# Patient Record
Sex: Female | Born: 1950 | Race: White | Hispanic: Yes | Smoking: Former smoker
Health system: Southern US, Community
[De-identification: ages and names within clinical notes are randomized; demographics above are authoritative.]

## PROBLEM LIST (undated history)

## (undated) DIAGNOSIS — M1990 Unspecified osteoarthritis, unspecified site: Secondary | ICD-10-CM

## (undated) HISTORY — PX: BREAST SURGERY: SHX581

## (undated) HISTORY — PX: TUBAL LIGATION: SHX77

## (undated) MED FILL — Fam-Trastuzumab Deruxtecan-nxki For IV Soln 100 MG: INTRAVENOUS | Qty: 12 | Status: AC

## (undated) MED FILL — Dexamethasone Sodium Phosphate Inj 100 MG/10ML: INTRAMUSCULAR | Qty: 1 | Status: AC

## (undated) MED FILL — Fam-Trastuzumab Deruxtecan-nxki For IV Soln 100 MG: INTRAVENOUS | Qty: 15 | Status: AC

## (undated) MED FILL — Ferric Carboxymaltose IV Soln 750 MG/15ML (Fe Equivalent): INTRAVENOUS | Qty: 15 | Status: AC

---

## 2013-10-29 DIAGNOSIS — C3490 Malignant neoplasm of unspecified part of unspecified bronchus or lung: Secondary | ICD-10-CM

## 2013-10-29 HISTORY — PX: BREAST LUMPECTOMY: SHX2

## 2013-10-29 HISTORY — DX: Malignant neoplasm of unspecified part of unspecified bronchus or lung: C34.90

## 2015-08-04 DIAGNOSIS — C7802 Secondary malignant neoplasm of left lung: Secondary | ICD-10-CM

## 2015-08-04 DIAGNOSIS — C7801 Secondary malignant neoplasm of right lung: Secondary | ICD-10-CM

## 2015-08-04 DIAGNOSIS — C50919 Malignant neoplasm of unspecified site of unspecified female breast: Secondary | ICD-10-CM

## 2015-08-25 DIAGNOSIS — C7800 Secondary malignant neoplasm of unspecified lung: Secondary | ICD-10-CM

## 2015-08-25 DIAGNOSIS — C50919 Malignant neoplasm of unspecified site of unspecified female breast: Secondary | ICD-10-CM

## 2015-09-15 DIAGNOSIS — C7800 Secondary malignant neoplasm of unspecified lung: Secondary | ICD-10-CM

## 2015-09-15 DIAGNOSIS — C50919 Malignant neoplasm of unspecified site of unspecified female breast: Secondary | ICD-10-CM

## 2015-09-15 DIAGNOSIS — Z170 Estrogen receptor positive status [ER+]: Secondary | ICD-10-CM

## 2015-10-13 DIAGNOSIS — C7800 Secondary malignant neoplasm of unspecified lung: Secondary | ICD-10-CM

## 2015-10-13 DIAGNOSIS — C50919 Malignant neoplasm of unspecified site of unspecified female breast: Secondary | ICD-10-CM

## 2015-11-24 DIAGNOSIS — C7800 Secondary malignant neoplasm of unspecified lung: Secondary | ICD-10-CM

## 2015-11-24 DIAGNOSIS — R05 Cough: Secondary | ICD-10-CM

## 2015-11-24 DIAGNOSIS — C50412 Malignant neoplasm of upper-outer quadrant of left female breast: Secondary | ICD-10-CM

## 2015-12-15 DIAGNOSIS — C50919 Malignant neoplasm of unspecified site of unspecified female breast: Secondary | ICD-10-CM

## 2015-12-15 DIAGNOSIS — Z170 Estrogen receptor positive status [ER+]: Secondary | ICD-10-CM

## 2015-12-15 DIAGNOSIS — C7800 Secondary malignant neoplasm of unspecified lung: Secondary | ICD-10-CM

## 2016-01-05 DIAGNOSIS — C50919 Malignant neoplasm of unspecified site of unspecified female breast: Secondary | ICD-10-CM

## 2016-01-05 DIAGNOSIS — C7800 Secondary malignant neoplasm of unspecified lung: Secondary | ICD-10-CM

## 2016-01-26 DIAGNOSIS — C7801 Secondary malignant neoplasm of right lung: Secondary | ICD-10-CM

## 2016-01-26 DIAGNOSIS — C7802 Secondary malignant neoplasm of left lung: Secondary | ICD-10-CM

## 2016-01-26 DIAGNOSIS — F329 Major depressive disorder, single episode, unspecified: Secondary | ICD-10-CM

## 2016-01-26 DIAGNOSIS — M545 Low back pain: Secondary | ICD-10-CM

## 2016-01-26 DIAGNOSIS — C50411 Malignant neoplasm of upper-outer quadrant of right female breast: Secondary | ICD-10-CM

## 2016-03-15 DIAGNOSIS — C50919 Malignant neoplasm of unspecified site of unspecified female breast: Secondary | ICD-10-CM

## 2016-03-15 DIAGNOSIS — C7800 Secondary malignant neoplasm of unspecified lung: Secondary | ICD-10-CM

## 2016-04-05 DIAGNOSIS — C50919 Malignant neoplasm of unspecified site of unspecified female breast: Secondary | ICD-10-CM

## 2016-04-05 DIAGNOSIS — R51 Headache: Secondary | ICD-10-CM

## 2016-05-17 DIAGNOSIS — C50412 Malignant neoplasm of upper-outer quadrant of left female breast: Secondary | ICD-10-CM

## 2016-05-17 DIAGNOSIS — C7800 Secondary malignant neoplasm of unspecified lung: Secondary | ICD-10-CM

## 2016-06-07 DIAGNOSIS — C7801 Secondary malignant neoplasm of right lung: Secondary | ICD-10-CM

## 2016-06-07 DIAGNOSIS — C50919 Malignant neoplasm of unspecified site of unspecified female breast: Secondary | ICD-10-CM

## 2016-06-07 DIAGNOSIS — C7802 Secondary malignant neoplasm of left lung: Secondary | ICD-10-CM

## 2016-07-05 DIAGNOSIS — C7801 Secondary malignant neoplasm of right lung: Secondary | ICD-10-CM

## 2016-07-05 DIAGNOSIS — C7802 Secondary malignant neoplasm of left lung: Secondary | ICD-10-CM

## 2016-07-05 DIAGNOSIS — C50412 Malignant neoplasm of upper-outer quadrant of left female breast: Secondary | ICD-10-CM

## 2016-07-05 DIAGNOSIS — D509 Iron deficiency anemia, unspecified: Secondary | ICD-10-CM

## 2016-08-16 DIAGNOSIS — C7801 Secondary malignant neoplasm of right lung: Secondary | ICD-10-CM

## 2016-08-16 DIAGNOSIS — C50412 Malignant neoplasm of upper-outer quadrant of left female breast: Secondary | ICD-10-CM

## 2016-08-16 DIAGNOSIS — C7802 Secondary malignant neoplasm of left lung: Secondary | ICD-10-CM

## 2016-09-06 DIAGNOSIS — C7802 Secondary malignant neoplasm of left lung: Secondary | ICD-10-CM

## 2016-09-06 DIAGNOSIS — C7801 Secondary malignant neoplasm of right lung: Secondary | ICD-10-CM

## 2016-09-06 DIAGNOSIS — C50412 Malignant neoplasm of upper-outer quadrant of left female breast: Secondary | ICD-10-CM

## 2016-10-04 DIAGNOSIS — C50412 Malignant neoplasm of upper-outer quadrant of left female breast: Secondary | ICD-10-CM

## 2016-10-04 DIAGNOSIS — C7802 Secondary malignant neoplasm of left lung: Secondary | ICD-10-CM

## 2016-10-04 DIAGNOSIS — C7801 Secondary malignant neoplasm of right lung: Secondary | ICD-10-CM

## 2016-10-25 DIAGNOSIS — C50412 Malignant neoplasm of upper-outer quadrant of left female breast: Secondary | ICD-10-CM

## 2016-10-25 DIAGNOSIS — C7801 Secondary malignant neoplasm of right lung: Secondary | ICD-10-CM

## 2016-10-25 DIAGNOSIS — C7802 Secondary malignant neoplasm of left lung: Secondary | ICD-10-CM

## 2016-11-23 DIAGNOSIS — C7801 Secondary malignant neoplasm of right lung: Secondary | ICD-10-CM

## 2016-11-23 DIAGNOSIS — C7802 Secondary malignant neoplasm of left lung: Secondary | ICD-10-CM

## 2016-11-23 DIAGNOSIS — C50919 Malignant neoplasm of unspecified site of unspecified female breast: Secondary | ICD-10-CM

## 2016-12-14 DIAGNOSIS — C7800 Secondary malignant neoplasm of unspecified lung: Secondary | ICD-10-CM

## 2016-12-14 DIAGNOSIS — G4700 Insomnia, unspecified: Secondary | ICD-10-CM

## 2016-12-14 DIAGNOSIS — C50919 Malignant neoplasm of unspecified site of unspecified female breast: Secondary | ICD-10-CM

## 2017-01-04 DIAGNOSIS — C50919 Malignant neoplasm of unspecified site of unspecified female breast: Secondary | ICD-10-CM

## 2017-01-04 DIAGNOSIS — Z170 Estrogen receptor positive status [ER+]: Secondary | ICD-10-CM

## 2017-01-04 DIAGNOSIS — C7800 Secondary malignant neoplasm of unspecified lung: Secondary | ICD-10-CM

## 2017-01-24 DIAGNOSIS — C7800 Secondary malignant neoplasm of unspecified lung: Secondary | ICD-10-CM

## 2017-01-24 DIAGNOSIS — C50919 Malignant neoplasm of unspecified site of unspecified female breast: Secondary | ICD-10-CM

## 2017-02-15 DIAGNOSIS — C50919 Malignant neoplasm of unspecified site of unspecified female breast: Secondary | ICD-10-CM

## 2017-02-15 DIAGNOSIS — C7800 Secondary malignant neoplasm of unspecified lung: Secondary | ICD-10-CM

## 2017-03-08 DIAGNOSIS — C7800 Secondary malignant neoplasm of unspecified lung: Secondary | ICD-10-CM

## 2017-03-08 DIAGNOSIS — Z170 Estrogen receptor positive status [ER+]: Secondary | ICD-10-CM

## 2017-03-08 DIAGNOSIS — C50412 Malignant neoplasm of upper-outer quadrant of left female breast: Secondary | ICD-10-CM

## 2017-04-26 DIAGNOSIS — Z170 Estrogen receptor positive status [ER+]: Secondary | ICD-10-CM

## 2017-04-26 DIAGNOSIS — C50412 Malignant neoplasm of upper-outer quadrant of left female breast: Secondary | ICD-10-CM

## 2017-04-26 DIAGNOSIS — C7800 Secondary malignant neoplasm of unspecified lung: Secondary | ICD-10-CM

## 2017-05-17 DIAGNOSIS — C50412 Malignant neoplasm of upper-outer quadrant of left female breast: Secondary | ICD-10-CM

## 2017-05-17 DIAGNOSIS — Z170 Estrogen receptor positive status [ER+]: Secondary | ICD-10-CM

## 2017-05-17 DIAGNOSIS — C7800 Secondary malignant neoplasm of unspecified lung: Secondary | ICD-10-CM

## 2017-07-19 DIAGNOSIS — Z170 Estrogen receptor positive status [ER+]: Secondary | ICD-10-CM

## 2017-07-19 DIAGNOSIS — C50412 Malignant neoplasm of upper-outer quadrant of left female breast: Secondary | ICD-10-CM

## 2017-07-19 DIAGNOSIS — C7800 Secondary malignant neoplasm of unspecified lung: Secondary | ICD-10-CM

## 2017-07-30 DIAGNOSIS — C50412 Malignant neoplasm of upper-outer quadrant of left female breast: Secondary | ICD-10-CM

## 2017-08-09 DIAGNOSIS — C7800 Secondary malignant neoplasm of unspecified lung: Secondary | ICD-10-CM

## 2017-08-09 DIAGNOSIS — C50412 Malignant neoplasm of upper-outer quadrant of left female breast: Secondary | ICD-10-CM

## 2017-09-06 DIAGNOSIS — C50919 Malignant neoplasm of unspecified site of unspecified female breast: Secondary | ICD-10-CM

## 2017-09-06 DIAGNOSIS — C7800 Secondary malignant neoplasm of unspecified lung: Secondary | ICD-10-CM

## 2017-10-04 DIAGNOSIS — C50919 Malignant neoplasm of unspecified site of unspecified female breast: Secondary | ICD-10-CM

## 2017-10-04 DIAGNOSIS — C7800 Secondary malignant neoplasm of unspecified lung: Secondary | ICD-10-CM

## 2017-10-25 DIAGNOSIS — C7800 Secondary malignant neoplasm of unspecified lung: Secondary | ICD-10-CM

## 2017-10-25 DIAGNOSIS — C50919 Malignant neoplasm of unspecified site of unspecified female breast: Secondary | ICD-10-CM

## 2017-11-06 DIAGNOSIS — I517 Cardiomegaly: Secondary | ICD-10-CM

## 2017-11-06 DIAGNOSIS — C50412 Malignant neoplasm of upper-outer quadrant of left female breast: Secondary | ICD-10-CM

## 2017-11-22 DIAGNOSIS — C50919 Malignant neoplasm of unspecified site of unspecified female breast: Secondary | ICD-10-CM

## 2017-12-13 DIAGNOSIS — C50919 Malignant neoplasm of unspecified site of unspecified female breast: Secondary | ICD-10-CM

## 2017-12-13 DIAGNOSIS — C7800 Secondary malignant neoplasm of unspecified lung: Secondary | ICD-10-CM

## 2018-01-03 DIAGNOSIS — C50919 Malignant neoplasm of unspecified site of unspecified female breast: Secondary | ICD-10-CM

## 2018-01-03 DIAGNOSIS — C7800 Secondary malignant neoplasm of unspecified lung: Secondary | ICD-10-CM

## 2018-01-31 DIAGNOSIS — C7800 Secondary malignant neoplasm of unspecified lung: Secondary | ICD-10-CM

## 2018-01-31 DIAGNOSIS — C50919 Malignant neoplasm of unspecified site of unspecified female breast: Secondary | ICD-10-CM

## 2018-02-06 DIAGNOSIS — I517 Cardiomegaly: Secondary | ICD-10-CM

## 2018-02-06 DIAGNOSIS — C50412 Malignant neoplasm of upper-outer quadrant of left female breast: Secondary | ICD-10-CM

## 2018-02-21 DIAGNOSIS — C7800 Secondary malignant neoplasm of unspecified lung: Secondary | ICD-10-CM

## 2018-02-21 DIAGNOSIS — C50919 Malignant neoplasm of unspecified site of unspecified female breast: Secondary | ICD-10-CM

## 2018-02-21 DIAGNOSIS — R590 Localized enlarged lymph nodes: Secondary | ICD-10-CM

## 2018-04-04 DIAGNOSIS — C7800 Secondary malignant neoplasm of unspecified lung: Secondary | ICD-10-CM

## 2018-04-04 DIAGNOSIS — C50919 Malignant neoplasm of unspecified site of unspecified female breast: Secondary | ICD-10-CM

## 2018-04-25 DIAGNOSIS — C50919 Malignant neoplasm of unspecified site of unspecified female breast: Secondary | ICD-10-CM

## 2018-04-25 DIAGNOSIS — C7800 Secondary malignant neoplasm of unspecified lung: Secondary | ICD-10-CM

## 2018-05-08 DIAGNOSIS — C50412 Malignant neoplasm of upper-outer quadrant of left female breast: Secondary | ICD-10-CM

## 2018-05-08 DIAGNOSIS — I517 Cardiomegaly: Secondary | ICD-10-CM

## 2018-05-16 DIAGNOSIS — C50919 Malignant neoplasm of unspecified site of unspecified female breast: Secondary | ICD-10-CM

## 2018-05-16 DIAGNOSIS — C7800 Secondary malignant neoplasm of unspecified lung: Secondary | ICD-10-CM

## 2018-06-06 DIAGNOSIS — C7800 Secondary malignant neoplasm of unspecified lung: Secondary | ICD-10-CM

## 2018-06-06 DIAGNOSIS — C50919 Malignant neoplasm of unspecified site of unspecified female breast: Secondary | ICD-10-CM

## 2018-06-27 DIAGNOSIS — C7800 Secondary malignant neoplasm of unspecified lung: Secondary | ICD-10-CM

## 2018-06-27 DIAGNOSIS — C50919 Malignant neoplasm of unspecified site of unspecified female breast: Secondary | ICD-10-CM

## 2018-08-04 DIAGNOSIS — C50412 Malignant neoplasm of upper-outer quadrant of left female breast: Secondary | ICD-10-CM

## 2018-08-29 DIAGNOSIS — C7800 Secondary malignant neoplasm of unspecified lung: Secondary | ICD-10-CM

## 2018-08-29 DIAGNOSIS — C50919 Malignant neoplasm of unspecified site of unspecified female breast: Secondary | ICD-10-CM

## 2018-09-19 DIAGNOSIS — C7800 Secondary malignant neoplasm of unspecified lung: Secondary | ICD-10-CM

## 2018-09-19 DIAGNOSIS — R590 Localized enlarged lymph nodes: Secondary | ICD-10-CM

## 2018-09-19 DIAGNOSIS — C50919 Malignant neoplasm of unspecified site of unspecified female breast: Secondary | ICD-10-CM

## 2018-10-20 DIAGNOSIS — C771 Secondary and unspecified malignant neoplasm of intrathoracic lymph nodes: Secondary | ICD-10-CM

## 2018-10-20 DIAGNOSIS — C50919 Malignant neoplasm of unspecified site of unspecified female breast: Secondary | ICD-10-CM

## 2018-11-14 DIAGNOSIS — C50919 Malignant neoplasm of unspecified site of unspecified female breast: Secondary | ICD-10-CM

## 2018-11-14 DIAGNOSIS — C771 Secondary and unspecified malignant neoplasm of intrathoracic lymph nodes: Secondary | ICD-10-CM

## 2018-11-14 DIAGNOSIS — L299 Pruritus, unspecified: Secondary | ICD-10-CM

## 2018-11-28 DIAGNOSIS — I361 Nonrheumatic tricuspid (valve) insufficiency: Secondary | ICD-10-CM

## 2018-11-28 DIAGNOSIS — I340 Nonrheumatic mitral (valve) insufficiency: Secondary | ICD-10-CM

## 2018-12-05 DIAGNOSIS — C50919 Malignant neoplasm of unspecified site of unspecified female breast: Secondary | ICD-10-CM

## 2018-12-05 DIAGNOSIS — N390 Urinary tract infection, site not specified: Secondary | ICD-10-CM

## 2018-12-05 DIAGNOSIS — K117 Disturbances of salivary secretion: Secondary | ICD-10-CM

## 2018-12-05 DIAGNOSIS — L299 Pruritus, unspecified: Secondary | ICD-10-CM

## 2018-12-05 DIAGNOSIS — C779 Secondary and unspecified malignant neoplasm of lymph node, unspecified: Secondary | ICD-10-CM

## 2018-12-26 DIAGNOSIS — C50919 Malignant neoplasm of unspecified site of unspecified female breast: Secondary | ICD-10-CM

## 2018-12-26 DIAGNOSIS — C779 Secondary and unspecified malignant neoplasm of lymph node, unspecified: Secondary | ICD-10-CM

## 2019-01-02 DIAGNOSIS — C778 Secondary and unspecified malignant neoplasm of lymph nodes of multiple regions: Secondary | ICD-10-CM

## 2019-01-02 DIAGNOSIS — C50919 Malignant neoplasm of unspecified site of unspecified female breast: Secondary | ICD-10-CM

## 2019-01-30 DIAGNOSIS — C50919 Malignant neoplasm of unspecified site of unspecified female breast: Secondary | ICD-10-CM

## 2019-01-30 DIAGNOSIS — C778 Secondary and unspecified malignant neoplasm of lymph nodes of multiple regions: Secondary | ICD-10-CM

## 2019-02-20 DIAGNOSIS — C778 Secondary and unspecified malignant neoplasm of lymph nodes of multiple regions: Secondary | ICD-10-CM

## 2019-02-20 DIAGNOSIS — C50919 Malignant neoplasm of unspecified site of unspecified female breast: Secondary | ICD-10-CM

## 2019-03-13 DIAGNOSIS — C50919 Malignant neoplasm of unspecified site of unspecified female breast: Secondary | ICD-10-CM

## 2019-03-13 DIAGNOSIS — C778 Secondary and unspecified malignant neoplasm of lymph nodes of multiple regions: Secondary | ICD-10-CM

## 2019-03-26 DIAGNOSIS — C50412 Malignant neoplasm of upper-outer quadrant of left female breast: Secondary | ICD-10-CM

## 2019-04-03 DIAGNOSIS — C50919 Malignant neoplasm of unspecified site of unspecified female breast: Secondary | ICD-10-CM

## 2019-04-03 DIAGNOSIS — C778 Secondary and unspecified malignant neoplasm of lymph nodes of multiple regions: Secondary | ICD-10-CM

## 2019-04-24 DIAGNOSIS — C778 Secondary and unspecified malignant neoplasm of lymph nodes of multiple regions: Secondary | ICD-10-CM

## 2019-04-24 DIAGNOSIS — C50919 Malignant neoplasm of unspecified site of unspecified female breast: Secondary | ICD-10-CM

## 2019-05-15 DIAGNOSIS — C50412 Malignant neoplasm of upper-outer quadrant of left female breast: Secondary | ICD-10-CM

## 2019-06-05 DIAGNOSIS — C7801 Secondary malignant neoplasm of right lung: Secondary | ICD-10-CM

## 2019-06-05 DIAGNOSIS — C7802 Secondary malignant neoplasm of left lung: Secondary | ICD-10-CM

## 2019-06-05 DIAGNOSIS — C50412 Malignant neoplasm of upper-outer quadrant of left female breast: Secondary | ICD-10-CM

## 2019-06-19 DIAGNOSIS — I361 Nonrheumatic tricuspid (valve) insufficiency: Secondary | ICD-10-CM

## 2019-06-26 DIAGNOSIS — C7802 Secondary malignant neoplasm of left lung: Secondary | ICD-10-CM

## 2019-06-26 DIAGNOSIS — C7801 Secondary malignant neoplasm of right lung: Secondary | ICD-10-CM

## 2019-06-26 DIAGNOSIS — C50412 Malignant neoplasm of upper-outer quadrant of left female breast: Secondary | ICD-10-CM

## 2019-07-17 DIAGNOSIS — C7801 Secondary malignant neoplasm of right lung: Secondary | ICD-10-CM

## 2019-07-17 DIAGNOSIS — C50412 Malignant neoplasm of upper-outer quadrant of left female breast: Secondary | ICD-10-CM

## 2019-07-17 DIAGNOSIS — C7802 Secondary malignant neoplasm of left lung: Secondary | ICD-10-CM

## 2019-08-07 DIAGNOSIS — C50412 Malignant neoplasm of upper-outer quadrant of left female breast: Secondary | ICD-10-CM

## 2019-08-28 DIAGNOSIS — C50412 Malignant neoplasm of upper-outer quadrant of left female breast: Secondary | ICD-10-CM

## 2019-08-28 DIAGNOSIS — C7800 Secondary malignant neoplasm of unspecified lung: Secondary | ICD-10-CM

## 2019-10-06 DIAGNOSIS — C50412 Malignant neoplasm of upper-outer quadrant of left female breast: Secondary | ICD-10-CM

## 2019-10-09 DIAGNOSIS — C50412 Malignant neoplasm of upper-outer quadrant of left female breast: Secondary | ICD-10-CM

## 2019-10-09 DIAGNOSIS — C7800 Secondary malignant neoplasm of unspecified lung: Secondary | ICD-10-CM

## 2019-11-20 DIAGNOSIS — C50412 Malignant neoplasm of upper-outer quadrant of left female breast: Secondary | ICD-10-CM

## 2019-12-11 DIAGNOSIS — C50412 Malignant neoplasm of upper-outer quadrant of left female breast: Secondary | ICD-10-CM

## 2019-12-11 DIAGNOSIS — C7801 Secondary malignant neoplasm of right lung: Secondary | ICD-10-CM

## 2019-12-11 DIAGNOSIS — C7802 Secondary malignant neoplasm of left lung: Secondary | ICD-10-CM

## 2020-01-01 DIAGNOSIS — C7802 Secondary malignant neoplasm of left lung: Secondary | ICD-10-CM

## 2020-01-01 DIAGNOSIS — C50412 Malignant neoplasm of upper-outer quadrant of left female breast: Secondary | ICD-10-CM

## 2020-01-01 DIAGNOSIS — C7801 Secondary malignant neoplasm of right lung: Secondary | ICD-10-CM

## 2020-02-09 DIAGNOSIS — C50412 Malignant neoplasm of upper-outer quadrant of left female breast: Secondary | ICD-10-CM

## 2020-02-09 DIAGNOSIS — T451X1D Poisoning by antineoplastic and immunosuppressive drugs, accidental (unintentional), subsequent encounter: Secondary | ICD-10-CM

## 2020-02-12 DIAGNOSIS — C7801 Secondary malignant neoplasm of right lung: Secondary | ICD-10-CM

## 2020-02-12 DIAGNOSIS — C50412 Malignant neoplasm of upper-outer quadrant of left female breast: Secondary | ICD-10-CM

## 2020-02-12 DIAGNOSIS — C7802 Secondary malignant neoplasm of left lung: Secondary | ICD-10-CM

## 2020-03-10 DIAGNOSIS — C50412 Malignant neoplasm of upper-outer quadrant of left female breast: Secondary | ICD-10-CM

## 2020-06-03 DIAGNOSIS — T451X1D Poisoning by antineoplastic and immunosuppressive drugs, accidental (unintentional), subsequent encounter: Secondary | ICD-10-CM

## 2020-06-03 DIAGNOSIS — C50412 Malignant neoplasm of upper-outer quadrant of left female breast: Secondary | ICD-10-CM

## 2020-06-10 DIAGNOSIS — C50412 Malignant neoplasm of upper-outer quadrant of left female breast: Secondary | ICD-10-CM

## 2020-07-15 ENCOUNTER — Encounter: Payer: Self-pay | Admitting: Oncology

## 2020-07-15 DIAGNOSIS — C50412 Malignant neoplasm of upper-outer quadrant of left female breast: Secondary | ICD-10-CM | POA: Insufficient documentation

## 2020-08-02 NOTE — Progress Notes (Signed)
Patient has been approved for drug replacement program by [place] for [facility]. The enrollment period is from 01/09/20 to indefinitely. First DOS covered is: TBA.

## 2020-09-06 ENCOUNTER — Other Ambulatory Visit: Payer: Self-pay | Admitting: Hematology and Oncology

## 2020-09-06 DIAGNOSIS — Z170 Estrogen receptor positive status [ER+]: Secondary | ICD-10-CM

## 2020-09-06 DIAGNOSIS — C50412 Malignant neoplasm of upper-outer quadrant of left female breast: Secondary | ICD-10-CM

## 2020-09-13 ENCOUNTER — Telehealth: Payer: Self-pay | Admitting: Oncology

## 2020-09-13 NOTE — Telephone Encounter (Signed)
Patient's daughter called to CX 11/19 Labs, Follow up due to patient being out-of-town until 12/1

## 2020-09-16 ENCOUNTER — Ambulatory Visit: Payer: Self-pay | Admitting: Oncology

## 2020-09-16 ENCOUNTER — Other Ambulatory Visit: Payer: Self-pay

## 2020-10-07 ENCOUNTER — Telehealth: Payer: Self-pay | Admitting: Oncology

## 2020-10-07 NOTE — Telephone Encounter (Signed)
Patient's daughter called to Reschedule 11/19 to Nov 14, 2020 ([place] was [place]of[place])

## 2020-11-03 ENCOUNTER — Other Ambulatory Visit: Payer: Self-pay | Admitting: Oncology

## 2020-11-03 DIAGNOSIS — Z170 Estrogen receptor positive status [ER+]: Secondary | ICD-10-CM

## 2020-11-03 DIAGNOSIS — C50412 Malignant neoplasm of upper-outer quadrant of left female breast: Secondary | ICD-10-CM

## 2020-11-03 NOTE — Progress Notes (Unsigned)
[facility]  [address] [place],  [place]  [address] [phone]  Clinic Day:  11/04/2020  Referring physician: Marguerita Merles, MD   HISTORY OF PRESENT ILLNESS:  The patient is a 70 y.o. 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  She last received her 24th cycle of TDM-1 in late August 2021.  Since that time, the patient has spent the last few months out [place] with her family.  She comes back in today to re-establish her follow-up and disease surveillance.  Since being off of her TDM-1 therapy for these past few months, the patient has felt okay.  She still complains of intermittent headaches and intermittent back discomfort.  Both issues are chronic in nature, with previous scans showing no evidence of disease recurrence.  With respect to her metastatic breast cancer, she denies having any new symptoms or findings which concern her for overt signs of disease progression.   PHYSICAL EXAM:  Blood pressure 133/85, pulse 79, temperature 97.8 F (36.6 C), resp. rate 16, height 5' 6.5" (1.689 m), weight 151 lb 11.2 oz (68.8 kg), SpO2 98 %. Wt Readings from Last 3 Encounters:  11/04/20 151 lb 11.2 oz (68.8 kg)   Body mass index is 24.12 kg/m. Performance status (ECOG): 1 - Symptomatic but completely ambulatory Physical Exam Constitutional:      Appearance: Normal appearance.  HENT:     Mouth/Throat:     Pharynx: Oropharynx is clear. No oropharyngeal exudate.  Cardiovascular:     Rate and Rhythm: Normal rate and regular rhythm.     Heart sounds: No murmur heard. No friction rub. No gallop.   Pulmonary:     Breath sounds: Normal breath sounds.  Chest:  Breasts:     Right: No swelling, bleeding, inverted nipple, mass, nipple discharge, skin change, axillary adenopathy or supraclavicular adenopathy.     Left: No swelling, bleeding, inverted nipple, mass, nipple discharge, skin  change, axillary adenopathy or supraclavicular adenopathy.    Abdominal:     General: Bowel sounds are normal. There is no distension.     Palpations: Abdomen is soft. There is no mass.     Tenderness: There is no abdominal tenderness.  Musculoskeletal:        General: No tenderness.     Cervical back: Normal range of motion and neck supple.     Right lower leg: No edema.     Left lower leg: No edema.  Lymphadenopathy:     Cervical: No cervical adenopathy.     Right cervical: No superficial, deep or posterior cervical adenopathy.    Left cervical: No superficial, deep or posterior cervical adenopathy.     Upper Body:     Right upper body: No supraclavicular or axillary adenopathy.     Left upper body: No supraclavicular or axillary adenopathy.     Lower Body: No right inguinal adenopathy. No left inguinal adenopathy.  Skin:    Coloration: Skin is not jaundiced.     Findings: No lesion or rash.  Neurological:     General: No focal deficit present.     Mental Status: She is alert and oriented to person, place, and time. Mental status is at baseline.  Psychiatric:        Mood and Affect: Mood normal.        Behavior: Behavior normal.        Thought Content: Thought content normal.  Judgment: Judgment normal.     LABS:    ASSESSMENT & PLAN:  Assessment/Plan:  A 70 y.o. 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  Despite being off any form of treatment for the past 4+ months, she appears to be doing well.  I will have her undergo a chest and head CT in the forthcoming week to ascertain her new disease baseline.  I will see her back the following day to go over these images and their implications, as well as to determine if resuming therapy is necessary.  The patient understands all the plans discussed today and is in agreement with them.     Vaida Kerchner Macarthur Critchley, MD

## 2020-11-04 ENCOUNTER — Other Ambulatory Visit: Payer: Self-pay

## 2020-11-04 ENCOUNTER — Inpatient Hospital Stay: Payer: Self-pay

## 2020-11-04 ENCOUNTER — Telehealth: Payer: Self-pay | Admitting: Oncology

## 2020-11-04 ENCOUNTER — Other Ambulatory Visit: Payer: Self-pay | Admitting: Hematology and Oncology

## 2020-11-04 ENCOUNTER — Other Ambulatory Visit: Payer: Self-pay | Admitting: Oncology

## 2020-11-04 ENCOUNTER — Inpatient Hospital Stay: Payer: Self-pay | Attending: Oncology | Admitting: Oncology

## 2020-11-04 VITALS — BP 133/85 | HR 79 | Temp 97.8°F | Resp 16 | Ht 66.5 in | Wt 151.7 lb

## 2020-11-04 DIAGNOSIS — C50412 Malignant neoplasm of upper-outer quadrant of left female breast: Secondary | ICD-10-CM

## 2020-11-04 DIAGNOSIS — Z171 Estrogen receptor negative status [ER-]: Secondary | ICD-10-CM

## 2020-11-04 LAB — BASIC METABOLIC PANEL
BUN: 15 (ref 4–21)
CO2: 25 — AB (ref 13–22)
Chloride: 105 (ref 99–108)
Creatinine: 0.6 (ref 0.5–1.1)
Glucose: 99
Potassium: 4 (ref 3.4–5.3)
Sodium: 138 (ref 137–147)

## 2020-11-04 LAB — CBC AND DIFFERENTIAL
HCT: 41 (ref 36–46)
Hemoglobin: 13.7 (ref 12.0–16.0)
Neutrophils Absolute: 2.3
Platelets: 188 (ref 150–399)
WBC: 4.1

## 2020-11-04 LAB — COMPREHENSIVE METABOLIC PANEL
Albumin: 4.4 (ref 3.5–5.0)
Calcium: 10.1 (ref 8.7–10.7)

## 2020-11-04 LAB — HEPATIC FUNCTION PANEL
ALT: 43 — AB (ref 7–35)
AST: 49 — AB (ref 13–35)
Alkaline Phosphatase: 117 (ref 25–125)
Bilirubin, Total: 0.6

## 2020-11-04 LAB — CBC: RBC: 4.49 (ref 3.87–5.11)

## 2020-11-04 NOTE — Telephone Encounter (Signed)
Per 1/7 LOS, patient scheduled for 11/09/20 CT Chest/Brain - 1/14 Follow Up at 11:45 am

## 2020-11-08 ENCOUNTER — Encounter: Payer: Self-pay | Admitting: Oncology

## 2020-11-10 NOTE — Progress Notes (Signed)
[name]  [address] [place],  [place]  [address] [phone]  Clinic Day:  11/04/2020  Referring physician: Marguerita Merles, MD   HISTORY OF PRESENT ILLNESS:  The patient is a 70 y.o. 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  She last received her 24th cycle of TDM-1 in late August 2021.  Since that time, the patient has spent the last few months out [place] with her family.  She comes back in today to re-establish her follow-up and disease surveillance.  Since being off of her TDM-1 therapy for these past few months, the patient has felt okay.  She still complains of intermittent headaches and intermittent back discomfort.  Both issues are chronic in nature.  With respect to her metastatic breast cancer, she denies having any new symptoms or findings which concern her for overt signs of disease progression.  She presents to clinic today for review of recent CT imaging of the chest and head. CT chest reveals small unchanged pulmonary nodules, likely benign and borderline enlarged subcarinal and right hilar lymph nodes are slightly increased in size in the interval may reflect reactive change. These do not meet CT criteria for adenopathy. Attention on follow up imaging is advised. CT head reveals new probable cystic/ solid lesion of the left cerebellum with surrounding edema but no significant mass effect.   She denies fever, chills, nausea or vomiting. She denies cough, chest pain or shortness of breath. She denies issue with bowel or bladder.   PHYSICAL EXAM:  There were no vitals taken for this visit. Wt Readings from Last 3 Encounters:  11/04/20 151 lb 11.2 oz (68.8 kg)   There is no height or weight on file to calculate BMI. Performance status (ECOG): 1 - Symptomatic but completely ambulatory Physical Exam Constitutional:      General: She is not in acute distress.     Appearance: Normal appearance. She is normal weight. She is not ill-appearing, toxic-appearing or diaphoretic.  HENT:     Head: Normocephalic and atraumatic.     Right Ear: Tympanic membrane normal.     Left Ear: Tympanic membrane normal.     Nose: Nose normal. No congestion or rhinorrhea.     Mouth/Throat:     Mouth: Mucous membranes are moist.     Pharynx: Oropharynx is clear. No oropharyngeal exudate or posterior oropharyngeal erythema.  Eyes:     General: No scleral icterus.       Right eye: No discharge.        Left eye: No discharge.     Extraocular Movements: Extraocular movements intact.     Conjunctiva/sclera: Conjunctivae normal.     Pupils: Pupils are equal, round, and reactive to light.  Neck:     Vascular: No carotid bruit.  Cardiovascular:     Rate and Rhythm: Normal rate and regular rhythm.     Heart sounds: No murmur heard. No friction rub. No gallop.   Pulmonary:     Effort: Pulmonary effort is normal. No respiratory distress.     Breath sounds: Normal breath sounds. No stridor. No wheezing, rhonchi or rales.  Chest:     Chest wall: No tenderness.  Abdominal:     General: Abdomen is flat. Bowel sounds are normal. There is no distension.     Palpations: There is no mass.     Tenderness: There is no abdominal tenderness. There is no  right CVA tenderness, left CVA tenderness, guarding or rebound.     Hernia: No hernia is present.  Musculoskeletal:        General: No swelling, tenderness, deformity or signs of injury. Normal range of motion.     Cervical back: Normal range of motion and neck supple. No rigidity or tenderness.     Right lower leg: No edema.     Left lower leg: No edema.  Lymphadenopathy:     Cervical: No cervical adenopathy.  Skin:    General: Skin is warm and dry.     Capillary Refill: Capillary refill takes less than 2 seconds.     Coloration: Skin is not jaundiced or pale.     Findings: No bruising, erythema, lesion or rash.  Neurological:      General: No focal deficit present.     Mental Status: She is alert and oriented to person, place, and time. Mental status is at baseline.     Cranial Nerves: No cranial nerve deficit.     Sensory: No sensory deficit.     Motor: No weakness.     Coordination: Coordination normal.     Gait: Gait normal.     Deep Tendon Reflexes: Reflexes normal.  Psychiatric:        Mood and Affect: Mood normal.        Behavior: Behavior normal.        Thought Content: Thought content normal.        Judgment: Judgment normal.     LABS:    ASSESSMENT & PLAN:  Assessment/Plan:  A 70 y.o. 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 Despite being off any form of treatment for the past 4+ months, she appears to be doing well. Review of her CT imaging reveals stable disease in the chest and a probable cystic/ solid lesion of the left cerebellum. We will obtain MRI of the brain with and without contrast for further evaluation. She will return to clinic once imaging has been obtained for discussion with Dr. Bobby Rumpf regarding future planning.   The patient verbalizes understanding of and agreement to the plans discussed today. She knows to call the office with any new questions or concerns.  Melodye Ped, NP

## 2020-11-11 ENCOUNTER — Ambulatory Visit: Payer: Self-pay | Admitting: Oncology

## 2020-11-11 ENCOUNTER — Inpatient Hospital Stay (HOSPITAL_BASED_OUTPATIENT_CLINIC_OR_DEPARTMENT_OTHER): Payer: Self-pay | Admitting: Hematology and Oncology

## 2020-11-11 ENCOUNTER — Encounter: Payer: Self-pay | Admitting: Hematology and Oncology

## 2020-11-11 ENCOUNTER — Telehealth: Payer: Self-pay | Admitting: Oncology

## 2020-11-11 VITALS — BP 142/76 | HR 77 | Temp 98.4°F | Resp 16 | Ht 66.5 in | Wt 150.7 lb

## 2020-11-11 DIAGNOSIS — Z171 Estrogen receptor negative status [ER-]: Secondary | ICD-10-CM

## 2020-11-11 DIAGNOSIS — C50412 Malignant neoplasm of upper-outer quadrant of left female breast: Secondary | ICD-10-CM

## 2020-11-11 NOTE — Progress Notes (Signed)
Patient brought in her renewal paperwork for financial assistance through [facility], she also brought in an application for [facility]. I will submit both for her.

## 2020-11-28 ENCOUNTER — Ambulatory Visit: Payer: Self-pay | Admitting: Oncology

## 2020-11-28 ENCOUNTER — Other Ambulatory Visit: Payer: Self-pay

## 2021-01-12 ENCOUNTER — Encounter: Payer: Self-pay | Admitting: Hematology and Oncology

## 2021-01-15 NOTE — Progress Notes (Signed)
[name]  [address] [place],  [place]  [address] [phone]  Clinic Day:  01/16/2021  Referring physician: Marguerita Merles, MD   HISTORY OF PRESENT ILLNESS:  The patient is a 70 y.o. 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  She last received her 24th cycle of TDM-1 in late August 2021.  For multiple months, the patient spent time out [place] with her family.  When she returned earlier this year, a head CT showed a new probable cystic/ solid lesion of the left cerebellum with surrounding edema but no significant mass effect.   The plan was for her to receive a brain MRI as quickly as possible, but it did not get done until recently.  She comes in today to go over her brain MRI images and their implications.  Since her last visit, she has been doing okay.  She still has occasional headaches, but does not claim they are particularly any different or worse than previously.    PHYSICAL EXAM:  Blood pressure 132/79, pulse 85, temperature 98.4 F (36.9 C), resp. rate 14, height 5' 6.5" (1.689 m), weight 152 lb 4.8 oz (69.1 kg), SpO2 96 %. Wt Readings from Last 3 Encounters:  01/16/21 152 lb 4.8 oz (69.1 kg)  11/11/20 150 lb 11.2 oz (68.4 kg)  11/04/20 151 lb 11.2 oz (68.8 kg)   Body mass index is 24.21 kg/m. Performance status (ECOG): 1 - Symptomatic but completely ambulatory Physical Exam Constitutional:      General: She is not in acute distress.    Appearance: Normal appearance. She is normal weight. She is not ill-appearing, toxic-appearing or diaphoretic.  HENT:     Head: Normocephalic and atraumatic.     Right Ear: Tympanic membrane normal.     Left Ear: Tympanic membrane normal.     Nose: Nose normal. No congestion or rhinorrhea.     Mouth/Throat:     Mouth: Mucous membranes are moist.     Pharynx: Oropharynx is clear. No oropharyngeal exudate or posterior  oropharyngeal erythema.  Eyes:     General: No scleral icterus.       Right eye: No discharge.        Left eye: No discharge.     Extraocular Movements: Extraocular movements intact.     Conjunctiva/sclera: Conjunctivae normal.     Pupils: Pupils are equal, round, and reactive to light.  Neck:     Vascular: No carotid bruit.  Cardiovascular:     Rate and Rhythm: Normal rate and regular rhythm.     Heart sounds: No murmur heard. No friction rub. No gallop.   Pulmonary:     Effort: Pulmonary effort is normal. No respiratory distress.     Breath sounds: Normal breath sounds. No stridor. No wheezing, rhonchi or rales.  Chest:     Chest wall: No tenderness.  Abdominal:     General: Abdomen is flat. Bowel sounds are normal. There is no distension.     Palpations: There is no mass.     Tenderness: There is no abdominal tenderness. There is no right CVA tenderness, left CVA tenderness, guarding or rebound.     Hernia: No hernia is present.  Musculoskeletal:        General: No swelling, tenderness, deformity or signs of injury. Normal range of motion.     Cervical back: Normal range of motion and neck supple. No rigidity or  tenderness.     Right lower leg: No edema.     Left lower leg: No edema.  Lymphadenopathy:     Cervical: No cervical adenopathy.  Skin:    General: Skin is warm and dry.     Capillary Refill: Capillary refill takes less than 2 seconds.     Coloration: Skin is not jaundiced or pale.     Findings: No bruising, erythema, lesion or rash.  Neurological:     General: No focal deficit present.     Mental Status: She is alert and oriented to person, place, and time. Mental status is at baseline.     Cranial Nerves: No cranial nerve deficit.     Sensory: No sensory deficit.     Motor: No weakness.     Coordination: Coordination normal.     Gait: Gait normal.     Deep Tendon Reflexes: Reflexes normal.  Psychiatric:        Mood and Affect: Mood normal.        Behavior:  Behavior normal.        Thought Content: Thought content normal.        Judgment: Judgment normal.    SCANS:  Her brain MRI done last week revealed the following: FINDINGS: Brain: Left upper cerebellar mass with multiple rim enhancing cystic components and a irregular dominant nodular component superiorly. The total mass measures up to 4.5 x 3.8 x 2.7 cm. Moderate adjacent edematous appearance with narrowing of the upper fourth ventricle but no hydrocephalus at this time. No second lesion is seen.  No infarct, collection, brain atrophy.  Vascular: Normal flow voids and vascular enhancements.  Skull and upper cervical spine: No noted bony metastasis.  Sinuses/Orbits: Negative  IMPRESSION: 1. 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 2. Upper fourth ventricular narrowing without hydrocephalus at this time.  ASSESSMENT & PLAN:  Assessment/Plan:  A 70 y.o. 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  In clinic today, I went over her brain MRI images with her and her family, for which they could see the large cerebellar metastasis. I discussed her case with neurosurgery this afternoon, with the hopes they can see her and surgically resect this likely metastatic lesion.  AS there appears to be no significant edema and she has no prominent CNS symptoms, I will not place her on steroids.  Neurosurgery will see her in the forthcoming days to formulate her next course of action.  I will tentatively see her back in 6 weeks for repeat clinical assessment.  Although somewhat despondent, the patient understands all the plans discussed today and is in agreement with them.    Marquelle Musgrave Macarthur Critchley, MD

## 2021-01-16 ENCOUNTER — Other Ambulatory Visit: Payer: Self-pay

## 2021-01-16 ENCOUNTER — Inpatient Hospital Stay: Payer: Self-pay

## 2021-01-16 ENCOUNTER — Telehealth: Payer: Self-pay | Admitting: Oncology

## 2021-01-16 ENCOUNTER — Other Ambulatory Visit: Payer: Self-pay | Admitting: Oncology

## 2021-01-16 ENCOUNTER — Inpatient Hospital Stay: Payer: Self-pay | Attending: Oncology | Admitting: Oncology

## 2021-01-16 VITALS — BP 132/79 | HR 85 | Temp 98.4°F | Resp 14 | Ht 66.5 in | Wt 152.3 lb

## 2021-01-16 DIAGNOSIS — C50412 Malignant neoplasm of upper-outer quadrant of left female breast: Secondary | ICD-10-CM

## 2021-01-16 DIAGNOSIS — C7931 Secondary malignant neoplasm of brain: Secondary | ICD-10-CM

## 2021-01-16 DIAGNOSIS — Z171 Estrogen receptor negative status [ER-]: Secondary | ICD-10-CM

## 2021-01-16 DIAGNOSIS — C50911 Malignant neoplasm of unspecified site of right female breast: Secondary | ICD-10-CM

## 2021-01-16 NOTE — Telephone Encounter (Signed)
Per 3/21 LOS, patient scheduled for May Appt's.  Gave patient Appt Summary

## 2021-01-24 DIAGNOSIS — D496 Neoplasm of unspecified behavior of brain: Secondary | ICD-10-CM | POA: Diagnosis present

## 2021-01-25 ENCOUNTER — Other Ambulatory Visit: Payer: Self-pay | Admitting: Neurosurgery

## 2021-01-25 DIAGNOSIS — C50919 Malignant neoplasm of unspecified site of unspecified female breast: Secondary | ICD-10-CM | POA: Insufficient documentation

## 2021-02-01 NOTE — Pre-Procedure Instructions (Signed)
Surgical Instructions:    Your procedure is scheduled on Monday 02/06/21 (07:30 AM- 11:00 AM).  Report to [facility] Main Entrance "A" at 05:30 A.M., then check in with the Admitting office.  Call this number if you have any questions prior to, or have any problems the morning of surgery:  [phone]    Remember:  Do not eat or drink after midnight the night before your surgery.    Take these medicines the morning of surgery with A SIP OF WATER: NONE    As of today, STOP taking any Aspirin (unless otherwise instructed by your surgeon), naproxen sodium (ALEVE), Naproxen, Ibuprofen, Motrin, Advil, Goody's, BC's, all herbal medications, fish oil, and all vitamins.              Special instructions:   [facility]- Preparing For Surgery  Before surgery, you can play an important role. Because skin is not sterile, your skin needs to be as free of germs as possible. You can reduce the number of germs on your skin by washing with CHG (chlorahexidine gluconate) Soap before surgery.  CHG is an antiseptic cleaner which kills germs and bonds with the skin to continue killing germs even after washing.    Oral Hygiene is also important to reduce your risk of infection.  Remember - BRUSH YOUR TEETH THE MORNING OF SURGERY WITH YOUR REGULAR TOOTHPASTE  Please do not use if you have an allergy to CHG or antibacterial soaps. If your skin becomes reddened/irritated stop using the CHG.  Do not shave (including legs and underarms) for at least 48 hours prior to first CHG shower. It is OK to shave your face.  Please follow these instructions carefully.   1. Shower the NIGHT BEFORE SURGERY and the MORNING OF SURGERY  2. If you chose to wash your hair, wash your hair first as usual with your normal shampoo.  3. After you shampoo, rinse your hair and body thoroughly to remove the shampoo.  4. Wash Face and genitals (private parts) with your normal soap.   5. Use CHG Soap as you would any other  liquid soap. You can apply CHG directly to the skin and wash gently with a scrungie or a clean washcloth.   6. Apply the CHG Soap to your body ONLY FROM THE NECK DOWN.  Do not use on open wounds or open sores. Avoid contact with your eyes, ears, mouth and genitals (private parts). Wash Face and genitals (private parts)  with your normal soap.   7. Wash thoroughly, paying special attention to the area where your surgery will be performed.  8. Thoroughly rinse your body with warm water from the neck down.  9. DO NOT shower/wash with your normal soap after using and rinsing off the CHG Soap.  10. Pat yourself dry with a CLEAN TOWEL.  11. Wear CLEAN PAJAMAS to bed the night before surgery.  12. Place CLEAN SHEETS on your bed the night before your surgery.  13. DO NOT SLEEP WITH PETS.   Day of Surgery: SHOWER with CHG soap. Brush your teeth WITH YOUR REGULAR TOOTHPASTE. Wear Clean/Comfortable clothing the morning of surgery. Do not apply any deodorants/lotions.   Do not wear jewelry, make up, or nail polish. Do not shave 48 hours prior to surgery.    Do NOT Smoke (Tobacco/Vaping) or drink Alcohol 24 hours prior to your procedure. Do not bring valuables to the hospital. [facility] is not responsible for any belongings or valuables.  If you use  a CPAP at night, you may bring all equipment for your overnight stay.   Contacts, glasses, or dentures may not be worn into surgery, please bring cases for these belongings.   For patients admitted to the hospital, discharge time will be determined by your treatment team.   Patients discharged the day of surgery will not be allowed to drive home, and someone needs to stay with them for 24 hours.    Please read over the following fact sheets that you were given.

## 2021-02-02 ENCOUNTER — Encounter (HOSPITAL_COMMUNITY)
Admission: RE | Admit: 2021-02-02 | Discharge: 2021-02-02 | Disposition: A | Discharge status: Home / Self Care | Payer: Self-pay | Source: Ambulatory Visit | Attending: Neurosurgery | Admitting: Neurosurgery

## 2021-02-02 ENCOUNTER — Other Ambulatory Visit: Payer: Self-pay

## 2021-02-02 ENCOUNTER — Encounter (HOSPITAL_COMMUNITY): Payer: Self-pay

## 2021-02-02 DIAGNOSIS — Z01812 Encounter for preprocedural laboratory examination: Secondary | ICD-10-CM | POA: Insufficient documentation

## 2021-02-02 DIAGNOSIS — Z20822 Contact with and (suspected) exposure to covid-19: Secondary | ICD-10-CM | POA: Insufficient documentation

## 2021-02-02 HISTORY — DX: Unspecified osteoarthritis, unspecified site: M19.90

## 2021-02-02 LAB — CBC
HCT: 45 % (ref 36.0–46.0)
Hemoglobin: 14.7 g/dL (ref 12.0–15.0)
MCH: 30.6 pg (ref 26.0–34.0)
MCHC: 32.7 g/dL (ref 30.0–36.0)
MCV: 93.6 fL (ref 80.0–100.0)
Platelets: 209 10*3/uL (ref 150–400)
RBC: 4.81 MIL/uL (ref 3.87–5.11)
RDW: 13.3 % (ref 11.5–15.5)
WBC: 4.6 10*3/uL (ref 4.0–10.5)
nRBC: 0 % (ref 0.0–0.2)

## 2021-02-02 LAB — TYPE AND SCREEN
ABO/RH(D): O POS
Antibody Screen: NEGATIVE

## 2021-02-02 LAB — SARS CORONAVIRUS 2 (TAT 6-24 HRS): SARS Coronavirus 2: NEGATIVE

## 2021-02-02 NOTE — Progress Notes (Addendum)
PCP - Delight Stare, MD Cardiologist - Denies Oncologist- Lavera Guise, MD  PPM/ICD - Denies  Chest x-ray - N/A EKG - N/A Stress Test - Denies ECHO - 06/03/2020 at [facility]; record provided by daughter, [facility] Cardiac Cath - Denies  Sleep Study - Denies   Patient denies being diabetic.  Blood Thinner Instructions: N/A Aspirin Instructions: N/A  ERAS Protcol - N/A PRE-SURGERY Ensure or G2- N/A  COVID TEST- 02/02/21; results pending   Anesthesia review: Yes, review ECHO  Patient denies shortness of breath, fever, cough and chest pain at PAT appointment   All instructions explained to the patient, with a verbal understanding of the material. Patient agrees to go over the instructions while at home for a better understanding. Patient also instructed to self quarantine after being tested for COVID-19. The opportunity to ask questions was provided.

## 2021-02-03 NOTE — Anesthesia Preprocedure Evaluation (Addendum)
Anesthesia Evaluation  Patient identified by MRN, date of birth, ID band Patient awake    Reviewed: Allergy & Precautions, H&P , NPO status , Patient's Chart, lab work & pertinent test results  Airway Mallampati: II  TM Distance: >3 FB Neck ROM: Full    Dental no notable dental hx. (+) Teeth Intact, Dental Advisory Given   Pulmonary neg pulmonary ROS, former smoker,    Pulmonary exam normal breath sounds clear to auscultation       Cardiovascular negative cardio ROS   Rhythm:Regular Rate:Normal     Neuro/Psych negative neurological ROS  negative psych ROS   GI/Hepatic negative GI ROS, Neg liver ROS,   Endo/Other  negative endocrine ROS  Renal/GU negative Renal ROS  negative genitourinary   Musculoskeletal  (+) Arthritis , Osteoarthritis,    Abdominal   Peds  Hematology negative hematology ROS (+)   Anesthesia Other Findings   Reproductive/Obstetrics negative OB ROS                           Anesthesia Physical Anesthesia Plan  ASA: III  Anesthesia Plan: General   Post-op Pain Management:    Induction: Intravenous  PONV Risk Score and Plan: 4 or greater and Ondansetron, Aprepitant and Dexamethasone  Airway Management Planned: Oral ETT  Additional Equipment: Arterial line  Intra-op Plan:   Post-operative Plan: Extubation in OR  Informed Consent: I have reviewed the patients History and Physical, chart, labs and discussed the procedure including the risks, benefits and alternatives for the proposed anesthesia with the patient or authorized representative who has indicated his/her understanding and acceptance.     Interpreter used for [employer] Discussed with: CRNA and Surgeon  Anesthesia Plan Comments: (TTE 11/04/2019 (copy on chart): Conclusions: 1.  Left ventricular wall thickness is normal. 2.  Overall left ventricular systolic function is normal with an EF  between 60-65%.  Normal global left ventricular longitudinal strain (-20.7%). 3.  The diastolic filling pattern is normal for the age of the patient. 4.  The right ventricle is normal in size and function. 5.  The left atrium is normal in size. 6.  The right atrium is normal in size and function. 7.  The aortic valve is trileaflet and appears structurally normal. 8.  There is mild aortic valve sclerosis. 9.  Normal-appearing mitral valve. 10.  There is trace mitral regurgitation. 11.  Trace tricuspid regurgitation is present. 12.  Trace/mild (physiologic) pulmonic regurgitation. 13.  The aortic root, ascending aorta and aortic arch appear normal. 14.  There is no pericardial effusion.)      Anesthesia Quick Evaluation

## 2021-02-06 ENCOUNTER — Encounter (HOSPITAL_COMMUNITY)
Admission: RE | Disposition: A | Discharge status: Rehabilitation Facility | Payer: Self-pay | Source: Home / Self Care | Attending: Neurosurgery

## 2021-02-06 ENCOUNTER — Inpatient Hospital Stay (HOSPITAL_COMMUNITY): Payer: Self-pay

## 2021-02-06 ENCOUNTER — Inpatient Hospital Stay (HOSPITAL_COMMUNITY)
Admission: RE | Admit: 2021-02-06 | Discharge: 2021-02-10 | DRG: 026 | Disposition: A | Discharge status: Rehabilitation Facility | Payer: Self-pay | Attending: Neurosurgery | Admitting: Neurosurgery

## 2021-02-06 ENCOUNTER — Inpatient Hospital Stay (HOSPITAL_COMMUNITY): Payer: Self-pay | Admitting: Physician Assistant

## 2021-02-06 ENCOUNTER — Encounter (HOSPITAL_COMMUNITY): Payer: Self-pay | Admitting: Neurosurgery

## 2021-02-06 ENCOUNTER — Other Ambulatory Visit: Payer: Self-pay

## 2021-02-06 DIAGNOSIS — Z6828 Body mass index (BMI) 28.0-28.9, adult: Secondary | ICD-10-CM

## 2021-02-06 DIAGNOSIS — Z9221 Personal history of antineoplastic chemotherapy: Secondary | ICD-10-CM

## 2021-02-06 DIAGNOSIS — C7802 Secondary malignant neoplasm of left lung: Secondary | ICD-10-CM | POA: Diagnosis present

## 2021-02-06 DIAGNOSIS — R260 Ataxic gait: Secondary | ICD-10-CM | POA: Diagnosis present

## 2021-02-06 DIAGNOSIS — C7931 Secondary malignant neoplasm of brain: Principal | ICD-10-CM | POA: Diagnosis present

## 2021-02-06 DIAGNOSIS — C7801 Secondary malignant neoplasm of right lung: Secondary | ICD-10-CM | POA: Diagnosis present

## 2021-02-06 DIAGNOSIS — D496 Neoplasm of unspecified behavior of brain: Secondary | ICD-10-CM | POA: Diagnosis present

## 2021-02-06 DIAGNOSIS — E663 Overweight: Secondary | ICD-10-CM | POA: Diagnosis present

## 2021-02-06 DIAGNOSIS — K5900 Constipation, unspecified: Secondary | ICD-10-CM | POA: Diagnosis present

## 2021-02-06 DIAGNOSIS — Z87891 Personal history of nicotine dependence: Secondary | ICD-10-CM

## 2021-02-06 DIAGNOSIS — C50912 Malignant neoplasm of unspecified site of left female breast: Secondary | ICD-10-CM | POA: Diagnosis present

## 2021-02-06 HISTORY — PX: APPLICATION OF CRANIAL NAVIGATION: SHX6578

## 2021-02-06 HISTORY — PX: CRANIOTOMY: SHX93

## 2021-02-06 LAB — POCT I-STAT 7, (LYTES, BLD GAS, ICA,H+H)
Acid-Base Excess: 2 mmol/L (ref 0.0–2.0)
Bicarbonate: 27.2 mmol/L (ref 20.0–28.0)
Calcium, Ion: 1.24 mmol/L (ref 1.15–1.40)
HCT: 37 % (ref 36.0–46.0)
Hemoglobin: 12.6 g/dL (ref 12.0–15.0)
O2 Saturation: 100 %
Potassium: 3.6 mmol/L (ref 3.5–5.1)
Sodium: 144 mmol/L (ref 135–145)
TCO2: 29 mmol/L (ref 22–32)
pCO2 arterial: 43.5 mmHg (ref 32.0–48.0)
pH, Arterial: 7.405 (ref 7.350–7.450)
pO2, Arterial: 257 mmHg — ABNORMAL HIGH (ref 83.0–108.0)

## 2021-02-06 LAB — BASIC METABOLIC PANEL
Anion gap: 5 (ref 5–15)
BUN: 14 mg/dL (ref 8–23)
CO2: 25 mmol/L (ref 22–32)
Calcium: 9.8 mg/dL (ref 8.9–10.3)
Chloride: 107 mmol/L (ref 98–111)
Creatinine, Ser: 0.81 mg/dL (ref 0.44–1.00)
GFR, Estimated: >60 mL/min (ref >60)
Glucose, Bld: 104 mg/dL — ABNORMAL HIGH (ref 70–99)
Potassium: 3.6 mmol/L (ref 3.5–5.1)
Sodium: 137 mmol/L (ref 135–145)

## 2021-02-06 LAB — ABO/RH: ABO/RH(D): O POS

## 2021-02-06 SURGERY — CRANIOTOMY TUMOR EXCISION
Anesthesia: General | Site: Head

## 2021-02-06 MED ORDER — ORAL CARE MOUTH RINSE: 15.0000 mL | Freq: Once | OROMUCOSAL | Status: AC

## 2021-02-06 MED ORDER — DEXAMETHASONE SODIUM PHOSPHATE 10 MG/ML IJ SOLN
INTRAMUSCULAR | Status: AC
  Filled 2021-02-06: qty 1

## 2021-02-06 MED ORDER — ACETAMINOPHEN 500 MG PO TABS
1000.0000 mg | ORAL_TABLET | Freq: Once | ORAL | Status: AC
  Administered 2021-02-06: 1000 mg via ORAL
  Filled 2021-02-06: qty 2

## 2021-02-06 MED ORDER — GLYCOPYRROLATE PF 0.2 MG/ML IJ SOSY
PREFILLED_SYRINGE | INTRAMUSCULAR | Status: AC
  Filled 2021-02-06: qty 1

## 2021-02-06 MED ORDER — ROCURONIUM BROMIDE 10 MG/ML (PF) SYRINGE
PREFILLED_SYRINGE | INTRAVENOUS | Status: DC | PRN
  Administered 2021-02-06: 60 mg via INTRAVENOUS
  Administered 2021-02-06: 20 mg via INTRAVENOUS
  Administered 2021-02-06: 30 mg via INTRAVENOUS
  Administered 2021-02-06: 20 mg via INTRAVENOUS
  Administered 2021-02-06: 30 mg via INTRAVENOUS

## 2021-02-06 MED ORDER — POTASSIUM CHLORIDE IN NACL 20-0.9 MEQ/L-% IV SOLN
INTRAVENOUS | Status: DC
  Filled 2021-02-06: qty 1000

## 2021-02-06 MED ORDER — THROMBIN 20000 UNITS EX SOLR: CUTANEOUS | Status: DC | PRN

## 2021-02-06 MED ORDER — SODIUM CHLORIDE 0.9 % IV SOLN: INTRAVENOUS | Status: DC | PRN

## 2021-02-06 MED ORDER — THROMBIN 5000 UNITS EX SOLR: OROMUCOSAL | Status: DC | PRN

## 2021-02-06 MED ORDER — PANTOPRAZOLE SODIUM 40 MG IV SOLR
40.0000 mg | Freq: Every day | INTRAVENOUS | Status: DC
  Administered 2021-02-06: 40 mg via INTRAVENOUS
  Filled 2021-02-06: qty 40

## 2021-02-06 MED ORDER — ONDANSETRON HCL 4 MG/2ML IJ SOLN
INTRAMUSCULAR | Status: DC | PRN
  Administered 2021-02-06: 4 mg via INTRAVENOUS

## 2021-02-06 MED ORDER — ONDANSETRON HCL 4 MG/2ML IJ SOLN
INTRAMUSCULAR | Status: AC
  Filled 2021-02-06: qty 2

## 2021-02-06 MED ORDER — PROMETHAZINE HCL 25 MG PO TABS
12.5000 mg | ORAL_TABLET | ORAL | Status: DC | PRN
  Filled 2021-02-06: qty 1

## 2021-02-06 MED ORDER — PROPOFOL 10 MG/ML IV BOLUS
INTRAVENOUS | Status: DC | PRN
  Administered 2021-02-06 (×2): 50 mg via INTRAVENOUS
  Administered 2021-02-06: 15 mg via INTRAVENOUS
  Administered 2021-02-06: 110 mg via INTRAVENOUS

## 2021-02-06 MED ORDER — CEFAZOLIN SODIUM-DEXTROSE 2-4 GM/100ML-% IV SOLN
2.0000 g | INTRAVENOUS | Status: AC
  Administered 2021-02-06: 2 g via INTRAVENOUS
  Filled 2021-02-06: qty 100

## 2021-02-06 MED ORDER — BACITRACIN ZINC 500 UNIT/GM EX OINT
TOPICAL_OINTMENT | CUTANEOUS | Status: DC | PRN
  Administered 2021-02-06: 1 via TOPICAL

## 2021-02-06 MED ORDER — PHENYLEPHRINE 40 MCG/ML (10ML) SYRINGE FOR IV PUSH (FOR BLOOD PRESSURE SUPPORT)
PREFILLED_SYRINGE | INTRAVENOUS | Status: DC | PRN
  Administered 2021-02-06: 100 ug via INTRAVENOUS
  Administered 2021-02-06 (×2): 120 ug via INTRAVENOUS
  Administered 2021-02-06: 100 ug via INTRAVENOUS
  Administered 2021-02-06 (×2): 80 ug via INTRAVENOUS

## 2021-02-06 MED ORDER — DEXAMETHASONE SODIUM PHOSPHATE 10 MG/ML IJ SOLN
6.0000 mg | Freq: Four times a day (QID) | INTRAMUSCULAR | Status: AC
  Administered 2021-02-06 – 2021-02-07 (×4): 6 mg via INTRAVENOUS
  Filled 2021-02-06 (×3): qty 1

## 2021-02-06 MED ORDER — ACETAMINOPHEN 650 MG RE SUPP: 650.0000 mg | RECTAL | Status: DC | PRN

## 2021-02-06 MED ORDER — FENTANYL CITRATE (PF) 100 MCG/2ML IJ SOLN
INTRAMUSCULAR | Status: DC | PRN
  Administered 2021-02-06 (×2): 50 ug via INTRAVENOUS

## 2021-02-06 MED ORDER — PROPOFOL 10 MG/ML IV BOLUS
INTRAVENOUS | Status: AC
  Filled 2021-02-06: qty 20

## 2021-02-06 MED ORDER — BUPIVACAINE-EPINEPHRINE (PF) 0.25% -1:200000 IJ SOLN
INTRAMUSCULAR | Status: AC
  Filled 2021-02-06: qty 30

## 2021-02-06 MED ORDER — ALBUMIN HUMAN 5 % IV SOLN: INTRAVENOUS | Status: DC | PRN

## 2021-02-06 MED ORDER — DEXAMETHASONE SODIUM PHOSPHATE 4 MG/ML IJ SOLN
4.0000 mg | Freq: Four times a day (QID) | INTRAMUSCULAR | Status: AC
  Administered 2021-02-07 – 2021-02-08 (×4): 4 mg via INTRAVENOUS
  Filled 2021-02-06 (×3): qty 1

## 2021-02-06 MED ORDER — ROCURONIUM BROMIDE 10 MG/ML (PF) SYRINGE
PREFILLED_SYRINGE | INTRAVENOUS | Status: AC
  Filled 2021-02-06: qty 10

## 2021-02-06 MED ORDER — CHLORHEXIDINE GLUCONATE 4 % EX LIQD: 60.0000 mL | Freq: Once | CUTANEOUS | Status: DC

## 2021-02-06 MED ORDER — ONDANSETRON HCL 4 MG/2ML IJ SOLN: 4.0000 mg | INTRAMUSCULAR | Status: DC | PRN

## 2021-02-06 MED ORDER — HYDROMORPHONE HCL 1 MG/ML IJ SOLN
0.2500 mg | INTRAMUSCULAR | Status: DC | PRN
Start: 2021-02-06 — End: 2021-02-06
  Administered 2021-02-06: 0.5 mg via INTRAVENOUS

## 2021-02-06 MED ORDER — GADOBUTROL 1 MMOL/ML IV SOLN
7.0000 mL | Freq: Once | INTRAVENOUS | Status: AC | PRN
  Administered 2021-02-06: 7 mL via INTRAVENOUS

## 2021-02-06 MED ORDER — THROMBIN 20000 UNITS EX SOLR
CUTANEOUS | Status: AC
  Filled 2021-02-06: qty 20000

## 2021-02-06 MED ORDER — SUGAMMADEX SODIUM 200 MG/2ML IV SOLN
INTRAVENOUS | Status: DC | PRN
  Administered 2021-02-06: 200 mg via INTRAVENOUS

## 2021-02-06 MED ORDER — MORPHINE SULFATE (PF) 2 MG/ML IV SOLN
1.0000 mg | INTRAVENOUS | Status: DC | PRN
  Administered 2021-02-06 – 2021-02-10 (×4): 2 mg via INTRAVENOUS
  Filled 2021-02-06 (×4): qty 1

## 2021-02-06 MED ORDER — LIDOCAINE HCL (CARDIAC) PF 100 MG/5ML IV SOSY
PREFILLED_SYRINGE | INTRAVENOUS | Status: DC | PRN
  Administered 2021-02-06: 60 mg via INTRAVENOUS

## 2021-02-06 MED ORDER — ACETAMINOPHEN 325 MG PO TABS
650.0000 mg | ORAL_TABLET | ORAL | Status: DC | PRN
  Administered 2021-02-07 (×2): 650 mg via ORAL
  Filled 2021-02-06 (×2): qty 2

## 2021-02-06 MED ORDER — ONDANSETRON HCL 4 MG PO TABS: 4.0000 mg | ORAL_TABLET | ORAL | Status: DC | PRN

## 2021-02-06 MED ORDER — FENTANYL CITRATE (PF) 250 MCG/5ML IJ SOLN
INTRAMUSCULAR | Status: AC
  Filled 2021-02-06: qty 5

## 2021-02-06 MED ORDER — HEMOSTATIC AGENTS (NO CHARGE) OPTIME
TOPICAL | Status: DC | PRN
  Administered 2021-02-06: 1 via TOPICAL

## 2021-02-06 MED ORDER — PHENYLEPHRINE HCL-NACL 10-0.9 MG/250ML-% IV SOLN
INTRAVENOUS | Status: DC | PRN
  Administered 2021-02-06: 20 ug/min via INTRAVENOUS

## 2021-02-06 MED ORDER — SODIUM CHLORIDE 0.9 % IV SOLN
0.0125 ug/kg/min | INTRAVENOUS | Status: DC
  Filled 2021-02-06: qty 2000

## 2021-02-06 MED ORDER — GLYCOPYRROLATE 0.2 MG/ML IJ SOLN
INTRAMUSCULAR | Status: DC | PRN
  Administered 2021-02-06: .2 mg via INTRAVENOUS

## 2021-02-06 MED ORDER — DEXAMETHASONE SODIUM PHOSPHATE 4 MG/ML IJ SOLN
4.0000 mg | Freq: Three times a day (TID) | INTRAMUSCULAR | Status: DC
  Administered 2021-02-08 – 2021-02-10 (×6): 4 mg via INTRAVENOUS
  Filled 2021-02-06 (×7): qty 1

## 2021-02-06 MED ORDER — DEXAMETHASONE SODIUM PHOSPHATE 10 MG/ML IJ SOLN
INTRAMUSCULAR | Status: DC | PRN
  Administered 2021-02-06: 10 mg via INTRAVENOUS

## 2021-02-06 MED ORDER — HYDROMORPHONE HCL 1 MG/ML IJ SOLN
INTRAMUSCULAR | Status: AC
  Filled 2021-02-06: qty 1

## 2021-02-06 MED ORDER — APREPITANT 40 MG PO CAPS
40.0000 mg | ORAL_CAPSULE | Freq: Once | ORAL | Status: AC
  Administered 2021-02-06: 40 mg via ORAL
  Filled 2021-02-06: qty 1

## 2021-02-06 MED ORDER — DEXAMETHASONE SODIUM PHOSPHATE 4 MG/ML IJ SOLN
INTRAMUSCULAR | Status: AC
  Filled 2021-02-06: qty 2

## 2021-02-06 MED ORDER — BUPIVACAINE-EPINEPHRINE (PF) 0.25% -1:200000 IJ SOLN
INTRAMUSCULAR | Status: DC | PRN
  Administered 2021-02-06: 10 mL

## 2021-02-06 MED ORDER — DEXMEDETOMIDINE (PRECEDEX) IN NS 20 MCG/5ML (4 MCG/ML) IV SYRINGE
PREFILLED_SYRINGE | INTRAVENOUS | Status: AC
  Filled 2021-02-06: qty 5

## 2021-02-06 MED ORDER — 0.9 % SODIUM CHLORIDE (POUR BTL) OPTIME
TOPICAL | Status: DC | PRN
  Administered 2021-02-06: 3000 mL

## 2021-02-06 MED ORDER — LACTATED RINGERS IV SOLN: INTRAVENOUS | Status: DC

## 2021-02-06 MED ORDER — DOCUSATE SODIUM 100 MG PO CAPS
100.0000 mg | ORAL_CAPSULE | Freq: Two times a day (BID) | ORAL | Status: DC
  Administered 2021-02-06 – 2021-02-10 (×8): 100 mg via ORAL
  Filled 2021-02-06 (×8): qty 1

## 2021-02-06 MED ORDER — CEFAZOLIN SODIUM-DEXTROSE 2-4 GM/100ML-% IV SOLN
2.0000 g | Freq: Three times a day (TID) | INTRAVENOUS | Status: AC
  Administered 2021-02-06 – 2021-02-07 (×2): 2 g via INTRAVENOUS
  Filled 2021-02-06 (×2): qty 100

## 2021-02-06 MED ORDER — HYDROCODONE-ACETAMINOPHEN 5-325 MG PO TABS
1.0000 | ORAL_TABLET | ORAL | Status: DC | PRN
  Administered 2021-02-07 – 2021-02-09 (×6): 2 via ORAL
  Filled 2021-02-06 (×6): qty 2

## 2021-02-06 MED ORDER — PROPOFOL 10 MG/ML IV BOLUS
INTRAVENOUS | Status: AC
  Filled 2021-02-06: qty 40

## 2021-02-06 MED ORDER — THROMBIN 5000 UNITS EX SOLR
CUTANEOUS | Status: AC
  Filled 2021-02-06: qty 5000

## 2021-02-06 MED ORDER — CHLORHEXIDINE GLUCONATE 0.12 % MT SOLN
15.0000 mL | Freq: Once | OROMUCOSAL | Status: AC
  Administered 2021-02-06: 15 mL via OROMUCOSAL
  Filled 2021-02-06: qty 15

## 2021-02-06 MED ORDER — SODIUM CHLORIDE 0.9 % IV SOLN
0.0125 ug/kg/min | INTRAVENOUS | Status: AC
  Administered 2021-02-06: .1 ug/kg/min via INTRAVENOUS
  Administered 2021-02-06: .2 ug/kg/min via INTRAVENOUS
  Filled 2021-02-06: qty 2000

## 2021-02-06 MED ORDER — CHLORHEXIDINE GLUCONATE CLOTH 2 % EX PADS
6.0000 | MEDICATED_PAD | Freq: Every day | CUTANEOUS | Status: DC
  Administered 2021-02-06: 6 via TOPICAL

## 2021-02-06 MED ORDER — EPHEDRINE SULFATE-NACL 50-0.9 MG/10ML-% IV SOSY
PREFILLED_SYRINGE | INTRAVENOUS | Status: DC | PRN
  Administered 2021-02-06 (×2): 10 mg via INTRAVENOUS

## 2021-02-06 MED ORDER — BACITRACIN ZINC 500 UNIT/GM EX OINT
TOPICAL_OINTMENT | CUTANEOUS | Status: AC
  Filled 2021-02-06: qty 28.35

## 2021-02-06 MED ORDER — LABETALOL HCL 5 MG/ML IV SOLN: 10.0000 mg | INTRAVENOUS | Status: DC | PRN

## 2021-02-06 SURGICAL SUPPLY — 67 items
BAND INSRT 18 STRL LF DISP RB (MISCELLANEOUS) ×4
BAND RUBBER #18 3X1/16 STRL (MISCELLANEOUS) ×6 IMPLANT
BIT DRILL WIRE PASS 1.3MM (BIT) ×2 IMPLANT
BLADE CLIPPER SPEC (BLADE) ×3 IMPLANT
BLADE ULTRA TIP 2M (BLADE) IMPLANT
BUR PRECISION FLUTE 6.0 (BURR) ×3 IMPLANT
BUR SPIRAL ROUTER 2.3 (BUR) ×3 IMPLANT
CANISTER SUCT 3000ML PPV (MISCELLANEOUS) ×6 IMPLANT
CARTRIDGE OIL MAESTRO DRILL (MISCELLANEOUS) ×2 IMPLANT
CNTNR URN SCR LID CUP LEK RST (MISCELLANEOUS) ×2 IMPLANT
CONT SPEC 4OZ STRL OR WHT (MISCELLANEOUS) ×3
DIFFUSER DRILL AIR PNEUMATIC (MISCELLANEOUS) ×3 IMPLANT
DRAPE MICROSCOPE LEICA (MISCELLANEOUS) ×3 IMPLANT
DRAPE NEUROLOGICAL W/INCISE (DRAPES) ×3 IMPLANT
DRAPE SURG 17X23 STRL (DRAPES) ×6 IMPLANT
DRAPE WARM FLUID 44X44 (DRAPES) ×3 IMPLANT
DRILL WIRE PASS 1.3MM (BIT) ×3
DRSG OPSITE POSTOP 4X6 (GAUZE/BANDAGES/DRESSINGS) ×3 IMPLANT
ELECT REM PT RETURN 9FT ADLT (ELECTROSURGICAL) ×3
ELECTRODE REM PT RTRN 9FT ADLT (ELECTROSURGICAL) ×2 IMPLANT
EVACUATOR 1/8 PVC DRAIN (DRAIN) IMPLANT
EVACUATOR SILICONE 100CC (DRAIN) IMPLANT
GAUZE 4X4 16PLY RFD (DISPOSABLE) IMPLANT
GLOVE BIO SURGEON STRL SZ8 (GLOVE) ×9 IMPLANT
GLOVE BIO SURGEON STRL SZ8.5 (GLOVE) ×6 IMPLANT
GLOVE INDICATOR 8.5 STRL (GLOVE) ×9 IMPLANT
GLOVE SURG PR MICRO ENCORE 7 (GLOVE) ×9 IMPLANT
GLOVE SURG PR MICRO ENCORE 7.5 (GLOVE) ×9 IMPLANT
GOWN STRL REUS W/ TWL LRG LVL3 (GOWN DISPOSABLE) ×4 IMPLANT
GOWN STRL REUS W/ TWL XL LVL3 (GOWN DISPOSABLE) ×4 IMPLANT
GOWN STRL REUS W/TWL 2XL LVL3 (GOWN DISPOSABLE) ×9 IMPLANT
GOWN STRL REUS W/TWL LRG LVL3 (GOWN DISPOSABLE) ×6
GOWN STRL REUS W/TWL XL LVL3 (GOWN DISPOSABLE) ×6
HEMOSTAT POWDER KIT SURGIFOAM (HEMOSTASIS) ×3 IMPLANT
HEMOSTAT SURGICEL 2X14 (HEMOSTASIS) ×3 IMPLANT
KIT BASIN OR (CUSTOM PROCEDURE TRAY) ×3 IMPLANT
KIT TURNOVER KIT B (KITS) ×3 IMPLANT
MARKER SKIN DUAL TIP RULER LAB (MISCELLANEOUS) ×3 IMPLANT
MARKER SPHERE PSV REFLC 13MM (MARKER) ×6 IMPLANT
NEEDLE HYPO 22GX1.5 SAFETY (NEEDLE) ×3 IMPLANT
NS IRRIG 1000ML POUR BTL (IV SOLUTION) ×9 IMPLANT
OIL CARTRIDGE MAESTRO DRILL (MISCELLANEOUS) ×3
PACK CRANIOTOMY CUSTOM (CUSTOM PROCEDURE TRAY) ×3 IMPLANT
PATTIES SURGICAL .25X.25 (GAUZE/BANDAGES/DRESSINGS) IMPLANT
PATTIES SURGICAL .5 X.5 (GAUZE/BANDAGES/DRESSINGS) ×3 IMPLANT
PATTIES SURGICAL .5 X3 (DISPOSABLE) ×6 IMPLANT
PATTIES SURGICAL 1X1 (DISPOSABLE) IMPLANT
PIN MAYFIELD SKULL DISP (PIN) ×3 IMPLANT
SPONGE NEURO XRAY DETECT 1X3 (DISPOSABLE) IMPLANT
SPONGE SURGIFOAM ABS GEL 100 (HEMOSTASIS) ×3 IMPLANT
STAPLER SKIN PROX WIDE 3.9 (STAPLE) ×3 IMPLANT
STOCKINETTE 6  STRL (DRAPES)
STOCKINETTE 6 STRL (DRAPES) IMPLANT
SUT ETHILON 3 0 FSL (SUTURE) IMPLANT
SUT ETHILON 3 0 PS 1 (SUTURE) IMPLANT
SUT NURALON 4 0 TR CR/8 (SUTURE) ×3 IMPLANT
SUT PROLENE 6 0 BV (SUTURE) IMPLANT
SUT SILK 0 TIES 10X30 (SUTURE) IMPLANT
SUT VIC AB 2-0 CP2 18 (SUTURE) ×3 IMPLANT
SUT VIC AB 3-0 FS2 27 (SUTURE) IMPLANT
SUT VICRYL 4-0 PS2 18IN ABS (SUTURE) IMPLANT
TOWEL GREEN STERILE (TOWEL DISPOSABLE) ×3 IMPLANT
TOWEL GREEN STERILE FF (TOWEL DISPOSABLE) ×3 IMPLANT
TRAY FOLEY MTR SLVR 16FR STAT (SET/KITS/TRAYS/PACK) ×3 IMPLANT
TUBE CONNECTING 12X1/4 (SUCTIONS) IMPLANT
UNDERPAD 30X36 HEAVY ABSORB (UNDERPADS AND DIAPERS) IMPLANT
WATER STERILE IRR 1000ML POUR (IV SOLUTION) ×3 IMPLANT

## 2021-02-06 NOTE — H&P (Signed)
Subjective: The patient is a 70 year old Hispanic female who has complained of headaches, nausea and unsteady gait.  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  I discussed the various treatment options with the patient and her daughter.  She has decided proceed with surgery after weighing the risk, benefits and alternatives.  Past Medical History:  Diagnosis Date  . Arthritis   . Lung cancer ([facility]) 2015    Past Surgical History:  Procedure Laterality Date  . BREAST LUMPECTOMY Left 2015  . BREAST SURGERY Left    tumor removal  . TUBAL LIGATION      No Known Allergies  Social History   Tobacco Use  . Smoking status: Former Smoker    Types: Cigarettes  . Smokeless tobacco: Never Used  . Tobacco comment: quit befjore 2015  Substance Use Topics  . Alcohol use: Not Currently    History reviewed. No pertinent family history. Prior to Admission medications   Medication Sig Start Date End Date Taking? Authorizing Provider  B Complex-C (B-COMPLEX WITH VITAMIN C) tablet Take 1 tablet by mouth daily after lunch.   Yes [provider]  Cholecalciferol (VITAMIN D3 PO) Take 1 tablet by mouth daily after lunch.   Yes [provider]  magnesium oxide (MAG-OX) 400 MG tablet Take 400 mg by mouth daily after lunch.   Yes [provider]  Misc Natural Products (TURMERIC CURCUMIN) CAPS Take 2 capsules by mouth daily after lunch.   Yes [provider]  naproxen sodium (ALEVE) 220 MG tablet Take 440 mg by mouth 2 (two) times daily as needed (pain).   Yes [provider]  Omega-3 Fatty Acids (OMEGA 3 PO) Take 2 capsules by mouth daily after lunch.   Yes [provider]  Potassium 99 MG TABS Take 99 mg by mouth daily after lunch.   Yes [provider]     Review of Systems  Positive ROS: As above  All other systems have been reviewed and were otherwise negative with the  exception of those mentioned in the HPI and as above.  Objective: Vital signs in last 24 hours: Temp:  [97.6 F (36.4 C)] 97.6 F (36.4 C) (04/11 0600) Pulse Rate:  [67] 67 (04/11 0600) Resp:  [18] 18 (04/11 0600) BP: (128)/(86) 128/86 (04/11 0600) SpO2:  [98 %] 98 % (04/11 0600) Weight:  [68.9 kg] 68.9 kg (04/11 0600) Estimated body mass index is 28.72 kg/m as calculated from the following:   Height as of this encounter: 5\' 1"  (1.549 m).   Weight as of this encounter: 68.9 kg.   General Appearance: Alert Head: Normocephalic, without obvious abnormality, atraumatic Eyes: PERRL, conjunctiva/corneas clear, EOM's intact,    Ears: Normal  Throat: Normal  Neck: Supple, Back: unremarkable Lungs: Clear to auscultation bilaterally, respirations unlabored Heart: Regular rate and rhythm, no murmur, rub or gallop Abdomen: Soft, non-tender Extremities: Extremities normal, atraumatic, no cyanosis or edema Skin: unremarkable  NEUROLOGIC:   Mental status: alert and oriented,Motor Exam - grossly normal Sensory Exam - grossly normal Reflexes:  Coordination - grossly normal Gait -mildly unsteady Balance - grossly normal Cranial Nerves: I: smell Not tested  II: visual acuity  OS: Normal  OD: Normal   II: visual fields Full to confrontation  II: pupils Equal, round, reactive to light  III,VII: ptosis None  III,IV,VI: extraocular muscles  Full ROM  V: mastication Normal  V: facial light touch sensation  Normal  V,VII: corneal reflex  Present  VII: facial muscle function - upper  Normal  VII: facial muscle function - lower Normal  VIII: hearing Not tested  IX: soft palate elevation  Normal  IX,X: gag reflex Present  XI: trapezius strength  5/5  XI: sternocleidomastoid strength 5/5  XI: neck flexion strength  5/5  XII: tongue strength  Normal    Data Review Lab Results  Component Value Date   WBC 4.6 02/02/2021   HGB 14.7 02/02/2021   HCT 45.0 02/02/2021   MCV 93.6  02/02/2021   PLT 209 02/02/2021   Lab Results  Component Value Date   NA 138 11/04/2020   K 4.0 11/04/2020   CL 105 11/04/2020   CO2 25 (A) 11/04/2020   BUN 15 11/04/2020   CREATININE 0.6 11/04/2020   No results found for: INR, PROTIME  Assessment/Plan: Left cerebellar tumor: I have discussed the situation with the patient and her daughter.  I have reviewed her imaging studies with him and pointed out the abnormalities.  We have discussed the various treatment options including surgery.  I have described the surgical treatment option of a suboccipital craniectomy with tumor resection.  We have discussed the risk, benefits, alternatives, expected postop course, and likelihood of achieving our goals with surgery.  I have answered all the questions.  She has decided proceed with surgery.   Ophelia Charter 02/06/2021 7:23 AM

## 2021-02-06 NOTE — Anesthesia Procedure Notes (Signed)
Procedure Name: Intubation Performed by: Griffin Dakin, CRNA Pre-anesthesia Checklist: Patient identified, Emergency Drugs available, Suction available and Patient being monitored Patient Re-evaluated:Patient Re-evaluated prior to induction Oxygen Delivery Method: Circle system utilized Preoxygenation: Pre-oxygenation with 100% oxygen Induction Type: IV induction Ventilation: Mask ventilation without difficulty Grade View: Grade I Tube type: Oral Tube size: 6.5 mm Number of attempts: 1 Airway Equipment and Method: Stylet Placement Confirmation: ETT inserted through vocal cords under direct vision,  positive ETCO2 and breath sounds checked- equal and bilateral Secured at: 20 cm Tube secured with: Tape Dental Injury: Teeth and Oropharynx as per pre-operative assessment

## 2021-02-06 NOTE — Transfer of Care (Signed)
Immediate Anesthesia Transfer of Care Note  Patient: Teresa [name]  Procedure(s) Performed: Suboccipital Craniectomy for Tumor Resection with Brainlab Navigation (N/A Head) APPLICATION OF CRANIAL NAVIGATION (N/A )  Patient Location: PACU  Anesthesia Type:General  Level of Consciousness: awake, alert  and patient cooperative  Airway & Oxygen Therapy: Patient Spontanous Breathing and Patient connected to face mask oxygen  Post-op Assessment: Report given to RN and Post -op Vital signs reviewed and stable  Post vital signs: Reviewed and stable  Last Vitals:  Vitals Value Taken Time  BP 116/72 02/06/21 1211  Temp    Pulse 84 02/06/21 1213  Resp 16 02/06/21 1213  SpO2 99 % 02/06/21 1213  Vitals shown include unvalidated device data.  Last Pain:  Vitals:   02/06/21 0600  TempSrc: Oral  PainSc: 1       Patients Stated Pain Goal: 4 (70/05/25 9102)  Complications: No complications documented.

## 2021-02-06 NOTE — Anesthesia Postprocedure Evaluation (Signed)
Anesthesia Post Note  Patient: Teresa [name]  Procedure(s) Performed: Suboccipital Craniectomy for Tumor Resection with Brainlab Navigation (N/A Head) APPLICATION OF CRANIAL NAVIGATION (N/A )     Patient location during evaluation: PACU Anesthesia Type: General Level of consciousness: awake and alert Pain management: pain level controlled Vital Signs Assessment: post-procedure vital signs reviewed and stable Respiratory status: spontaneous breathing, nonlabored ventilation, respiratory function stable and patient connected to nasal cannula oxygen Cardiovascular status: blood pressure returned to baseline and stable Postop Assessment: no apparent nausea or vomiting Anesthetic complications: no   No complications documented.  Last Vitals:  Vitals:   02/06/21 1311 02/06/21 1340  BP: 117/74 99/66  Pulse: 83 75  Resp: 15 13  Temp:  36.6 C  SpO2: 98% 96%    Last Pain:  Vitals:   02/06/21 1330  TempSrc:   PainSc: 6                  Ardys Hataway,W. EDMOND

## 2021-02-06 NOTE — Plan of Care (Signed)

## 2021-02-06 NOTE — Progress Notes (Signed)
Subjective: The patient is alert and attentive.  There is a language barrier.  She is in no apparent distress.  Objective: Vital signs in last 24 hours: Temp:  [97.6 F (36.4 C)-98.4 F (36.9 C)] 98.4 F (36.9 C) (04/11 1211) Pulse Rate:  [67-86] 79 (04/11 1226) Resp:  [17-21] 17 (04/11 1226) BP: (111-128)/(72-86) 111/72 (04/11 1226) SpO2:  [98 %-99 %] 99 % (04/11 1226) Arterial Line BP: (150)/(64) 150/64 (04/11 1226) Weight:  [68.9 kg] 68.9 kg (04/11 0600) Estimated body mass index is 28.72 kg/m as calculated from the following:   Height as of this encounter: 5\' 1"  (1.549 m).   Weight as of this encounter: 68.9 kg.   Intake/Output from previous day: No intake/output data recorded. Intake/Output this shift: Total I/O In: 1850 [I.V.:1600; IV Piggyback:250] Out: 1500 [Urine:1200; Blood:300]  Physical exam the patient is alert and attentive.  She follows commands.  She is moving all 4 extremities well.  Lab Results: No results for input(s): WBC, HGB, HCT, PLT in the last 72 hours. BMET Recent Labs    02/06/21 0541  NA 137  K 3.6  CL 107  CO2 25  GLUCOSE 104*  BUN 14  CREATININE 0.81  CALCIUM 9.8    Studies/Results: No results found.  Assessment/Plan: The patient is doing well.  I spoke with her daughter.  LOS: 0 days     Ophelia Charter 02/06/2021, 1:02 PM

## 2021-02-06 NOTE — Progress Notes (Signed)
Brought pt to MRI brain as previously ordered to be done tomorrow in AM , MRI staff aware and said not sure if they can do it tomorrow as ordered due to full schedule from out patient, explanation was made to pr daughter- agreed

## 2021-02-06 NOTE — Op Note (Signed)
Brief history: The patient is a 70 year old Hispanic female with a history of breast cancer.  She is developed headaches unsteady gait, etc.  She was worked up with a head CT and a brain MRI which demonstrated a left cerebellar lesion consistent with metastasis.  I discussed the various treatment options with her.  She has decided proceed with surgery after weighing the risk, benefits and alternatives.  Pre-op diagnosis: Left cerebellar tumor  Postop diagnosis: The same  Procedure: Left suboccipital craniectomy for gross total resection of left cerebellar tumor using micro dissection and BrainLab neuro navigation  Surgeon: Dr. Earle Gell  Assistant: Arnetha Massy, NP  Anesthesia: General tracheal  Estimated blood loss: 250 cc  Specimens: Tumor  Drains: None  Complications: None  Description of procedure: The patient was brought to the operating room by the anesthesia team.  General endotracheal anesthesia was induced.  I applied the [place] three-point headrest to her calvarium.  She was then turned to the prone position on the chest rolls.  We then entered the patient's coordinates into the [facility] computer.  The patient's suboccipital region was then shaved with clippers and prepared with Betadine scrub and Betadine solution.  Sterile drapes were applied.  I then injected the area to be incised with Marcaine with epinephrine solution.  I scalpel to make a left paramedian incision over the patient's tumor.  We used the cerebellar retractors for exposure.  We use electrocautery to expose the underlying calvarium.  I then used a high-speed drill to drill away the patient's left suboccipital bone, exposing the dura.  We exposed the caudal aspect of the left transverse sinus.  We then used the 15 blade scalpel and the [facility] elevator to create a cruciate durotomy.  We tacked up the dural edges.  We confirmed the tumor's location with the BrainLab neuro navigation.  I then used suction  and bipolar cautery to create a small corticotomy in the patient's left cerebellum over the tumor.  We dissected deeper and encountered tumor as expected at about a centimeter.  The tumor was not terribly distinct to the brain.  We brought the operative microscope into the field and under its medication and illumination completed the micro dissection.  I used the buddy halo for retractors.  I dissected circumferentially around the tumor.  We encountered a small bleeding vessels which we regulated with bipolar electrocautery and ligated with the microscissors.  We did encounter some cyst fluid which drained easily and provided more room to work with.  We entered into the cyst cavity and drained the cyst with suction.  We then continue to work circumferentially around the tumor using micro dissection, suction and bipolar electrocautery.  We obtained several specimens of the cyst wall for the pathologist.  We removed all the tumor we could see.  We confirmed the resection with the BrainLab neuro navigation.  We then obtained hemostasis using bipolar cautery.  We irrigated out the resection cavity until came back crystal-clear.  We then lined the tumor resection cavity with Surgicel.  We then remove the retractor.  We reapproximated the patient's dura with a 4-0 Nurolon suture.  We laid Gelfoam over the exposed dura.  We then remove the cerebellar retractor.  We reapproximated patient's cervical fascia with interrupted 0 Vicryl suture.  We reapproximated the subcutaneous tissue with interrupted 2-0 Vicryl suture.  We reapproximate the skin with stainless steel staples.  The wound was then coated with bacitracin ointment.  A sterile dressing was applied.  The drapes  were removed.  The patient was then returned to the supine position.  I then remove the [place] three-point headrest from her calvarium.  By report all sponge, instrument, and needle counts were correct at the end this case.

## 2021-02-07 ENCOUNTER — Encounter (HOSPITAL_COMMUNITY): Payer: Self-pay | Admitting: Neurosurgery

## 2021-02-07 LAB — CBC
HCT: 32.8 % — ABNORMAL LOW (ref 36.0–46.0)
Hemoglobin: 10.9 g/dL — ABNORMAL LOW (ref 12.0–15.0)
MCH: 30.9 pg (ref 26.0–34.0)
MCHC: 33.2 g/dL (ref 30.0–36.0)
MCV: 92.9 fL (ref 80.0–100.0)
Platelets: 157 10*3/uL (ref 150–400)
RBC: 3.53 MIL/uL — ABNORMAL LOW (ref 3.87–5.11)
RDW: 13.6 % (ref 11.5–15.5)
WBC: 10.3 10*3/uL (ref 4.0–10.5)
nRBC: 0 % (ref 0.0–0.2)

## 2021-02-07 LAB — BASIC METABOLIC PANEL
Anion gap: 8 (ref 5–15)
BUN: 12 mg/dL (ref 8–23)
CO2: 23 mmol/L (ref 22–32)
Calcium: 9.5 mg/dL (ref 8.9–10.3)
Chloride: 108 mmol/L (ref 98–111)
Creatinine, Ser: 0.58 mg/dL (ref 0.44–1.00)
GFR, Estimated: >60 mL/min (ref >60)
Glucose, Bld: 135 mg/dL — ABNORMAL HIGH (ref 70–99)
Potassium: 3.9 mmol/L (ref 3.5–5.1)
Sodium: 139 mmol/L (ref 135–145)

## 2021-02-07 MED ORDER — CHLORHEXIDINE GLUCONATE CLOTH 2 % EX PADS
6.0000 | MEDICATED_PAD | Freq: Every day | CUTANEOUS | Status: DC
  Administered 2021-02-07 – 2021-02-10 (×4): 6 via TOPICAL

## 2021-02-07 MED ORDER — PANTOPRAZOLE SODIUM 40 MG PO TBEC
40.0000 mg | DELAYED_RELEASE_TABLET | Freq: Every day | ORAL | Status: DC
  Administered 2021-02-07 – 2021-02-09 (×3): 40 mg via ORAL
  Filled 2021-02-07 (×3): qty 1

## 2021-02-07 NOTE — Progress Notes (Signed)
Received call from [facility] (Neurosurgery) informed him of pt's daughter report of pt's head being bump on CT machine while pt was still in ICU yesterday. No further questions. Will continue to monitor.

## 2021-02-07 NOTE — Plan of Care (Signed)

## 2021-02-07 NOTE — Progress Notes (Addendum)
Patient's daughter is at bedside and informed me that yesterday while patient was still in ICU pt's head was bumped on CT machine. Patient's daughter states that it got bumped while patient was being transferred over to [place] and ICU nurse saw this. Sent page to provider as an [place].

## 2021-02-07 NOTE — Progress Notes (Signed)
Subjective: The patient is alert and pleasant.  She tells me she is doing "very well".  Her daughter is at the bedside.  Objective: Vital signs in last 24 hours: Temp:  [97.6 F (36.4 C)-98.4 F (36.9 C)] 97.6 F (36.4 C) (04/12 0400) Pulse Rate:  [58-86] 66 (04/12 0600) Resp:  [10-27] 16 (04/12 0600) BP: (93-117)/(56-74) 109/66 (04/11 1900) SpO2:  [92 %-99 %] 93 % (04/12 0600) Arterial Line BP: (102-151)/(44-67) 131/62 (04/12 0600) Estimated body mass index is 28.72 kg/m as calculated from the following:   Height as of this encounter: 5\' 1"  (1.549 m).   Weight as of this encounter: 68.9 kg.   Intake/Output from previous day: 04/11 0701 - 04/12 0700 In: 3202.8 [P.O.:220; I.V.:2532.7; IV Piggyback:450.1] Out: 2580 [Urine:2280; Blood:300] Intake/Output this shift: No intake/output data recorded.  Physical exam the patient is alert and oriented.  She is moving all 4 extremities well.  Lab Results: Recent Labs    02/06/21 1012  HGB 12.6  HCT 37.0   BMET Recent Labs    02/06/21 0541 02/06/21 1012  NA 137 144  K 3.6 3.6  CL 107  --   CO2 25  --   GLUCOSE 104*  --   BUN 14  --   CREATININE 0.81  --   CALCIUM 9.8  --     Studies/Results: MR BRAIN W WO CONTRAST  Result Date: 02/07/2021 CLINICAL DATA:  Follow-up examination status post tumor resection. EXAM: MRI HEAD WITHOUT AND WITH CONTRAST TECHNIQUE: Multiplanar, multiecho pulse sequences of the brain and surrounding structures were obtained without and with intravenous contrast. CONTRAST:  8mL GADAVIST GADOBUTROL 1 MMOL/ML IV SOLN COMPARISON:  Prior MRI from 01/13/2021. FINDINGS: Brain: Postoperative changes from interval left suboccipital craniotomy for tumor resection are seen. Postoperative blood products seen throughout the resection cavity at the left cerebellum, with superimposed air-fluid level. The previously seen cystic tumor has largely been resected. Irregular serpiginous and patchy enhancement about the  resection cavity favored to in large part be postoperative in nature. No definite residual tumor on this immediate postcontrast examination. Residual vasogenic edema and mass effect within the left cerebellar hemisphere is relatively similar to preoperative exam. No infarct or other complication seen about the resection cavity. Postoperative pneumocephalus noted overlying the anterior frontal convexities as well. Remainder of the brain is otherwise stable. Mild cerebral white matter changes involving the supratentorial cerebral white matter noted, nonspecific, but most like related chronic microvascular ischemic disease. No evidence for acute infarct. No other acute intracranial hemorrhage. No other mass lesion or abnormal enhancement. No hydrocephalus or extra-axial collection. Vascular: Major intracranial vascular flow voids remain patent and well preserved. Normal flow voids seen within the major dural sinuses. Skull and upper cervical spine: Craniocervical junction within normal limits. Bone marrow signal intensity normal. Sequelae of interval left suboccipital craniotomy. Calvarium and scalp soft tissues otherwise unremarkable. Sinuses/Orbits: Globes and orbital soft tissues within normal limits. Paranasal sinuses remain largely clear. No significant mastoid effusion. Other: None. IMPRESSION: 1. Postoperative changes from interval left suboccipital craniotomy for tumor resection without complication or adverse features. Gross total resection of the left cerebellar mass, without definite residual tumor on this immediate postoperative examination. Residual vasogenic edema and mass effect on the adjacent fourth ventricle without hydrocephalus, similar to previous. 2. No other new acute intracranial abnormality. Electronically Signed   By: [name] M.D.   On: 02/07/2021 04:29    Assessment/Plan: Postop day #1: The patient's postop scan looks good.  She is  doing well.  I transferred her to  progressive.  She may go home tomorrow.  I have answered all their questions.  LOS: 1 day     Ophelia Charter 02/07/2021, 7:32 AM

## 2021-02-08 MED ORDER — POLYETHYLENE GLYCOL 3350 17 G PO PACK
17.0000 g | PACK | Freq: Two times a day (BID) | ORAL | Status: DC | PRN
  Administered 2021-02-08 – 2021-02-09 (×2): 17 g via ORAL
  Filled 2021-02-08 (×2): qty 1

## 2021-02-08 NOTE — Progress Notes (Signed)
Subjective: The patient is alert and pleasant.  Her daughter is at the bedside.  She complains of a mild headache.  She is unsteady walking.  Objective: Vital signs in last 24 hours: Temp:  [97.8 F (36.6 C)-98.2 F (36.8 C)] 97.8 F (36.6 C) (04/13 0837) Pulse Rate:  [65-81] 81 (04/13 0837) Resp:  [16-20] 20 (04/13 0837) BP: (101-110)/(60-68) 108/65 (04/13 0837) SpO2:  [91 %-94 %] 92 % (04/13 0837) Estimated body mass index is 28.72 kg/m as calculated from the following:   Height as of this encounter: 5\' 1"  (1.549 m).   Weight as of this encounter: 68.9 kg.   Intake/Output from previous day: 04/12 0701 - 04/13 0700 In: 148.5 [I.V.:148.5] Out: 225 [Urine:225] Intake/Output this shift: No intake/output data recorded.  Physical exam patient is alert and pleasant.  Her dressing has a small bloodstain.  She is moving all 4 extremities well.  Lab Results: Recent Labs    02/06/21 1012 02/07/21 0730  WBC  --  10.3  HGB 12.6 10.9*  HCT 37.0 32.8*  PLT  --  157   BMET Recent Labs    02/06/21 0541 02/06/21 1012 02/07/21 0730  NA 137 144 139  K 3.6 3.6 3.9  CL 107  --  108  CO2 25  --  23  GLUCOSE 104*  --  135*  BUN 14  --  12  CREATININE 0.81  --  0.58  CALCIUM 9.8  --  9.5    Studies/Results: MR BRAIN W WO CONTRAST  Result Date: 02/07/2021 CLINICAL DATA:  Follow-up examination status post tumor resection. EXAM: MRI HEAD WITHOUT AND WITH CONTRAST TECHNIQUE: Multiplanar, multiecho pulse sequences of the brain and surrounding structures were obtained without and with intravenous contrast. CONTRAST:  36mL GADAVIST GADOBUTROL 1 MMOL/ML IV SOLN COMPARISON:  Prior MRI from 01/13/2021. FINDINGS: Brain: Postoperative changes from interval left suboccipital craniotomy for tumor resection are seen. Postoperative blood products seen throughout the resection cavity at the left cerebellum, with superimposed air-fluid level. The previously seen cystic tumor has largely been resected.  Irregular serpiginous and patchy enhancement about the resection cavity favored to in large part be postoperative in nature. No definite residual tumor on this immediate postcontrast examination. Residual vasogenic edema and mass effect within the left cerebellar hemisphere is relatively similar to preoperative exam. No infarct or other complication seen about the resection cavity. Postoperative pneumocephalus noted overlying the anterior frontal convexities as well. Remainder of the brain is otherwise stable. Mild cerebral white matter changes involving the supratentorial cerebral white matter noted, nonspecific, but most like related chronic microvascular ischemic disease. No evidence for acute infarct. No other acute intracranial hemorrhage. No other mass lesion or abnormal enhancement. No hydrocephalus or extra-axial collection. Vascular: Major intracranial vascular flow voids remain patent and well preserved. Normal flow voids seen within the major dural sinuses. Skull and upper cervical spine: Craniocervical junction within normal limits. Bone marrow signal intensity normal. Sequelae of interval left suboccipital craniotomy. Calvarium and scalp soft tissues otherwise unremarkable. Sinuses/Orbits: Globes and orbital soft tissues within normal limits. Paranasal sinuses remain largely clear. No significant mastoid effusion. Other: None. IMPRESSION: 1. Postoperative changes from interval left suboccipital craniotomy for tumor resection without complication or adverse features. Gross total resection of the left cerebellar mass, without definite residual tumor on this immediate postoperative examination. Residual vasogenic edema and mass effect on the adjacent fourth ventricle without hydrocephalus, similar to previous. 2. No other new acute intracranial abnormality. Electronically Signed   By: [name]  [name] M.D.   On: 02/07/2021 04:29    Assessment/Plan: Postop day #2: The patient is progressing well.   She is a bit unsteady so she may benefit from PT, OT and rehab.  I will ask them to see the patient.  I have answered all the patient's, and her daughters, questions.  LOS: 2 days     Teresa [name] 02/08/2021, 10:51 AM

## 2021-02-08 NOTE — Progress Notes (Signed)
Physical medicine rehab consult requested chart reviewed.  Presently awaiting therapy evaluation to be completed after left suboccipital craniectomy.  Will complete formal consult pending evaluations.

## 2021-02-09 LAB — SURGICAL PATHOLOGY

## 2021-02-09 NOTE — Progress Notes (Signed)
Subjective: The patient is alert and pleasant.  Her daughter is at the bedside.  The patient looks well.  Objective: Vital signs in last 24 hours: Temp:  [97.6 F (36.4 C)-98.6 F (37 C)] 97.7 F (36.5 C) (04/14 0700) Pulse Rate:  [49-81] 50 (04/14 0700) Resp:  [18-20] 20 (04/14 0700) BP: (92-114)/(53-72) 109/64 (04/14 0700) SpO2:  [92 %-95 %] 93 % (04/14 0700) Estimated body mass index is 28.72 kg/m as calculated from the following:   Height as of this encounter: 5\' 1"  (1.549 m).   Weight as of this encounter: 68.9 kg.   Intake/Output from previous day: 04/13 0701 - 04/14 0700 In: 480 [P.O.:480] Out: -  Intake/Output this shift: No intake/output data recorded.  Physical exam the patient is alert and oriented.  She is moving all 4 extremities well.  Lab Results: Recent Labs    02/06/21 1012 02/07/21 0730  WBC  --  10.3  HGB 12.6 10.9*  HCT 37.0 32.8*  PLT  --  157   BMET Recent Labs    02/06/21 1012 02/07/21 0730  NA 144 139  K 3.6 3.9  CL  --  108  CO2  --  23  GLUCOSE  --  135*  BUN  --  12  CREATININE  --  0.58  CALCIUM  --  9.5    Studies/Results: No results found.  Assessment/Plan: Postop day #3: We are awaiting PT, OT and rehab.  I have answered all her questions.  LOS: 3 days     Teresa [name] 02/09/2021, 7:51 AM

## 2021-02-09 NOTE — Progress Notes (Signed)
Inpatient Rehab Admissions Coordinator:   Awaiting therapy evaluations for recommendations.   Shann Medal, PT, DPT Admissions Coordinator [phone] 02/09/21  12:08 PM

## 2021-02-09 NOTE — Evaluation (Signed)
Physical Therapy Evaluation Patient Details Name: Teresa [name] MRN: [id number] DOB: [birth year] Today's Date: 02/09/2021   History of Present Illness  The patient is a 70 year old Hispanic female with a history of breast cancer, arthritis and tobacco abuse.  She is developed headaches unsteady gait, etc.  She was worked up with a head CT and a brain MRI which demonstrated a left cerebellar lesion consistent with metastasis.  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  Clinical Impression  Patient presents with decreased mobility due to decreased balance, dizziness, decreased activity tolerance and generalized weakness.  She will benefit from skilled PT in the acute setting to allow return home with family support following CIR level rehab stay.  Daughter's home is two level and she plans to take pt to her house initially at d/c.      Follow Up Recommendations CIR;Supervision/Assistance - 24 hour    Equipment Recommendations  Rolling walker with 5" wheels    Recommendations for Other Services       Precautions / Restrictions Precautions Precautions: Fall      Mobility  Bed Mobility Overal bed mobility: Needs Assistance Bed Mobility: Supine to Sit     Supine to sit: Min assist;HOB elevated     General bed mobility comments: assist for lifting trunk    Transfers Overall transfer level: Needs assistance Equipment used: None Transfers: Sit to/from Stand Sit to Stand: Min assist         General transfer comment: assist for balance,  Ambulation/Gait Ambulation/Gait assistance: Min assist Gait Distance (Feet): 45 Feet Assistive device: Rolling walker (2 wheeled);1 person hand held assist Gait Pattern/deviations: Step-through pattern;Decreased stride length;Shuffle;Wide base of support     General Gait Details: initially with HHA, then pt very slow and reaching for another hand so stopped and got  walker, then with cues for proximity to walker and need for slow turns and visual target due to c/o dizziness  Stairs            Wheelchair Mobility    Modified Rankin (Stroke Patients Only)       Balance Overall balance assessment: Needs assistance   Sitting balance-Leahy Scale: Good     Standing balance support: No upper extremity supported Standing balance-Leahy Scale: Fair Standing balance comment: can stand without UE support, but for any movement, reaching for UE support                             Pertinent Vitals/Pain Pain Assessment: No/denies pain    Home Living Family/patient expects to be discharged to:: Private residence Living Arrangements: Children Available Help at Discharge: Family Type of Home: House Home Access: Stairs to enter Entrance Stairs-Rails: Right Entrance Stairs-Number of Steps: 15 Home Layout: Two level;Bed/bath upstairs Home Equipment: None      Prior Function Level of Independence: Independent               Hand Dominance        Extremity/Trunk Assessment   Upper Extremity Assessment Upper Extremity Assessment: Overall WFL for tasks assessed    Lower Extremity Assessment Lower Extremity Assessment: Generalized weakness       Communication   Communication: Interpreter utilized;Prefers language other than English (Spanish speaking, daughter interpreted)  Cognition Arousal/Alertness: Awake/alert Behavior During Therapy: WFL for tasks assessed/performed Overall Cognitive Status: Within Functional Limits for tasks assessed  General Comments General comments (skin integrity, edema, etc.): Daughter in the room and translated for session.  Reports she was doing a lot for her mom, then realised she needed to be doing things so switched to see if she can start doing things for herself.    Exercises     Assessment/Plan    PT Assessment Patient needs  continued PT services  PT Problem List Decreased strength;Decreased mobility;Decreased coordination;Decreased safety awareness;Decreased activity tolerance;Decreased balance;Decreased knowledge of use of DME;Impaired sensation       PT Treatment Interventions DME instruction;Therapeutic activities;Gait training;Therapeutic exercise;Patient/family education;Balance training;Functional mobility training    PT Goals (Current goals can be found in the Care Plan section)  Acute Rehab PT Goals Patient Stated Goal: to go to daughter's house PT Goal Formulation: With patient/family Time For Goal Achievement: 02/23/21 Potential to Achieve Goals: Good    Frequency Min 3X/week   Barriers to discharge        Co-evaluation               AM-PAC PT "6 Clicks" Mobility  Outcome Measure Help needed turning from your back to your side while in a flat bed without using bedrails?: A Little Help needed moving from lying on your back to sitting on the side of a flat bed without using bedrails?: A Little Help needed moving to and from a bed to a chair (including a wheelchair)?: A Little Help needed standing up from a chair using your arms (e.g., wheelchair or bedside chair)?: A Little Help needed to walk in hospital room?: A Little Help needed climbing 3-5 steps with a railing? : A Little 6 Click Score: 18    End of Session   Activity Tolerance: Patient limited by fatigue Patient left: in chair;with call bell/phone within reach;with family/visitor present   PT Visit Diagnosis: Other abnormalities of gait and mobility (R26.89);Other symptoms and signs involving the nervous system (R29.898);Dizziness and giddiness (R42)    Time: 3419-3790 PT Time Calculation (min) (ACUTE ONLY): 33 min   Charges:   PT Evaluation $PT Eval Moderate Complexity: 1 Mod PT Treatments $Gait Training: 8-22 mins        Magda Kiel, PT Acute Rehabilitation  Services [facility]:[phone] [phone] 02/09/2021   Teresa [name] 02/09/2021, 3:48 PM

## 2021-02-10 ENCOUNTER — Inpatient Hospital Stay (HOSPITAL_COMMUNITY)
Admission: RE | Admit: 2021-02-10 | Discharge: 2021-02-17 | DRG: 949 | Disposition: A | Discharge status: Home / Self Care | Payer: Self-pay | Source: Intra-hospital | Attending: Physical Medicine & Rehabilitation | Admitting: Physical Medicine & Rehabilitation

## 2021-02-10 ENCOUNTER — Other Ambulatory Visit: Payer: Self-pay

## 2021-02-10 ENCOUNTER — Encounter (HOSPITAL_COMMUNITY): Payer: Self-pay | Admitting: Physical Medicine & Rehabilitation

## 2021-02-10 DIAGNOSIS — C7931 Secondary malignant neoplasm of brain: Secondary | ICD-10-CM | POA: Diagnosis present

## 2021-02-10 DIAGNOSIS — D496 Neoplasm of unspecified behavior of brain: Secondary | ICD-10-CM

## 2021-02-10 DIAGNOSIS — R260 Ataxic gait: Secondary | ICD-10-CM | POA: Diagnosis present

## 2021-02-10 DIAGNOSIS — Z79899 Other long term (current) drug therapy: Secondary | ICD-10-CM

## 2021-02-10 DIAGNOSIS — T380X5A Adverse effect of glucocorticoids and synthetic analogues, initial encounter: Secondary | ICD-10-CM

## 2021-02-10 DIAGNOSIS — R739 Hyperglycemia, unspecified: Secondary | ICD-10-CM | POA: Diagnosis not present

## 2021-02-10 DIAGNOSIS — E663 Overweight: Secondary | ICD-10-CM | POA: Diagnosis present

## 2021-02-10 DIAGNOSIS — R001 Bradycardia, unspecified: Secondary | ICD-10-CM | POA: Diagnosis present

## 2021-02-10 DIAGNOSIS — C50911 Malignant neoplasm of unspecified site of right female breast: Secondary | ICD-10-CM

## 2021-02-10 DIAGNOSIS — Z853 Personal history of malignant neoplasm of breast: Secondary | ICD-10-CM

## 2021-02-10 DIAGNOSIS — Z87891 Personal history of nicotine dependence: Secondary | ICD-10-CM

## 2021-02-10 DIAGNOSIS — R7989 Other specified abnormal findings of blood chemistry: Secondary | ICD-10-CM | POA: Diagnosis present

## 2021-02-10 DIAGNOSIS — R42 Dizziness and giddiness: Secondary | ICD-10-CM

## 2021-02-10 DIAGNOSIS — Z85118 Personal history of other malignant neoplasm of bronchus and lung: Secondary | ICD-10-CM

## 2021-02-10 DIAGNOSIS — Y92239 Unspecified place in hospital as the place of occurrence of the external cause: Secondary | ICD-10-CM | POA: Diagnosis not present

## 2021-02-10 DIAGNOSIS — R748 Abnormal levels of other serum enzymes: Secondary | ICD-10-CM | POA: Diagnosis not present

## 2021-02-10 DIAGNOSIS — G8918 Other acute postprocedural pain: Secondary | ICD-10-CM

## 2021-02-10 DIAGNOSIS — K5901 Slow transit constipation: Secondary | ICD-10-CM

## 2021-02-10 DIAGNOSIS — Z716 Tobacco abuse counseling: Secondary | ICD-10-CM

## 2021-02-10 DIAGNOSIS — R413 Other amnesia: Secondary | ICD-10-CM | POA: Diagnosis present

## 2021-02-10 DIAGNOSIS — R7401 Elevation of levels of liver transaminase levels: Secondary | ICD-10-CM

## 2021-02-10 DIAGNOSIS — Z6828 Body mass index (BMI) 28.0-28.9, adult: Secondary | ICD-10-CM

## 2021-02-10 DIAGNOSIS — Z483 Aftercare following surgery for neoplasm: Principal | ICD-10-CM

## 2021-02-10 DIAGNOSIS — R2681 Unsteadiness on feet: Secondary | ICD-10-CM | POA: Diagnosis present

## 2021-02-10 MED ORDER — ONDANSETRON HCL 4 MG/2ML IJ SOLN: 4.0000 mg | INTRAMUSCULAR | Status: DC | PRN

## 2021-02-10 MED ORDER — DEXAMETHASONE 4 MG PO TABS
4.0000 mg | ORAL_TABLET | Freq: Three times a day (TID) | ORAL | Status: DC
  Administered 2021-02-10 – 2021-02-15 (×14): 4 mg via ORAL
  Filled 2021-02-10 (×14): qty 1

## 2021-02-10 MED ORDER — SODIUM CHLORIDE 0.9% FLUSH
10.0000 mL | INTRAVENOUS | Status: DC | PRN
  Administered 2021-02-11 – 2021-02-13 (×3): 10 mL

## 2021-02-10 MED ORDER — ONDANSETRON HCL 4 MG PO TABS
4.0000 mg | ORAL_TABLET | ORAL | Status: DC | PRN
  Filled 2021-02-10: qty 1

## 2021-02-10 MED ORDER — ACETAMINOPHEN 650 MG RE SUPP: 650.0000 mg | RECTAL | Status: DC | PRN

## 2021-02-10 MED ORDER — HYDROCODONE-ACETAMINOPHEN 5-325 MG PO TABS: 1.0000 | ORAL_TABLET | ORAL | Status: DC | PRN

## 2021-02-10 MED ORDER — PANTOPRAZOLE SODIUM 40 MG PO TBEC
40.0000 mg | DELAYED_RELEASE_TABLET | Freq: Every day | ORAL | Status: DC
  Administered 2021-02-10 – 2021-02-16 (×7): 40 mg via ORAL
  Filled 2021-02-10 (×7): qty 1

## 2021-02-10 MED ORDER — DOCUSATE SODIUM 100 MG PO CAPS
100.0000 mg | ORAL_CAPSULE | Freq: Two times a day (BID) | ORAL | Status: DC
  Administered 2021-02-10 – 2021-02-17 (×13): 100 mg via ORAL
  Filled 2021-02-10 (×14): qty 1

## 2021-02-10 MED ORDER — ACETAMINOPHEN 325 MG PO TABS
650.0000 mg | ORAL_TABLET | ORAL | Status: DC | PRN
  Administered 2021-02-11 – 2021-02-16 (×5): 650 mg via ORAL
  Filled 2021-02-10 (×5): qty 2

## 2021-02-10 MED ORDER — POLYETHYLENE GLYCOL 3350 17 G PO PACK
17.0000 g | PACK | Freq: Two times a day (BID) | ORAL | Status: DC | PRN
  Administered 2021-02-10: 17 g via ORAL
  Filled 2021-02-10: qty 1

## 2021-02-10 MED ORDER — SODIUM CHLORIDE 0.9% FLUSH
10.0000 mL | Freq: Two times a day (BID) | INTRAVENOUS | Status: DC
  Administered 2021-02-10 – 2021-02-14 (×5): 10 mL

## 2021-02-10 MED ORDER — CHLORHEXIDINE GLUCONATE CLOTH 2 % EX PADS
6.0000 | MEDICATED_PAD | Freq: Two times a day (BID) | CUTANEOUS | Status: DC
  Administered 2021-02-11 – 2021-02-17 (×13): 6 via TOPICAL

## 2021-02-10 NOTE — Progress Notes (Signed)
INPATIENT REHABILITATION ADMISSION NOTE   Arrival Method: via bed      Mental Orientation: AXO 2-3   Assessment: done    Skin: ecchymosis and  Varicose veins    IV'S: implanted port in right chest    Pain: Headache 4/10 with morphine  Given prior to arrival    Tubes and Drains: no tubes or drains    Safety Measures: family allowed to stay at bedside  For translation and reducing nursing burden    Vital Signs: Done and stable    Height and Weight: 5 foot 1    Rehab Orientation: oriented to valuables policy and rehab plan of care    Family: daughter Verdis Frederickson at bedside is emergency contact     Notes: Patient arrived on unit at 23 was checked in per policy and advised via daughter translation of policies and procedures of unit and schedule of the next couple days  Patient oriented to call bell and need to call for assistance.

## 2021-02-10 NOTE — Evaluation (Signed)
Occupational Therapy Evaluation Patient Details Name: Teresa [name] MRN: [id number] DOB: 1951-02-26 Today's Date: 02/10/2021    History of Present Illness The patient is a 70 year old Hispanic female with a history of breast cancer, arthritis and tobacco abuse.  She is developed headaches unsteady gait, etc.  She was worked up with a head CT and a brain MRI which demonstrated a left cerebellar lesion consistent with metastasis.  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   Clinical Impression   Pt admitted with above. She demonstrates the below listed deficits and will benefit from continued OT to maximize safety and independence with BADLs.  Pt presents to OT with generalized weakness, decreased activity tolerance, impaired balance, mild nystagmus.  She currently requires set up assist - mod A for ADLs and functional mobility.  She lives with family and was fully independent PTA.  Recommend CIR.   Will follow.       Follow Up Recommendations  CIR;Supervision/Assistance - 24 hour    Equipment Recommendations  None recommended by OT    Recommendations for Other Services Rehab consult     Precautions / Restrictions Precautions Precautions: Fall      Mobility Bed Mobility Overal bed mobility: Needs Assistance Bed Mobility: Supine to Sit;Sit to Supine     Supine to sit: Supervision;HOB elevated Sit to supine: Supervision;HOB elevated   General bed mobility comments: reliance on bedrails    Transfers Overall transfer level: Needs assistance Equipment used: None Transfers: Sit to/from [facility] Sit to Stand: Mod assist Stand pivot transfers: Min assist       General transfer comment: mod A to power up from lower surface such as toilet and assist for balance    Balance Overall balance assessment: Needs assistance   Sitting balance-Leahy Scale: Good     Standing balance  support: No upper extremity supported Standing balance-Leahy Scale: Fair Standing balance comment: able to maintain static standing with min guard assist                           ADL either performed or assessed with clinical judgement   ADL Overall ADL's : Needs assistance/impaired Eating/Feeding: Independent;Bed level;Sitting   Grooming: Wash/dry hands;Wash/dry face;Oral care;Brushing hair;Minimal assistance;Standing Grooming Details (indicate cue type and reason): assist to steady Upper Body Bathing: Set up;Supervision/ safety;Sitting   Lower Body Bathing: Moderate assistance;Sit to/from stand Lower Body Bathing Details (indicate cue type and reason): mod A for sit to stand.  Difficulty accessing Rt foot Upper Body Dressing : Supervision/safety;Set up;Sitting   Lower Body Dressing: Moderate assistance;Sit to/from stand Lower Body Dressing Details (indicate cue type and reason): difficulty accessing Rt foot and mod A for balance Toilet Transfer: Moderate assistance;Ambulation;Comfort height toilet;Grab bars Toilet Transfer Details (indicate cue type and reason): assist for sit to stand Toileting- Clothing Manipulation and Hygiene: Minimal assistance;Sit to/from stand       Functional mobility during ADLs: Moderate assistance General ADL Comments: Pt prefers hand held assist instead of RW     Vision Baseline Vision/History: Wears glasses Wears Glasses: Reading only Patient Visual Report: No change from baseline Vision Assessment?: Yes Eye Alignment: Within Functional Limits Additional Comments: nystgmus noted with Lt gaze     Perception Perception Perception Tested?: Yes   Praxis Praxis Praxis tested?: Within functional limits    Pertinent Vitals/Pain Pain Assessment: 0-10 Pain Score: 3  Pain Location: head Pain Descriptors /  Indicators: Aching;Discomfort;Pressure Pain Intervention(s): Monitored during session;Repositioned     Hand Dominance Right    Extremity/Trunk Assessment Upper Extremity Assessment Upper Extremity Assessment: Overall WFL for tasks assessed   Lower Extremity Assessment Lower Extremity Assessment: Defer to PT evaluation   Cervical / Trunk Assessment Cervical / Trunk Assessment: Normal   Communication Communication Communication: Interpreter utilized;Prefers language other than English;Other (comment) (refused interpreter instead requesting that daughter interpret)   Cognition Arousal/Alertness: Awake/alert Behavior During Therapy: WFL for tasks assessed/performed Overall Cognitive Status: Within Functional Limits for tasks assessed                                     General Comments  Pt endorses sitting (heavy headedness with movement).  Pt moves very cautiously and is guarded.    Exercises     Shoulder Instructions      Home Living Family/patient expects to be discharged to:: Private residence Living Arrangements: Children Available Help at Discharge: Family Type of Home: House Home Access: Stairs to enter Technical brewer of Steps: 15 Entrance Stairs-Rails: Right Home Layout: Two level;Bed/bath upstairs     Bathroom Shower/Tub: Teacher, early years/pre: Standard     Home Equipment: None          Prior Functioning/Environment Level of Independence: Independent                 OT Problem List: Decreased activity tolerance;Impaired balance (sitting and/or standing);Impaired vision/perception;Decreased knowledge of use of DME or AE;Pain      OT Treatment/Interventions: Self-care/ADL training;DME and/or AE instruction;Therapeutic activities;Visual/perceptual remediation/compensation;Patient/family education;Balance training    OT Goals(Current goals can be found in the care plan section) Acute Rehab OT Goals Patient Stated Goal: To feel better OT Goal Formulation: With patient/family Time For Goal Achievement: 02/24/21 Potential to Achieve Goals:  Good ADL Goals Pt Will Perform Grooming: with supervision;standing Pt Will Perform Lower Body Bathing: with supervision;sit to/from stand Pt Will Perform Lower Body Dressing: with supervision;sit to/from stand Pt Will Transfer to Toilet: with supervision;ambulating;regular height toilet;grab bars Pt Will Perform Toileting - Clothing Manipulation and hygiene: with supervision;sit to/from stand Pt Will Perform Tub/Shower Transfer: Tub transfer;with min guard assist;ambulating;shower seat  OT Frequency: Min 2X/week   Barriers to D/C:            Co-evaluation              AM-PAC OT "6 Clicks" Daily Activity     Outcome Measure Help from another person eating meals?: None Help from another person taking care of personal grooming?: A Little Help from another person toileting, which includes using toliet, bedpan, or urinal?: A Lot Help from another person bathing (including washing, rinsing, drying)?: A Lot Help from another person to put on and taking off regular upper body clothing?: A Little Help from another person to put on and taking off regular lower body clothing?: A Lot 6 Click Score: 16   End of Session Nurse Communication: Mobility status  Activity Tolerance: Patient tolerated treatment well Patient left: in bed;with call bell/phone within reach;with bed alarm set  OT Visit Diagnosis: Unsteadiness on feet (R26.81);Dizziness and giddiness (R42)                Time: 1191-4782 OT Time Calculation (min): 40 min Charges:  OT General Charges $OT Visit: 1 Visit OT Evaluation $OT Eval Moderate Complexity: 1 Mod OT Treatments $Self Care/Home Management : 23-37  mins  Nilsa Nutting., OTR/L Acute Rehabilitation Services Pager [phone] Office [place], [place] 02/10/2021, 10:28 AM

## 2021-02-10 NOTE — Progress Notes (Signed)
PMR Admission Coordinator Pre-Admission Assessment  Patient: Teresa [name] is an 69 y.o., female MRN: [id number] DOB: 06/26/1951 Height: 5' 1" (154.9 cm) Weight: 68.9 kg  Insurance Information HMO:     PPO:      PCP:      IPA:      80/20:      OTHER:  PRIMARY: uninsured      Policy#:       Subscriber:  CM Name:       Phone#:      Fax#:  Pre-Cert#:       Employer:  Benefits:  Phone #:      Name:  Eff. Date:      Deduct:       Out of Pocket Max:       Life Max:  CIR:       SNF:  Outpatient:      Co-Pay:  Home Health:       Co-Pay:  DME:      Co-Pay:  Providers:  SECONDARY:       Policy#:      Phone#:   Financial Counselor:       Phone#:   The "Data Collection Information Summary" for patients in Inpatient Rehabilitation Facilities with attached "Privacy Act Statement-Health Care Records" was provided and verbally reviewed with: N/A  Emergency Contact Information Contact Information    Name Relation Home Work Mobile   Duarte,Maria Daughter [phone]        Current Medical History  Patient Admitting Diagnosis: metastatic brain cancer  History of Present Illness: Teresa [name] is a 69-year-old right-handed Hispanic female with history of breast cancer, status post breast lumpectomy 2015, including previous spread of disease into her lungs, as well as history of tobacco use.  She last received her 24 cycle of TDM-1 late August 2021 followed by Dr. Dequincy Lewis.  Presented 02/06/2021 with persistent headache nausea and unsteady gait.  CT of the brain and MRI imaging demonstrated a left cerebellar tumor consistent with metastasis.  Patient underwent left suboccipital craniectomy for gross total resection of left cerebellar tumor using microdissection 02/06/2021 per Dr. Jeffrey Jenkins.  Maintained on Decadron protocol.  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  Awaiting followed by oncology services.  Tolerating a regular  diet.  Therapy evaluations completed due to patient's decreased functional mobility was recommended for a comprehensive rehab program    Patient's medical record from [facility] has been reviewed by the rehabilitation admission coordinator and physician.  Past Medical History  Past Medical History:  Diagnosis Date  . Arthritis   . Lung cancer (HCC) 2015    Family History   family history is not on file.  Prior Rehab/Hospitalizations Has the patient had prior rehab or hospitalizations prior to admission? No  Has the patient had major surgery during 100 days prior to admission? Yes   Current Medications  Current Facility-Administered Medications:  .  acetaminophen (TYLENOL) tablet 650 mg, 650 mg, Oral, Q4H PRN, 650 mg at 02/07/21 1144 **OR** acetaminophen (TYLENOL) suppository 650 mg, 650 mg, Rectal, Q4H PRN, Jenkins, Jeffrey, MD .  Chlorhexidine Gluconate Cloth 2 % PADS 6 each, 6 each, Topical, Daily, Jenkins, Jeffrey, MD, 6 each at 02/10/21 1026 .  [COMPLETED] dexamethasone (DECADRON) injection 6 mg, 6 mg, Intravenous, Q6H, 6 mg at 02/07/21 1144 **FOLLOWED BY** [COMPLETED] dexamethasone (DECADRON) injection 4 mg, 4 mg, Intravenous, Q6H, 4 mg at 02/08/21 1215 **FOLLOWED BY** dexamethasone (DECADRON) injection 4 mg,   4 mg, Intravenous, Q8H, Jenkins, Jeffrey, MD, 4 mg at 02/10/21 0650 .  docusate sodium (COLACE) capsule 100 mg, 100 mg, Oral, BID, Jenkins, Jeffrey, MD, 100 mg at 02/10/21 1026 .  HYDROcodone-acetaminophen (NORCO/VICODIN) 5-325 MG per tablet 1-2 tablet, 1-2 tablet, Oral, Q4H PRN, Jenkins, Jeffrey, MD, 2 tablet at 02/09/21 2222 .  labetalol (NORMODYNE) injection 10-40 mg, 10-40 mg, Intravenous, Q10 min PRN, Jenkins, Jeffrey, MD .  morphine 2 MG/ML injection 1-2 mg, 1-2 mg, Intravenous, Q2H PRN, Jenkins, Jeffrey, MD, 2 mg at 02/08/21 1745 .  ondansetron (ZOFRAN) tablet 4 mg, 4 mg, Oral, Q4H PRN **OR** ondansetron (ZOFRAN) injection 4 mg, 4 mg, Intravenous, Q4H PRN,  Jenkins, Jeffrey, MD .  pantoprazole (PROTONIX) EC tablet 40 mg, 40 mg, Oral, QHS, Jenkins, Jeffrey, MD, 40 mg at 02/09/21 2222 .  polyethylene glycol (MIRALAX / GLYCOLAX) packet 17 g, 17 g, Oral, BID PRN, Jenkins, Jeffrey, MD, 17 g at 02/09/21 0857 .  promethazine (PHENERGAN) tablet 12.5-25 mg, 12.5-25 mg, Oral, Q4H PRN, Jenkins, Jeffrey, MD  Patients Current Diet:  Diet Order            Diet regular Room service appropriate? Yes; Fluid consistency: Thin  Diet effective now                 Precautions / Restrictions Precautions Precautions: Fall Restrictions Weight Bearing Restrictions: No   Has the patient had 2 or more falls or a fall with injury in the past year? No  Prior Activity Level Community (5-7x/wk): was taking family members to the cancer center prior to decline, no DME used  Prior Functional Level Self Care: Did the patient need help bathing, dressing, using the toilet or eating? Independent  Indoor Mobility: Did the patient need assistance with walking from room to room (with or without device)? Independent  Stairs: Did the patient need assistance with internal or external stairs (with or without device)? Independent  Functional Cognition: Did the patient need help planning regular tasks such as shopping or remembering to take medications? Independent  Home Assistive Devices / Equipment Home Assistive Devices/Equipment: Eyeglasses Home Equipment: None  Prior Device Use: Indicate devices/aids used by the patient prior to current illness, exacerbation or injury? None of the above  Current Functional Level Cognition  Overall Cognitive Status: Within Functional Limits for tasks assessed Orientation Level: Oriented X4    Extremity Assessment (includes Sensation/Coordination)  Upper Extremity Assessment: Overall WFL for tasks assessed  Lower Extremity Assessment: Defer to PT evaluation    ADLs  Overall ADL's : Needs assistance/impaired Eating/Feeding:  Independent,Bed level,Sitting Grooming: Wash/dry hands,Wash/dry face,Oral care,Brushing hair,Minimal assistance,Standing Grooming Details (indicate cue type and reason): assist to steady Upper Body Bathing: Set up,Supervision/ safety,Sitting Lower Body Bathing: Moderate assistance,Sit to/from stand Lower Body Bathing Details (indicate cue type and reason): mod A for sit to stand.  Difficulty accessing Rt foot Upper Body Dressing : Supervision/safety,Set up,Sitting Lower Body Dressing: Moderate assistance,Sit to/from stand Lower Body Dressing Details (indicate cue type and reason): difficulty accessing Rt foot and mod A for balance Toilet Transfer: Moderate assistance,Ambulation,Comfort height toilet,Grab bars Toilet Transfer Details (indicate cue type and reason): assist for sit to stand Toileting- Clothing Manipulation and Hygiene: Minimal assistance,Sit to/from stand Functional mobility during ADLs: Moderate assistance General ADL Comments: Pt prefers hand held assist instead of RW    Mobility  Overal bed mobility: Needs Assistance Bed Mobility: Supine to Sit,Sit to Supine Supine to sit: Supervision,HOB elevated Sit to supine: Supervision,HOB elevated General bed mobility comments: reliance   on bedrails    Transfers  Overall transfer level: Needs assistance Equipment used: None Transfers: Sit to/from Stand,Stand Pivot Transfers Sit to Stand: Mod assist Stand pivot transfers: Min assist General transfer comment: mod A to power up from lower surface such as toilet and assist for balance    Ambulation / Gait / Stairs / Wheelchair Mobility  Ambulation/Gait Ambulation/Gait assistance: Min assist Gait Distance (Feet): 45 Feet Assistive device: Rolling walker (2 wheeled),1 person hand held assist Gait Pattern/deviations: Step-through pattern,Decreased stride length,Shuffle,Wide base of support General Gait Details: initially with HHA, then pt very slow and reaching for another hand so  stopped and got walker, then with cues for proximity to walker and need for slow turns and visual target due to c/o dizziness    Posture / Balance Balance Overall balance assessment: Needs assistance Sitting balance-Leahy Scale: Good Standing balance support: No upper extremity supported Standing balance-Leahy Scale: Fair Standing balance comment: able to maintain static standing with min guard assist    Special needs/care consideration Special service needs spanish interpreter   Previous Home Environment (from acute therapy documentation) Living Arrangements: Children Available Help at Discharge: Family Type of Home: House Home Layout: Two level,Bed/bath upstairs Home Access: Stairs to enter Entrance Stairs-Rails: Right Entrance Stairs-Number of Steps: 15 Bathroom Shower/Tub: Tub/shower unit Bathroom Toilet: Standard Home Care Services: No  Discharge Living Setting Plans for Discharge Living Setting: Lives with (comment) (daughter) Type of Home at Discharge: House Discharge Home Layout: Two level,Bed/bath upstairs Alternate Level Stairs-Rails: Right Alternate Level Stairs-Number of Steps: full flight Discharge Home Access: Level entry Discharge Bathroom Shower/Tub: Tub/shower unit Discharge Bathroom Toilet: Standard Discharge Bathroom Accessibility: Yes How Accessible: Accessible via walker Does the patient have any problems obtaining your medications?: Yes (Describe) (uninsured)  Social/Family/Support Systems Anticipated Caregiver: daughter, Maria Duarte Anticipated Caregiver's Contact Information: [phone] Ability/Limitations of Caregiver: n/a Caregiver Availability: 24/7 Discharge Plan Discussed with Primary Caregiver: Yes Is Caregiver In Agreement with Plan?: Yes Does Caregiver/Family have Issues with Lodging/Transportation while Pt is in Rehab?: No  Goals Patient/Family Goal for Rehab: PT/OT/SLP supervision Expected length of stay: 14-16 days Cultural  Considerations: needs spanish interpreter Pt/Family Agrees to Admission and willing to participate: Yes Program Orientation Provided & Reviewed with Pt/Caregiver Including Roles  & Responsibilities: Yes  Decrease burden of Care through IP rehab admission: n/a Possible need for SNF placement upon discharge: Not anticipated.   Patient Condition: I have reviewed medical records from [facility], spoken with CM, and patient and daughter. I met with patient at the bedside for inpatient rehabilitation assessment.  Patient will benefit from ongoing PT, OT and SLP, can actively participate in 3 hours of therapy a day 5 days of the week, and can make measurable gains during the admission.  Patient will also benefit from the coordinated team approach during an Inpatient Acute Rehabilitation admission.  The patient will receive intensive therapy as well as Rehabilitation physician, nursing, social worker, and care management interventions.  Due to safety, disease management, medication administration, pain management and patient education the patient requires 24 hour a day rehabilitation nursing.  The patient is currently min to mod assist with mobility and basic ADLs.  Discharge setting and therapy post discharge at home is anticipated.  Patient has agreed to participate in the Acute Inpatient Rehabilitation Program and will admit today.  Preadmission Screen Completed By:  Dariah Mcsorley E Lalana Wachter, PT, DPT 02/10/2021 11:42 AM ______________________________________________________________________   Discussed status with Dr. Raulkar  on 02/10/21  at 11:45 AM    and received approval for admission today.  Admission Coordinator:  Marshayla Mitschke E Arvie Villarruel, PT, DPT, time 11:45 AM /Date 02/10/21    Assessment/Plan: Diagnosis: Brain metastasis from primary breast cancer 1. Does the need for close, 24 hr/day Medical supervision in concert with the patient's rehab needs make it unreasonable for this patient to be served in a less  intensive setting? Yes 2. Co-Morbidities requiring supervision/potential complications:  1. Overweight BMI 28.72 2. Hyperglycemia 3. Anemia 4. Bradycardia 5. S/p left suboccipital craniectomy 3. Due to bladder management, bowel management, safety, skin/wound care, disease management, medication administration, pain management and patient education, does the patient require 24 hr/day rehab nursing? Yes 4. Does the patient require coordinated care of a physician, rehab nurse, PT, OT to address physical and functional deficits in the context of the above medical diagnosis(es)? Yes Addressing deficits in the following areas: balance, endurance, locomotion, strength, transferring, bowel/bladder control, bathing, dressing, feeding, grooming, toileting and psychosocial support 5. Can the patient actively participate in an intensive therapy program of at least 3 hrs of therapy 5 days a week? Yes 6. The potential for patient to make measurable gains while on inpatient rehab is excellent 7. Anticipated functional outcomes upon discharge from inpatient rehab: modified independent PT, modified independent OT, independent SLP 8. Estimated rehab length of stay to reach the above functional goals is: 7-10 days 9. Anticipated discharge destination: Home 10. Overall Rehab/Functional Prognosis: excellent   MD Signature: Krutika Raulkar, MD 

## 2021-02-10 NOTE — Plan of Care (Signed)

## 2021-02-10 NOTE — Consult Note (Incomplete)
Physical Medicine and Rehabilitation Consult Reason for Consult: Headache with gait abnormality Referring Physician: Dr. Arnoldo Morale   HPI: Teresa [name] is a 70 y.o. right-handed Hispanic female with history of breast cancer status post breast lumpectomy 2015 including previous spread of disease to her lungs, she last received her 24 cycle of TDM-1 late August 2021 followed by Dr. Lavera Guise.  Tobacco abuse.  Per chart review patient lives with her daughter.  Reportedly independent prior to admission.  Two-level home bed and bath upstairs.  Presented 02/06/2021 with headache nausea and unsteady gait.  CT and brain MRI imaging demonstrated a left cerebellar tumor consistent with metastasis.  Patient underwent left suboccipital craniectomy for gross total resection of left cerebellar tumor using microdissection 02/06/2021 per Dr. Newman Pies.  Maintained on Decadron protocol.  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  Awaiting plan follow-up oncology services.  Tolerating a regular diet.  Therapy evaluations completed due to decreased functional mobility recommendations of physical medicine rehab consult.   Review of Systems  Constitutional: Negative for chills and fever.  HENT: Negative for hearing loss.   Eyes: Negative for blurred vision and double vision.  Respiratory: Negative for cough and shortness of breath.   Cardiovascular: Negative for chest pain and palpitations.  Gastrointestinal: Positive for constipation. Negative for heartburn, nausea and vomiting.  Genitourinary: Negative for dysuria, flank pain and hematuria.  Musculoskeletal: Positive for myalgias.  Skin: Negative for rash.  Neurological: Positive for dizziness, weakness and headaches.  All other systems reviewed and are negative.  Past Medical History:  Diagnosis Date  . Arthritis   . Lung cancer ([place]) 2015   Past Surgical History:  Procedure Laterality  Date  . APPLICATION OF CRANIAL NAVIGATION N/A 02/06/2021   Procedure: APPLICATION OF CRANIAL NAVIGATION;  Surgeon: Newman Pies, MD;  Location: [place];  Service: Neurosurgery;  Laterality: N/A;  . BREAST LUMPECTOMY Left 2015  . BREAST SURGERY Left    tumor removal  . CRANIOTOMY N/A 02/06/2021   Procedure: Suboccipital Craniectomy for Tumor Resection with Brainlab Navigation;  Surgeon: Newman Pies, MD;  Location: [place];  Service: Neurosurgery;  Laterality: N/A;  . TUBAL LIGATION     History reviewed. No pertinent family history. Social History:  reports that she has quit smoking. Her smoking use included cigarettes. She has never used smokeless tobacco. She reports previous alcohol use. She reports that she does not use drugs. Allergies: No Known Allergies Medications Prior to Admission  Medication Sig Dispense Refill  . B Complex-C (B-COMPLEX WITH VITAMIN C) tablet Take 1 tablet by mouth daily after lunch.    . Cholecalciferol (VITAMIN D3 PO) Take 1 tablet by mouth daily after lunch.    . magnesium oxide (MAG-OX) 400 MG tablet Take 400 mg by mouth daily after lunch.    . Misc Natural Products (TURMERIC CURCUMIN) CAPS Take 2 capsules by mouth daily after lunch.    . naproxen sodium (ALEVE) 220 MG tablet Take 440 mg by mouth 2 (two) times daily as needed (pain).    . Omega-3 Fatty Acids (OMEGA 3 PO) Take 2 capsules by mouth daily after lunch.    . Potassium 99 MG TABS Take 99 mg by mouth daily after lunch.      Home: Home Living Family/patient expects to be discharged to:: Private residence Living Arrangements: Children Available Help at Discharge: Family Type of Home: House Home Access: Stairs to enter [employer] of Steps: 15 Entrance Stairs-Rails:  Right Home Layout: Two level,Bed/bath upstairs Bathroom Shower/Tub: Chiropodist: Standard Home Equipment: None  Functional History: Prior Function Level of Independence: Independent Functional  Status:  Mobility: Bed Mobility Overal bed mobility: Needs Assistance Bed Mobility: Supine to Sit Supine to sit: Min assist,HOB elevated General bed mobility comments: assist for lifting trunk Transfers Overall transfer level: Needs assistance Equipment used: None Transfers: Sit to/from Stand Sit to Stand: Min assist General transfer comment: assist for balance, Ambulation/Gait Ambulation/Gait assistance: Min assist Gait Distance (Feet): 45 Feet Assistive device: Rolling walker (2 wheeled),1 person hand held assist Gait Pattern/deviations: Step-through pattern,Decreased stride length,Shuffle,Wide base of support General Gait Details: initially with HHA, then pt very slow and reaching for another hand so stopped and got walker, then with cues for proximity to walker and need for slow turns and visual target due to c/o dizziness    ADL:    Cognition: Cognition Overall Cognitive Status: Within Functional Limits for tasks assessed Orientation Level: Oriented X4 Cognition Arousal/Alertness: Awake/alert Behavior During Therapy: WFL for tasks assessed/performed Overall Cognitive Status: Within Functional Limits for tasks assessed  Blood pressure 109/69, pulse (!) 53, temperature 97.9 F (36.6 C), temperature source Oral, resp. rate 18, height 5\' 1"  (1.549 m), weight 68.9 kg, SpO2 95 %. Physical Exam HENT:     Head:     Comments: Craniectomy site clean and dry. Neurological:     Comments: Patient is alert in no acute distress.  Makes eye contact with examiner and follows commands.  Non-English-speaking.  Her daughter was at bedside.     No results found for this or any previous visit (from the past 24 hour(s)). No results found.  ***  Lavon Paganini Angiulli, PA-C 02/10/2021

## 2021-02-10 NOTE — H&P (Signed)
Physical Medicine and Rehabilitation Admission H&P     HPI: Teresa [name] is a 70 year old right-handed Hispanic female with history of breast cancer status post breast lumpectomy 2015 including previous spread of disease into her lungs as well as history of tobacco use.  She last received her 24 cycle of TDM-1 late August 2021 followed by Dr. Lavera Guise.  Per chart review she lives with her daughter.  Reportedly independent prior to admission.  Two-level home bed and bath upstairs.  Presented 02/06/2021 with persistent headache nausea and unsteady gait.  CT of the brain and MRI imaging demonstrated a left cerebellar tumor consistent with metastasis.  Patient underwent left suboccipital craniectomy for gross total resection of left cerebellar tumor using microdissection 02/06/2021 per Dr. Newman Pies.  Maintained on Decadron protocol.  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  Awaiting followed by oncology services.  Tolerating a regular diet.  Therapy evaluations completed due to patient's decreased functional mobility was admitted for a comprehensive rehab program. She is currently alert and oriented x3.   Review of Systems  Constitutional: Negative for chills and fever.  HENT: Negative for hearing loss.   Eyes: Negative for blurred vision and double vision.  Respiratory: Negative for cough and shortness of breath.   Cardiovascular: Negative for chest pain, palpitations and leg swelling.  Gastrointestinal: Positive for constipation. Negative for heartburn, nausea and vomiting.  Genitourinary: Negative for dysuria, flank pain and hematuria.  Musculoskeletal: Positive for myalgias.  Skin: Negative for rash.  Neurological: Positive for dizziness, weakness and headaches.  All other systems reviewed and are negative.  Past Medical History:  Diagnosis Date  . Arthritis   . Lung cancer ([place]) 2015   Past Surgical History:   Procedure Laterality Date  . APPLICATION OF CRANIAL NAVIGATION N/A 02/06/2021   Procedure: APPLICATION OF CRANIAL NAVIGATION;  Surgeon: Newman Pies, MD;  Location: [place];  Service: Neurosurgery;  Laterality: N/A;  . BREAST LUMPECTOMY Left 2015  . BREAST SURGERY Left    tumor removal  . CRANIOTOMY N/A 02/06/2021   Procedure: Suboccipital Craniectomy for Tumor Resection with Brainlab Navigation;  Surgeon: Newman Pies, MD;  Location: [place];  Service: Neurosurgery;  Laterality: N/A;  . TUBAL LIGATION     History reviewed. No pertinent family history. Social History:  reports that she has quit smoking. Her smoking use included cigarettes. She has never used smokeless tobacco. She reports previous alcohol use. She reports that she does not use drugs. Allergies: No Known Allergies Medications Prior to Admission  Medication Sig Dispense Refill  . B Complex-C (B-COMPLEX WITH VITAMIN C) tablet Take 1 tablet by mouth daily after lunch.    . Cholecalciferol (VITAMIN D3 PO) Take 1 tablet by mouth daily after lunch.    . magnesium oxide (MAG-OX) 400 MG tablet Take 400 mg by mouth daily after lunch.    . Misc Natural Products (TURMERIC CURCUMIN) CAPS Take 2 capsules by mouth daily after lunch.    . naproxen sodium (ALEVE) 220 MG tablet Take 440 mg by mouth 2 (two) times daily as needed (pain).    . Omega-3 Fatty Acids (OMEGA 3 PO) Take 2 capsules by mouth daily after lunch.    . Potassium 99 MG TABS Take 99 mg by mouth daily after lunch.      Drug Regimen Review Drug regimen was reviewed and remains appropriate with no significant issues identified  Home: Home Living Family/patient expects to be discharged to:: Private residence  Living Arrangements: Children Available Help at Discharge: Family Type of Home: House Home Access: Stairs to enter Technical brewer of Steps: 15 Entrance Stairs-Rails: Right Home Layout: Two level,Bed/bath upstairs Bathroom Shower/Tub: Scientist, forensic: Standard Home Equipment: None   Functional History: Prior Function Level of Independence: Independent  Functional Status:  Mobility: Bed Mobility Overal bed mobility: Needs Assistance Bed Mobility: Supine to Sit Supine to sit: Min assist,HOB elevated General bed mobility comments: assist for lifting trunk Transfers Overall transfer level: Needs assistance Equipment used: None Transfers: Sit to/from Stand Sit to Stand: Min assist General transfer comment: assist for balance, Ambulation/Gait Ambulation/Gait assistance: Min Web designer (Feet): 45 Feet Assistive device: Rolling walker (2 wheeled),1 person hand held assist Gait Pattern/deviations: Step-through pattern,Decreased stride length,Shuffle,Wide base of support General Gait Details: initially with HHA, then pt very slow and reaching for another hand so stopped and got walker, then with cues for proximity to walker and need for slow turns and visual target due to c/o dizziness    ADL:    Cognition: Cognition Overall Cognitive Status: Within Functional Limits for tasks assessed Orientation Level: Oriented X4 Cognition Arousal/Alertness: Awake/alert Behavior During Therapy: WFL for tasks assessed/performed Overall Cognitive Status: Within Functional Limits for tasks assessed  Physical Exam: Blood pressure 104/69, pulse (!) 52, temperature (!) 97.4 F (36.3 C), temperature source Oral, resp. rate 18, height 5\' 1"  (1.549 m), weight 68.9 kg, SpO2 95 %. Physical Exam Gen: no distress, normal appearing HEENT: oral mucosa pink and moist, NCAT Cardio: Bradycardic Chest: normal effort, normal rate of breathing Abd: soft, non-distended Ext: no edema Psych: pleasant, normal affect Skin: intact Neurological:     Comments: Patient is alert and oriented x3.  Non-English-speaking female.  No acute distress.  Follows simple commands.  Daughter was at bedside.  Patient answers questions  appropriately through her daughter translating. 5/5 strength throughout    No results found for this or any previous visit (from the past 48 hour(s)). No results found.     Medical Problem List and Plan: 1.  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  Status post left suboccipital craniectomy gross total resection on 02/06/2021.  Decadron protocol taper  -patient may shower but incision must be covered  -ELOS/Goals: modI 7-10 days  -Admit to CIR 2.  Impaired mobility -DVT/anticoagulation: Continue SCDs  -antiplatelet therapy: N/A 3. Postoperative pain: Hydrocodone as needed. D/c IV morphine.  4. Mood: Provide emotional support  -antipsychotic agents: N/A 5. Neuropsych: This patient is capable of making decisions on her own behalf. 6. Skin/Wound Care: Routine skin checks 7. Fluids/Electrolytes/Nutrition: Routine in and outs with follow-up chemistries 8.  Constipation.  Colace 100 mg twice daily, MiraLAX as needed. 9.  Tobacco use.  Counseling 10. Overweight BMI 28.72: provide counseling  I have personally performed a face to face diagnostic evaluation, including, but not limited to relevant history and physical exam findings, of this patient and developed relevant assessment and plan.  Additionally, I have reviewed and concur with the physician assistant's documentation above.  Leeroy Cha, MD  Lavon Paganini [place], PA-C 02/10/2021

## 2021-02-10 NOTE — Progress Notes (Signed)
Inpatient Rehab Admissions:  Inpatient Rehab Consult received.  I met with patient and her daughter at the bedside for rehabilitation assessment and to discuss goals and expectations of an inpatient rehab admission.  Pt is somewhat sleepy, but per daughter had just gotten up and eaten.  I explained goals of supervision level with estimated length of stay about 2 weeks.  Plan for pt to discharge to her daughter's home after rehab and her daughter will be taking a leave from work to provide care.  Pt also has other family in the area that can assist. Dr Ellene Route in agreement with admit to CIR today. Will let pt/family and CM know.   Signed: Shann Medal, PT, DPT Admissions Coordinator [phone] 02/10/21  11:37 AM

## 2021-02-10 NOTE — Progress Notes (Signed)
Physical Therapy Treatment Patient Details Name: Teresa [name] MRN: [id number] DOB: 05/31/1951 Today's Date: 02/10/2021    History of Present Illness The patient is a 70 year old Hispanic female who was admitted to hospital on 02/06/21 with headaches unsteady gait, etc.  She was worked up with a head CT and a brain MRI which demonstrated a left cerebellar lesion consistent with metastasis.  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 Pt with a history of breast cancer, arthritis and tobacco abuse.    PT Comments    Pt making gradual progress.  Continues to move at very slow/cautious speeds and fatigues easily.  Gait speed of .11 ft/sec indicating fall risk.  Slow speed seems to be related to dizziness, fatigue, and decreased coordination.  Pt demonstrating slow speed with toe tapping.  Additionally, focused on habituation exercises and encouraged pt to perform on her own in supine. Continue to progress as able.     Follow Up Recommendations  CIR;Supervision/Assistance - 24 hour     Equipment Recommendations  Rolling walker with 5" wheels    Recommendations for Other Services       Precautions / Restrictions Precautions Precautions: Fall    Mobility  Bed Mobility Overal bed mobility: Needs Assistance Bed Mobility: Supine to Sit;Sit to Supine     Supine to sit: Min assist Sit to supine: Supervision   General bed mobility comments: Min A to lift trunk; increased time for all; cues for focus point to limit dizziness    Transfers Overall transfer level: Needs assistance Equipment used: Rolling walker (2 wheeled) Transfers: Sit to/from Stand Sit to Stand: Min assist Stand pivot transfers: Min assist       General transfer comment: Performed x 3 (bed x 2, toielt x 1) with cues for hand placement and min A to rise  Ambulation/Gait Ambulation/Gait assistance: Min assist Gait Distance (Feet): 45 Feet  (12' then 45') Assistive device: Rolling walker (2 wheeled) Gait Pattern/deviations: Step-to pattern;Decreased stride length;Shuffle Gait velocity: .11 ft/sec Gait velocity interpretation: <1.8 ft/sec, indicate of risk for recurrent falls General Gait Details: Ambulated to bathroom then out in hall.  Pt with very slow gait pattern and unable to increase speed (initially reports due to dizziness but then demonstrating slow coordination later during session).  Pt cued for focus point and demonstrating segmental turns to limit dizziness   Stairs             Wheelchair Mobility    Modified Rankin (Stroke Patients Only)       Balance Overall balance assessment: Needs assistance   Sitting balance-Leahy Scale: Good     Standing balance support: No upper extremity supported Standing balance-Leahy Scale: Fair Standing balance comment: RW for ambulation but able to wash hands at sink without support                            Cognition Arousal/Alertness: Awake/alert Behavior During Therapy: WFL for tasks assessed/performed Overall Cognitive Status: Within Functional Limits for tasks assessed                                        Exercises Other Exercises Other Exercises: Toe tapping floor together and alternating - 10 x each, encouraged to focus on increasing speed.  Also, encouraged to do this in supine.  General Comments General comments (skin integrity, edema, etc.): Pt reporting dizziness with movement and demonstrates slow cautious movement.  Tested EOEM and noted nystagmus and some breaks in smooth pursuit, pt reports dizziness when moving eyes downward/particularly diagonal down.  Performed x 5 all directions.  Also, performed gaze stabilization with small head turns 5 x R/L and Up/Down (within no pain limit from surgical site) pt performed slowly.  Performed 5 x head turns and nods (again within no pain at surical site limit) without gaze  stabilization.  Pt with very slow movements and reporting mild dizziness with all.  Educated on habituation exercises and encouraged to perform 3 x day in supine - stressed only within no pain range at surgical site.      Pertinent Vitals/Pain Pain Assessment: 0-10 Pain Score: 3  Pain Location: head Pain Descriptors / Indicators: Pressure Pain Intervention(s): Repositioned;Monitored during session    Home Living Family/patient expects to be discharged to:: Private residence Living Arrangements: Children Available Help at Discharge: Family Type of Home: House Home Access: Stairs to enter Entrance Stairs-Rails: Right Home Layout: Two level;Bed/bath upstairs Home Equipment: None      Prior Function Level of Independence: Independent          PT Goals (current goals can now be found in the care plan section) Acute Rehab PT Goals Patient Stated Goal: To feel better PT Goal Formulation: With patient/family Time For Goal Achievement: 02/23/21 Potential to Achieve Goals: Good Progress towards PT goals: Progressing toward goals    Frequency    Min 3X/week      PT Plan Current plan remains appropriate    Co-evaluation              AM-PAC PT "6 Clicks" Mobility   Outcome Measure  Help needed turning from your back to your side while in a flat bed without using bedrails?: A Little Help needed moving from lying on your back to sitting on the side of a flat bed without using bedrails?: A Little Help needed moving to and from a bed to a chair (including a wheelchair)?: A Little Help needed standing up from a chair using your arms (e.g., wheelchair or bedside chair)?: A Little Help needed to walk in hospital room?: A Little Help needed climbing 3-5 steps with a railing? : A Little 6 Click Score: 18    End of Session Equipment Utilized During Treatment: Gait belt Activity Tolerance: Patient limited by fatigue Patient left: in bed;with call bell/phone within reach;with  family/visitor present (daughter has been helping pt up in room at times - left alarm off, daughter aware and will notify staff if she leaves; additionally pt following commands and not attempting to get up on her own) Nurse Communication: Mobility status PT Visit Diagnosis: Other abnormalities of gait and mobility (R26.89);Other symptoms and signs involving the nervous system (R29.898);Dizziness and giddiness (R42)     Time: 1240-1315 PT Time Calculation (min) (ACUTE ONLY): 35 min  Charges:  $Gait Training: 8-22 mins $Neuromuscular Re-education: 8-22 mins                     Abran Richard, PT Acute Rehab Services Pager [phone] [facility] 02/10/2021, 1:57 PM

## 2021-02-10 NOTE — Discharge Summary (Signed)
Physician Discharge Summary  Patient ID: Teresa [name] MRN: [id number] DOB/AGE: 1951/06/01 70 y.o.  Admit date: 02/06/2021 Discharge date: 02/10/2021  Admission Diagnoses: Left cerebellar metastatic cancer from the breast  Discharge Diagnoses: Left cerebellar metastatic cancer from the breast Active Problems:   Brain tumor [facility])   Discharged Condition: fair  Hospital Course: Patient was admitted to undergo surgical resection of the left cerebellar mass.  She tolerated surgery well.  She has some functional deficits and is being referred for rehab.  Consults: None and rehabilitation medicine  Significant Diagnostic Studies: None  Treatments: surgery: See op note  Discharge Exam: Blood pressure 115/67, pulse (!) 53, temperature 97.8 F (36.6 C), temperature source Oral, resp. rate 16, height 5\' 1"  (1.549 m), weight 68.9 kg, SpO2 94 %. Incision is clean and dry Station and gait are moderately impaired  Disposition: Discharge disposition: [place] Not Defined       Discharge Instructions    Diet - low sodium heart healthy   Complete by: As directed    Increase activity slowly   Complete by: As directed    No wound care   Complete by: As directed      Allergies as of 02/10/2021   No Known Allergies     Medication List    TAKE these medications   B-complex with vitamin C tablet Take 1 tablet by mouth daily after lunch.   magnesium oxide 400 MG tablet Commonly known as: MAG-OX Take 400 mg by mouth daily after lunch.   naproxen sodium 220 MG tablet Commonly known as: ALEVE Take 440 mg by mouth 2 (two) times daily as needed (pain).   OMEGA 3 PO Take 2 capsules by mouth daily after lunch.   Potassium 99 MG Tabs Take 99 mg by mouth daily after lunch.   Turmeric Curcumin Caps Take 2 capsules by mouth daily after lunch.   VITAMIN D3 PO Take 1 tablet by mouth daily after lunch.        Signed: Blanchie Dessert Advik [place] 02/10/2021,  11:46 AM

## 2021-02-10 NOTE — PMR Pre-admission (Signed)
PMR Admission Coordinator Pre-Admission Assessment  Patient: Teresa [name] is an 70 y.o., female MRN: [id number] DOB: 05-Dec-1950 Height: _0  (154.9 cm) Weight: 68.9 kg  Insurance Information HMO:     PPO:      PCP:      IPA:      80/20:      OTHER:  PRIMARY: uninsured      Policy#:       Subscriber:  CM Name:       Phone#:      Fax#:  Pre-Cert#:       Employer:  Benefits:  Phone #:      Name:  Eff. Date:      Deduct:       Out of Pocket Max:       Life Max:  CIR:       SNF:  Outpatient:      Co-Pay:  Home Health:       Co-Pay:  DME:      Co-Pay:  Providers:  SECONDARY:       Policy#:      Phone#:   Development worker, community:       Phone#:   The Engineer, petroleum" for patients in Inpatient Rehabilitation Facilities with attached "Privacy Act [place] Records" was provided and verbally reviewed with: N/A  Emergency Contact Information Contact Information    Name Relation Home Work Mobile   [place] Daughter [phone]        Current Medical History  Patient Admitting Diagnosis: metastatic brain cancer  History of Present Illness: Teresa [name] is a 70 year old right-handed Hispanic female with history of breast cancer, status post breast lumpectomy 2015, including previous spread of disease into her lungs, as well as history of tobacco use.  She last received her 24 cycle of TDM-1 late August 2021 followed by Dr. Lavera Guise.  Presented 02/06/2021 with persistent headache nausea and unsteady gait.  CT of the brain and MRI imaging demonstrated a left cerebellar tumor consistent with metastasis.  Patient underwent left suboccipital craniectomy for gross total resection of left cerebellar tumor using microdissection 02/06/2021 per Dr. Newman Pies.  Maintained on Decadron protocol.  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  Awaiting followed by oncology services.  Tolerating a regular  diet.  Therapy evaluations completed due to patient's decreased functional mobility was recommended for a comprehensive rehab program    Patient's medical record from [facility] has been reviewed by the rehabilitation admission coordinator and physician.  Past Medical History  Past Medical History:  Diagnosis Date  . Arthritis   . Lung cancer ([place]) 2015    Family History   family history is not on file.  Prior Rehab/Hospitalizations Has the patient had prior rehab or hospitalizations prior to admission? No  Has the patient had major surgery during 100 days prior to admission? Yes   Current Medications  Current Facility-Administered Medications:  .  acetaminophen (TYLENOL) tablet 650 mg, 650 mg, Oral, Q4H PRN, 650 mg at 02/07/21 1144 **OR** acetaminophen (TYLENOL) suppository 650 mg, 650 mg, Rectal, Q4H PRN, Newman Pies, MD .  Chlorhexidine Gluconate Cloth 2 % PADS 6 each, 6 each, Topical, Daily, Newman Pies, MD, 6 each at 02/10/21 1026 .  [COMPLETED] dexamethasone (DECADRON) injection 6 mg, 6 mg, Intravenous, Q6H, 6 mg at 02/07/21 1144 **FOLLOWED BY** [COMPLETED] dexamethasone (DECADRON) injection 4 mg, 4 mg, Intravenous, Q6H, 4 mg at 02/08/21 1215 **FOLLOWED BY** dexamethasone (DECADRON) injection 4 mg,  4 mg, Intravenous, Q8H, Newman Pies, MD, 4 mg at 02/10/21 0650 .  docusate sodium (COLACE) capsule 100 mg, 100 mg, Oral, BID, Newman Pies, MD, 100 mg at 02/10/21 1026 .  HYDROcodone-acetaminophen (NORCO/VICODIN) 5-325 MG per tablet 1-2 tablet, 1-2 tablet, Oral, Q4H PRN, Newman Pies, MD, 2 tablet at 02/09/21 2222 .  labetalol (NORMODYNE) injection 10-40 mg, 10-40 mg, Intravenous, Q10 min PRN, Newman Pies, MD .  morphine 2 MG/ML injection 1-2 mg, 1-2 mg, Intravenous, Q2H PRN, Newman Pies, MD, 2 mg at 02/08/21 1745 .  ondansetron (ZOFRAN) tablet 4 mg, 4 mg, Oral, Q4H PRN **OR** ondansetron (ZOFRAN) injection 4 mg, 4 mg, Intravenous, Q4H PRN,  Newman Pies, MD .  pantoprazole (PROTONIX) EC tablet 40 mg, 40 mg, Oral, QHS, Newman Pies, MD, 40 mg at 02/09/21 2222 .  polyethylene glycol (MIRALAX / GLYCOLAX) packet 17 g, 17 g, Oral, BID PRN, Newman Pies, MD, 17 g at 02/09/21 0857 .  promethazine (PHENERGAN) tablet 12.5-25 mg, 12.5-25 mg, Oral, Q4H PRN, Newman Pies, MD  Patients Current Diet:  Diet Order            Diet regular Room service appropriate? Yes; Fluid consistency: Thin  Diet effective now                 Precautions / Restrictions Precautions Precautions: Fall Restrictions Weight Bearing Restrictions: No   Has the patient had 2 or more falls or a fall with injury in the past year? No  Prior Activity Level Community (5-7x/wk): was taking family members to the cancer center prior to decline, no DME used  Prior Functional Level Self Care: Did the patient need help bathing, dressing, using the toilet or eating? Independent  Indoor Mobility: Did the patient need assistance with walking from room to room (with or without device)? Independent  Stairs: Did the patient need assistance with internal or external stairs (with or without device)? Independent  Functional Cognition: Did the patient need help planning regular tasks such as shopping or remembering to take medications? Independent  Home Assistive Devices / Equipment Home Assistive Devices/Equipment: Eyeglasses Home Equipment: None  Prior Device Use: Indicate devices/aids used by the patient prior to current illness, exacerbation or injury? None of the above  Current Functional Level Cognition  Overall Cognitive Status: Within Functional Limits for tasks assessed Orientation Level: Oriented X4    Extremity Assessment (includes Sensation/Coordination)  Upper Extremity Assessment: Overall WFL for tasks assessed  Lower Extremity Assessment: Defer to PT evaluation    ADLs  Overall ADL's : Needs assistance/impaired Eating/Feeding:  Independent,Bed level,Sitting Grooming: Wash/dry hands,Wash/dry face,Oral care,Brushing hair,Minimal assistance,Standing Grooming Details (indicate cue type and reason): assist to steady Upper Body Bathing: Set up,Supervision/ safety,Sitting Lower Body Bathing: Moderate assistance,Sit to/from stand Lower Body Bathing Details (indicate cue type and reason): mod A for sit to stand.  Difficulty accessing Rt foot Upper Body Dressing : Supervision/safety,Set up,Sitting Lower Body Dressing: Moderate assistance,Sit to/from stand Lower Body Dressing Details (indicate cue type and reason): difficulty accessing Rt foot and mod A for balance Toilet Transfer: Moderate assistance,Ambulation,Comfort height toilet,Grab bars Toilet Transfer Details (indicate cue type and reason): assist for sit to stand Toileting- Clothing Manipulation and Hygiene: Minimal assistance,Sit to/from stand Functional mobility during ADLs: Moderate assistance General ADL Comments: Pt prefers hand held assist instead of RW    Mobility  Overal bed mobility: Needs Assistance Bed Mobility: Supine to Sit,Sit to Supine Supine to sit: Supervision,HOB elevated Sit to supine: Supervision,HOB elevated General bed mobility comments: reliance  on bedrails    Transfers  Overall transfer level: Needs assistance Equipment used: None Transfers: Sit to/from [employer] Sit to Stand: Mod assist Stand pivot transfers: Min assist General transfer comment: mod A to power up from lower surface such as toilet and assist for balance    Ambulation / Gait / Stairs / Wheelchair Mobility  Ambulation/Gait Ambulation/Gait assistance: Herbalist (Feet): 45 Feet Assistive device: Rolling walker (2 wheeled),1 person hand held assist Gait Pattern/deviations: Step-through pattern,Decreased stride length,Shuffle,Wide base of support General Gait Details: initially with HHA, then pt very slow and reaching for another hand so  stopped and got walker, then with cues for proximity to walker and need for slow turns and visual target due to c/o dizziness    Posture / Balance Balance Overall balance assessment: Needs assistance Sitting balance-Leahy Scale: Good Standing balance support: No upper extremity supported Standing balance-Leahy Scale: Fair Standing balance comment: able to maintain static standing with min guard assist    Special needs/care consideration Special service needs spanish interpreter   Previous Home Environment (from acute therapy documentation) Living Arrangements: Children Available Help at Discharge: Family Type of Home: House Home Layout: Two level,Bed/bath upstairs Home Access: Stairs to enter Entrance Stairs-Rails: Right Entrance Stairs-Number of Steps: 15 Bathroom Shower/Tub: Chiropodist: [place]: No  Discharge Living Setting Plans for Discharge Living Setting: Lives with (comment) (daughter) Type of Home at Discharge: House Discharge Home Layout: Two level,Bed/bath upstairs Alternate Level Stairs-Rails: Right Alternate Level Stairs-Number of Steps: full flight Discharge Home Access: Level entry Discharge Bathroom Shower/Tub: Tub/shower unit Discharge Bathroom Toilet: Standard Discharge Bathroom Accessibility: Yes How Accessible: Accessible via walker Does the patient have any problems obtaining your medications?: Yes (Describe) (uninsured)  Social/Family/Support Systems Anticipated Caregiver: daughter, Loree Fee Anticipated Caregiver's Contact Information: [phone] Ability/Limitations of Caregiver: n/a Caregiver Availability: 24/7 Discharge Plan Discussed with Primary Caregiver: Yes Is Caregiver In Agreement with Plan?: Yes Does Caregiver/Family have Issues with Lodging/Transportation while Pt is in Rehab?: No  Goals Patient/Family Goal for Rehab: PT/OT/SLP supervision Expected length of stay: 14-16 days Cultural  Considerations: needs spanish interpreter Pt/Family Agrees to Admission and willing to participate: Yes Program Orientation Provided & Reviewed with Pt/Caregiver Including Roles  & Responsibilities: Yes  Decrease burden of Care through IP rehab admission: n/a Possible need for SNF placement upon discharge: Not anticipated.   Patient Condition: I have reviewed medical records from [facility], spoken with CM, and patient and daughter. I met with patient at the bedside for inpatient rehabilitation assessment.  Patient will benefit from ongoing PT, OT and SLP, can actively participate in 3 hours of therapy a day 5 days of the week, and can make measurable gains during the admission.  Patient will also benefit from the coordinated team approach during an Inpatient Acute Rehabilitation admission.  The patient will receive intensive therapy as well as Rehabilitation physician, nursing, social worker, and care management interventions.  Due to safety, disease management, medication administration, pain management and patient education the patient requires 24 hour a day rehabilitation nursing.  The patient is currently min to mod assist with mobility and basic ADLs.  Discharge setting and therapy post discharge at home is anticipated.  Patient has agreed to participate in the Acute Inpatient Rehabilitation Program and will admit today.  Preadmission Screen Completed By:  Michel Santee, PT, DPT 02/10/2021 11:42 AM ______________________________________________________________________   Discussed status with Dr. Ranell Patrick  on 02/10/21  at 11:45 AM  and received approval for admission today.  Admission Coordinator:  Michel Santee, PT, DPT, time 11:45 AM Sudie Grumbling 02/10/21    Assessment/Plan: Diagnosis: Brain metastasis from primary breast cancer 1. Does the need for close, 24 hr/day Medical supervision in concert with the patient's rehab needs make it unreasonable for this patient to be served in a less  intensive setting? Yes 2. Co-Morbidities requiring supervision/potential complications:  1. Overweight BMI 28.72 2. Hyperglycemia 3. Anemia 4. Bradycardia 5. S/p left suboccipital craniectomy 3. Due to bladder management, bowel management, safety, skin/wound care, disease management, medication administration, pain management and patient education, does the patient require 24 hr/day rehab nursing? Yes 4. Does the patient require coordinated care of a physician, rehab nurse, PT, OT to address physical and functional deficits in the context of the above medical diagnosis(es)? Yes Addressing deficits in the following areas: balance, endurance, locomotion, strength, transferring, bowel/bladder control, bathing, dressing, feeding, grooming, toileting and psychosocial support 5. Can the patient actively participate in an intensive therapy program of at least 3 hrs of therapy 5 days a week? Yes 6. The potential for patient to make measurable gains while on inpatient rehab is excellent 7. Anticipated functional outcomes upon discharge from inpatient rehab: modified independent PT, modified independent OT, independent SLP 8. Estimated rehab length of stay to reach the above functional goals is: 7-10 days 9. Anticipated discharge destination: Home 10. Overall Rehab/Functional Prognosis: excellent   MD Signature: Leeroy Cha, MD

## 2021-02-10 NOTE — Progress Notes (Signed)
Inpatient Rehabilitation Medication Review by a Pharmacist  A complete drug regimen review was completed for this patient to identify any potential clinically significant medication issues.  Clinically significant medication issues were identified:  no  Check AMION for pharmacist assigned to patient if future medication questions/issues arise during this admission.  Pharmacist comments:   Time spent performing this drug regimen review (minutes):  5   Akilah Cureton A. Levada Dy, PharmD, BCPS, FNKF Clinical Pharmacist [place] Please utilize Amion for appropriate phone number to reach the unit pharmacist ([place])  02/10/2021 6:59 PM

## 2021-02-10 NOTE — Progress Notes (Signed)
Rechecked BP and called Josh, RN about her soft BP. See flowsheet. Pt belongings gathered by daughter.

## 2021-02-10 NOTE — TOC Transition Note (Signed)
Transition of Care [facility]) - CM/SW Discharge Note   Patient Details  Name: Teresa [name] MRN: [id number] Date of Birth: 06/20/1951  [facility] CM/SW Contact:  Pollie Friar, RN Phone Number: 02/10/2021, 12:09 PM   Clinical Narrative:    Patient is discharging to CIR today. CM signing off.   Final next level of care: IP Rehab Facility Barriers to Discharge: No Barriers Identified   Patient Goals and CMS Choice        Discharge Placement                       Discharge Plan and Services                                     Social Determinants of Health (SDOH) Interventions     Readmission Risk Interventions No flowsheet data found.

## 2021-02-10 NOTE — Plan of Care (Signed)

## 2021-02-10 NOTE — H&P (Signed)
Physical Medicine and Rehabilitation Admission H&P   CC: brain metastasis  HPI: Teresa [name] is a 70 year old right-handed Hispanic female with history of breast cancer status post breast lumpectomy 2015 including previous spread of disease into her lungs as well as history of tobacco use.  She last received her 24 cycle of TDM-1 late August 2021 followed by Dr. Lavera Guise.  Per chart review she lives with her daughter.  Reportedly independent prior to admission.  Two-level home bed and bath upstairs.  Presented 02/06/2021 with persistent headache nausea and unsteady gait.  CT of the brain and MRI imaging demonstrated a left cerebellar tumor consistent with metastasis.  Patient underwent left suboccipital craniectomy for gross total resection of left cerebellar tumor using microdissection 02/06/2021 per Dr. Newman Pies.  Maintained on Decadron protocol.  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  Awaiting followed by oncology services.  Tolerating a regular diet.  Therapy evaluations completed due to patient's decreased functional mobility was admitted for a comprehensive rehab program. She is currently alert and oriented x3.   Review of Systems  Constitutional: Negative for chills and fever.  HENT: Negative for hearing loss.   Eyes: Negative for blurred vision and double vision.  Respiratory: Negative for cough and shortness of breath.   Cardiovascular: Negative for chest pain, palpitations and leg swelling.  Gastrointestinal: Positive for constipation. Negative for heartburn, nausea and vomiting.  Genitourinary: Negative for dysuria, flank pain and hematuria.  Musculoskeletal: Positive for myalgias.  Skin: Negative for rash.  Neurological: Positive for dizziness, weakness and headaches.  All other systems reviewed and are negative.  Past Medical History:  Diagnosis Date  . Arthritis   . Lung cancer ([place]) 2015   Past  Surgical History:  Procedure Laterality Date  . APPLICATION OF CRANIAL NAVIGATION N/A 02/06/2021   Procedure: APPLICATION OF CRANIAL NAVIGATION;  Surgeon: Newman Pies, MD;  Location: [place];  Service: Neurosurgery;  Laterality: N/A;  . BREAST LUMPECTOMY Left 2015  . BREAST SURGERY Left    tumor removal  . CRANIOTOMY N/A 02/06/2021   Procedure: Suboccipital Craniectomy for Tumor Resection with Brainlab Navigation;  Surgeon: Newman Pies, MD;  Location: [place];  Service: Neurosurgery;  Laterality: N/A;  . TUBAL LIGATION     History reviewed. No pertinent family history. Social History:  reports that she has quit smoking. Her smoking use included cigarettes. She has never used smokeless tobacco. She reports previous alcohol use. She reports that she does not use drugs. Allergies: No Known Allergies Medications Prior to Admission  Medication Sig Dispense Refill  . B Complex-C (B-COMPLEX WITH VITAMIN C) tablet Take 1 tablet by mouth daily after lunch.    . Cholecalciferol (VITAMIN D3 PO) Take 1 tablet by mouth daily after lunch.    . magnesium oxide (MAG-OX) 400 MG tablet Take 400 mg by mouth daily after lunch.    . Misc Natural Products (TURMERIC CURCUMIN) CAPS Take 2 capsules by mouth daily after lunch.    . naproxen sodium (ALEVE) 220 MG tablet Take 440 mg by mouth 2 (two) times daily as needed (pain).    . Omega-3 Fatty Acids (OMEGA 3 PO) Take 2 capsules by mouth daily after lunch.    . Potassium 99 MG TABS Take 99 mg by mouth daily after lunch.      Drug Regimen Review Drug regimen was reviewed and remains appropriate with no significant issues identified  Home: Home Living Family/patient expects to be discharged to::  Private residence Living Arrangements: Children Available Help at Discharge: Family Type of Home: House Home Access: Stairs to enter Technical brewer of Steps: 15 Entrance Stairs-Rails: Right Home Layout: Two level,Bed/bath upstairs Bathroom Shower/Tub:  Chiropodist: Standard Home Equipment: None   Functional History: Prior Function Level of Independence: Independent  Functional Status:  Mobility: Bed Mobility Overal bed mobility: Needs Assistance Bed Mobility: Supine to Sit Supine to sit: Min assist,HOB elevated General bed mobility comments: assist for lifting trunk Transfers Overall transfer level: Needs assistance Equipment used: None Transfers: Sit to/from Stand Sit to Stand: Min assist General transfer comment: assist for balance, Ambulation/Gait Ambulation/Gait assistance: Min Web designer (Feet): 45 Feet Assistive device: Rolling walker (2 wheeled),1 person hand held assist Gait Pattern/deviations: Step-through pattern,Decreased stride length,Shuffle,Wide base of support General Gait Details: initially with HHA, then pt very slow and reaching for another hand so stopped and got walker, then with cues for proximity to walker and need for slow turns and visual target due to c/o dizziness  ADL:  Cognition: Cognition Overall Cognitive Status: Within Functional Limits for tasks assessed Orientation Level: Oriented X4 Cognition Arousal/Alertness: Awake/alert Behavior During Therapy: WFL for tasks assessed/performed Overall Cognitive Status: Within Functional Limits for tasks assessed   Physical Exam: Blood pressure 102/62, pulse (!) 52, temperature 97.8 F (36.6 C), temperature source Oral, resp. rate 19, height 5\' 1"  (1.549 m), weight 68 kg, SpO2 95 %. Physical Exam Gen: no distress, normal appearing HEENT: oral mucosa pink and moist, NCAT Cardio: Bradycardic Chest: normal effort, normal rate of breathing Abd: soft, non-distended Ext: no edema Psych: pleasant, normal affect Skin: intact Neurological:     Comments: Patient is alert and oriented x3.  Non-English-speaking female.  No acute distress.  Follows simple commands.  Daughter was at bedside.  Patient answers questions  appropriately through her daughter translating. 5/5 strength throughout    No results found for this or any previous visit (from the past 48 hour(s)). No results found.     Medical Problem List and Plan: 1.  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  Status post left suboccipital craniectomy gross total resection on 02/06/2021.  Decadron protocol taper  -patient may shower but incision must be covered  -ELOS/Goals: modI 7-10 days  -Admit to CIR 2.  Impaired mobility -DVT/anticoagulation: Continue SCDs  -antiplatelet therapy: N/A 3. Postoperative pain: Hydrocodone as needed. D/c IV morphine.  4. Mood: Provide emotional support  -antipsychotic agents: N/A 5. Neuropsych: This patient is capable of making decisions on her own behalf. 6. Skin/Wound Care: Routine skin checks 7. Fluids/Electrolytes/Nutrition: Routine in and outs with follow-up chemistries 8.  Constipation.  Colace 100 mg twice daily, MiraLAX as needed. 9.  Tobacco use.  Counseling 10. Overweight BMI 28.72: provide counseling  I have personally performed a face to face diagnostic evaluation, including, but not limited to relevant history and physical exam findings, of this patient and developed relevant assessment and plan.  Additionally, I have reviewed and concur with the physician assistant's documentation above.  Leeroy Cha, MD  Lavon Paganini [place], PA-C 02/10/2021

## 2021-02-11 DIAGNOSIS — D496 Neoplasm of unspecified behavior of brain: Secondary | ICD-10-CM

## 2021-02-11 MED ORDER — HYDROCODONE-ACETAMINOPHEN 5-325 MG PO TABS: 1.0000 | ORAL_TABLET | ORAL | Status: DC | PRN

## 2021-02-11 NOTE — Progress Notes (Signed)
PROGRESS NOTE   Subjective/Complaints: No complaints this morning. Bradycardic to 51 Participated well with SLP today  ROS: denies pain  Objective:   No results found. No results for input(s): WBC, HGB, HCT, PLT in the last 72 hours. No results for input(s): NA, K, CL, CO2, GLUCOSE, BUN, CREATININE, CALCIUM in the last 72 hours.  Intake/Output Summary (Last 24 hours) at 02/11/2021 1204 Last data filed at 02/11/2021 0816 Gross per 24 hour  Intake 370 ml  Output --  Net 370 ml        Physical Exam: Vital Signs Blood pressure 108/70, pulse (!) 51, temperature 98.3 F (36.8 C), temperature source Oral, resp. rate 18, height 5\' 1"  (1.549 m), weight 68 kg, SpO2 95 %. Gen: no distress, normal appearing HEENT: oral mucosa pink and moist, NCAT Cardio: Bradycardic Chest: normal effort, normal rate of breathing Abd: soft, non-distended Ext: no edema Psych: pleasant, normal affect Skin: intact Neurological:     Comments: Patient is alert and oriented x3.  Non-English-speaking female.  No acute distress.  Follows simple commands.  Daughter was at bedside.  Patient answers questions appropriately through her daughter translating. 5/5 strength throughout. Impaired memory.   Assessment/Plan: 1. Functional deficits which require 3+ hours per day of interdisciplinary therapy in a comprehensive inpatient rehab setting.  Physiatrist is providing close team supervision and 24 hour management of active medical problems listed below.  Physiatrist and rehab team continue to assess barriers to discharge/monitor patient progress toward functional and medical goals  Care Tool:  Bathing              Bathing assist       Upper Body Dressing/Undressing Upper body dressing        Upper body assist      Lower Body Dressing/Undressing Lower body dressing            Lower body assist       Toileting Toileting     Toileting assist       Transfers Chair/bed transfer  Transfers assist           Locomotion Ambulation   Ambulation assist              Walk 10 feet activity   Assist           Walk 50 feet activity   Assist           Walk 150 feet activity   Assist           Walk 10 feet on uneven surface  activity   Assist           Wheelchair     Assist               Wheelchair 50 feet with 2 turns activity    Assist            Wheelchair 150 feet activity     Assist          Blood pressure 108/70, pulse (!) 51, temperature 98.3 F (36.8 C), temperature source Oral, resp. rate 18, height 5\' 1"  (1.549 m), weight 68 kg, SpO2 95 %.  Medical Problem List and  Plan: 1.  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  Status post left suboccipital craniectomy gross total resection on 02/06/2021.  Decadron protocol taper             -patient may shower but incision must be covered             -ELOS/Goals: modI 7-10 days             Initial CIR evals today 2.  Impaired mobility -DVT/anticoagulation: Continue SCDs             -antiplatelet therapy: N/A 3. Postoperative pain: Decrease Norco to 1 tab q4H prn. D/c IV morphine.  4. Mood: Provide emotional support             -antipsychotic agents: N/A 5. Neuropsych: This patient is capable of making decisions on her own behalf. 6. Skin/Wound Care: Routine skin checks 7. Fluids/Electrolytes/Nutrition: Routine in and outs with follow-up chemistries 8.  Constipation.  Colace 100 mg twice daily, MiraLAX as needed. 9.  Tobacco use.  Counseling 10. Overweight BMI 28.72-->28.34: provide counseling 11. Impaired memory: continue SLP.  12. S/p left subocciptal craniectomy: on decadron 4mg  q8H. Please f/u with NSGY on Monday regarding taper     LOS: 1 days A FACE TO FACE EVALUATION WAS PERFORMED  Martha Clan P  Fendi Meinhardt 02/11/2021, 12:04 PM

## 2021-02-11 NOTE — Evaluation (Signed)
Occupational Therapy Assessment and Plan  Patient Details  Name: Teresa [name] MRN: [id number] Date of Birth: Aug 30, 1951  OT Diagnosis: abnormal posture, altered mental status and muscle weakness (generalized) Rehab Potential:   ELOS: 7-10 days   Today's Date: 02/11/2021 OT Individual Time: 1300-1355 OT Individual Time Calculation (min): 55 min     Hospital Problem: Principal Problem:   Brain tumor [facility])   Past Medical History:  Past Medical History:  Diagnosis Date  . Arthritis   . Lung cancer ([place]) 2015   Past Surgical History:  Past Surgical History:  Procedure Laterality Date  . APPLICATION OF CRANIAL NAVIGATION N/A 02/06/2021   Procedure: APPLICATION OF CRANIAL NAVIGATION;  Surgeon: Newman Pies, MD;  Location: [facility];  Service: Neurosurgery;  Laterality: N/A;  . BREAST LUMPECTOMY Left 2015  . BREAST SURGERY Left    tumor removal  . CRANIOTOMY N/A 02/06/2021   Procedure: Suboccipital Craniectomy for Tumor Resection with Brainlab Navigation;  Surgeon: Newman Pies, MD;  Location: [place];  Service: Neurosurgery;  Laterality: N/A;  . TUBAL LIGATION      Assessment & Plan Clinical Impression: Teresa [name] is a 70 year old right-handed Hispanic female with history of breast cancer status post breast lumpectomy 2015 including previous spread of disease into her lungs as well as history of tobacco use.  She last received her 24 cycle of TDM-1 late August 2021 followed by Dr. Lavera Guise.  Per chart review she lives with her daughter.  Reportedly independent prior to admission.  Two-level home bed and bath upstairs.  Presented 02/06/2021 with persistent headache nausea and unsteady gait.  CT of the brain and MRI imaging demonstrated a left cerebellar tumor consistent with metastasis.  Patient underwent left suboccipital craniectomy for gross total resection of left cerebellar tumor using microdissection 02/06/2021 per Dr. Newman Pies.  Maintained on  Decadron protocol.  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  Awaiting followed by oncology services.  Tolerating a regular diet.  Therapy evaluations completed due to patient's decreased functional mobility was admitted for a comprehensive rehab program. She is currently alert and oriented x3.   Patient transferred to CIR on 02/10/2021 .    Patient currently requires min / CGA with basic self-care skills secondary to muscle weakness, decreased cardiorespiratoy endurance, impaired timing and sequencing, unbalanced muscle activation, decreased coordination and decreased motor planning and decreased standing balance and decreased balance strategies.  Prior to hospitalization, patient could complete BADL and IADL with independent .  Patient will benefit from skilled intervention to decrease level of assist with basic self-care skills and increase independence with basic self-care skills prior to discharge home with care partner.  Anticipate patient will require 24 hour supervision and follow up home health.  OT - End of Session Activity Tolerance: Tolerates 30+ min activity with multiple rests Endurance Deficit: Yes Endurance Deficit Description: 7-10 days OT Assessment OT Patient demonstrates impairments in the following area(s): Balance;Cognition;Endurance;Motor;Perception;Safety;Vision OT Basic ADL's Functional Problem(s): Grooming;Bathing;Dressing;Toileting OT Transfers Functional Problem(s): Toilet;Tub/Shower OT Additional Impairment(s): None OT Plan OT Intensity: Minimum of 1-2 x/day, 45 to 90 minutes OT Frequency: 5 out of 7 days OT Duration/Estimated Length of Stay: 7-10 days OT Treatment/Interventions: Balance/vestibular training;Discharge planning;Functional electrical stimulation;Pain management;Self Care/advanced ADL retraining;Therapeutic Activities;UE/LE Coordination activities;Visual/perceptual remediation/compensation;Therapeutic  Exercise;Skin care/wound managment;Patient/family education;Functional mobility training;Disease mangement/prevention;Cognitive remediation/compensation;Community reintegration;DME/adaptive equipment instruction;Psychosocial support;Neuromuscular re-education;UE/LE Strength taining/ROM OT Self Feeding Anticipated Outcome(s): no goal OT Basic Self-Care Anticipated Outcome(s): Supervision OT Toileting Anticipated Outcome(s): Supervision OT Bathroom Transfers Anticipated  Outcome(s): Supervision OT Recommendation Patient destination: Home Follow Up Recommendations: Home health OT Equipment Recommended: To be determined   OT Evaluation Precautions/Restrictions  Precautions Precautions: Fall Restrictions Weight Bearing Restrictions: No Other Position/Activity Restrictions: ROM to c/spine as tolerated Vital Signs Therapy Vitals Temp: 98 F (36.7 C) Temp Source: Oral Pulse Rate: 64 Resp: 18 BP: 96/69 Patient Position (if appropriate): Sitting Oxygen Therapy SpO2: 96 % O2 Device: Room Air Pain Pain Assessment Pain Scale: 0-10 Pain Score: 0-No pain Home Living/Prior Functioning Home Living Living Arrangements: Children Available Help at Discharge: Family Type of Home: House Home Access: Stairs to enter Technical brewer of Steps: 15 Entrance Stairs-Rails: Right Home Layout: Two level,Bed/bath upstairs Bathroom Shower/Tub: Chiropodist: Standard  Lives With: Daughter Prior Function Level of Independence: Independent with basic ADLs,Independent with homemaking with ambulation,Independent with gait,Independent with transfers  Able to Take Stairs?: Yes Comments: per daughter likes to cook and do home making tasks Vision Baseline Vision/History: Wears glasses Wears Glasses: Reading only Patient Visual Report: No change from baseline Vision Assessment?:  (mild difficulty with peripheral vision) Eye Alignment: Within Functional Limits Perception   Perception: Within Functional Limits Praxis Praxis: Intact Cognition Overall Cognitive Status: Impaired/Different from baseline Arousal/Alertness: Awake/alert Orientation Level: Person;Situation Person: Oriented Situation: Oriented Year: 2022 Month: April Day of Week: Correct Memory: Impaired Memory Impairment: Storage deficit Immediate Memory Recall: Sock;Blue;Bed Memory Recall Sock: Without Cue Memory Recall Blue: With Cue Memory Recall Bed: With Cue Attention: Sustained Sustained Attention: Impaired Awareness: Impaired Problem Solving: Impaired Problem Solving Impairment: Verbal complex;Functional complex Executive Function: Organizing Reasoning: Impaired Reasoning Impairment: Verbal complex;Functional complex Safety/Judgment: Appears intact Sensation Sensation Light Touch: Appears Intact Proprioception: Appears Intact Stereognosis: Appears Intact Coordination Gross Motor Movements are Fluid and Coordinated: Yes Fine Motor Movements are Fluid and Coordinated: Yes Heel Shin Test: Able to perform slowly but limited more so by stiffness than coordination Motor  Motor Motor: Within Functional Limits Motor - Skilled Clinical Observations: grossly intact but moves very slowly d/t stiffness  Trunk/Postural Assessment  Cervical Assessment Cervical Assessment: Within Functional Limits Thoracic Assessment Thoracic Assessment: Exceptions to [facility] Lumbar Assessment Lumbar Assessment: Within Functional Limits Postural Control Postural Control: Within Functional Limits  Balance Balance Balance Assessed: Yes Standardized Balance Assessment Standardized Balance Assessment: Berg Balance Test Berg Balance Test Sit to Stand: Able to stand using hands after several tries Standing Unsupported: Able to stand safely 2 minutes Sitting with Back Unsupported but Feet Supported on Floor or Stool: Able to sit safely and securely 2 minutes Stand to Sit: Controls descent by using  hands Transfers: Able to transfer with verbal cueing and /or supervision Standing Unsupported with Eyes Closed: Unable to keep eyes closed 3 seconds but stays steady Standing Ubsupported with Feet Together: Able to place feet together independently but unable to hold for 30 seconds From Standing, Reach Forward with Outstretched Arm: Can reach forward >5 cm safely (2") From Standing Position, Pick up Object from Floor: Unable to pick up shoe, but reaches 2-5 cm (1-2") from shoe and balances independently From Standing Position, Turn to Look Behind Over each Shoulder: Turn sideways only but maintains balance Turn 360 Degrees: Needs close supervision or verbal cueing Standing Unsupported, Alternately Place Feet on Step/Stool: Needs assistance to keep from falling or unable to try Standing Unsupported, One Foot in Front: Needs help to step but can hold 15 seconds Standing on One Leg: Unable to try or needs assist to prevent fall Total Score: 26 Static Sitting Balance Static Sitting -  Balance Support: No upper extremity supported;Feet supported Static Sitting - Level of Assistance: 6: Modified independent (Device/Increase time) Dynamic Sitting Balance Dynamic Sitting - Balance Support: No upper extremity supported;Feet supported;During functional activity Dynamic Sitting - Level of Assistance: 5: Stand by assistance Dynamic Sitting - Balance Activities: Reaching across midline;Reaching for objects;Forward lean/weight shifting;Lateral lean/weight shifting Static Standing Balance Static Standing - Balance Support: No upper extremity supported;During functional activity Static Standing - Level of Assistance: 5: Stand by assistance Dynamic Standing Balance Dynamic Standing - Balance Support: Bilateral upper extremity supported;During functional activity Dynamic Standing - Level of Assistance: 5: Stand by assistance Dynamic Standing - Balance Activities: Reaching for objects;Reaching across  midline Extremity/Trunk Assessment RUE Assessment RUE Assessment: Within Functional Limits General Strength Comments: 4/5 grossly, slow movements LUE Assessment LUE Assessment: Within Functional Limits General Strength Comments: 4/5 grossly, slow movements  Care Tool Care Tool Self Care Eating     Indep    Oral Care    Oral Care Assist Level: Set up assist    Bathing   Body parts bathed by patient: Right arm;Left arm;Chest;Abdomen;Front perineal area;Buttocks;Right upper leg;Left upper leg;Right lower leg;Left lower leg;Face     Assist Level: Contact Guard/Touching assist    Upper Body Dressing(including orthotics)   What is the patient wearing?: Hospital gown only   Assist Level: Minimal Assistance - Patient > 75%    Lower Body Dressing (excluding footwear)   What is the patient wearing?: Hospital gown only Assist for lower body dressing: Minimal Assistance - Patient > 75%    Putting on/Taking off footwear   What is the patient wearing?: Non-skid slipper socks Assist for footwear: Set up assist       Care Tool Toileting Toileting activity   Assist for toileting: Contact Guard/Touching assist     Care Tool Bed Mobility Roll left and right activity   Roll left and right assist level: Supervision/Verbal cueing    Sit to lying activity   Sit to lying assist level: Supervision/Verbal cueing    Lying to sitting edge of bed activity   Lying to sitting edge of bed assist level: Supervision/Verbal cueing     Care Tool Transfers Sit to stand transfer   Sit to stand assist level: Contact Guard/Touching assist    Chair/bed transfer   Chair/bed transfer assist level: Contact Guard/Touching assist     Toilet transfer   Assist Level: Contact Guard/Touching assist     Care Tool Cognition Expression of Ideas and Wants Expression of Ideas and Wants: Without difficulty (complex and basic) - expresses complex messages without difficulty and with speech that is clear and  easy to understand   Understanding Verbal and Non-Verbal Content Understanding Verbal and Non-Verbal Content: Understands (complex and basic) - clear comprehension without cues or repetitions   Memory/Recall Ability *first 3 days only Memory/Recall Ability *first 3 days only: Current season;That he or she is in a hospital/hospital unit;Staff names and faces    Refer to Care Plan for [place] OT Short Term Goal 1 (Week 1): STGs = LTGs d/t ELOS at Supervision level  Recommendations for other services: None    Skilled Therapeutic Intervention ADL ADL Eating: Not assessed Grooming: Supervision/safety Upper Body Bathing: Supervision/safety Where Assessed-Upper Body Bathing: Sitting at sink Lower Body Bathing: Contact guard Upper Body Dressing: Minimal assistance Lower Body Dressing: Not assessed (declined pants - gown only) Toileting: Contact guard Where Assessed-Toileting: Toilet;Bedside Commode Toilet Transfer: Contact guard Toilet Transfer Method: Ambulating  Science writer: Radiographer, therapeutic: Not assessed Mobility  Bed Mobility Bed Mobility: Rolling Right;Supine to Sit;Sit to Supine Rolling Right: Supervision/verbal cueing Supine to Sit: Contact Guard/Touching assist Sit to Supine: Supervision/Verbal cueing Transfers Sit to Stand: Contact Guard/Touching assist Stand to Sit: Contact Guard/Touching assist    Skilled Interventions: Pt greeted at time of session semireclined in bed agreeable to OT session, no pain but fatigued throughout session. Interpreter present throughout and daughter Verdis Frederickson joining session half way through and remaining for questions and DC planning. See above and below for details for eval.   Supine > sit Supervision and walked CGA with RW with extended time to bathroom, transferred to toilet same manner. CGA hygiene as well and clothing management. Discussed taking shower, declined today and  aware that incision has to be covered for future shower. Declined bathing/dressing LB but simulated and pt CGA with good flexibility able to reach figure four. UB dress Min for gown to tie in the back, CGA for standing balance throughout session and during transfers. Bathing performed at sink level per pt preference and supervision for UB, simulated LB with CGA. Daughter had several questions regarding prognosis, CLOF, expected length of stay, etc. Pt semireclined in bed resting with alarm on call bell in reach.    Discharge Criteria: Patient will be discharged from OT if patient refuses treatment 3 consecutive times without medical reason, if treatment goals not met, if there is a change in medical status, if patient makes no progress towards goals or if patient is discharged from hospital.  The above assessment, treatment plan, treatment alternatives and goals were discussed and mutually agreed upon: by patient  Viona Gilmore 02/11/2021, 4:48 PM

## 2021-02-11 NOTE — Plan of Care (Signed)
  Problem: RH Balance Goal: LTG Patient will maintain dynamic standing with ADLs (OT) Description: LTG:  Patient will maintain dynamic standing balance with assist during activities of daily living (OT)  Flowsheets (Taken 02/11/2021 1640) LTG: Pt will maintain dynamic standing balance during ADLs with: Supervision/Verbal cueing   Problem: Sit to Stand Goal: LTG:  Patient will perform sit to stand in prep for activites of daily living with assistance level (OT) Description: LTG:  Patient will perform sit to stand in prep for activites of daily living with assistance level (OT) Flowsheets (Taken 02/11/2021 1640) LTG: PT will perform sit to stand in prep for activites of daily living with assistance level: Supervision/Verbal cueing   Problem: RH Grooming Goal: LTG Patient will perform grooming w/assist,cues/equip (OT) Description: LTG: Patient will perform grooming with assist, with/without cues using equipment (OT) Flowsheets (Taken 02/11/2021 1640) LTG: Pt will perform grooming with assistance level of: Set up assist    Problem: RH Bathing Goal: LTG Patient will bathe all body parts with assist levels (OT) Description: LTG: Patient will bathe all body parts with assist levels (OT) Flowsheets (Taken 02/11/2021 1640) LTG: Pt will perform bathing with assistance level/cueing: Supervision/Verbal cueing   Problem: RH Dressing Goal: LTG Patient will perform upper body dressing (OT) Description: LTG Patient will perform upper body dressing with assist, with/without cues (OT). Flowsheets (Taken 02/11/2021 1640) LTG: Pt will perform upper body dressing with assistance level of: Set up assist Goal: LTG Patient will perform lower body dressing w/assist (OT) Description: LTG: Patient will perform lower body dressing with assist, with/without cues in positioning using equipment (OT) Flowsheets (Taken 02/11/2021 1640) LTG: Pt will perform lower body dressing with assistance level of: Supervision/Verbal  cueing   Problem: RH Toileting Goal: LTG Patient will perform toileting task (3/3 steps) with assistance level (OT) Description: LTG: Patient will perform toileting task (3/3 steps) with assistance level (OT)  Flowsheets (Taken 02/11/2021 1640) LTG: Pt will perform toileting task (3/3 steps) with assistance level: Supervision/Verbal cueing   Problem: RH Toilet Transfers Goal: LTG Patient will perform toilet transfers w/assist (OT) Description: LTG: Patient will perform toilet transfers with assist, with/without cues using equipment (OT) Flowsheets (Taken 02/11/2021 1640) LTG: Pt will perform toilet transfers with assistance level of: Supervision/Verbal cueing   Problem: RH Tub/Shower Transfers Goal: LTG Patient will perform tub/shower transfers w/assist (OT) Description: LTG: Patient will perform tub/shower transfers with assist, with/without cues using equipment (OT) Flowsheets (Taken 02/11/2021 1640) LTG: Pt will perform tub/shower stall transfers with assistance level of: Supervision/Verbal cueing

## 2021-02-11 NOTE — Progress Notes (Signed)
Inpatient Rehabilitation  Patient information reviewed and entered into eRehab system by Reighlyn Elmes M. Armel Rabbani, M.A., CCC/SLP, PPS Coordinator.  Information including medical coding, functional ability and quality indicators will be reviewed and updated through discharge.    

## 2021-02-11 NOTE — Evaluation (Signed)
Speech Language Pathology Assessment and Plan  Patient Details  Name: Teresa [name] MRN: [id number] Date of Birth: 1950-12-10  SLP Diagnosis: Cognitive Impairments  Rehab Potential: Good ELOS: 12-14 days    Today's Date: 02/11/2021 SLP Individual Time: 0258-5277 SLP Individual Time Calculation (min): 42 min   Hospital Problem: Principal Problem:   Brain tumor [facility])  Past Medical History:  Past Medical History:  Diagnosis Date  . Arthritis   . Lung cancer ([place]) 2015   Past Surgical History:  Past Surgical History:  Procedure Laterality Date  . APPLICATION OF CRANIAL NAVIGATION N/A 02/06/2021   Procedure: APPLICATION OF CRANIAL NAVIGATION;  Surgeon: Newman Pies, MD;  Location: [place];  Service: Neurosurgery;  Laterality: N/A;  . BREAST LUMPECTOMY Left 2015  . BREAST SURGERY Left    tumor removal  . CRANIOTOMY N/A 02/06/2021   Procedure: Suboccipital Craniectomy for Tumor Resection with Brainlab Navigation;  Surgeon: Newman Pies, MD;  Location: [place];  Service: Neurosurgery;  Laterality: N/A;  . TUBAL LIGATION      Assessment / Plan / Recommendation   Clinical Impression   Teresa [name] is a 70 year old right-handed Hispanic female with history of breast cancer status post breast lumpectomy 2015 including previous spread of disease into her lungs as well as history of tobacco use.  She last received her 24 cycle of TDM-1 late August 2021 followed by Dr. Lavera Guise.  Per chart review she lives with her daughter.  Reportedly independent prior to admission.  Two-level home bed and bath upstairs.  Presented 02/06/2021 with persistent headache nausea and unsteady gait.  CT of the brain and MRI imaging demonstrated a left cerebellar tumor consistent with metastasis.  Patient underwent left suboccipital craniectomy for gross total resection of left cerebellar tumor using microdissection 02/06/2021 per Dr. Newman Pies.  Maintained on Decadron protocol.   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  Awaiting followed by oncology services.  Tolerating a regular diet.  Therapy evaluations completed due to patient's decreased functional mobility was admitted for a comprehensive rehab program. She is currently alert and oriented x3.  Pt presents with mild cognitive linguistic impairment characterized by decreased memory, decreased problem solving, and decreased attention. She was able to sustain attention for 60 min session however demonstrated difficulty with digit repetition subtest (4/8) and when dividing attention between SLP's verbal instructions and visual task. Pt self reports difficulty with focus and concentration. She completed basic problem solving task independently but needed additional cues with unfamiliar semi complex problem solving tasks. Pt demonstrated good recall of medical events and awareness of symptoms she noticed prior to hospitalization. She required mod A for recall of new information after a delay. She is on a regular diet and thin liquids and tolerating well with no overt s/sx of aspiration or penetration. Pt will benefit from skilled SLP intervention for cognition.    Skilled Therapeutic Interventions           Cognistat administered to assess cognition. Plan  Of care reviewed with patient and patient is in agreement.  SLP Assessment  Patient will need skilled Speech Lanaguage Pathology Services during CIR admission    Recommendations  Follow up Recommendations: Home Health SLP;Outpatient SLP Equipment Recommended: None recommended by SLP    SLP Frequency 3 to 5 out of 7 days   SLP Duration  SLP Intensity  SLP Treatment/Interventions 12-14 days  Minumum of 1-2 x/day, 30 to 90 minutes  Cognitive remediation/compensation    Pain  Pain Assessment Pain Scale: Faces Faces Pain Scale: No hurt  Prior Functioning Cognitive/Linguistic Baseline: Within functional limits Type  of Home: House  Lives With: Daughter Available Help at Discharge: Family  SLP Evaluation Cognition Overall Cognitive Status: Impaired/Different from baseline Arousal/Alertness: Awake/alert Orientation Level: Oriented X4 Memory: Impaired Memory Impairment: Storage deficit Awareness: Appears intact Problem Solving: Impaired Problem Solving Impairment: Verbal complex;Functional complex Executive Function: Organizing Reasoning: Impaired Reasoning Impairment: Verbal complex;Functional complex Safety/Judgment: Appears intact  Comprehension Auditory Comprehension Overall Auditory Comprehension: Appears within functional limits for tasks assessed Expression Expression Primary Mode of Expression: Verbal Verbal Expression Overall Verbal Expression: Appears within functional limits for tasks assessed Oral Motor Oral Motor/Sensory Function Overall Oral Motor/Sensory Function: Within functional limits  Care Tool Care Tool Cognition Expression of Ideas and Wants     Understanding Verbal and Non-Verbal Content     Memory/Recall Ability *first 3 days only           Short Term Goals: Week 1: SLP Short Term Goal 1 (Week 1): Pt will demonstrate alternating attention between functional tasks for 3-5 minutes with mod A verbal cues. SLP Short Term Goal 2 (Week 1): Pt will complete mildly complex problem solving tasks with mod A verbal cues SLP Short Term Goal 3 (Week 1): Pt will recall 2 events from previous therapy sessions with Mod A verbal cues. SLP Short Term Goal 4 (Week 1): Pt will demonstrate ability to manage medications with Mod A verbal cues.  Refer to Care Plan for Long Term Goals  Recommendations for other services: None   Discharge Criteria: Patient will be discharged from SLP if patient refuses treatment 3 consecutive times without medical reason, if treatment goals not met, if there is a change in medical status, if patient makes no progress towards goals or if patient  is discharged from hospital.  The above assessment, treatment plan, treatment alternatives and goals were discussed and mutually agreed upon: by patient  [place] 02/11/2021, 11:11 AM

## 2021-02-11 NOTE — Progress Notes (Signed)
Pt alert and aware in bed pt's daughter at bedside. The pt was spanish speaking but the daughter interpreted the conversation. The chaplain offered caring and supportive presence, prayers and blessings.

## 2021-02-11 NOTE — Evaluation (Signed)
Physical Therapy Assessment and Plan  Patient Details  Name: Teresa [name] MRN: [id number] Date of Birth: 1951/04/13  PT Diagnosis: Difficulty walking, Dizziness and giddiness and Hemiparesis non-dominant Rehab Potential: Good ELOS: 7-10 days   Today's Date: 02/11/2021 PT Individual Time:  9528-4132  PT Individual Time Calculation (min): 71 min    Hospital Problem: Principal Problem:   Brain tumor [facility])   Past Medical History:  Past Medical History:  Diagnosis Date  . Arthritis   . Lung cancer ([place]) 2015   Past Surgical History:  Past Surgical History:  Procedure Laterality Date  . APPLICATION OF CRANIAL NAVIGATION N/A 02/06/2021   Procedure: APPLICATION OF CRANIAL NAVIGATION;  Surgeon: Newman Pies, MD;  Location: [place];  Service: Neurosurgery;  Laterality: N/A;  . BREAST LUMPECTOMY Left 2015  . BREAST SURGERY Left    tumor removal  . CRANIOTOMY N/A 02/06/2021   Procedure: Suboccipital Craniectomy for Tumor Resection with Brainlab Navigation;  Surgeon: Newman Pies, MD;  Location: [place];  Service: Neurosurgery;  Laterality: N/A;  . TUBAL LIGATION      Assessment & Plan Clinical Impression: Patient is a 70 y.o. right-handed Hispanic female with history of breast cancer status post breast lumpectomy 2015 including previous spread of disease into her lungs as well as history of tobacco use.  She last received her 24 cycle of TDM-1 late August 2021 followed by Dr. Lavera Guise.  Per chart review she lives with her daughter.  Reportedly independent prior to admission.  Two-level home bed and bath upstairs.  Presented 02/06/2021 with persistent headache nausea and unsteady gait.  CT of the brain and MRI imaging demonstrated a left cerebellar tumor consistent with metastasis.  Patient underwent left suboccipital craniectomy for gross total resection of left cerebellar tumor using microdissection 02/06/2021 per Dr. Newman Pies.  Maintained on Decadron protocol.   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  Awaiting followed by oncology services.  Tolerating a regular diet.  Therapy evaluations completed due to patient's decreased functional mobility was admitted for a comprehensive rehab program. She is currently alert and oriented x3. Patient transferred to CIR on 02/10/2021 .   Patient currently requires min with mobility secondary to muscle weakness, decreased cardiorespiratoy endurance, impaired timing and sequencing, unbalanced muscle activation and decreased coordination, ,, decreased memory and delayed processing and decreased standing balance.  Prior to hospitalization, patient was modified independent  with mobility and lived with Daughter in a House home.  Home access is 15Stairs to enter.  Patient will benefit from skilled PT intervention to maximize safe functional mobility, minimize fall risk and decrease caregiver burden for planned discharge home with 24 hour assist.  Anticipate patient will benefit from follow up [facility] at discharge.  PT - End of Session Activity Tolerance: Tolerates 30+ min activity with multiple rests Endurance Deficit: Yes Endurance Deficit Description: 7-10 days PT Assessment Rehab Potential (ACUTE/IP ONLY): Good PT Barriers to Discharge: Decreased caregiver support;Home environment access/layout;Wound Care;Incontinence;Lack of/limited family support;Insurance for SNF coverage;Pending chemo/radiation;Nutrition means PT Barriers to Discharge Comments: standing balance/ coordination and overall activity tolerance PT Patient demonstrates impairments in the following area(s): Balance;Endurance;Motor;Pain;Safety;Sensory PT Transfers Functional Problem(s): Bed Mobility;Bed to Chair;Car;Furniture PT Locomotion Functional Problem(s): Ambulation;Stairs;Wheelchair Mobility PT Plan PT Intensity: Minimum of 1-2 x/day ,45 to 90 minutes PT Frequency: 5 out of 7 days PT Duration Estimated  Length of Stay: 7-10 days PT Treatment/Interventions: Ambulation/gait training;Balance/vestibular training;Cognitive remediation/compensation;Community reintegration;Discharge planning;Disease management/prevention;DME/adaptive equipment instruction;Functional mobility training;Neuromuscular re-education;Pain management;Patient/family education;Psychosocial support;Skin care/wound  management;Stair training;Therapeutic Activities;Therapeutic Exercise;UE/LE Strength taining/ROM;UE/LE Coordination activities;Visual/perceptual remediation/compensation PT Transfers Anticipated Outcome(s): supervision with LRAD PT Locomotion Anticipated Outcome(s): supervision with LRAD PT Recommendation Follow Up Recommendations: Home health PT Patient destination: Home Equipment Recommended: To be determined Equipment Details: TBD closer to d/c date   PT Evaluation Precautions/Restrictions Precautions Precautions: Fall Restrictions Weight Bearing Restrictions: No Other Position/Activity Restrictions: ROM to c/spine as tolerated General   Vital Signs Pain Pain Assessment Pain Scale: 0-10 Pain Score: 0-No pain Faces Pain Scale: No hurt Home Living/Prior Functioning Home Living Available Help at Discharge: Family Type of Home: House Home Access: Stairs to enter Technical brewer of Steps: 15 Entrance Stairs-Rails: Right Home Layout: Two level;Bed/bath upstairs Bathroom Shower/Tub: Chiropodist: Standard  Lives With: Daughter Prior Function Level of Independence: Independent with basic ADLs;Independent with homemaking with ambulation;Independent with gait;Independent with transfers  Able to Take Stairs?: Yes Vision/Perception  Perception Perception: Within Functional Limits Praxis Praxis: Intact  Cognition Overall Cognitive Status: Impaired/Different from baseline Arousal/Alertness: Awake/alert Orientation Level: Oriented X4 Attention: Sustained Sustained Attention:  Impaired Memory: Impaired Memory Impairment: Storage deficit Awareness: Impaired Problem Solving: Impaired Problem Solving Impairment: Verbal complex;Functional complex Executive Function: Organizing Reasoning: Impaired Reasoning Impairment: Verbal complex;Functional complex Safety/Judgment: Appears intact Sensation Sensation Light Touch: Appears Intact Proprioception: Appears Intact Stereognosis: Appears Intact Coordination Gross Motor Movements are Fluid and Coordinated: Yes Fine Motor Movements are Fluid and Coordinated: Yes Heel Shin Test: Able to perform slowly but correctly once she understood desired movement from interpreter. Motor  Motor Motor - Skilled Clinical Observations: LLE minimally weaker than RLE; unclear if premorbid   Trunk/Postural Assessment  Cervical Assessment Cervical Assessment: Within Functional Limits Thoracic Assessment Thoracic Assessment: Exceptions to [facility] (rounded shoulders) Lumbar Assessment Lumbar Assessment: Within Functional Limits Postural Control Postural Control: Within Functional Limits  Balance Balance Balance Assessed: Yes Standardized Balance Assessment Standardized Balance Assessment: Berg Balance Test Berg Balance Test Sit to Stand: Able to stand using hands after several tries Standing Unsupported: Able to stand safely 2 minutes Sitting with Back Unsupported but Feet Supported on Floor or Stool: Able to sit safely and securely 2 minutes Stand to Sit: Controls descent by using hands Transfers: Able to transfer with verbal cueing and /or supervision Standing Unsupported with Eyes Closed: Unable to keep eyes closed 3 seconds but stays steady Standing Ubsupported with Feet Together: Able to place feet together independently but unable to hold for 30 seconds From Standing, Reach Forward with Outstretched Arm: Can reach forward >5 cm safely (2") From Standing Position, Pick up Object from Floor: Unable to pick up shoe, but reaches  2-5 cm (1-2") from shoe and balances independently From Standing Position, Turn to Look Behind Over each Shoulder: Turn sideways only but maintains balance Turn 360 Degrees: Needs close supervision or verbal cueing Standing Unsupported, Alternately Place Feet on Step/Stool: Needs assistance to keep from falling or unable to try Standing Unsupported, One Foot in Front: Needs help to step but can hold 15 seconds Standing on One Leg: Unable to try or needs assist to prevent fall Total Score: 26 Static Sitting Balance Static Sitting - Balance Support: No upper extremity supported;Feet supported Static Sitting - Level of Assistance: 6: Modified independent (Device/Increase time) Dynamic Sitting Balance Dynamic Sitting - Balance Support: No upper extremity supported;Feet supported;During functional activity Dynamic Sitting - Level of Assistance: 5: Stand by assistance Dynamic Sitting - Balance Activities: Reaching for weighted objects;Reaching across midline;Reaching for objects;Forward lean/weight shifting;Lateral lean/weight shifting Static Standing Balance Static Standing - Balance  Support: No upper extremity supported;During functional activity Static Standing - Level of Assistance: 4: Min assist;5: Stand by assistance Dynamic Standing Balance Dynamic Standing - Balance Support: Bilateral upper extremity supported;During functional activity Dynamic Standing - Level of Assistance: 5: Stand by assistance Dynamic Standing - Balance Activities: Reaching for objects;Reaching for weighted objects;Reaching across midline Extremity Assessment  RLE Assessment General Strength Comments: Grossly 4-/ 5 LLE Assessment General Strength Comments: Grossly 4/ 5  Care Tool Care Tool Bed Mobility Roll left and right activity   Roll left and right assist level: Supervision/Verbal cueing    Sit to lying activity   Sit to lying assist level: Supervision/Verbal cueing    Lying to sitting edge of bed  activity   Lying to sitting edge of bed assist level: Supervision/Verbal cueing     Care Tool Transfers Sit to stand transfer   Sit to stand assist level: Contact Guard/Touching assist    Chair/bed transfer   Chair/bed transfer assist level: Contact Guard/Touching assist     Toilet transfer   Assist Level: Contact Guard/Touching assist    Car transfer   Car transfer assist level: Contact Guard/Touching assist      Care Tool Locomotion Ambulation   Assist level: Contact Guard/Touching assist Assistive device: Walker-rolling    Walk 10 feet activity   Assist level: Contact Guard/Touching assist Assistive device: Walker-rolling   Walk 50 feet with 2 turns activity   Assist level: Contact Guard/Touching assist Assistive device: Walker-rolling  Walk 150 feet activity Walk 150 feet activity did not occur: Safety/medical concerns      Walk 10 feet on uneven surfaces activity Walk 10 feet on uneven surfaces activity did not occur: Safety/medical concerns      Stairs   Assist level: Contact Guard/Touching assist Stairs assistive device: 1 hand rail Max number of stairs: 4  Walk up/down 1 step activity   Walk up/down 1 step (curb) assist level: Contact Guard/Touching assist Walk up/down 1 step or curb assistive device: 1 hand rail    Walk up/down 4 steps activity Walk up/down 4 steps assist level: Contact Guard/Touching assist Walk up/down 4 steps assistive device: 1 hand rail  Walk up/down 12 steps activity Walk up/down 12 steps activity did not occur: Safety/medical concerns   Walk up/down 12 steps assistive device: 1 hand rail  Pick up small objects from floor Pick up small object from the floor (from standing position) activity did not occur: Safety/medical concerns      Wheelchair Will patient use wheelchair at discharge?: No          Wheel 50 feet with 2 turns activity      Wheel 150 feet activity        Refer to Care Plan for Long Term Goals  SHORT TERM  GOAL WEEK 1 PT Short Term Goal 1 (Week 1): STG = LTG d/t short ELOS  Recommendations for other services: Therapeutic Recreation  Stress management and Outing/community reintegration  Skilled Therapeutic Intervention Mobility Bed Mobility Bed Mobility: Rolling Right;Supine to Sit;Sit to Supine Rolling Right: Supervision/verbal cueing Supine to Sit: Contact Guard/Touching assist Sit to Supine: Supervision/Verbal cueing Transfers Transfers: Sit to Stand;Stand to Sit;Stand Pivot Transfers Sit to Stand: Contact Guard/Touching assist Stand to Sit: Contact Guard/Touching assist Stand Pivot Transfers: Contact Guard/Touching assist Transfer (Assistive device): Rolling walker Locomotion  Gait Ambulation: Yes Gait Distance (Feet): 45 Feet Assistive device: Rolling walker Gait Gait: Yes Gait Pattern: Impaired Gait Pattern: Step-through pattern;Decreased hip/knee flexion - left Stairs / Additional Locomotion  Stairs: Yes Stairs Assistance: Minimal Assistance - Patient > 75% Stair Management Technique: One rail Right Number of Stairs: 4 Height of Stairs: 6 Wheelchair Mobility Wheelchair Mobility: No  PT Evaluation completed; see above for results. PT educated patient in roles of PT vs OT, PT POC, rehab potential, rehab goals, and discharge recommendations along with recommendation for follow-up rehabilitation services. Individual treatment initiated:  Patient supine in bed upon PT arrival. Patient alert and agreeable to PT session. No pain complaint during session. Minimal dizziness related upon initial standing which resolves per pt report while standing. Pt also relates that her eyes do not hurt and vision is fine but feels better when she can close her eyes while talking, may be more motor related than vision/ perception.   When asked, pt is able to relate that she does not have cervical ROM restrictions, but is to range as tolerated without increasing pain.   Therapeutic Activity: Bed  Mobility: Patient performed supine --> sit with CGA. Provided verbal cues for coordinated breathing and technique to reduce intracranial pressure. . Transfers: Patient performed STS and SPVT transfers throughout session very slowly with CGA/ min A using RW. Provided verbal cues for technique/ safety.  Gait Training:  Patient ambulated very slowly with upright posture using RW with CGA. She is able to cover 20' x1/ 26' x1 within room reaching destinations with ease. Reaches 62' using RW prior to requesting to sit d/t fatigue. Provided verbal cues for safety awareness.  Neuromuscular Re-ed: NMR facilitated during session with focus on standing balance. Pt guided in static and dynamic aspects of Berg balance and completes with score of 26/ 56. NMR performed for improvements in motor control and coordination, balance, sequencing, judgement, and self confidence/ efficacy in performing all aspects of mobility at highest level of independence.   Patient seated upright in w/c at end of session with ST in room to start evaluation session with interpreter and all needs within reach.  Pt provided with proper fit w/c and leg rests for improved transportation to/ from therapy bouts until activity tolerance improves.    Discharge Criteria: Patient will be discharged from PT if patient refuses treatment 3 consecutive times without medical reason, if treatment goals not met, if there is a change in medical status, if patient makes no progress towards goals or if patient is discharged from hospital.  The above assessment, treatment plan, treatment alternatives and goals were discussed and mutually agreed upon: by patient  Teresa [name] 02/11/2021, 2:05 PM

## 2021-02-12 MED ORDER — SENNA 8.6 MG PO TABS
2.0000 | ORAL_TABLET | Freq: Every day | ORAL | Status: DC
  Administered 2021-02-12 – 2021-02-16 (×5): 17.2 mg via ORAL
  Filled 2021-02-12 (×5): qty 2

## 2021-02-12 MED ORDER — HYDROCODONE-ACETAMINOPHEN 5-325 MG PO TABS
1.0000 | ORAL_TABLET | Freq: Four times a day (QID) | ORAL | Status: DC | PRN
Start: 2021-02-12 — End: 2021-02-13

## 2021-02-12 NOTE — Progress Notes (Signed)
PROGRESS NOTE   Subjective/Complaints: No complaints this morning Discussed benefits of minimizing added sugar in diet and answered all patients questions bradycardic  ROS: denies pain  Objective:   No results found. No results for input(s): WBC, HGB, HCT, PLT in the last 72 hours. No results for input(s): NA, K, CL, CO2, GLUCOSE, BUN, CREATININE, CALCIUM in the last 72 hours.  Intake/Output Summary (Last 24 hours) at 02/12/2021 1304 Last data filed at 02/12/2021 0800 Gross per 24 hour  Intake 120 ml  Output --  Net 120 ml        Physical Exam: Vital Signs Blood pressure 109/67, pulse (!) 55, temperature 98.2 F (36.8 C), temperature source Oral, resp. rate 18, height 5\' 1"  (1.549 m), weight 68 kg, SpO2 96 %. Gen: no distress, normal appearing HEENT: oral mucosa pink and moist, NCAT Cardio: Bradycardic Chest: normal effort, normal rate of breathing Abd: soft, non-distended Ext: no edema Psych: pleasant, normal affect Skin: intact Neurological:     Comments: Patient is alert and oriented x3.  Non-English-speaking female.  No acute distress.  Follows simple commands.  Daughter was at bedside.  Patient answers questions appropriately through her daughter translating. 5/5 strength throughout. Impaired memory.   Assessment/Plan: 1. Functional deficits which require 3+ hours per day of interdisciplinary therapy in a comprehensive inpatient rehab setting.  Physiatrist is providing close team supervision and 24 hour management of active medical problems listed below.  Physiatrist and rehab team continue to assess barriers to discharge/monitor patient progress toward functional and medical goals  Care Tool:  Bathing    Body parts bathed by patient: Right arm,Left arm,Chest,Abdomen,Front perineal area,Buttocks,Right upper leg,Left upper leg,Right lower leg,Left lower leg,Face         Bathing assist Assist Level:  Contact Guard/Touching assist     Upper Body Dressing/Undressing Upper body dressing   What is the patient wearing?: Hospital gown only    Upper body assist Assist Level: Minimal Assistance - Patient > 75%    Lower Body Dressing/Undressing Lower body dressing      What is the patient wearing?: Hospital gown only     Lower body assist Assist for lower body dressing: Minimal Assistance - Patient > 75%     Toileting Toileting    Toileting assist Assist for toileting: Contact Guard/Touching assist     Transfers Chair/bed transfer  Transfers assist     Chair/bed transfer assist level: Contact Guard/Touching assist     Locomotion Ambulation   Ambulation assist      Assist level: Contact Guard/Touching assist Assistive device: Walker-rolling     Walk 10 feet activity   Assist     Assist level: Contact Guard/Touching assist Assistive device: Walker-rolling   Walk 50 feet activity   Assist    Assist level: Contact Guard/Touching assist Assistive device: Walker-rolling    Walk 150 feet activity   Assist Walk 150 feet activity did not occur: Safety/medical concerns         Walk 10 feet on uneven surface  activity   Assist Walk 10 feet on uneven surfaces activity did not occur: Safety/medical concerns         Wheelchair  Assist Will patient use wheelchair at discharge?: No             Wheelchair 50 feet with 2 turns activity    Assist            Wheelchair 150 feet activity     Assist          Blood pressure 109/67, pulse (!) 55, temperature 98.2 F (36.8 C), temperature source Oral, resp. rate 18, height 5\' 1"  (1.549 m), weight 68 kg, SpO2 96 %.  Medical Problem List and Plan: 1.  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  Status post left suboccipital craniectomy gross total resection on 02/06/2021.  Decadron protocol taper              -patient may shower but incision must be covered             -ELOS/Goals: modI 7-10 days            continue CIR 2.  Impaired mobility -DVT/anticoagulation: Continue SCDs             -antiplatelet therapy: N/A 3. Postoperative pain: Decrease Norco to 1 tab q6H prn. D/c IV morphine.  4. Mood: Provide emotional support             -antipsychotic agents: N/A 5. Neuropsych: This patient is capable of making decisions on her own behalf. 6. Skin/Wound Care: Routine skin checks 7. Fluids/Electrolytes/Nutrition: Routine in and outs with follow-up chemistries.  8.  Constipation.  Colace 100 mg twice daily, MiraLAX as needed. Add senna 1 tabs HS. Discussed benefits of high fiber foods.  9.  Tobacco use.  Counseling 10. Overweight BMI 28.72-->28.34: provided significant education regarding healthy diet.  11. Impaired memory: continue SLP.  12. S/p left subocciptal craniectomy: on decadron 4mg  q8H. Please f/u with NSGY on Monday regarding taper     LOS: 2 days A FACE TO FACE EVALUATION WAS PERFORMED  [place] Gipe P Alasia Enge 02/12/2021, 1:04 PM

## 2021-02-12 NOTE — Plan of Care (Signed)
  Problem: RH Balance Goal: LTG Patient will maintain dynamic sitting balance (PT) Description: LTG:  Patient will maintain dynamic sitting balance with assistance during mobility activities (PT) Flowsheets (Taken 02/11/2021 1911) LTG: Pt will maintain dynamic sitting balance during mobility activities with:: Independent Goal: LTG Patient will maintain dynamic standing balance (PT) Description: LTG:  Patient will maintain dynamic standing balance with assistance during mobility activities (PT) Flowsheets (Taken 02/11/2021 1911) LTG: Pt will maintain dynamic standing balance during mobility activities with:: Supervision/Verbal cueing   Problem: Sit to Stand Goal: LTG:  Patient will perform sit to stand with assistance level (PT) Description: LTG:  Patient will perform sit to stand with assistance level (PT) Flowsheets (Taken 02/11/2021 1911) LTG: PT will perform sit to stand in preparation for functional mobility with assistance level: Independent with assistive device   Problem: RH Bed Mobility Goal: LTG Patient will perform bed mobility with assist (PT) Description: LTG: Patient will perform bed mobility with assistance, with/without cues (PT). Flowsheets (Taken 02/11/2021 1911) LTG: Pt will perform bed mobility with assistance level of: Independent with assistive device    Problem: RH Bed to Chair Transfers Goal: LTG Patient will perform bed/chair transfers w/assist (PT) Description: LTG: Patient will perform bed to chair transfers with assistance (PT). Flowsheets (Taken 02/11/2021 1911) LTG: Pt will perform Bed to Chair Transfers with assistance level: Supervision/Verbal cueing   Problem: RH Car Transfers Goal: LTG Patient will perform car transfers with assist (PT) Description: LTG: Patient will perform car transfers with assistance (PT). Flowsheets (Taken 02/11/2021 1911) LTG: Pt will perform car transfers with assist:: Supervision/Verbal cueing   Problem: RH Furniture  Transfers Goal: LTG Patient will perform furniture transfers w/assist (OT/PT) Description: LTG: Patient will perform furniture transfers  with assistance (OT/PT). Flowsheets (Taken 02/11/2021 1911) LTG: Pt will perform furniture transfers with assist:: Supervision/Verbal cueing   Problem: RH Ambulation Goal: LTG Patient will ambulate in controlled environment (PT) Description: LTG: Patient will ambulate in a controlled environment, # of feet with assistance (PT). Flowsheets (Taken 02/11/2021 1911) LTG: Pt will ambulate in controlled environ  assist needed:: Supervision/Verbal cueing LTG: Ambulation distance in controlled environment: 100 feet with LRAD Goal: LTG Patient will ambulate in home environment (PT) Description: LTG: Patient will ambulate in home environment, # of feet with assistance (PT). Flowsheets (Taken 02/11/2021 1911) LTG: Pt will ambulate in home environ  assist needed:: Supervision/Verbal cueing LTG: Ambulation distance in home environment: at least 50 feet with LRAD   Problem: RH Stairs Goal: LTG Patient will ambulate up and down stairs w/assist (PT) Description: LTG: Patient will ambulate up and down # of stairs with assistance (PT) Flowsheets (Taken 02/11/2021 1911) LTG: Pt will ambulate up/down stairs assist needed:: Supervision/Verbal cueing LTG: Pt will  ambulate up and down number of stairs: at least 2 to enter/ exit home and full flight to reach 2nd floor of home with HR setup as per home environment

## 2021-02-13 LAB — COMPREHENSIVE METABOLIC PANEL
ALT: 65 U/L — ABNORMAL HIGH (ref 0–44)
AST: 32 U/L (ref 15–41)
Albumin: 3.2 g/dL — ABNORMAL LOW (ref 3.5–5.0)
Alkaline Phosphatase: 83 U/L (ref 38–126)
Anion gap: 6 (ref 5–15)
BUN: 18 mg/dL (ref 8–23)
CO2: 27 mmol/L (ref 22–32)
Calcium: 9.2 mg/dL (ref 8.9–10.3)
Chloride: 102 mmol/L (ref 98–111)
Creatinine, Ser: 0.76 mg/dL (ref 0.44–1.00)
GFR, Estimated: >60 mL/min (ref >60)
Glucose, Bld: 128 mg/dL — ABNORMAL HIGH (ref 70–99)
Potassium: 3.8 mmol/L (ref 3.5–5.1)
Sodium: 135 mmol/L (ref 135–145)
Total Bilirubin: 0.3 mg/dL (ref 0.3–1.2)
Total Protein: 7.2 g/dL (ref 6.5–8.1)

## 2021-02-13 LAB — CBC WITH DIFFERENTIAL/PLATELET
Abs Immature Granulocytes: 0.25 10*3/uL — ABNORMAL HIGH (ref 0.00–0.07)
Basophils Absolute: 0 10*3/uL (ref 0.0–0.1)
Basophils Relative: 0 %
Eosinophils Absolute: 0 10*3/uL (ref 0.0–0.5)
Eosinophils Relative: 0 %
HCT: 38.9 % (ref 36.0–46.0)
Hemoglobin: 12.9 g/dL (ref 12.0–15.0)
Immature Granulocytes: 3 %
Lymphocytes Relative: 12 %
Lymphs Abs: 1 10*3/uL (ref 0.7–4.0)
MCH: 30.8 pg (ref 26.0–34.0)
MCHC: 33.2 g/dL (ref 30.0–36.0)
MCV: 92.8 fL (ref 80.0–100.0)
Monocytes Absolute: 0.4 10*3/uL (ref 0.1–1.0)
Monocytes Relative: 5 %
Neutro Abs: 7.1 10*3/uL (ref 1.7–7.7)
Neutrophils Relative %: 80 %
Platelets: 206 10*3/uL (ref 150–400)
RBC: 4.19 MIL/uL (ref 3.87–5.11)
RDW: 13.6 % (ref 11.5–15.5)
WBC: 8.7 10*3/uL (ref 4.0–10.5)
nRBC: 0 % (ref 0.0–0.2)

## 2021-02-13 MED ORDER — HYDROCODONE-ACETAMINOPHEN 5-325 MG PO TABS
1.0000 | ORAL_TABLET | Freq: Three times a day (TID) | ORAL | Status: DC | PRN
  Administered 2021-02-15: 1 via ORAL
  Filled 2021-02-13: qty 1

## 2021-02-13 MED ORDER — MECLIZINE HCL 25 MG PO TABS: 12.5000 mg | ORAL_TABLET | Freq: Two times a day (BID) | ORAL | Status: DC | PRN

## 2021-02-13 NOTE — Progress Notes (Signed)
Physical Therapy Session Note  Patient Details  Name: Teresa [name] MRN: [id number] Date of Birth: 03-05-1951  Today's Date: 02/13/2021 PT Individual Time: 1002-1102 PT Individual Time Calculation (min): 60 min   Short Term Goals: Week 1:  PT Short Term Goal 1 (Week 1): STG = LTG d/t short ELOS  Skilled Therapeutic Interventions/Progress Updates:    Patient in w/c just s/p OT and seen throughout session with in person Spanish interpreter, Graciella.  Patient sit to stand with CGA and ambulated with RW very slowly with cues for using visual target to help with dizziness symptoms x about 17' with w/c follow.  Patient pushed in w/c to ADL apartment.  Performed short distance ambulation over carpet and tile surfaces with RW and CGA, furniture transfer on low couch with cues and CGA, then sit<>supine to regular bed with S.  Reaching to retrieve bowl, spoon and cereal box from cabinet, drawer and pantry in kitchen using RW and CGA to min A for reaching.  Patient fatigued so returned to w/c for seated rest.  While resting education on need for rest breaks throughout the day and planning of activities for energy conservation due to limited activity tolerance.  Patient verbalized about the issue with her chemotherapy which she has been on voer 6-7 years, but adjusting dosage and frequency of infusions based on her symptoms.  Also educated that depending on what her chemo regimen is going forward it may also likely affect her energy.  Patient assisted in w/c to therapy gym.  Negotiated 4 steps x 2 with R rail, sideways technique with step to sequence after demonstration.  Patient assisted to ortho gym for standing balance activity at BITS tapping targets over screen till making her too dizzy, maybe close to 2 minutes then needing seated rest.  Patient ambulated x 50' with RW and CGA to min A with w/c following toward room.  Patient assisted to room in w/c and then to bathroom transferring and managing  clothing with CGA.  Sit to supine on bed with S after pt reports HOB to be elevated per her MD.  Left with needs in reach and bed alarm active.   Therapy Documentation Precautions:  Precautions Precautions: Fall Restrictions Weight Bearing Restrictions: No Other Position/Activity Restrictions: ROM to c/spine as tolerated Pain: Pain Assessment Pain Score: 0-No pain     Therapy/Group: Individual Therapy  Reginia Naas  Magda Kiel, PT 02/13/2021, 12:37 PM

## 2021-02-13 NOTE — Progress Notes (Signed)
[facility] Individual Statement of Services  Patient Name:  Teresa [name]  Date:  02/13/2021  Welcome to the [facility].  Our goal is to provide you with an individualized program based on your diagnosis and situation, designed to meet your specific needs.  With this comprehensive rehabilitation program, you will be expected to participate in at least 3 hours of rehabilitation therapies Monday-Friday, with modified therapy programming on the weekends.  Your rehabilitation program will include the following services:  Physical Therapy (PT), Occupational Therapy (OT), Speech Therapy (ST), 24 hour per day rehabilitation nursing, Neuropsychology, Care Coordinator, Rehabilitation Medicine, Nutrition Services and Pharmacy Services  Weekly team conferences will be held on Wednesday to discuss your progress.  Your Inpatient Rehabilitation Care Coordinator will talk with you frequently to get your input and to update you on team discussions.  Team conferences with you and your family in attendance may also be held.  Expected length of stay: 7-10 days  Overall anticipated outcome: Supervision with cues  Depending on your progress and recovery, your program may change. Your Inpatient Rehabilitation Care Coordinator will coordinate services and will keep you informed of any changes. Your Inpatient Rehabilitation Care Coordinator's name and contact numbers are listed  below.  The following services may also be recommended but are not provided by the [facility] will be made to provide these services after discharge if needed.  Arrangements include referral to agencies that provide these services.  Your insurance has been verified to be:  None Your primary doctor is:  Delight Stare  Pertinent information will be shared with  your doctor and your insurance company.  Inpatient Rehabilitation Care Coordinator:  Ovidio Kin, [place] or Emilia Beck  Information discussed with and copy given to patient by: Elease Hashimoto, 02/13/2021, 9:20 AM

## 2021-02-13 NOTE — IPOC Note (Signed)
Overall Plan of Care [facility]) Patient Details Name: Teresa [name] MRN: [id number] DOB: 1951-08-02  Admitting Diagnosis: Brain tumor [facility])  Hospital Problems: Principal Problem:   Brain tumor [facility])     Functional Problem List: Nursing Bladder,Pain,Bowel,Perception,Safety,Endurance,Skin Integrity  PT Balance,Endurance,Motor,Pain,Safety,Sensory  OT Balance,Cognition,Endurance,Motor,Perception,Safety,Vision  SLP Cognition,Linguistic  TR         Basic ADL's: OT Grooming,Bathing,Dressing,Toileting     Advanced  ADL's: OT       Transfers: PT Bed Mobility,Bed to Chair,Car,Furniture  OT Toilet,Tub/Shower     Locomotion: PT Ambulation,Stairs,Wheelchair Mobility     Additional Impairments: OT None  SLP Social Cognition   Problem Solving,Memory,Attention  TR      Anticipated Outcomes Item Anticipated Outcome  Self Feeding no goal  Swallowing      Basic self-care  Supervision  Toileting  Supervision   Bathroom Transfers Supervision  Bowel/Bladder  to be continent x 2  Transfers  supervision with LRAD  Locomotion  supervision with LRAD  Communication     Cognition  Supervision A  Pain  less than 2  Safety/Judgment  to remain fall free while in rehab   Therapy Plan: PT Intensity: Minimum of 1-2 x/day ,45 to 90 minutes PT Frequency: 5 out of 7 days PT Duration Estimated Length of Stay: 7-10 days OT Intensity: Minimum of 1-2 x/day, 45 to 90 minutes OT Frequency: 5 out of 7 days OT Duration/Estimated Length of Stay: 7-10 days SLP Intensity: Minumum of 1-2 x/day, 30 to 90 minutes SLP Frequency: 3 to 5 out of 7 days SLP Duration/Estimated Length of Stay: 12-14 days   Due to the current state of emergency, patients may not be receiving their 3-hours of Medicare-mandated therapy.   Team Interventions: Nursing Interventions Patient/Family Education,Disease Management/Prevention,Skin Care/Wound Management,Discharge Planning,Bladder Management,Pain  Management,Cognitive Remediation/Compensation,Psychosocial Support,Bowel Management,Medication Management  PT interventions Ambulation/gait training,Balance/vestibular training,Cognitive remediation/compensation,Community reintegration,Discharge planning,Disease management/prevention,DME/adaptive equipment instruction,Functional mobility training,Neuromuscular re-education,Pain management,Patient/family education,Psychosocial support,Skin care/wound management,Stair training,Therapeutic Activities,Therapeutic Exercise,UE/LE Strength taining/ROM,UE/LE Coordination activities,Visual/perceptual remediation/compensation  OT Interventions Balance/vestibular training,Discharge planning,Functional electrical stimulation,Pain management,Self Care/advanced ADL retraining,Therapeutic Activities,UE/LE Coordination activities,Visual/perceptual remediation/compensation,Therapeutic Exercise,Skin care/wound managment,Patient/family education,Functional mobility training,Disease mangement/prevention,Cognitive remediation/compensation,Community reintegration,DME/adaptive equipment instruction,Psychosocial support,Neuromuscular re-education,UE/LE Strength taining/ROM  SLP Interventions Cognitive remediation/compensation  TR Interventions    SW/CM Interventions Discharge Planning,Psychosocial Support,Patient/Family Education   Barriers to Discharge MD  Medical stability  Nursing      PT Decreased caregiver support,Home environment access/layout,Wound Care,Incontinence,Lack of/limited family support,Insurance for SNF coverage,Pending chemo/radiation,Nutrition means standing balance/ coordination and overall activity tolerance  OT      SLP      SW       Team Discharge Planning: Destination: PT-Home ,OT- Home , SLP-  Projected Follow-up: PT-Home health PT, OT-  Home health OT, SLP-Home Health SLP,Outpatient SLP Projected Equipment Needs: PT-To be determined, OT- To be determined, SLP-None recommended by  SLP Equipment Details: PT-TBD closer to d/c date, OT-  Patient/family involved in discharge planning: PT- Patient,  OT-Patient,Family member/caregiver, SLP-Patient  MD ELOS: 7-10 days Medical Rehab Prognosis:  Excellent Assessment: Mrs. 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 Status post left suboccipital craniectomy gross total resection on 02/06/2021. She is currently on decadron protocol taper- vitals and labs are being monitored regularly.    See Team Conference Notes for weekly updates to the plan of care

## 2021-02-13 NOTE — Progress Notes (Signed)
PROGRESS NOTE   Subjective/Complaints: Did great with therapy today Limited by dizziness- will add prn meclizine for her Her friend is visiting and they sell health products together   ROS: denies pain, +dizziness  Objective:   No results found. Recent Labs    02/13/21 0457  WBC 8.7  HGB 12.9  HCT 38.9  PLT 206   Recent Labs    02/13/21 0457  NA 135  K 3.8  CL 102  CO2 27  GLUCOSE 128*  BUN 18  CREATININE 0.76  CALCIUM 9.2    Intake/Output Summary (Last 24 hours) at 02/13/2021 1720 Last data filed at 02/13/2021 1342 Gross per 24 hour  Intake 720 ml  Output --  Net 720 ml        Physical Exam: Vital Signs Blood pressure 115/73, pulse 61, temperature (!) 97.5 F (36.4 C), resp. rate 17, height 5\' 1"  (1.549 m), weight 68 kg, SpO2 96 %. Gen: no distress, normal appearing HEENT: oral mucosa pink and moist, NCAT Cardio: Reg rate Chest: normal effort, normal rate of breathing Abd: soft, non-distended Ext: no edema Psych: pleasant, normal affect Skin: intact Neurological:     Comments: Patient is alert and oriented x3.  Non-English-speaking female.  No acute distress.  Follows simple commands.  Daughter was at bedside.  Patient answers questions appropriately through her daughter translating. 5/5 strength throughout. Impaired memory.   Assessment/Plan: 1. Functional deficits which require 3+ hours per day of interdisciplinary therapy in a comprehensive inpatient rehab setting.  Physiatrist is providing close team supervision and 24 hour management of active medical problems listed below.  Physiatrist and rehab team continue to assess barriers to discharge/monitor patient progress toward functional and medical goals  Care Tool:  Bathing    Body parts bathed by patient: Right arm,Left arm,Chest,Abdomen,Front perineal area,Buttocks,Right upper leg,Left upper leg,Right lower leg,Left lower leg,Face          Bathing assist Assist Level: Supervision/Verbal cueing     Upper Body Dressing/Undressing Upper body dressing   What is the patient wearing?: Pull over shirt    Upper body assist Assist Level: Minimal Assistance - Patient > 75%    Lower Body Dressing/Undressing Lower body dressing      What is the patient wearing?: Underwear/pull up,Pants     Lower body assist Assist for lower body dressing: Minimal Assistance - Patient > 75%     Toileting Toileting    Toileting assist Assist for toileting: Contact Guard/Touching assist     Transfers Chair/bed transfer  Transfers assist     Chair/bed transfer assist level: Contact Guard/Touching assist     Locomotion Ambulation   Ambulation assist      Assist level: Contact Guard/Touching assist Assistive device: Walker-rolling Max distance: 50'   Walk 10 feet activity   Assist     Assist level: Contact Guard/Touching assist Assistive device: Walker-rolling   Walk 50 feet activity   Assist    Assist level: Contact Guard/Touching assist Assistive device: Walker-rolling    Walk 150 feet activity   Assist Walk 150 feet activity did not occur: Safety/medical concerns         Walk 10 feet on uneven  surface  activity   Assist Walk 10 feet on uneven surfaces activity did not occur: Safety/medical concerns         Wheelchair     Assist Will patient use wheelchair at discharge?: No             Wheelchair 50 feet with 2 turns activity    Assist            Wheelchair 150 feet activity     Assist          Blood pressure 115/73, pulse 61, temperature (!) 97.5 F (36.4 C), resp. rate 17, height 5\' 1"  (1.549 m), weight 68 kg, SpO2 96 %.  Medical Problem List and Plan: 1.  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  Status post left suboccipital craniectomy gross total resection on 02/06/2021.   Decadron protocol taper             -patient may shower but incision must be covered             -ELOS/Goals: modI 7-10 days            -Continue CIR 2.  Impaired mobility -DVT/anticoagulation: Continue SCDs             -antiplatelet therapy: N/A 3. Postoperative pain: Decrease Norco to 1 tab q8H prn. D/c IV morphine.  4. Mood: Provide emotional support             -antipsychotic agents: N/A 5. Neuropsych: This patient is capable of making decisions on her own behalf. 6. Skin/Wound Care: Routine skin checks 7. Fluids/Electrolytes/Nutrition: Routine in and outs with follow-up chemistries.  8.  Constipation.  Colace 100 mg twice daily, MiraLAX as needed. Add senna 1 tabs HS. Discussed benefits of high fiber foods.  9.  Tobacco use.  Counseling 10. Overweight BMI 28.72-->28.34: provided significant education regarding healthy diet.  11. Impaired memory: continue SLP.  12. S/p left subocciptal craniectomy: on decadron 4mg  q8H. F/u with NSGY regarding taper 13. Dizziness: meclizine 12.5mg  BID PRN added     LOS: 3 days A FACE TO FACE EVALUATION WAS PERFORMED  Martha Clan P Rickesha Veracruz 02/13/2021, 5:20 PM

## 2021-02-13 NOTE — Progress Notes (Signed)
Occupational Therapy Session Note  Patient Details  Name: Teresa [name] MRN: [id number] Date of Birth: 1951/07/20  Today's Date: 02/13/2021 OT Individual Time: 0857-1000   &   1300-1400 OT Individual Time Calculation (min): 63 min   &  60 min   Short Term Goals: Week 1:  OT Short Term Goal 1 (Week 1): STGs = LTGs d/t ELOS at Supervision level  Skilled Therapeutic Interventions/Progress Updates:    AM session:   Patient in bed, alert and ready for therapy session.  She denies pain.  Interpreter present for majority of session.  She is able to move from supine to sitting edge of bed with CS.  Sit to stand and ambulation with RW to/from bed, toilet, shower bench, w/c with CG/CS.  She competes toileting (x2) with CS.  Shower completed seated on shower bench with CS (iv site and incision covered).  Dressing completed seated on w/c with min A for LB dressing, min A OH shirt, set up for socks.  Oral care CS in stance.  Hair care with set up seated.  She remained seated in w/c at close of session awaiting PT, interpreter stayed for next session.     PM session:   Patient in bed, finishing lunch, interpreter present for session.  She denies pain and is ready for therapy session.  Supine to sitting edge of bed with CS.  Sit to stand and short distance ambulation with RW to/from bed and w/c CS.   Reviewed and practiced tub bench and shower chair in tub/shower - she prefers the shower chair (square - rubbermaid).  Reviewed fit of DME - plans to have daughter measure shower/clearance and toilet height.  Reviewed energy conservation.  Reviewed and practiced basic reach and transport of items in kitchen environment - she was able to reach into high/low cabinets and refrigerator with CS - good carryover of hand placement and strategies.  Completed light UB AROM with good tolerance.  Ambulation with RW to/from toilet and bed with CS.  toileting CS.  She returned to bed at close of session, sit to supine CS.   Bed alarm set and callbell/tray table in reach.  She notes fatigue at close of session.      Therapy Documentation Precautions:  Precautions Precautions: Fall Restrictions Weight Bearing Restrictions: No Other Position/Activity Restrictions: ROM to c/spine as tolerated   Therapy/Group: Individual Therapy  Carlos Levering 02/13/2021, 7:41 AM

## 2021-02-13 NOTE — Progress Notes (Signed)
Inpatient Rehabilitation Care Coordinator Assessment and Plan Patient Details  Name: Teresa [name] MRN: [id number] Date of Birth: 06/12/1951  Today's Date: 02/13/2021  Hospital Problems: Principal Problem:   Brain tumor [facility])  Past Medical History:  Past Medical History:  Diagnosis Date  . Arthritis   . Lung cancer ([place]) 2015   Past Surgical History:  Past Surgical History:  Procedure Laterality Date  . APPLICATION OF CRANIAL NAVIGATION N/A 02/06/2021   Procedure: APPLICATION OF CRANIAL NAVIGATION;  Surgeon: Newman Pies, MD;  Location: [place];  Service: Neurosurgery;  Laterality: N/A;  . BREAST LUMPECTOMY Left 2015  . BREAST SURGERY Left    tumor removal  . CRANIOTOMY N/A 02/06/2021   Procedure: Suboccipital Craniectomy for Tumor Resection with Brainlab Navigation;  Surgeon: Newman Pies, MD;  Location: [place];  Service: Neurosurgery;  Laterality: N/A;  . TUBAL LIGATION     Social History:  reports that she has quit smoking. Her smoking use included cigarettes. She has never used smokeless tobacco. She reports previous alcohol use. She reports that she does not use drugs.  Family / Support Systems Marital Status: Divorced Patient Roles: Parent Children: Maria-daughter [phone] Other Supports: Extended family Anticipated Caregiver: Verdis Frederickson Ability/Limitations of Caregiver: Taking leave from work to provide care Caregiver Availability: 24/7 Family Dynamics: Close knit with family who will make sure she has what she needs. Pt is doing very well and making good progress here on rehab  Social History Preferred language: Spanish Religion:  Cultural Background: Spanish speaking needs interpreter while here Education: Some schooling Read: Yes (spanish) Write: Yes (spanish) Employment Status: Retired Public relations account executive Issues: No issues Guardian/Conservator: None-according to MD pt is capable of making her own decisions while here. Will include  daughter due to language barrier if any decisions need to be made while here   Abuse/Neglect Abuse/Neglect Assessment Can Be Completed: Yes Physical Abuse: Denies Verbal Abuse: Denies Sexual Abuse: Denies Exploitation of patient/patient's resources: Denies Self-Neglect: Denies  Emotional Status Pt's affect, behavior and adjustment status: Pt has always been independent even with her health issues. She prides herself on helping others and taking care of herself. Her daughter is a strong support of pt Recent Psychosocial Issues: other health issues-breast cancer and chemo Psychiatric History: No history pt seems to be coping appropriately and focusing on her recovery and getting stronger in case more treatment is needed. Substance Abuse History: Tobacco aware of the risks and aware of resources available to quit.  Patient / Family Perceptions, Expectations & Goals Pt/Family understanding of illness & functional limitations: Pt and daughter can explain her tumor and the surgery needed to get it out. Both hopes the MD got most of it, but aware it is cancer and she may need more treatment once healed from her surgery. Premorbid pt/family roles/activities: Mom, grandmother, retiree, friend Anticipated changes in roles/activities/participation: resume Pt/family expectations/goals: Pt states: " I want to get stronger and better."  Daughter states: " I hope she can get well so if more treatment is needed will be able to handle it."  [employer]: Other (Comment) [facility]) Premorbid Home Care/DME Agencies: None Transportation available at discharge: Daughter Resource referrals recommended: Neuropsychology  Discharge Planning Living Arrangements: Children Support Systems: Children,Other relatives,Friends/neighbors Type of Residence: Private residence Insurance Resources: Teacher, adult education Resources: Family Support Financial Screen Referred: Previously  completed Living Expenses: Lives with family Money Management: Family Does the patient have any problems obtaining your medications?: Yes (Describe) (uninsured) Home Management: Pt and daughter  Patient/Family Preliminary Plans: Return home with daughter who is taking a leave from work to provide care to [facility]. Pt is doing quite well and ambulating with a rolling walker-CGA. Made daughter aware of ELOS 7-10 days and team conference on Wed to actually give her a target discharge date. Daughter is staying home today and will be here tomorrow. Care Coordinator Barriers to Discharge: Insurance for SNF coverage Care Coordinator Anticipated Follow Up Needs: HH/OP  Clinical Impression Pleasant female who is working hard to regain her independence and to get stronger for possibly more treatment for her cancer. Her daughter is very involved and will assist her at discharge. Will update her once team conference Wed.  Elease Hashimoto 02/13/2021, 11:18 AM

## 2021-02-14 ENCOUNTER — Other Ambulatory Visit: Payer: Self-pay

## 2021-02-14 DIAGNOSIS — G8918 Other acute postprocedural pain: Secondary | ICD-10-CM

## 2021-02-14 DIAGNOSIS — K5901 Slow transit constipation: Secondary | ICD-10-CM

## 2021-02-14 DIAGNOSIS — R7401 Elevation of levels of liver transaminase levels: Secondary | ICD-10-CM

## 2021-02-14 DIAGNOSIS — R42 Dizziness and giddiness: Secondary | ICD-10-CM

## 2021-02-14 NOTE — Progress Notes (Signed)
PROGRESS NOTE   Subjective/Complaints: Patient seen sitting up in bed this morning.  She states she slept well overnight.  She asks me to help feed her flowers.  ROS: Denies CP, SOB, N/V/D  Objective:   No results found. Recent Labs    02/13/21 0457  WBC 8.7  HGB 12.9  HCT 38.9  PLT 206   Recent Labs    02/13/21 0457  NA 135  K 3.8  CL 102  CO2 27  GLUCOSE 128*  BUN 18  CREATININE 0.76  CALCIUM 9.2    Intake/Output Summary (Last 24 hours) at 02/14/2021 0824 Last data filed at 02/14/2021 0806 Gross per 24 hour  Intake 1200 ml  Output --  Net 1200 ml        Physical Exam: Vital Signs Blood pressure 119/86, pulse (!) 55, temperature 97.7 F (36.5 C), temperature source Oral, resp. rate 20, height 5\' 1"  (1.549 m), weight 68 kg, SpO2 96 %. Constitutional: No distress . Vital signs reviewed. HENT: Normocephalic.  Atraumatic. Eyes: EOMI. No discharge. Cardiovascular: No JVD.  RRR. Respiratory: Normal effort.  No stridor.  Bilateral clear to auscultation. GI: Non-distended.  BS +. Skin: Warm and dry.  Intact. Psych: Normal mood.  Normal behavior. Musc: No edema in extremities.  No tenderness in extremities. Neuro: Alert Follows simple commands.   Motor: 5/5 throughout  Assessment/Plan: 1. Functional deficits which require 3+ hours per day of interdisciplinary therapy in a comprehensive inpatient rehab setting.  Physiatrist is providing close team supervision and 24 hour management of active medical problems listed below.  Physiatrist and rehab team continue to assess barriers to discharge/monitor patient progress toward functional and medical goals  Care Tool:  Bathing    Body parts bathed by patient: Right arm,Left arm,Chest,Abdomen,Front perineal area,Buttocks,Right upper leg,Left upper leg,Right lower leg,Left lower leg,Face         Bathing assist Assist Level: Supervision/Verbal cueing      Upper Body Dressing/Undressing Upper body dressing   What is the patient wearing?: Pull over shirt    Upper body assist Assist Level: Minimal Assistance - Patient > 75%    Lower Body Dressing/Undressing Lower body dressing      What is the patient wearing?: Hospital gown only     Lower body assist Assist for lower body dressing: Independent     Toileting Toileting    Toileting assist Assist for toileting: Contact Guard/Touching assist     Transfers Chair/bed transfer  Transfers assist     Chair/bed transfer assist level: Contact Guard/Touching assist     Locomotion Ambulation   Ambulation assist      Assist level: Contact Guard/Touching assist Assistive device: Walker-rolling Max distance: 50'   Walk 10 feet activity   Assist     Assist level: Contact Guard/Touching assist Assistive device: Walker-rolling   Walk 50 feet activity   Assist    Assist level: Contact Guard/Touching assist Assistive device: Walker-rolling    Walk 150 feet activity   Assist Walk 150 feet activity did not occur: Safety/medical concerns         Walk 10 feet on uneven surface  activity   Assist Walk 10 feet on  uneven surfaces activity did not occur: Safety/medical concerns         Wheelchair     Assist Will patient use wheelchair at discharge?: No             Wheelchair 50 feet with 2 turns activity    Assist            Wheelchair 150 feet activity     Assist          Medical Problem List and Plan: 1.  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  Status post left suboccipital craniectomy gross total resection on 02/06/2021.    Continue CIR  Plan to wean Decadron 2.  Impaired mobility -DVT/anticoagulation: Continue SCDs             -antiplatelet therapy: N/A 3. Postoperative pain: Decrease Norco to 1 tab q8H prn.  Controlled on 4/19 4. Mood:  Provide emotional support             -antipsychotic agents: N/A 5. Neuropsych: This patient is?  Fully capable of making decisions on her own behalf. 6. Skin/Wound Care: Routine skin checks 7. Fluids/Electrolytes/Nutrition: Routine in and outs. 8.  Constipation.  Colace 100 mg twice daily, MiraLAX as needed. Added senna 1 tabs HS.    Controlled on 4/19 9.  Tobacco use.  Counseling 10. Overweight BMI 28.72-->28.34: provided significant education regarding healthy diet.  11. Impaired memory: continue SLP.  12. S/p left subocciptal craniectomy: on decadron 4mg  q8H.  13. Dizziness: meclizine 12.5mg  BID PRN added  ?  Improving on 4/19  14.  Transaminitis  ALT elevated on 4/18, continue to monitor  LOS: 4 days A FACE TO FACE EVALUATION WAS PERFORMED  Ranita Stjulien Lorie Phenix 02/14/2021, 8:24 AM

## 2021-02-14 NOTE — Progress Notes (Signed)
Physical Therapy Session Note  Patient Details  Name: Teresa [name] MRN: [id number] Date of Birth: 09-30-1951  Today's Date: 02/14/2021 PT Individual Time:Session1: 1000-1100; Session2: 1300-1400 PT Individual Time Calculation (min): 60 min & 60 min  Short Term Goals: Week 1:  PT Short Term Goal 1 (Week 1): STG = LTG d/t short ELOS  Skilled Therapeutic Interventions/Progress Updates:    Session1:  Patient handoff from OT and in bathroom. Squat up from toilet to perform toilet hygiene with S.  Patient sit to stand to RW with S and ambulated to sink with CGA.  Standing for handwashing with S.  Returned to sit EOB to rest momentarily.  In person Spanish interpreter arrived and remained throughout rest of the session.  Patient ambulated with RW and CGA to close S x 44' with w/c follow.  Patient assisted in w/c to dayroom.  Stand pivot to mat with CGA.  Sit to supine with S and placed wedge under pillows as pt reports MD says needs to have head elevated.  Performed supine therex as noted below with rest breaks in between.  Performed supine to sit with S from R sidelying with cues.  Patient sit<>stand x 5 reps for balance and LE strength.  Patient ambulated 38' to/from stairwell with RW and CGA increased time.  Negotiated 10 steps in stairwell with R railing sideways technique with min A.  Patient resting in w/c and then ambulated another 70 to bathroom in her room.  Completed toileting task with S.  Ambulated to EOB with RW 10' and sit to supine with S.  Left with call bell in reach and bed alarm set.   Session2:Patient in supine and requesting to toilet.  Patient supine to sit with elevated HOB and S.  Sit to stand S to RW and ambulated to bathroom x 12' with S increased time.  Patient toileted with close S including hygiene and clothing management.  Standing to wash hands at sink with S.  Patient ambulated x 22' with CGA to S with increased time, much encouragement to continue despite fatigue and  heavy headedness.  Patient assisted in w/c to therapy gym in parallel bars to perform standing balance activities including side stepping, gait without UE support, stepping forward over cones with cues for step over step technique with UE support, stepping sideways over cones.  Then standing to step tap to cones with one foot at a time with CGA for balance.  Patient ambulated x 65' to bathroom and toileted with S.  Standing at sink to brush teeth and wash hands with S and ambulated to bed with RW sit to supine with S, cues for positioning.  Left with needs in reach and bed alarm set.   Therapy Documentation Precautions:  Precautions Precautions: Fall Restrictions Weight Bearing Restrictions: No Other Position/Activity Restrictions: ROM to c/spine as tolerated Pain: Pain Assessment Pain Score: 0-No pain   Exercises: General Exercises - Lower Extremity Hip ABduction/ADduction: Strengthening;Both;10 reps;Sidelying (clamshell) Hip Flexion/Marching: Strengthening;Both;10 reps;Supine (hooklying) Total Joint Exercises Bridges: Strengthening;Both;10 reps;Supine (3 sec hold)  Therapy/Group: Individual Therapy  Reginia Naas  [place], PT 02/14/2021, 12:02 PM

## 2021-02-14 NOTE — Progress Notes (Signed)
Occupational Therapy Session Note  Patient Details  Name: Teresa [name] MRN: [id number] Date of Birth: 1951-01-03  Today's Date: 02/14/2021 OT Individual Time: 0856-1000 OT Individual Time Calculation (min): 64 min    Short Term Goals: Week 1:  OT Short Term Goal 1 (Week 1): STGs = LTGs d/t ELOS at Supervision level  Skilled Therapeutic Interventions/Progress Updates:    Patient in bed, alert and aware of needs.  She denies pain.  Interpreter present for session.  Reports improving fatigue and occ dizziness with increased tempo of movement.  Supine to sitting edge of bed with CS.  Able to donn c-style shirt with CS/set up.  Sit to stand and ambulation with RW to/from bed, toilet with CS.  Completed toileting with CS, hand hygiene/oral care with CS in stance at sink.  She is able to gather items in room with CS and min cues for hand placement/walker placement.  Ambulation on unit approx 100 feet.  Completed upper body ergometer 2 x 3 minutes at slow rate.  Dowel exercises at slow rate - utilized ball to increase speed of movement with fair tolerance.  Completed visual motor activities - VOR, pursuits and accomodation - with mild strain at slowed rate.  Box and blocks:  R = 59, L = 48.   Returned to room, completed toileting for 2nd time with CS.  Hand off to PT at this time.    Therapy Documentation Precautions:  Precautions Precautions: Fall Restrictions Weight Bearing Restrictions: No Other Position/Activity Restrictions: ROM to c/spine as tolerated   Therapy/Group: Individual Therapy  Carlos Levering 02/14/2021, 7:36 AM

## 2021-02-14 NOTE — Progress Notes (Signed)
Patient ID: Teresa [name], female   DOB: 1951-07-22, 70 y.o.   MRN: [id number] Met with the patient to introduce self and role of the nurse CM. Reviewed general healthy dietary modifications and protein rich food options. Reviewed treatment for constipation and medication/decadron. Patient given information on items reviewed in spanish and asked if we would follow up with her daughter on the information as well. Continue to follow along to discharge to address educational needs and collaborate with the SW to facilitate preparation for discharge. Margarito Liner

## 2021-02-15 ENCOUNTER — Other Ambulatory Visit: Payer: Self-pay

## 2021-02-15 MED ORDER — HEPARIN SOD (PORK) LOCK FLUSH 100 UNIT/ML IV SOLN
500.0000 [IU] | INTRAVENOUS | Status: AC | PRN
  Administered 2021-02-15: 500 [IU]
  Filled 2021-02-15: qty 5

## 2021-02-15 MED ORDER — DEXAMETHASONE 4 MG PO TABS
4.0000 mg | ORAL_TABLET | Freq: Two times a day (BID) | ORAL | Status: DC
  Administered 2021-02-15 – 2021-02-17 (×4): 4 mg via ORAL
  Filled 2021-02-15 (×4): qty 1

## 2021-02-15 NOTE — Progress Notes (Signed)
Physical Therapy Session Note  Patient Details  Name: Teresa [name] MRN: [id number] Date of Birth: 11-Nov-1950  Today's Date: 02/15/2021 PT Individual Time:Session1: 0902-1000; [id number] PT Individual Time Calculation (min): 58 min & 48 min  Short Term Goals: Week 1:  PT Short Term Goal 1 (Week 1): STG = LTG d/t short ELOS  Skilled Therapeutic Interventions/Progress Updates:    Session1: Patient in supine and resting reports no issues.  Seen throughout session with in person Spanish interpreter, Alleen Borne.  Patient educated on team conference today and plans for possible d/c end of week pending team consensus.  PAtient supine to sit with S and ambulated to bathroom with RW with S.  Toileted with distant S.  Patient ambulated x 110' with RW and S, cues for pushing through symptoms. Patient seated for postural strengthening rows w/ orange t-band, then diagonal shoulder elevation with opposite end anchored at hip all x 10 with mod cues throughout.  Sit to supine with S with wedge under her head.  Patient performed head press x 10 w/ 5 sec hold with cues throughout.  Patient supine to sit with S.  Ambulated to stair well with S with RW, then negotiated flight with R rail and CGA side step technique with both hands to rail.  Patient ambulated another 65' with RW and S, then requesting seat.  Assisted to room in w/c and requesting to lie down.  Stand pivot to bed with S.  Left seated EOB with interpreter in the room.  Session2:Patient in supine finishing lunch as reports was asleep then had to use the bathroom.  Missed 12 minutes of skilled PT due to eating lunch.  Patient seen with in person interpreter, Rosemarie Ax, for session.  Performed supine to sit and sit to stand with S.  Patient donned jacket in standing with S for balance no UE support.  Patient ambulated 140' with RW and S w/c close if needed and cues for fluency of gait (less pauses) and for step length.  Patient performed standing  balance no UE support to fold towels with feet apart, then with R foot on step.  Patient standing with 1 foot on step to place ball on mat to R then pick up and place on mat to L, kept hand on ball as pt reported for balance when not retrieving or placing ball.  Patient performed same with feet together.  Reported some mild dizziness, but mainly feeling off balance.  Patient ambulated without device x 50' with 1 turn with CGA and cues for balance.  Ambulated through floor ladder with hand held assist for stride length and fluency of gait.  Patient ambulated back toward room with RW and S with improved gait speed, fluency and stride length without needing cues.  Needed to sit prior to getting to her room about 100' total.  Patient assisted to bathroom and to brush teeth at sink with S.  Requesting back to bed as she states usually sits up in recliner later in the evening.  Left with all needs in reach and bed alarm active.   Therapy Documentation Precautions:  Precautions Precautions: Fall Restrictions Weight Bearing Restrictions: No Other Position/Activity Restrictions: ROM to c/spine as tolerated General: PT Amount of Missed Time (min): 12 Minutes PT Missed Treatment Reason: Other (Comment) (finishing lunch) Pain: Pain Assessment Pain Scale: 0-10 Pain Score: 0-No pain    Therapy/Group: Individual Therapy  Reginia Naas  Magda Kiel, PT 02/15/2021, 9:44 AM

## 2021-02-15 NOTE — Progress Notes (Signed)
Spoke with Benjie Karvonen RN re PAC reaccess due today.  No IV meds ordered, potential for d/c.  Benjie Karvonen RN to clarify during rounds this am if PAC can be deaccessed due to no current need.

## 2021-02-15 NOTE — Progress Notes (Signed)
Occupational Therapy Session Note  Patient Details  Name: Teresa [name] MRN: [id number] Date of Birth: [birth year]  Today's Date: 02/15/2021 OT Individual Time: 1000-1055 OT Individual Time Calculation (min): 55 min    Short Term Goals: Week 1:  OT Short Term Goal 1 (Week 1): STGs = LTGs d/t ELOS at Supervision level  Skilled Therapeutic Interventions/Progress Updates:    Pt sitting EOB upon arrival with interpreter present. Pt requested to use bathroom. Amb with RW and toileting with supervision. Pt amb with RW in hallway (13') with supervision before requesting to rest. Transitioned to gym and engaged in [place] activities for 2 mins and 1 min-avg reaction time of 1.83 secs with rest break. Pt completed BUE threx on [employer] 4 mins and 2 mins level 4 with avg rpm of 10. Pt commented that if she goes "fast" she gets tired quickly. Pt returned to room and amb with RW to bed. Sit>supine with supervision. Pt remained in bed with all needs within reach.  Bed alarm activated.  Therapy Documentation Precautions:  Precautions Precautions: Fall Restrictions Weight Bearing Restrictions: No Other Position/Activity Restrictions: ROM to c/spine as tolerated  Pain: Pt c/o Bil scapulae pain with standing; repositioned and soft tissue mobiliztions     Therapy/Group: Individual Therapy  Leroy Libman 02/15/2021, 11:00 AM

## 2021-02-15 NOTE — Progress Notes (Signed)
Patient ID: Teresa [name], female   DOB: [birth year], 70 y.o.   MRN: [id number]  Spoke with daughter via telephone to discuss team conference goals supervision and target discharge date 4/22. She will be there with here at home and plans to stay the night tonight so can go through therapies with pt tomorrow. Will inform therapy team regarding this.

## 2021-02-15 NOTE — Progress Notes (Signed)
PROGRESS NOTE   Subjective/Complaints: Patient seen sitting up in bed this morning.  She states she slept well overnight.  No reported issues overnight.  ROS: Denies CP, SOB, N/V/D  Objective:   No results found. Recent Labs    02/13/21 0457  WBC 8.7  HGB 12.9  HCT 38.9  PLT 206   Recent Labs    02/13/21 0457  NA 135  K 3.8  CL 102  CO2 27  GLUCOSE 128*  BUN 18  CREATININE 0.76  CALCIUM 9.2    Intake/Output Summary (Last 24 hours) at 02/15/2021 0859 Last data filed at 02/15/2021 0700 Gross per 24 hour  Intake 788 ml  Output --  Net 788 ml        Physical Exam: Vital Signs Blood pressure 106/74, pulse (!) 56, temperature (!) 97.5 F (36.4 C), resp. rate 20, height 5\' 1"  (1.549 m), weight 68 kg, SpO2 96 %. Constitutional: No distress . Vital signs reviewed. HENT: Normocephalic.  Atraumatic. Eyes: EOMI. No discharge. Cardiovascular: No JVD.  RRR. Respiratory: Normal effort.  No stridor.  Bilateral clear to auscultation. GI: Non-distended.  BS +. Skin: Warm and dry.  Intact. Psych: Normal mood.  Normal behavior. Musc: No edema in extremities.  No tenderness in extremities. Neuro: Alert Follows simple commands.   Motor: 5/5 throughout  Assessment/Plan: 1. Functional deficits which require 3+ hours per day of interdisciplinary therapy in a comprehensive inpatient rehab setting.  Physiatrist is providing close team supervision and 24 hour management of active medical problems listed below.  Physiatrist and rehab team continue to assess barriers to discharge/monitor patient progress toward functional and medical goals  Care Tool:  Bathing    Body parts bathed by patient: Right arm,Left arm,Chest,Abdomen,Front perineal area,Buttocks,Right upper leg,Left upper leg,Right lower leg,Left lower leg,Face         Bathing assist Assist Level: Supervision/Verbal cueing     Upper Body  Dressing/Undressing Upper body dressing   What is the patient wearing?: Button up shirt    Upper body assist Assist Level: Supervision/Verbal cueing    Lower Body Dressing/Undressing Lower body dressing      What is the patient wearing?: Hospital gown only     Lower body assist Assist for lower body dressing: Independent     Toileting Toileting    Toileting assist Assist for toileting: Supervision/Verbal cueing     Transfers Chair/bed transfer  Transfers assist     Chair/bed transfer assist level: Contact Guard/Touching assist     Locomotion Ambulation   Ambulation assist      Assist level: Contact Guard/Touching assist Assistive device: Walker-rolling Max distance: 50'   Walk 10 feet activity   Assist     Assist level: Contact Guard/Touching assist Assistive device: Walker-rolling   Walk 50 feet activity   Assist    Assist level: Contact Guard/Touching assist Assistive device: Walker-rolling    Walk 150 feet activity   Assist Walk 150 feet activity did not occur: Safety/medical concerns         Walk 10 feet on uneven surface  activity   Assist Walk 10 feet on uneven surfaces activity did not occur: Safety/medical concerns  Wheelchair     Assist Will patient use wheelchair at discharge?: No             Wheelchair 50 feet with 2 turns activity    Assist            Wheelchair 150 feet activity     Assist          Medical Problem List and Plan: 1.  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  Status post left suboccipital craniectomy gross total resection on 02/06/2021.    Continue CIR  Will begin weaning Decadron  Team conference today to discuss current and goals and coordination of care, home and environmental barriers, and discharge planning with nursing, case manager, and therapies. Please see conference note from  today as well.  2.  Impaired mobility -DVT/anticoagulation: Continue SCDs             -antiplatelet therapy: N/A 3. Postoperative pain: Decrease Norco to 1 tab q8H prn.  Controlled on 4/20 4. Mood: Provide emotional support             -antipsychotic agents: N/A 5. Neuropsych: This patient is?  Fully capable of making decisions on her own behalf. 6. Skin/Wound Care: Routine skin checks 7. Fluids/Electrolytes/Nutrition: Routine in and outs. 8.  Constipation.  Colace 100 mg twice daily, MiraLAX as needed. Added senna 1 tabs HS.    Improving 9.  Tobacco use.  Counseling 10. Overweight BMI 28.72-->28.34: provided significant education regarding healthy diet.  11. Impaired memory: continue SLP.  12. S/p left subocciptal craniectomy: on decadron 4mg  q8H.  13. Dizziness: meclizine 12.5mg  BID PRN added  Improving on 4/20 14.  Transaminitis  ALT elevated on 4/18, plan to repeat labs end of this week 15.  Steroid-induced hyperglycemia  Elevated on 4/18  Monitor with steroid wean  LOS: 5 days A FACE TO FACE EVALUATION WAS PERFORMED  Brendalyn Vallely Lorie Phenix 02/15/2021, 8:59 AM

## 2021-02-15 NOTE — Patient Care Conference (Signed)
Inpatient RehabilitationTeam Conference and Plan of Care Update Date: 02/15/2021   Time: 11:38 AM    Patient Name: Teresa [name]      Medical Record Number: [id number]  Date of Birth: 09-23-1951 Sex: Female         Room/Bed: 4M06C/4M06C-01 Payor Info: Payor: /    Admit Date/Time:  02/10/2021  4:18 PM  Primary Diagnosis:  Brain tumor [facility])  Hospital Problems: Principal Problem:   Brain tumor [facility]) Active Problems:   Transaminitis   Dizziness and giddiness   Slow transit constipation   Postoperative pain    Expected Discharge Date: Expected Discharge Date: 02/17/21  Team Members Present: Physician leading conference: Dr. Delice Lesch Care Coodinator Present: Ovidio Kin, LCSW;Squire Withey Hervey Ard, RN, BSN, [place] Nurse Present: Dorien Chihuahua, RN PT Present: Magda Kiel, PT OT Present: Lillia Corporal, OT PPS Coordinator present : Gunnar Fusi, SLP     Current Status/Progress Goal Weekly Team Focus  Bowel/Bladder             Swallow/Nutrition/ Hydration             ADL's   bathing CS seated, dressing min A, toileting CS, functional transfers with RW CS  CS/set up  DME, transfer training, adl training, family education, balance and general conditioning   Mobility   S to CGA for ambulation up to 93' with RW much increased time, 10 steps with R rail in stairwell CGA, transfers and bed mobility S.  S overall  activity tolerance, energy conservation education, balance, habituation, gait, stairs; * needs caregiver ed   Communication             Safety/Cognition/ Behavioral Observations            Pain             Skin               Discharge Planning:  Home with daughter who has taken off work to provide 24/7 supervision. Pt doing well in therapies, will not have follow up due to uninsured   Team Discussion: MD monitoring lab values. Taper steroids. Progress limited by fatigue. Patient on target to meet rehab goals: yes, currently close supervision for bathing upper  body and toileting. Min assist for bathing lower body. Close supervision for transfers. Able to ambulate 80'-100' with a rolling walker and supervision and manage 10 steps with right rail. Supervision goals set for discharge.  *See Care Plan and progress notes for long and short-term goals.   Revisions to Treatment Plan:   Teaching Needs: Transfers, toileting, medications, etc.   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  Status post left suboccipital craniectomy gross total resection on 02/06/2021.  Barriers to Discharge: Medical stability  Barriers to Discharge Comments: D/c therapies limited due to ins Possible Resolutions to Barriers/Weekly Focus: Therapies, wean steroids, monitor blood sugards with steroid wean, follow labs -ALT, monitor dizziness, optimize pain meds, pt and family edu   Continued Need for Acute Rehabilitation Level of Care: The patient requires daily medical management by a physician with specialized training in physical medicine and rehabilitation for the following reasons: Direction of a multidisciplinary physical rehabilitation program to maximize functional independence :  Yes Medical management of patient stability for increased activity during participation in an intensive rehabilitation regime.: Yes Analysis of laboratory values and/or radiology reports with any subsequent need for medication adjustment and/or medical intervention. : Yes   I attest that I was present, lead the team conference, and concur with the assessment and plan of the team.   Dorien Chihuahua B 02/15/2021, 1:42 PM

## 2021-02-15 NOTE — Discharge Summary (Signed)
Physician Discharge Summary  Patient ID: Teresa [name] MRN: [id number] DOB/AGE: October 13, 1951 70 y.o.  Admit date: 02/10/2021 Discharge date: 02/17/2021  Discharge Diagnoses:  Principal Problem:   Brain tumor [facility]) Active Problems:   Transaminitis   Dizziness and giddiness   Slow transit constipation   Postoperative pain   Steroid-induced hyperglycemia Tobacco use   Discharged Condition: Stable  Significant Diagnostic Studies: MR BRAIN W WO CONTRAST  Result Date: 02/07/2021 CLINICAL DATA:  Follow-up examination status post tumor resection. EXAM: MRI HEAD WITHOUT AND WITH CONTRAST TECHNIQUE: Multiplanar, multiecho pulse sequences of the brain and surrounding structures were obtained without and with intravenous contrast. CONTRAST:  69mL GADAVIST GADOBUTROL 1 MMOL/ML IV SOLN COMPARISON:  Prior MRI from 01/13/2021. FINDINGS: Brain: Postoperative changes from interval left suboccipital craniotomy for tumor resection are seen. Postoperative blood products seen throughout the resection cavity at the left cerebellum, with superimposed air-fluid level. The previously seen cystic tumor has largely been resected. Irregular serpiginous and patchy enhancement about the resection cavity favored to in large part be postoperative in nature. No definite residual tumor on this immediate postcontrast examination. Residual vasogenic edema and mass effect within the left cerebellar hemisphere is relatively similar to preoperative exam. No infarct or other complication seen about the resection cavity. Postoperative pneumocephalus noted overlying the anterior frontal convexities as well. Remainder of the brain is otherwise stable. Mild cerebral white matter changes involving the supratentorial cerebral white matter noted, nonspecific, but most like related chronic microvascular ischemic disease. No evidence for acute infarct. No other acute intracranial hemorrhage. No other mass lesion or abnormal enhancement.  No hydrocephalus or extra-axial collection. Vascular: Major intracranial vascular flow voids remain patent and well preserved. Normal flow voids seen within the major dural sinuses. Skull and upper cervical spine: Craniocervical junction within normal limits. Bone marrow signal intensity normal. Sequelae of interval left suboccipital craniotomy. Calvarium and scalp soft tissues otherwise unremarkable. Sinuses/Orbits: Globes and orbital soft tissues within normal limits. Paranasal sinuses remain largely clear. No significant mastoid effusion. Other: None. IMPRESSION: 1. Postoperative changes from interval left suboccipital craniotomy for tumor resection without complication or adverse features. Gross total resection of the left cerebellar mass, without definite residual tumor on this immediate postoperative examination. Residual vasogenic edema and mass effect on the adjacent fourth ventricle without hydrocephalus, similar to previous. 2. No other new acute intracranial abnormality. Electronically Signed   By: [name] M.D.   On: 02/07/2021 04:29    Labs:  Basic Metabolic Panel: Recent Labs  Lab 02/13/21 0457 02/16/21 1122  NA 135 135  K 3.8 3.8  CL 102 105  CO2 27 24  GLUCOSE 128* 109*  BUN 18 21  CREATININE 0.76 0.70  CALCIUM 9.2 9.0    CBC: Recent Labs  Lab 02/13/21 0457  WBC 8.7  NEUTROABS 7.1  HGB 12.9  HCT 38.9  MCV 92.8  PLT 206    CBG: No results for input(s): GLUCAP in the last 168 hours.  Family history.  Positive for hypertension as well as hyperlipidemia.  Denies any colon cancer esophageal cancer or rectal cancer  Brief HPI:   Teresa [name] is a 69 y.o. right-handed Hispanic female with history of breast cancer status post breast lumpectomy 2015 including previous spread of disease into her lungs as well as history of tobacco use.  She last received her 24 cycle of TDM-1 late August 2021 followed by Dr. Lavera Guise.  Per chart review she lives  with her daughter.  Reportedly independent prior  to admission.  Two-level home bed and bath upstairs.  Presented 02/06/2021 with persistent headache nausea and unsteady gait.  CT of the brain and MRI imaging demonstrated a left cerebellar tumor consistent with metastasis.  Patient underwent left suboccipital craniectomy for gross total resection of left cerebellar tumor using microdissection 02/06/2021 per Dr. Arnoldo Morale.  Maintained on Decadron protocol.  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  Tolerating a regular diet.  Therapy evaluations completed due to patient's decreased functional mobility was admitted for a comprehensive rehab program.   Hospital Course: Teresa [name] was admitted to rehab 02/10/2021 for inpatient therapies to consist of PT, ST and OT at least three hours five days a week. Past admission physiatrist, therapy team and rehab RN have worked together to provide customized collaborative inpatient rehab.  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  Surgical site healing nicely taper of Decadron.  Patient would follow-up with neurosurgery Dr. Arnoldo Morale as well as oncology Dr. Lavera Guise for plan of care.  Pain managed use of hydrocodone as needed.  Bouts of constipation resolved with laxative assistance.  Tobacco use of which patient family receive counts regards to cessation of nicotine products.  Bouts of constipation resolved with laxative assistance.  Mildly elevated LFTs and monitored.   Blood pressures were monitored on TID basis and controlled     Rehab course: During patient's stay in rehab weekly team conferences were held to monitor patient's progress, set goals and discuss barriers to discharge. At admission, patient required minimal assist 45 feet rolling walker minimal assist  sit to stand minimal assist supine to sit minimal assist ADLs  Physical exam.  Blood pressure 102/62 pulse 52 temperature 97.8 respirations 19 oxygen saturation is 95% room air Constitutional.  No acute distress HEENT Head.  Craniectomy site clean and dry Eyes.  Pupils round and reactive to light no discharge without nystagmus Neck.  Supple nontender no JVD without thyromegaly Cardiac regular rate rhythm without extra sounds or murmur heard Abdomen.  Soft nontender positive bowel sounds without rebound Respiratory effort normal no respiratory distress without wheeze Neurologic.  Alert oriented non-English-speaking daughter helped with translation.  No acute distress.  Follows commands.  She answers questions appropriately.  5/5 strength throughout  He/She  has had improvement in activity tolerance, balance, postural control as well as ability to compensate for deficits. He/She has had improvement in functional use RUE/LUE  and RLE/LLE as well as improvement in awareness.  Squat up from toilet to perform toilet hygiene supervision.  Ambulates rolling walker supervision.  Standing for handwashing supervision.  Walks up to 70-80 feet.  Navigate stairs with contact-guard assist.  Gather his belongings for activities day living and homemaking.  It was stressed to family need for supervision for her safety.  Full family teaching completed plan discharged home       Disposition: Discharged to home    Diet: Regular  Special Instructions: No driving smoking or alcohol  Follow-up oncology services Dr. Lavera Guise [phone] in regards to oncology care  Medications at discharge. 1.  Tylenol as needed 2.  Decadron taper as indicated 3.  Colace 100 mg p.o. twice daily 4.  Hydrocodone 1 tablet every 8 hours as needed moderate pain 5.  Antivert 12.5 mg p.o. twice daily as needed dizziness 6.  Protonix 40 mg p.o. nightly 7.  MiraLAX nightly hold for loose  stools 8.  Senokot 2 tablets  nightly.  Hold for loose stools 9.  Potassium 99 mg daily after lunch 10.  Vitamin D daily 11.  Omega-3 2 capsules daily 12.  Magnesium oxide 400 mg daily   30-35 minutes were spent completing discharge summary and discharge planning  Discharge Instructions    Ambulatory referral to Physical Medicine Rehab   Complete by: As directed    Moderate complexity follow-up 1 to 2 weeks brain tumor/metastatic poorly differentiated carcinoma       Follow-up Information    Jamse Arn, MD Follow up.   Specialty: Physical Medicine and Rehabilitation Why: Office to call for appointment Contact information: [address] [place] [address] [phone]        Newman Pies, MD Follow up.   Specialty: Neurosurgery Why: Call for appointment Contact information: [address] [place] [address] [phone]        Marice Potter, MD Follow up.   Specialty: Oncology Why: Call for appointment Contact information: [place] [address] [phone]        Marguerita Merles, MD Follow up.   Specialty: Family Medicine Contact information: [place] [address] [phone]               Signed: Lavon Paganini [place] 02/17/2021, 5:17 AM

## 2021-02-16 ENCOUNTER — Other Ambulatory Visit (HOSPITAL_COMMUNITY): Payer: Self-pay

## 2021-02-16 DIAGNOSIS — R739 Hyperglycemia, unspecified: Secondary | ICD-10-CM

## 2021-02-16 DIAGNOSIS — T380X5A Adverse effect of glucocorticoids and synthetic analogues, initial encounter: Secondary | ICD-10-CM

## 2021-02-16 LAB — BASIC METABOLIC PANEL
Anion gap: 6 (ref 5–15)
BUN: 21 mg/dL (ref 8–23)
CO2: 24 mmol/L (ref 22–32)
Calcium: 9 mg/dL (ref 8.9–10.3)
Chloride: 105 mmol/L (ref 98–111)
Creatinine, Ser: 0.7 mg/dL (ref 0.44–1.00)
GFR, Estimated: >60 mL/min (ref >60)
Glucose, Bld: 109 mg/dL — ABNORMAL HIGH (ref 70–99)
Potassium: 3.8 mmol/L (ref 3.5–5.1)
Sodium: 135 mmol/L (ref 135–145)

## 2021-02-16 MED ORDER — DOCUSATE SODIUM 100 MG PO CAPS: 100.0000 mg | ORAL_CAPSULE | Freq: Two times a day (BID) | ORAL | 0 refills | Status: DC

## 2021-02-16 MED ORDER — B COMPLEX-C PO TABS
1.0000 | ORAL_TABLET | Freq: Every day | ORAL | 0 refills | Status: DC
  Filled 2021-02-16: qty 30, 30d supply, fill #0

## 2021-02-16 MED ORDER — PANTOPRAZOLE SODIUM 40 MG PO TBEC
40.0000 mg | DELAYED_RELEASE_TABLET | Freq: Every day | ORAL | 0 refills | Status: DC
  Filled 2021-02-16: qty 30, 30d supply, fill #0

## 2021-02-16 MED ORDER — ACETAMINOPHEN 325 MG PO TABS: 650.0000 mg | ORAL_TABLET | ORAL | Status: AC | PRN

## 2021-02-16 MED ORDER — MAGNESIUM OXIDE 400 MG PO TABS
400.0000 mg | ORAL_TABLET | Freq: Every day | ORAL | 0 refills | Status: DC
  Filled 2021-02-16: qty 30, 30d supply, fill #0

## 2021-02-16 MED ORDER — POTASSIUM 99 MG PO TABS
99.0000 mg | ORAL_TABLET | Freq: Every day | ORAL | 0 refills | Status: AC
  Filled 2021-02-16: qty 20, 20d supply, fill #0

## 2021-02-16 MED ORDER — MECLIZINE HCL 12.5 MG PO TABS
12.5000 mg | ORAL_TABLET | Freq: Two times a day (BID) | ORAL | 0 refills | Status: AC | PRN
  Filled 2021-02-16: qty 30, 15d supply, fill #0

## 2021-02-16 MED ORDER — DEXAMETHASONE 4 MG PO TABS
4.0000 mg | ORAL_TABLET | Freq: Every day | ORAL | 0 refills | Status: DC
  Filled 2021-02-16: qty 3, 3d supply, fill #0

## 2021-02-16 MED ORDER — HYDROCODONE-ACETAMINOPHEN 5-325 MG PO TABS
1.0000 | ORAL_TABLET | Freq: Three times a day (TID) | ORAL | 0 refills | Status: DC | PRN
  Filled 2021-02-16: qty 30, 10d supply, fill #0

## 2021-02-16 MED ORDER — SENNA 8.6 MG PO TABS: 2.0000 | ORAL_TABLET | Freq: Every day | ORAL | 0 refills | Status: DC

## 2021-02-16 MED ORDER — POLYETHYLENE GLYCOL 3350 17 G PO PACK: 17.0000 g | PACK | Freq: Two times a day (BID) | ORAL | 0 refills | Status: DC | PRN

## 2021-02-16 NOTE — Plan of Care (Signed)
  Problem: RH Balance Goal: LTG Patient will maintain dynamic sitting balance (PT) Description: LTG:  Patient will maintain dynamic sitting balance with assistance during mobility activities (PT) Outcome: Completed/Met Goal: LTG Patient will maintain dynamic standing balance (PT) Description: LTG:  Patient will maintain dynamic standing balance with assistance during mobility activities (PT) Outcome: Completed/Met   Problem: Sit to Stand Goal: LTG:  Patient will perform sit to stand with assistance level (PT) Description: LTG:  Patient will perform sit to stand with assistance level (PT) Outcome: Completed/Met   Problem: RH Bed Mobility Goal: LTG Patient will perform bed mobility with assist (PT) Description: LTG: Patient will perform bed mobility with assistance, with/without cues (PT). Outcome: Completed/Met   Problem: RH Bed to Chair Transfers Goal: LTG Patient will perform bed/chair transfers w/assist (PT) Description: LTG: Patient will perform bed to chair transfers with assistance (PT). Outcome: Completed/Met   Problem: RH Car Transfers Goal: LTG Patient will perform car transfers with assist (PT) Description: LTG: Patient will perform car transfers with assistance (PT). Outcome: Completed/Met   Problem: RH Furniture Transfers Goal: LTG Patient will perform furniture transfers w/assist (OT/PT) Description: LTG: Patient will perform furniture transfers  with assistance (OT/PT). Outcome: Completed/Met   Problem: RH Ambulation Goal: LTG Patient will ambulate in controlled environment (PT) Description: LTG: Patient will ambulate in a controlled environment, # of feet with assistance (PT). Outcome: Completed/Met Goal: LTG Patient will ambulate in home environment (PT) Description: LTG: Patient will ambulate in home environment, # of feet with assistance (PT). Outcome: Completed/Met   Problem: RH Stairs Goal: LTG Patient will ambulate up and down stairs w/assist  (PT) Description: LTG: Patient will ambulate up and down # of stairs with assistance (PT) Outcome: Completed/Met  Magda Kiel, PT

## 2021-02-16 NOTE — Progress Notes (Signed)
Occupational Therapy Session Note  Patient Details  Name: Teresa [name] MRN: [id number] Date of Birth: 03-19-1951  Today's Date: 02/16/2021 OT Individual Time: 0900-1000   &   1300-1400 OT Individual Time Calculation (min): 60 min    &   60 min   Short Term Goals: Week 1:  OT Short Term Goal 1 (Week 1): STGs = LTGs d/t ELOS at Supervision level  Skilled Therapeutic Interventions/Progress Updates:    AM session:   Patient in bed, alert and ready for therapy session.  Daughter not present as planned due to family member illness.  Interpreter present for session.  Patient denies pain and requests a shower this session.   Neck incision covering reinforced.  Patient able to complete bed mobility mod I.  Sit to stand and ambulation with RW to/from bed, toilet, shower seat and w/c with CS.  Completes toileting with supervision, shower with set up, dressing in seated position with set up.  Grooming tasks seated mod I.  Called daughter and reviewed adl performance, precautions, safety, DME and recommendation for ongoing supervision upon return home via speaker phone.  Daughter with questions regarding follow up and second floor set up.  Discussed with PT who will call during her session today.  Daughter and patient confirm readiness for discharge tomorrow.  Patient in w/c, hand off to PT at this time.     PM session:   Patient in bed, finished with lunch.  She denies pain and states that her meal was wonderful.  Interpreter present for session.  Supine to sitting edge of bed with CS.  Sit to stand and ambulation with RW to/from bed, toilet and w/c with CS.  Toileting, hand hygiene, oral care completed with CS.  Reviewed set up of DME and proper height - she demonstrates good understanding.   She is able to ambulate with RW to therapy gym with CS.  Completed standing balance/visual motor activities using BITS system...   Trial 1:  2 min, dots with right hand = 1.57 sec reaction time Trial 2:  2 min,  dots with left hand = 1.56 sec reaction time Trail 3:  Saccades # 1 - 50 - total time = 3:45, reaction time = 4.5 sec  Completed reassessment and light exercises - see discharge summary for details.  Reviewed therapeutic activity and increasing tolerance for mobility and exercise upon return home.  Patient states that she feels much stronger and ready for discharge to home with daughter tomorrow.  Completed toileting with CS again at close of session, returned to bed, bed alarm set and call bell in hand.       Therapy Documentation Precautions:  Precautions Precautions: Fall Restrictions Weight Bearing Restrictions: No Other Position/Activity Restrictions: ROM to c/spine as tolerated  Therapy/Group: Individual Therapy  Carlos Levering 02/16/2021, 8:32 AM

## 2021-02-16 NOTE — Progress Notes (Signed)
Physical Therapy Session Note  Patient Details  Name: Teresa [name] MRN: [id number] Date of Birth: 03-13-1951  Today's Date: 02/16/2021 PT Individual Time:1002-1100    22 minutes  Short Term Goals: Week 1:  PT Short Term Goal 1 (Week 1): STG = LTG d/t short ELOS  Skilled Therapeutic Interventions/Progress Updates:    Patient in w/c just finished with OT showering.  Seen throughout session with in person Spanish interpreter, Rosemarie Ax.  Patient's daughter not here so discussed calling her for video call for caregiver education.  Patient called daughter and pt sit to stand to RW and ambulated x 100' with RW and S w/c close for safety and daughter educated throughout on patient's level of assistance for safety needing cues for fluency, posture and supervision for safety.  Patient in stairwell to negotiate 12 steps with R rail and CGA daughter able to visualize staying on downside and managing walker for her as well as pt's technique with side step/step to technique using both hands to the R railing.  Patient assisted in w/c to car simulator in ortho gym and performed transfer with RW to simulated SUV height with S and cues for technique.  Daughter visualized throughout providing input on car height ([employer] Ex[employer]) and verbalized understanding of levels of assistance for safe mobility for her mother.  Patient seated for strength and coordination testing then performed Berg balance assessment with results as noted below scored 44/56.  Patient assisted in w/c to room and requesting to toilet.  Performed toileting with S and pt requesting walker to leave bathroom as assisted in w/c to bathroom showing appropriate safety and insight into her deficits.  Patient left seated EOB with all needs in reach and bed alarm active.   Therapy Documentation Precautions:  Precautions Precautions: Fall Restrictions Weight Bearing Restrictions: No Other Position/Activity Restrictions: ROM to c/spine as  tolerated Pain: Pain Assessment Pain Scale: 0-10 Pain Score: 0-No pain   Balance: Balance Balance Assessed: Yes Standardized Balance Assessment Standardized Balance Assessment: Berg Balance Test Berg Balance Test Sit to Stand: Able to stand  independently using hands Standing Unsupported: Able to stand safely 2 minutes Sitting with Back Unsupported but Feet Supported on Floor or Stool: Able to sit safely and securely 2 minutes Stand to Sit: Sits safely with minimal use of hands Transfers: Able to transfer safely, definite need of hands Standing Unsupported with Eyes Closed: Able to stand 10 seconds safely Standing Ubsupported with Feet Together: Able to place feet together independently and stand for 1 minute with supervision From Standing, Reach Forward with Outstretched Arm: Can reach confidently >25 cm (10") From Standing Position, Pick up Object from Floor: Able to pick up shoe, needs supervision From Standing Position, Turn to Look Behind Over each Shoulder: Looks behind from both sides and weight shifts well Turn 360 Degrees: Able to turn 360 degrees safely but slowly Standing Unsupported, Alternately Place Feet on Step/Stool: Able to complete 4 steps without aid or supervision Standing Unsupported, One Foot in Front: Able to plae foot ahead of the other independently and hold 30 seconds Standing on One Leg: Tries to lift leg/unable to hold 3 seconds but remains standing independently Total Score: 44    Therapy/Group: Individual Therapy  Reginia Naas  Magda Kiel, PT 02/16/2021, 5:45 PM

## 2021-02-16 NOTE — Progress Notes (Signed)
Inpatient Rehabilitation Care Coordinator Discharge Note  The overall goal for the admission was met for:   Discharge location: Yes-HOME WITH DAUGHTER WHO CAN PROVIDE 24/7 SUPERVISION  Length of Stay: Yes-7 DAYS  Discharge activity level: Yes-SUPERVISION LEVEL  Home/community participation: Yes  Services provided included: MD, RD, PT, OT, SLP, RN, CM, Pharmacy and SW  Financial Services: Other: UNINSURED  Choices offered to/list presented to:YES  Follow-up services arranged: DME: [facility]-YOUTH ROLLING WALKER, 3 IN 1 AND TUB SEAT and Patient/Family has no preference for HH/DME agencies [place]. MATCH PLACED FOR ASSIST WITH MEDICATION  Comments (or additional information):DAUGHTER WAS HERE FOR EDUCATION AND FEELS COMFORTABLE WITH HER CARE NEEDS AT DC.  Patient/Family verbalized understanding of follow-up arrangements: Yes  Individual responsible for coordination of the follow-up plan: [facility] [phone]  Confirmed correct DME delivered: Elease Hashimoto 02/16/2021    Garris Melhorn, Gardiner Rhyme

## 2021-02-16 NOTE — Progress Notes (Signed)
Physical Therapy Discharge Summary  Patient Details  Name: Teresa [name] MRN: [id number] Date of Birth: 12-13-1950   Patient has met 10 of 10 long term goals due to improved activity tolerance, improved balance, increased strength and ability to compensate for deficits.  Patient to discharge at an ambulatory level Supervision.   Patient's care partner verbalized understanding to provide the necessary physical assistance at discharge.  Reasons goals not met: Goals met, but daughter unavailable for in person caregiver training due to family member's illness so she was educated via video phone and verbalized good understanding.  Recommendation:  Patient will benefit from ongoing skilled PT services in outpatient setting, but pt may decline due to financial and proximity issues to continue to advance safe functional mobility, address ongoing impairments in balance, endurance and dizziness, and minimize fall risk.  Equipment: youth RW  Reasons for discharge: treatment goals met and discharge from hospital  Patient/family agrees with progress made and goals achieved: Yes  PT Discharge Precautions/Restrictions Precautions Precautions: Fall Restrictions Other Position/Activity Restrictions: ROM to c/spine as tolerated Vital Signs   Pain Pain Assessment Pain Score: 0-No pain Vision/Perception  Vision - Assessment Eye Alignment: Within Functional Limits Perception Perception: Within Functional Limits Praxis Praxis: Intact  Cognition Overall Cognitive Status: Within Functional Limits for tasks assessed Arousal/Alertness: Awake/alert Orientation Level: Oriented X4 Awareness: Appears intact Problem Solving: Appears intact Safety/Judgment: Appears intact Sensation Sensation Light Touch: Appears Intact Proprioception: Appears Intact Stereognosis: Appears Intact Coordination Gross Motor Movements are Fluid and Coordinated: Yes Fine Motor Movements are Fluid and  Coordinated: Yes Motor  Motor Motor: Within Functional Limits Motor - Discharge Observations: improving confidence with movement, but slower when fatigued  Mobility Bed Mobility Bed Mobility: Rolling Right;Supine to Sit;Sit to Supine Rolling Right: Independent Supine to Sit: Independent Sit to Supine: Independent Transfers Transfers: Sit to Stand;Stand to Sit;Stand Pivot Transfers Sit to Stand: Supervision/Verbal cueing Stand to Sit: Supervision/Verbal cueing Stand Pivot Transfers: Supervision/Verbal cueing Transfer (Assistive device): Rolling walker Locomotion  Gait Ambulation: Yes Gait Assistance: Supervision/Verbal cueing Gait Distance (Feet): 150 Feet Assistive device: Rolling walker Gait Gait Pattern: Impaired Gait Pattern: Step-through pattern;Decreased stride length;Shuffle Stairs / Additional Locomotion Stairs: Yes Stairs Assistance: Contact Guard/Touching assist Stair Management Technique: One rail Right Number of Stairs: 12 Height of Stairs: 6 Wheelchair Mobility Wheelchair Mobility: No  Trunk/Postural Assessment  Cervical Assessment Cervical Assessment: Within Functional Limits Thoracic Assessment Thoracic Assessment: Within Functional Limits Lumbar Assessment Lumbar Assessment: Within Functional Limits Postural Control Postural Control: Within Functional Limits  Balance Balance Balance Assessed: Yes Standardized Balance Assessment Standardized Balance Assessment: Berg Balance Test Berg Balance Test Sit to Stand: Able to stand  independently using hands Standing Unsupported: Able to stand safely 2 minutes Sitting with Back Unsupported but Feet Supported on Floor or Stool: Able to sit safely and securely 2 minutes Stand to Sit: Sits safely with minimal use of hands Transfers: Able to transfer safely, definite need of hands Standing Unsupported with Eyes Closed: Able to stand 10 seconds safely Standing Ubsupported with Feet Together: Able to place feet  together independently and stand for 1 minute with supervision From Standing, Reach Forward with Outstretched Arm: Can reach confidently >25 cm (10") From Standing Position, Pick up Object from Floor: Able to pick up shoe, needs supervision From Standing Position, Turn to Look Behind Over each Shoulder: Looks behind from both sides and weight shifts well Turn 360 Degrees: Able to turn 360 degrees safely but slowly Standing Unsupported, Alternately Place Feet on Step/Stool: Able  to complete 4 steps without aid or supervision Standing Unsupported, One Foot in Front: Able to plae foot ahead of the other independently and hold 30 seconds Standing on One Leg: Tries to lift leg/unable to hold 3 seconds but remains standing independently Total Score: 44 Static Sitting Balance Static Sitting - Balance Support: No upper extremity supported;Feet supported Static Sitting - Level of Assistance: 6: Modified independent (Device/Increase time) Dynamic Sitting Balance Dynamic Sitting - Balance Support: No upper extremity supported;Feet supported;During functional activity Dynamic Sitting - Level of Assistance: 5: Stand by assistance Dynamic Sitting - Balance Activities: Lateral lean/weight shifting;Forward lean/weight shifting Static Standing Balance Static Standing - Balance Support: No upper extremity supported;During functional activity Static Standing - Level of Assistance: 5: Stand by assistance Dynamic Standing Balance Dynamic Standing - Balance Support: No upper extremity supported;During functional activity Dynamic Standing - Level of Assistance: 5: Stand by assistance Dynamic Standing - Comments: washing hands at sink Extremity Assessment      RLE Assessment RLE Assessment: Within Functional Limits LLE Assessment LLE Assessment: Within Functional Limits    Reginia Naas  Magda Kiel, PT 02/16/2021, 10:36 AM

## 2021-02-16 NOTE — Progress Notes (Signed)
Occupational Therapy Discharge Summary  Patient Details  Name: Teresa [name] MRN: [id number] Date of Birth: 1951/10/02     Patient has met 9 of 9 long term goals due to improved activity tolerance, improved balance, postural control, improved awareness and improved coordination.  Patient to discharge at overall Supervision level.  Patient's care partner is independent to provide the necessary physical assistance at discharge.    Reasons goals not met: na  Recommendation:  OT goals met at this time, no further needs at discharge.   Equipment: commode, shower seat with back  Reasons for discharge: treatment goals met  Patient/family agrees with progress made and goals achieved: Yes  OT Discharge Precautions/Restrictions  Precautions Precautions: Fall Restrictions Other Position/Activity Restrictions: ROM to c/spine as tolerated Pain Pain Assessment Pain Scale: 0-10 Pain Score: 0-No pain ADL ADL Eating: Independent Grooming: Modified independent,Supervision/safety Where Assessed-Grooming: Standing at sink,Sitting at sink Upper Body Bathing: Setup Where Assessed-Upper Body Bathing: Shower Lower Body Bathing: Supervision/safety Where Assessed-Lower Body Bathing: Shower Upper Body Dressing: Setup Where Assessed-Upper Body Dressing: Chair Lower Body Dressing: Setup,Supervision/safety Where Assessed-Lower Body Dressing: Chair Toileting: Supervision/safety Where Assessed-Toileting: Toilet,Bedside Commode Toilet Transfer: Distant supervision Toilet Transfer Method: Counselling psychologist: Grab bars,Raised toilet seat,Bedside commode Tub/Shower Transfer: Close supervison Clinical cytogeneticist Method: Optometrist: Civil engineer, contracting with back Social research officer, government: Close supervision Social research officer, government Method: Heritage manager: Civil engineer, contracting with back ADL Comments: recommend supervision for activities in stance - mod  I/set up when seated Vision Baseline Vision/History: Wears glasses Wears Glasses: Reading only Patient Visual Report: No change from baseline Vision Assessment?: Yes Eye Alignment: Within Functional Limits Ocular Range of Motion: Within Functional Limits Alignment/Gaze Preference: Within Defined Limits Tracking/Visual Pursuits: Able to track stimulus in all quads without difficulty Saccades: Within functional limits Convergence: Within functional limits Perception  Perception: Within Functional Limits Praxis Praxis: Intact Cognition Overall Cognitive Status: Within Functional Limits for tasks assessed Arousal/Alertness: Awake/alert Orientation Level: Oriented X4 Awareness: Appears intact Problem Solving: Appears intact Safety/Judgment: Appears intact Sensation Sensation Light Touch: Appears Intact Coordination Fine Motor Movements are Fluid and Coordinated: Yes 9 Hole Peg Test: R = 22 sec, L = 27 sec Motor  Motor Motor: Within Functional Limits Motor - Discharge Observations: improving confidence with movement, but slower when fatigued Mobility  Bed Mobility Bed Mobility: Rolling Right;Supine to Sit;Sit to Supine Rolling Right: Independent Supine to Sit: Independent Sit to Supine: Independent Transfers Sit to Stand: Supervision/Verbal cueing Stand to Sit: Supervision/Verbal cueing  Trunk/Postural Assessment  Cervical Assessment Cervical Assessment: Within Functional Limits Thoracic Assessment Thoracic Assessment: Within Functional Limits Lumbar Assessment Lumbar Assessment: Within Functional Limits Postural Control Postural Control: Within Functional Limits  Balance Static Sitting Balance Static Sitting - Level of Assistance: 6: Modified independent (Device/Increase time) Dynamic Sitting Balance Dynamic Sitting - Level of Assistance: 5: Stand by assistance Static Standing Balance Static Standing - Level of Assistance: 5: Stand by assistance Dynamic Standing  Balance Dynamic Standing - Level of Assistance: 5: Stand by assistance Extremity/Trunk Assessment RUE Assessment RUE Assessment: Within Functional Limits LUE Assessment LUE Assessment: Within Functional Limits   Carlos Levering 02/16/2021, 3:57 PM

## 2021-02-16 NOTE — Progress Notes (Signed)
PROGRESS NOTE   Subjective/Complaints: Patient seen laying in bed this morning.  She states she slept well overnight.  She is aware and looking forward to discharge tomorrow.  ROS: Denies CP, SOB, N/V/D  Objective:   No results found. No results for input(s): WBC, HGB, HCT, PLT in the last 72 hours. No results for input(s): NA, K, CL, CO2, GLUCOSE, BUN, CREATININE, CALCIUM in the last 72 hours.  Intake/Output Summary (Last 24 hours) at 02/16/2021 0853 Last data filed at 02/16/2021 0813 Gross per 24 hour  Intake 600 ml  Output --  Net 600 ml        Physical Exam: Vital Signs Blood pressure 111/77, pulse 62, temperature 97.6 F (36.4 C), temperature source Oral, resp. rate 18, height 5\' 1"  (1.549 m), weight 68 kg, SpO2 96 %. Constitutional: No distress . Vital signs reviewed. HENT: Normocephalic.  Atraumatic. Eyes: EOMI. No discharge. Cardiovascular: No JVD.  RRR. Respiratory: Normal effort.  No stridor.  Bilateral clear to auscultation. GI: Non-distended.  BS +. Skin: Warm and dry.  Intact. Psych: Normal mood.  Normal behavior. Musc: No edema in extremities.  No tenderness in extremities. Neuro: Alert Follows simple commands.   Motor: 5/5 throughout, unchanged  Assessment/Plan: 1. Functional deficits which require 3+ hours per day of interdisciplinary therapy in a comprehensive inpatient rehab setting.  Physiatrist is providing close team supervision and 24 hour management of active medical problems listed below.  Physiatrist and rehab team continue to assess barriers to discharge/monitor patient progress toward functional and medical goals  Care Tool:  Bathing    Body parts bathed by patient: Right arm,Left arm,Chest,Abdomen,Front perineal area,Buttocks,Right upper leg,Left upper leg,Right lower leg,Left lower leg,Face         Bathing assist Assist Level: Supervision/Verbal cueing     Upper Body  Dressing/Undressing Upper body dressing   What is the patient wearing?: Button up shirt    Upper body assist Assist Level: Supervision/Verbal cueing    Lower Body Dressing/Undressing Lower body dressing      What is the patient wearing?: Hospital gown only     Lower body assist Assist for lower body dressing: Independent     Toileting Toileting    Toileting assist Assist for toileting: Supervision/Verbal cueing     Transfers Chair/bed transfer  Transfers assist     Chair/bed transfer assist level: Supervision/Verbal cueing     Locomotion Ambulation   Ambulation assist      Assist level: Supervision/Verbal cueing Assistive device: Walker-rolling Max distance: 140'   Walk 10 feet activity   Assist     Assist level: Supervision/Verbal cueing Assistive device: Walker-rolling   Walk 50 feet activity   Assist    Assist level: Supervision/Verbal cueing Assistive device: Walker-rolling    Walk 150 feet activity   Assist Walk 150 feet activity did not occur: Safety/medical concerns         Walk 10 feet on uneven surface  activity   Assist Walk 10 feet on uneven surfaces activity did not occur: Safety/medical concerns         Wheelchair     Assist Will patient use wheelchair at discharge?: No  Wheelchair 50 feet with 2 turns activity    Assist            Wheelchair 150 feet activity     Assist          Medical Problem List and Plan: 1.  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  Status post left suboccipital craniectomy gross total resection on 02/06/2021.    Continue CIR, patient and family education  Continue to wean Decadron 2.  Impaired mobility -DVT/anticoagulation: Continue SCDs             -antiplatelet therapy: N/A 3. Postoperative pain: Decrease Norco to 1 tab q8H prn.  Controlled on 4/21 4. Mood: Provide  emotional support             -antipsychotic agents: N/A 5. Neuropsych: This patient is?  Fully capable of making decisions on her own behalf. 6. Skin/Wound Care: Routine skin checks 7. Fluids/Electrolytes/Nutrition: Routine in and outs. 8.  Constipation.  Colace 100 mg twice daily, MiraLAX as needed. Added senna 1 tabs HS.    Improving 9.  Tobacco use.  Counseling 10. Overweight BMI 28.72-->28.34: provided significant education regarding healthy diet.  11. Impaired memory: continue SLP.  12. S/p left subocciptal craniectomy: on decadron 4mg  q8H.  13. Dizziness: meclizine 12.5mg  BID PRN added  Improved 14.  Transaminitis  ALT elevated on 4/18, labs ordered 15.  Steroid-induced hyperglycemia  Elevated on 4/18, labs ordered  Monitor with steroid wean  LOS: 6 days A FACE TO FACE EVALUATION WAS PERFORMED  Teresa [name] Lorie Phenix 02/16/2021, 8:53 AM

## 2021-02-16 NOTE — Discharge Instructions (Signed)
Inpatient Rehab Discharge Instructions  Monika Chestang Discharge date and time: No discharge date for patient encounter.   Activities/Precautions/ Functional Status: Activity: As tolerated Diet: Regular Wound Care: Routine skin checks Functional status:  ___ No restrictions     ___ Walk up steps independently ___ 24/7 supervision/assistance   ___ Walk up steps with assistance ___ Intermittent supervision/assistance  ___ Bathe/dress independently ___ Walk with walker     _x__ Bathe/dress with assistance ___ Walk Independently    ___ Shower independently ___ Walk with assistance    ___ Shower with assistance ___ No alcohol     ___ Return to work/school ________  Special Instructions: No driving smoking or alcohol    COMMUNITY REFERRALS UPON DISCHARGE:   HOME EXERCISE PROGRAM NO HOME HEALTH AGENCY WILL ACCEPT FOR CHARITY  Medical Equipment/Items Ordered:YOUTH ROLLING WALKER, 3 IN 1 AND TUB SEAT                                                 Agency/Supplier:[facility]  [place]   My questions have been answered and I understand these instructions. I will adhere to these goals and the provided educational materials after my discharge from the hospital.  Patient/Caregiver Signature _______________________________ Date __________  Clinician Signature _______________________________________ Date __________  Please bring this form and your medication list with you to all your follow-up doctor's appointments.

## 2021-02-17 NOTE — Progress Notes (Signed)
Patient discharged with daughter at 17 am today. Information packet given to patient per PA, no questions noted. Taken down via wheelchair. Angie Fava

## 2021-02-20 ENCOUNTER — Telehealth: Payer: Self-pay | Admitting: Oncology

## 2021-02-20 ENCOUNTER — Telehealth: Payer: Self-pay

## 2021-02-20 NOTE — Telephone Encounter (Signed)
Transferred daughter to [employer]

## 2021-02-20 NOTE — Telephone Encounter (Signed)
Patient's has questions regarding next Appt's.  She is currently recovering from surgery

## 2021-02-20 NOTE — Telephone Encounter (Addendum)
Dr Bobby Rumpf states pt can just keep the appt for 02/27/21, as scheduled.  I called pt's daughter back and notified her of that.    Pt's dtr called to ask when her mom would get radiation. "Dr Bobby Rumpf told us she would have radiation after surgery". Her mom had the surgery on brain 2 weeks ago.

## 2021-02-21 ENCOUNTER — Telehealth: Payer: Self-pay | Admitting: Registered Nurse

## 2021-02-21 DIAGNOSIS — Z6829 Body mass index (BMI) 29.0-29.9, adult: Secondary | ICD-10-CM | POA: Insufficient documentation

## 2021-02-21 NOTE — Telephone Encounter (Signed)
Transitional Care call Transitional Questions Answered by Daughter: Verdis Frederickson  Patient name: Teresa [name]  DOB: 10-26-1951 1. Are you/is patient experiencing any problems since coming home? No a. Are there any questions regarding any aspect of care? No 2. Are there any questions regarding medications administration/dosing? No a. Are meds being taken as prescribed? Yes b. "Patient should review meds with caller to confirm" Medication List Reviewed. 3. Have there been any falls? No 4. Has Home Health been to the house and/or have they contacted you? No No Home Health Agency would Take for [facility] per Randel Books LCSW. a. If not, have you tried to contact them? NA b. Can we help you contact them? NA 5. Are bowels and bladder emptying properly? Yes a. Are there any unexpected incontinence issues? No b. If applicable, is patient following bowel/bladder programs? NA 6. Any fevers, problems with breathing, unexpected pain? No 7. Are there any skin problems or new areas of breakdown? No 8. Has the patient/family member arranged specialty MD follow up (ie cardiology/neurology/renal/surgical/etc.)?  Yes 9. Can we help arrange? NO Does the patient need any other services or support that we can help arrange? No 10. Are caregivers following through as expected in assisting the patient? Yes 11. Has the patient quit smoking, drinking alcohol, or using drugs as recommended? (                        )  Appointment date/time 02/23/2021  arrival time 8:40 for 9:00 appointment with Dr Posey Pronto. At [place]

## 2021-02-23 ENCOUNTER — Encounter: Payer: Self-pay | Admitting: Physical Medicine & Rehabilitation

## 2021-02-23 ENCOUNTER — Other Ambulatory Visit: Payer: Self-pay

## 2021-02-23 ENCOUNTER — Encounter: Payer: Self-pay | Attending: Physical Medicine & Rehabilitation | Admitting: Physical Medicine & Rehabilitation

## 2021-02-23 VITALS — BP 109/71 | HR 78 | Temp 98.2°F | Ht 61.0 in | Wt 156.0 lb

## 2021-02-23 DIAGNOSIS — R269 Unspecified abnormalities of gait and mobility: Secondary | ICD-10-CM | POA: Insufficient documentation

## 2021-02-23 DIAGNOSIS — R42 Dizziness and giddiness: Secondary | ICD-10-CM | POA: Insufficient documentation

## 2021-02-23 DIAGNOSIS — D496 Neoplasm of unspecified behavior of brain: Secondary | ICD-10-CM | POA: Insufficient documentation

## 2021-02-23 DIAGNOSIS — G8918 Other acute postprocedural pain: Secondary | ICD-10-CM | POA: Insufficient documentation

## 2021-02-23 NOTE — Progress Notes (Signed)
Subjective:    Patient ID: Teresa [name], female    DOB: 1951-06-21, 70 y.o.   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  Admit date: 02/10/2021 Discharge date: 02/17/2021  Interpretor present.  Daughter supplements history. At discharge she was instructed to follow up with Oncology, with whom she has an appointment on Monday. She saw Neurosurg. Staples were removed.  She has not seen PCP. She finished her steroids. Pain is intermittent.  Bowel movements are regular. She is not smoking. Dizziness when she makes fast movements. Denies falls.   Therapies: Pt states HEP, daughter disagrees - states she has been trying to encourage patients. Limited due to insurance.  DME: Shower chair Mobility: Walker at all times  Pain Inventory Average Pain 0 Pain Right Now 0 My pain is no pain  In the last 24 hours, has pain interfered with the following? General activity 0 Relation with others 0 Enjoyment of life 0 What TIME of day is your pain at its worst? no pain Sleep (in general) Good  Pain is worse with: no pain Pain improves with: no pain Relief from Meds: no pain  walk with assistance use a walker  not employed: date last employed .  weakness trouble walking dizziness  transitonal care  transitional care    History reviewed. No pertinent family history. Social History   Socioeconomic History  . Marital status: Divorced    Spouse name: Not on file  . Number of children: Not on file  . Years of education: Not on file  . Highest education level: Not on file  Occupational History  . Not on file  Tobacco Use  . Smoking  status: Former Smoker    Types: Cigarettes  . Smokeless tobacco: Never Used  . Tobacco comment: quit befjore 2015  Vaping Use  . Vaping Use: Never used  Substance and Sexual Activity  . Alcohol use: Not Currently  . Drug use: Never  . Sexual activity: Not on file  Other Topics Concern  . Not on file  Social History Narrative  . Not on file   Social Determinants of Health   Financial Resource Strain: Not on file  Food Insecurity: Not on file  Transportation Needs: Not on file  Physical Activity: Not on file  Stress: Not on file  Social Connections: Not on file   Past Surgical History:  Procedure Laterality Date  . APPLICATION OF CRANIAL NAVIGATION N/A 02/06/2021   Procedure: APPLICATION OF CRANIAL NAVIGATION;  Surgeon: Newman Pies, MD;  Location: [facility];  Service: Neurosurgery;  Laterality: N/A;  . BREAST LUMPECTOMY Left 2015  . BREAST SURGERY Left    tumor removal  . CRANIOTOMY N/A 02/06/2021   Procedure: Suboccipital Craniectomy for Tumor Resection with Brainlab Navigation;  Surgeon: Newman Pies, MD;  Location: [facility];  Service: Neurosurgery;  Laterality: N/A;  . TUBAL LIGATION     Past Medical History:  Diagnosis Date  . Arthritis   . Lung cancer ([facility]) 2015   BP 109/71   Pulse 78   Temp 98.2 F (36.8 C)   Ht 5\' 1"  (1.549 m)   Wt 156 lb (70.8 kg)   SpO2 98%  BMI 29.48 kg/m   Opioid Risk Score:   Fall Risk Score:  `1  Depression screen PHQ 2/9  No flowsheet data found.  Review of Systems  Constitutional: Negative.   HENT: Negative.   Eyes: Negative.   Respiratory: Negative.   Cardiovascular: Negative.   Gastrointestinal: Negative.   Endocrine: Negative.   Genitourinary: Negative.   Musculoskeletal: Positive for gait problem.  Skin: Negative.   Allergic/Immunologic: Negative.   Neurological: Positive for dizziness and weakness.  Hematological: Negative.   Psychiatric/Behavioral: Negative.       Objective:   Physical Exam   Constitutional: No distress . Vital signs reviewed. HENT: Normocephalic.  Atraumatic. Eyes: EOMI. No discharge. Cardiovascular: No JVD.   Respiratory: Normal effort.  No stridor.   GI: Non-distended.   Skin: Warm and dry.  Intact. Psych: Normal mood.  Normal behavior. Musc: No edema in extremities.  No tenderness in extremities. Neuro: Alert Follows simple commands.   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  1.  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  Status post left suboccipital craniectomy gross total resection on 02/06/2021.               Encouraged HEP  Cont follow up with Neurosurg  Follow up with Onc  2.  Impaired mobility  Cont walker for safety  Encouraged HEP  3. Postoperative pain:   Intermittent, controlled with OTC  4. Dizziness:   Meclizine 12.5mg  BID PRN   Encouraged slower, calculated movements  Meds reviewed Referrals reviewed All questions answered

## 2021-02-27 ENCOUNTER — Inpatient Hospital Stay: Payer: Self-pay | Attending: Oncology

## 2021-02-27 ENCOUNTER — Other Ambulatory Visit: Payer: Self-pay

## 2021-02-27 ENCOUNTER — Encounter: Payer: Self-pay | Admitting: Oncology

## 2021-02-27 ENCOUNTER — Inpatient Hospital Stay (HOSPITAL_BASED_OUTPATIENT_CLINIC_OR_DEPARTMENT_OTHER): Payer: Self-pay | Admitting: Oncology

## 2021-02-27 ENCOUNTER — Telehealth: Payer: Self-pay | Admitting: Oncology

## 2021-02-27 ENCOUNTER — Other Ambulatory Visit: Payer: Self-pay | Admitting: Oncology

## 2021-02-27 ENCOUNTER — Other Ambulatory Visit: Payer: Self-pay | Admitting: Hematology and Oncology

## 2021-02-27 VITALS — BP 114/72 | HR 96 | Temp 97.9°F | Resp 18 | Ht 61.0 in | Wt 153.8 lb

## 2021-02-27 DIAGNOSIS — Z79899 Other long term (current) drug therapy: Secondary | ICD-10-CM | POA: Insufficient documentation

## 2021-02-27 DIAGNOSIS — C50412 Malignant neoplasm of upper-outer quadrant of left female breast: Secondary | ICD-10-CM

## 2021-02-27 DIAGNOSIS — C7931 Secondary malignant neoplasm of brain: Secondary | ICD-10-CM | POA: Insufficient documentation

## 2021-02-27 DIAGNOSIS — Z5112 Encounter for antineoplastic immunotherapy: Secondary | ICD-10-CM | POA: Insufficient documentation

## 2021-02-27 DIAGNOSIS — C50919 Malignant neoplasm of unspecified site of unspecified female breast: Secondary | ICD-10-CM | POA: Insufficient documentation

## 2021-02-27 DIAGNOSIS — C7801 Secondary malignant neoplasm of right lung: Secondary | ICD-10-CM | POA: Insufficient documentation

## 2021-02-27 DIAGNOSIS — C50912 Malignant neoplasm of unspecified site of left female breast: Secondary | ICD-10-CM

## 2021-02-27 DIAGNOSIS — C7802 Secondary malignant neoplasm of left lung: Secondary | ICD-10-CM | POA: Insufficient documentation

## 2021-02-27 DIAGNOSIS — Z171 Estrogen receptor negative status [ER-]: Secondary | ICD-10-CM

## 2021-02-27 LAB — HEPATIC FUNCTION PANEL
ALT: 262 — AB (ref 7–35)
AST: 98 — AB (ref 13–35)
Alkaline Phosphatase: 130 — AB (ref 25–125)
Bilirubin, Total: 0.4

## 2021-02-27 LAB — CBC AND DIFFERENTIAL
HCT: 39 (ref 36–46)
Hemoglobin: 12.8 (ref 12.0–16.0)
Neutrophils Absolute: 4.31
Platelets: 174 (ref 150–399)
WBC: 5.9

## 2021-02-27 LAB — CBC
MCV: 93 (ref 81–99)
RBC: 4.12 (ref 3.87–5.11)

## 2021-02-27 LAB — BASIC METABOLIC PANEL
BUN: 17 (ref 4–21)
CO2: 23 — AB (ref 13–22)
Chloride: 106 (ref 99–108)
Creatinine: 0.6 (ref 0.5–1.1)
Glucose: 122
Potassium: 3.8 (ref 3.4–5.3)
Sodium: 137 (ref 137–147)

## 2021-02-27 LAB — COMPREHENSIVE METABOLIC PANEL
Albumin: 4.1 (ref 3.5–5.0)
Calcium: 9.6 (ref 8.7–10.7)

## 2021-02-27 NOTE — Telephone Encounter (Signed)
Per 5/2 LOS, patient scheduled for 5/10 Follow Up - will order PET tomorrow for 5/9.  Gave daughter Appt Summary for Follow Up

## 2021-02-27 NOTE — Progress Notes (Signed)
[facility]  [address] [place],  [place]  [address] [phone]  Clinic Day:  02/27/2021  Referring physician: Marguerita Merles, MD   HISTORY OF PRESENT ILLNESS:  The patient is a 70 y.o. 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  The pathology was consistent with Her2 positive breast cancer.  She had previous spread of disease to her lungs for which she received 24 cycles of TDM-1 in late August 2021.  She took multiple months off of any form of therapy before her metastatic CNS disease returned.   An MRI afterwards showed no residual disease.  She comes in today to determine what to do next.  Since having her brain surgery, she has been undergoing physical therapy.  Her daughter claims she has not been as compliant in doing her daily exercises.  As it pertains to her metastatic CNS disease, she denies having any lingering neurological changes.  She denies having symptoms elsewhere which concern her for other areas of metastatic disease.     PHYSICAL EXAM:  Blood pressure 114/72, pulse 96, temperature 97.9 F (36.6 C), temperature source Oral, resp. rate 18, height _0  (1.549 m), weight 153 lb 12.8 oz (69.8 kg), SpO2 96 %. Wt Readings from Last 3 Encounters:  02/27/21 153 lb 12.8 oz (69.8 kg)  02/23/21 156 lb (70.8 kg)  02/10/21 150 lb (68 kg)   Body mass index is 29.06 kg/m. Performance status (ECOG): 1 - Symptomatic but completely ambulatory Physical Exam Constitutional:      General: She is not in acute distress.    Appearance: She is normal weight. She is not ill-appearing, toxic-appearing or diaphoretic.     Comments: She is ambulating with a walker.  She has a moderately disheveled appearance.    HENT:     Head: Normocephalic and atraumatic.     Right Ear: Tympanic membrane normal.     Left Ear: Tympanic membrane normal.     Nose:  Nose normal. No congestion or rhinorrhea.     Mouth/Throat:     Mouth: Mucous membranes are moist.     Pharynx: Oropharynx is clear. No oropharyngeal exudate or posterior oropharyngeal erythema.  Eyes:     General: No scleral icterus.       Right eye: No discharge.        Left eye: No discharge.     Extraocular Movements: Extraocular movements intact.     Conjunctiva/sclera: Conjunctivae normal.     Pupils: Pupils are equal, round, and reactive to light.  Neck:     Vascular: No carotid bruit.  Cardiovascular:     Rate and Rhythm: Normal rate and regular rhythm.     Heart sounds: No murmur heard. No friction rub. No gallop.   Pulmonary:     Effort: Pulmonary effort is normal. No respiratory distress.     Breath sounds: Normal breath sounds. No stridor. No wheezing, rhonchi or rales.  Chest:     Chest wall: No tenderness.  Abdominal:     General: Abdomen is flat. Bowel sounds are normal. There is no distension.     Palpations: There is no mass.     Tenderness: There is no abdominal tenderness. There is no right CVA tenderness, left CVA tenderness, guarding or rebound.     Hernia: No hernia is present.  Musculoskeletal:        General: No  swelling, tenderness, deformity or signs of injury. Normal range of motion.     Cervical back: Normal range of motion and neck supple. No rigidity or tenderness.     Right lower leg: No edema.     Left lower leg: No edema.  Lymphadenopathy:     Cervical: No cervical adenopathy.  Skin:    General: Skin is warm and dry.     Capillary Refill: Capillary refill takes less than 2 seconds.     Coloration: Skin is not jaundiced or pale.     Findings: No bruising, erythema, lesion or rash.  Neurological:     General: No focal deficit present.     Mental Status: She is alert and oriented to person, place, and time. Mental status is at baseline.     Cranial Nerves: No cranial nerve deficit.     Sensory: No sensory deficit.     Motor: No weakness.      Coordination: Coordination normal.     Gait: Gait normal.     Deep Tendon Reflexes: Reflexes normal.  Psychiatric:        Mood and Affect: Mood normal.        Behavior: Behavior normal.        Thought Content: Thought content normal.        Judgment: Judgment normal.   LABS:      ASSESSMENT & PLAN:  Assessment/Plan:  A 70 y.o. 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  I do believe this patient warrants scans to ensure she has no other occult sites of metastatic disease.  I am particularly concerned about this as her liver enzymes are much higher than what they have been previously.  I will arrange for her to undergo a PET scan next week; I will see her back the following day to go over the results and their implications.  The patient understands all the plans discussed today and is in agreement with them.    Beverley Allender Macarthur Critchley, MD

## 2021-03-03 ENCOUNTER — Telehealth: Payer: Self-pay | Admitting: Oncology

## 2021-03-03 NOTE — Telephone Encounter (Signed)
Patient's daughter called to get patient's next Appt.  Patient scheduled for 5/23 PET Scan at 8:15 am - Follow Up rescheduled to 5/24 at 4:30 pm.  Mailing Appt Summary/Orders/Instructions

## 2021-03-07 ENCOUNTER — Inpatient Hospital Stay: Payer: Self-pay | Admitting: Oncology

## 2021-03-07 NOTE — Progress Notes (Signed)
[facility]  [address] [place],  [place]  [address] [phone]  Clinic Day:  03/09/2021  Referring physician: Marguerita Merles, MD  This document serves as a record of services personally performed by [name], MD. It was created on their behalf by [facility], a trained medical scribe. The creation of this record is based on the scribe's personal observations and the provider's statements to them.  HISTORY OF PRESENT ILLNESS:  The patient is a 70 y.o. 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  The pathology was consistent with Her2 positive breast cancer.  She had previous spread of disease to her  right supraclavicular and mediastinal lymph nodes for which she received 24 cycles of TDM-1, with the last cycle given in late August 2021.  She took multiple months off of any form of therapy before her metastatic CNS disease returned.  She comes in today to go over her PET scan to determine if she has other areas of occult metastasis.  Since her last visit, the patient has been doing well.  As it pertains to her metastatic CNS disease, she denies having any lingering neurological changes.  She denies having symptoms elsewhere which concern her for other areas of metastatic disease.     PHYSICAL EXAM:  Blood pressure 120/86, pulse 88, temperature 98 F (36.7 C), resp. rate 14, height 5' 1"  (1.549 m), weight 155 lb 6.4 oz (70.5 kg), SpO2 94 %. Wt Readings from Last 3 Encounters:  03/09/21 155 lb 6.4 oz (70.5 kg)  02/27/21 153 lb 12.8 oz (69.8 kg)  02/23/21 156 lb (70.8 kg)   Body mass index is 29.36 kg/m. Performance status (ECOG): 1 - Symptomatic but completely ambulatory Physical Exam Constitutional:      General: She is not in acute distress.    Appearance: She is normal weight. She is not ill-appearing, toxic-appearing or diaphoretic.      Comments: She is ambulating with a walker.  She has a moderately disheveled appearance.    HENT:     Head: Normocephalic and atraumatic.     Right Ear: Tympanic membrane normal.     Left Ear: Tympanic membrane normal.     Nose: Nose normal. No congestion or rhinorrhea.     Mouth/Throat:     Mouth: Mucous membranes are moist.     Pharynx: Oropharynx is clear. No oropharyngeal exudate or posterior oropharyngeal erythema.  Eyes:     General: No scleral icterus.       Right eye: No discharge.        Left eye: No discharge.     Extraocular Movements: Extraocular movements intact.     Conjunctiva/sclera: Conjunctivae normal.     Pupils: Pupils are equal, round, and reactive to light.  Neck:     Vascular: No carotid bruit.  Cardiovascular:     Rate and Rhythm: Normal rate and regular rhythm.     Heart sounds: No murmur heard. No friction rub. No gallop.   Pulmonary:     Effort: Pulmonary effort is normal. No respiratory distress.     Breath sounds: Normal breath sounds. No stridor. No wheezing, rhonchi or rales.  Chest:     Chest wall: No tenderness.  Abdominal:     General: Abdomen is flat. Bowel sounds are normal. There is no distension.     Palpations: There is no mass.  Tenderness: There is no abdominal tenderness. There is no right CVA tenderness, left CVA tenderness, guarding or rebound.     Hernia: No hernia is present.  Musculoskeletal:        General: No swelling, tenderness, deformity or signs of injury. Normal range of motion.     Cervical back: Normal range of motion and neck supple. No rigidity or tenderness.     Right lower leg: No edema.     Left lower leg: No edema.  Lymphadenopathy:     Cervical: No cervical adenopathy.  Skin:    General: Skin is warm and dry.     Capillary Refill: Capillary refill takes less than 2 seconds.     Coloration: Skin is not jaundiced or pale.     Findings: No bruising, erythema, lesion or rash.  Neurological:     General: No  focal deficit present.     Mental Status: She is alert and oriented to person, place, and time. Mental status is at baseline.     Cranial Nerves: No cranial nerve deficit.     Sensory: No sensory deficit.     Motor: No weakness.     Coordination: Coordination normal.     Gait: Gait normal.     Deep Tendon Reflexes: Reflexes normal.  Psychiatric:        Mood and Affect: Mood normal.        Behavior: Behavior normal.        Thought Content: Thought content normal.        Judgment: Judgment normal.   SCANS:  Her PET scan revealed the following: FINDINGS: Mediastinal blood pool activity: SUV max 2.1  Liver activity: SUV max NA  NECK: Craniotomy defect in the LEFT cerebellum with hypometabolism.  No evidence of metastatic adenopathy in the neck.  Incidental CT findings: none  CHEST: Hypermetabolic subcarinal lymph node measures 15 mm with SUV max equal 6.8. Node was enlarged on comparison CT of 11/09/2020.  No additional hypermetabolic mediastinal or supraclavicular lymph nodes.  No suspicious pulmonary nodules. Small nodule in the RIGHT middle lobe measuring 3 mm is unchanged (image 95/3).  Incidental CT findings: Port in the anterior chest wall with tip in distal SVC.  ABDOMEN/PELVIS: Benign hepatic cysts. Hypermetabolic lesions in the liver. Adrenal glands are normal. No hypermetabolic abdominopelvic lymph nodes.  Incidental CT findings: Moderate size hiatal hernia. Uterus and adnexa normal.  SKELETON: No focal hypermetabolic activity to suggest skeletal metastasis.  Incidental CT findings: none  IMPRESSION: 1. Hypermetabolic subcarinal lymph node consistent with metastatic mediastinal adenopathy. 2. 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 3. No evidence of metastatic disease elsewhere within the soft tissues or skeleton.  ASSESSMENT & PLAN:  Assessment/Plan:  A 70 y.o. female with metastatic her 2 Neu receptor positive breast  cancer, status post a suboccipital brain metastatecomy in April 2022.  In clinic today, I went over her PET scan images with her, for which she could see the only focus of disease she has is within a subcarinal lymph node.  Based upon this focus of persistent disease and having disease that had metastasized to the brain, I do believe she needs to be placed on some form of palliative Her2 therapy.  She will start receiving enhertu, a Her2 antibody-drug conjugate, on Friday, May 20th.  The patient was made aware of the side effects which can go along with such therapy, including lung toxicity, fatigue, and nausea.  Each cycle of enhertu will be repeated  every 3 weeks.  I will see her back 3 weeks later before she heads into her 2nd cycle of treatment.  The patient understands all the plans discussed today and is in agreement with them.     I, Rita Ohara, am acting as scribe for Marice Potter, MD    I have reviewed this report as typed by the medical scribe, and it is complete and accurate.  [name], MD

## 2021-03-09 ENCOUNTER — Inpatient Hospital Stay (HOSPITAL_BASED_OUTPATIENT_CLINIC_OR_DEPARTMENT_OTHER): Payer: Self-pay | Admitting: Oncology

## 2021-03-09 ENCOUNTER — Other Ambulatory Visit: Payer: Self-pay | Admitting: Oncology

## 2021-03-09 ENCOUNTER — Other Ambulatory Visit: Payer: Self-pay

## 2021-03-09 ENCOUNTER — Telehealth: Payer: Self-pay | Admitting: Oncology

## 2021-03-09 VITALS — BP 120/86 | HR 88 | Temp 98.0°F | Resp 14 | Ht 61.0 in | Wt 155.4 lb

## 2021-03-09 DIAGNOSIS — Z171 Estrogen receptor negative status [ER-]: Secondary | ICD-10-CM

## 2021-03-09 DIAGNOSIS — C50412 Malignant neoplasm of upper-outer quadrant of left female breast: Secondary | ICD-10-CM

## 2021-03-09 DIAGNOSIS — C50912 Malignant neoplasm of unspecified site of left female breast: Secondary | ICD-10-CM

## 2021-03-09 DIAGNOSIS — C7931 Secondary malignant neoplasm of brain: Secondary | ICD-10-CM

## 2021-03-09 NOTE — Telephone Encounter (Signed)
Per 5/12 LOS, patient scheduled for June Appt's.  Gave patient Appt Summary

## 2021-03-09 NOTE — Progress Notes (Signed)
Patient on plan of care prior to pathways. 

## 2021-03-09 NOTE — Progress Notes (Signed)
START ON PATHWAY REGIMEN - Breast     A cycle is every 21 days:     Fam-trastuzumab deruxtecan-nxki   **Always confirm dose/schedule in your pharmacy ordering system**  Patient Characteristics: Distant Metastases or Locoregional Recurrent Disease - Unresected or Locally Advanced Unresectable Disease Progressing after Neoadjuvant and Local Therapies, HER2 Positive, ER Negative/Unknown, Chemotherapy, Second Line Therapeutic Status: Distant Metastases ER Status: Negative (-) HER2 Status: Positive (+) PR Status: Negative (-) Line of Therapy: Second Line Intent of Therapy: Non-Curative / Palliative Intent, Discussed with Patient

## 2021-03-13 ENCOUNTER — Encounter: Payer: Self-pay | Admitting: Oncology

## 2021-03-13 DIAGNOSIS — C50412 Malignant neoplasm of upper-outer quadrant of left female breast: Secondary | ICD-10-CM

## 2021-03-13 DIAGNOSIS — T451X1D Poisoning by antineoplastic and immunosuppressive drugs, accidental (unintentional), subsequent encounter: Secondary | ICD-10-CM

## 2021-03-15 ENCOUNTER — Encounter: Payer: Self-pay | Admitting: Oncology

## 2021-03-16 ENCOUNTER — Other Ambulatory Visit: Payer: Self-pay | Admitting: Pharmacist

## 2021-03-16 ENCOUNTER — Other Ambulatory Visit: Payer: Self-pay | Admitting: Hematology and Oncology

## 2021-03-16 MED ORDER — ONDANSETRON HCL 4 MG PO TABS: 4.0000 mg | ORAL_TABLET | ORAL | 3 refills | Status: DC | PRN

## 2021-03-16 MED ORDER — PROCHLORPERAZINE MALEATE 10 MG PO TABS: 10.0000 mg | ORAL_TABLET | Freq: Four times a day (QID) | ORAL | 3 refills | Status: DC | PRN

## 2021-03-16 NOTE — Progress Notes (Signed)
The following Medication: Enhertu has been approved thru [employer] as Assistance Program. Enrollment period is 03/15/2021 to 03/14/2022.  Assistance ID: [id number]. Reason for Assistance: No Insurance First DOS: 03/20/2021

## 2021-03-16 NOTE — Progress Notes (Signed)
Helped patient enroll into the [employer] so we could help with cost of medication.

## 2021-03-16 NOTE — Progress Notes (Signed)
Sent in request for DOS 03/21/2021 to [employer].

## 2021-03-17 ENCOUNTER — Encounter: Payer: Self-pay | Admitting: Oncology

## 2021-03-17 ENCOUNTER — Ambulatory Visit: Payer: Self-pay

## 2021-03-20 ENCOUNTER — Other Ambulatory Visit: Payer: Self-pay | Admitting: Pharmacist

## 2021-03-20 NOTE — Progress Notes (Signed)
Ok to proceed with fam-trastuzumab despite elevated LFTs per Dr. Bobby Rumpf.  Package insert says no dose adjustment of fam trastuzumab is required in patients with mild (total bilirubin ?ULN and any AST >ULN or total bilirubin >1 to 1.5 times ULN and any AST) or moderate (total bilirubin >1.5 to 3 times ULN and any AST) hepatic impairment. In patients with moderate hepatic impairment, due to potentially increased exposure, closely monitor for increased toxicities related to the topoisomerase inhibitor.  We will monitor closely for toxicity.

## 2021-03-21 ENCOUNTER — Encounter: Payer: Self-pay | Admitting: Hematology and Oncology

## 2021-03-21 ENCOUNTER — Inpatient Hospital Stay: Payer: Self-pay

## 2021-03-21 ENCOUNTER — Other Ambulatory Visit: Payer: Self-pay

## 2021-03-21 ENCOUNTER — Ambulatory Visit: Payer: Self-pay | Admitting: Oncology

## 2021-03-21 VITALS — BP 135/79 | HR 68 | Temp 97.8°F | Resp 16 | Wt 152.1 lb

## 2021-03-21 DIAGNOSIS — C50412 Malignant neoplasm of upper-outer quadrant of left female breast: Secondary | ICD-10-CM

## 2021-03-21 LAB — BASIC METABOLIC PANEL
BUN: 10 (ref 4–21)
CO2: 27 — AB (ref 13–22)
Chloride: 105 (ref 99–108)
Creatinine: 0.6 (ref 0.5–1.1)
Glucose: 97
Potassium: 4 (ref 3.4–5.3)
Sodium: 138 (ref 137–147)

## 2021-03-21 LAB — CBC AND DIFFERENTIAL
HCT: 40 (ref 36–46)
Hemoglobin: 13.2 (ref 12.0–16.0)
Neutrophils Absolute: 2.67
Platelets: 218 (ref 150–399)
WBC: 4.6

## 2021-03-21 LAB — COMPREHENSIVE METABOLIC PANEL
Albumin: 4.3 (ref 3.5–5.0)
Calcium: 9.9 (ref 8.7–10.7)

## 2021-03-21 LAB — HEPATIC FUNCTION PANEL
ALT: 42 — AB (ref 7–35)
AST: 40 — AB (ref 13–35)
Alkaline Phosphatase: 119 (ref 25–125)
Bilirubin, Total: 0.4

## 2021-03-21 LAB — CBC: RBC: 4.32 (ref 3.87–5.11)

## 2021-03-21 MED ORDER — DEXAMETHASONE SODIUM PHOSPHATE 100 MG/10ML IJ SOLN
10.0000 mg | Freq: Once | INTRAMUSCULAR | Status: AC
  Administered 2021-03-21: 10 mg via INTRAVENOUS
  Filled 2021-03-21: qty 10

## 2021-03-21 MED ORDER — PALONOSETRON HCL INJECTION 0.25 MG/5ML
INTRAVENOUS | Status: AC
  Filled 2021-03-21: qty 5

## 2021-03-21 MED ORDER — FAM-TRASTUZUMAB DERUXTECAN-NXKI CHEMO 100 MG IV SOLR
5.4000 mg/kg | Freq: Once | INTRAVENOUS | Status: AC
  Administered 2021-03-21: 380 mg via INTRAVENOUS
  Filled 2021-03-21: qty 19

## 2021-03-21 MED ORDER — DIPHENHYDRAMINE HCL 25 MG PO CAPS
50.0000 mg | ORAL_CAPSULE | Freq: Once | ORAL | Status: AC
Start: 2021-03-21 — End: 2021-03-21
  Administered 2021-03-21: 50 mg via ORAL

## 2021-03-21 MED ORDER — DIPHENHYDRAMINE HCL 50 MG/ML IJ SOLN
INTRAMUSCULAR | Status: AC
  Filled 2021-03-21: qty 1

## 2021-03-21 MED ORDER — ACETAMINOPHEN 325 MG PO TABS
ORAL_TABLET | ORAL | Status: AC
  Filled 2021-03-21: qty 2

## 2021-03-21 MED ORDER — HEPARIN SOD (PORK) LOCK FLUSH 100 UNIT/ML IV SOLN
500.0000 [IU] | Freq: Once | INTRAVENOUS | Status: AC | PRN
  Administered 2021-03-21: 500 [IU]
  Filled 2021-03-21: qty 5

## 2021-03-21 MED ORDER — PALONOSETRON HCL INJECTION 0.25 MG/5ML
0.2500 mg | Freq: Once | INTRAVENOUS | Status: AC
  Administered 2021-03-21: 0.25 mg via INTRAVENOUS

## 2021-03-21 MED ORDER — ACETAMINOPHEN 325 MG PO TABS
650.0000 mg | ORAL_TABLET | Freq: Once | ORAL | Status: AC
  Administered 2021-03-21: 650 mg via ORAL

## 2021-03-21 MED ORDER — DEXTROSE 5 % IV SOLN
Freq: Once | INTRAVENOUS | Status: AC
  Filled 2021-03-21: qty 250

## 2021-03-21 MED ORDER — DIPHENHYDRAMINE HCL 25 MG PO CAPS
ORAL_CAPSULE | ORAL | Status: AC
  Filled 2021-03-21: qty 2

## 2021-03-21 MED ORDER — SODIUM CHLORIDE 0.9% FLUSH
10.0000 mL | INTRAVENOUS | Status: DC | PRN
  Administered 2021-03-21: 10 mL
  Filled 2021-03-21: qty 10

## 2021-03-21 NOTE — Patient Instructions (Signed)
[facility]  Discharge Instructions: Thank you for choosing [facility] to provide your oncology and hematology care.  If you have a lab appointment with the [facility], please go directly to the [facility] and check in at the registration area.   Wear comfortable clothing and clothing appropriate for easy access to any Portacath or PICC line.   We strive to give you quality time with your provider. You may need to reschedule your appointment if you arrive late (15 or more minutes).  Arriving late affects you and other patients whose appointments are after yours.  Also, if you miss three or more appointments without notifying the office, you may be dismissed from the clinic at the provider's discretion.      For prescription refill requests, have your pharmacy contact our office and allow 72 hours for refills to be completed.    Today you received the following chemotherapy and/or immunotherapy agents fam-trastuzumab deruxtecan     To help prevent nausea and vomiting after your treatment, we encourage you to take your nausea medication as directed.  BELOW ARE SYMPTOMS THAT SHOULD BE REPORTED IMMEDIATELY: . *FEVER GREATER THAN 100.4 F (38 C) OR HIGHER . *CHILLS OR SWEATING . *NAUSEA AND VOMITING THAT IS NOT CONTROLLED WITH YOUR NAUSEA MEDICATION . *UNUSUAL SHORTNESS OF BREATH . *UNUSUAL BRUISING OR BLEEDING . *URINARY PROBLEMS (pain or burning when urinating, or frequent urination) . *BOWEL PROBLEMS (unusual diarrhea, constipation, pain near the anus) . TENDERNESS IN MOUTH AND THROAT WITH OR WITHOUT PRESENCE OF ULCERS (sore throat, sores in mouth, or a toothache) . UNUSUAL RASH, SWELLING OR PAIN  . UNUSUAL VAGINAL DISCHARGE OR ITCHING   Items with * indicate a potential emergency and should be followed up as soon as possible or go to the Emergency Department if any problems should occur.  Please show the CHEMOTHERAPY ALERT CARD or  IMMUNOTHERAPY ALERT CARD at check-in to the Emergency Department and triage nurse.  Should you have questions after your visit or need to cancel or reschedule your appointment, please contact [facility]  Dept: [phone]  and follow the prompts.  Office hours are 8:00 a.m. to 4:30 p.m. Monday - Friday. Please note that voicemails left after 4:00 p.m. may not be returned until the following business day.  We are closed weekends and major holidays. You have access to a nurse at all times for urgent questions. Please call the main number to the clinic Dept: [phone] and follow the prompts.  For any non-urgent questions, you may also contact your provider using MyChart. We now offer e-Visits for anyone 42 and older to request care online for non-urgent symptoms. For details visit [email or web address].   Also download the MyChart app! Go to the app store, search "MyChart", open the app, select [facility], and log in with your MyChart username and password.  Due to Covid, a mask is required upon entering the hospital/clinic. If you do not have a mask, one will be given to you upon arrival. For doctor visits, patients may have 1 support person aged 61 or older with them. For treatment visits, patients cannot have anyone with them due to current Covid guidelines and our immunocompromised population.

## 2021-03-21 NOTE — Progress Notes (Signed)
1529:  Patent attorney for teaching today. PT STABLE AT TIME OF DISCHARGE

## 2021-03-22 ENCOUNTER — Telehealth: Payer: Self-pay

## 2021-03-22 NOTE — Telephone Encounter (Addendum)
5/27 @ 1544- I spoke with Verdis Frederickson, pt's dtr. She states her mom is still doing good. She had a little nausea and is a little tired. I told her to give the anti-emetics PRN and encouraged her to have her mom eat several small meals/snacks per day, rather than 1-2 large meals. I also reminded her of the importance of calling us with temp of 100.4 or higher, DAY OR NIGHT. Verdis Frederickson verbalized understanding.  5/25 @ 1639- I spoke with Verdis Frederickson, pt's dtr. She will call me back in the morning with update.  5/25 @ 1533- No answer.

## 2021-03-23 ENCOUNTER — Encounter: Payer: Self-pay | Admitting: Oncology

## 2021-03-23 NOTE — Progress Notes (Signed)
Sent in request for DOS 06/14 and 06/17 to [employer].

## 2021-03-24 ENCOUNTER — Encounter: Payer: Self-pay | Admitting: Oncology

## 2021-04-06 ENCOUNTER — Ambulatory Visit: Payer: Self-pay | Admitting: Oncology

## 2021-04-06 ENCOUNTER — Other Ambulatory Visit: Payer: Self-pay

## 2021-04-07 NOTE — Progress Notes (Signed)
[facility]  [address] [place],  [place]  [address] [phone]  Clinic Day:  04/11/2021  Referring physician: Marguerita Merles, MD  This document serves as a record of services personally performed by [name], MD. It was created on their behalf by [facility] E, a trained medical scribe. The creation of this record is based on the scribe's personal observations and the provider's statements to them.  HISTORY OF PRESENT ILLNESS:  The patient is a 70 y.o. 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  A PET scan also showed a hypermetabolic subcarinal lymph node.  She comes in today prior to her 2nd cycle of Enhertu.  The patient states that she tolerated her 1st cycle fairly well.  She had a few days of nausea afterwards, but has returned to her baseline.  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     PHYSICAL EXAM:  Blood pressure 135/80, pulse 70, temperature 97.7 F (36.5 C), resp. rate 16, height 5\' 1"  (1.549 m), weight 158 lb (71.7 kg), SpO2 95 %. Wt Readings from Last 3 Encounters:  04/11/21 158 lb (71.7 kg)  03/21/21 152 lb 1.3 oz (69 kg)  03/09/21 155 lb 6.4 oz (70.5 kg)   Body mass index is 29.85 kg/m. Performance status (ECOG): 1 - Symptomatic but completely ambulatory Physical Exam Constitutional:      General: She is not in acute distress.    Appearance: She is normal weight. She is not ill-appearing, toxic-appearing or diaphoretic.     Comments: She is ambulating with a walker.  She has a moderately disheveled appearance.    HENT:     Head: Normocephalic and atraumatic.     Right Ear: Tympanic membrane normal.     Left Ear: Tympanic membrane normal.     Nose: Nose normal. No congestion or  rhinorrhea.     Mouth/Throat:     Mouth: Mucous membranes are moist.     Pharynx: Oropharynx is clear. No oropharyngeal exudate or posterior oropharyngeal erythema.  Eyes:     General: No scleral icterus.       Right eye: No discharge.        Left eye: No discharge.     Extraocular Movements: Extraocular movements intact.     Conjunctiva/sclera: Conjunctivae normal.     Pupils: Pupils are equal, round, and reactive to light.  Neck:     Vascular: No carotid bruit.  Cardiovascular:     Rate and Rhythm: Normal rate and regular rhythm.     Heart sounds: No murmur heard.   No friction rub. No gallop.  Pulmonary:     Effort: Pulmonary effort is normal. No respiratory distress.     Breath sounds: Normal breath sounds. No stridor. No wheezing, rhonchi or rales.  Chest:     Chest wall: No tenderness.  Abdominal:     General: Abdomen is flat. Bowel sounds are normal. There is no distension.     Palpations: There is no mass.     Tenderness: There is no abdominal tenderness. There is no right CVA tenderness, left CVA tenderness, guarding or rebound.     Hernia: No hernia is present.  Musculoskeletal:        General: No swelling, tenderness, deformity or signs of injury.  Normal range of motion.     Cervical back: Normal range of motion and neck supple. No rigidity or tenderness.     Right lower leg: No edema.     Left lower leg: No edema.  Lymphadenopathy:     Cervical: No cervical adenopathy.  Skin:    General: Skin is warm and dry.     Capillary Refill: Capillary refill takes less than 2 seconds.     Coloration: Skin is not jaundiced or pale.     Findings: No bruising, erythema, lesion or rash.  Neurological:     General: No focal deficit present.     Mental Status: She is alert and oriented to person, place, and time. Mental status is at baseline.     Cranial Nerves: No cranial nerve deficit.     Sensory: No sensory deficit.     Motor: No weakness.     Coordination: Coordination  normal.     Gait: Gait normal.     Deep Tendon Reflexes: Reflexes normal.  Psychiatric:        Mood and Affect: Mood normal.        Behavior: Behavior normal.        Thought Content: Thought content normal.        Judgment: Judgment normal.    ASSESSMENT & PLAN:  Assessment/Plan:  A 70 y.o. 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  She will proceed with her 2nd cycle of Enhertu later this week.  Clinically, she appears to be doing much better.  I will see her back in 3 weeks before she heads into her 3rd cycle of treatment.  The patient understands all the plans discussed today and is in agreement with them.     I, Rita Ohara, am acting as scribe for Marice Potter, MD    I have reviewed this report as typed by the medical scribe, and it is complete and accurate.  [name], MD

## 2021-04-11 ENCOUNTER — Other Ambulatory Visit: Payer: Self-pay

## 2021-04-11 ENCOUNTER — Ambulatory Visit: Payer: Self-pay

## 2021-04-11 ENCOUNTER — Encounter: Payer: Self-pay | Admitting: Oncology

## 2021-04-11 ENCOUNTER — Inpatient Hospital Stay: Payer: Self-pay

## 2021-04-11 ENCOUNTER — Inpatient Hospital Stay: Payer: Self-pay | Attending: Oncology | Admitting: Oncology

## 2021-04-11 ENCOUNTER — Other Ambulatory Visit: Payer: Self-pay | Admitting: Oncology

## 2021-04-11 VITALS — BP 135/80 | HR 70 | Temp 97.7°F | Resp 16 | Ht 61.0 in | Wt 158.0 lb

## 2021-04-11 DIAGNOSIS — C7931 Secondary malignant neoplasm of brain: Secondary | ICD-10-CM

## 2021-04-11 DIAGNOSIS — Z79899 Other long term (current) drug therapy: Secondary | ICD-10-CM | POA: Insufficient documentation

## 2021-04-11 DIAGNOSIS — Z5112 Encounter for antineoplastic immunotherapy: Secondary | ICD-10-CM | POA: Insufficient documentation

## 2021-04-11 DIAGNOSIS — C50912 Malignant neoplasm of unspecified site of left female breast: Secondary | ICD-10-CM

## 2021-04-11 DIAGNOSIS — Z171 Estrogen receptor negative status [ER-]: Secondary | ICD-10-CM

## 2021-04-11 DIAGNOSIS — C50412 Malignant neoplasm of upper-outer quadrant of left female breast: Secondary | ICD-10-CM | POA: Insufficient documentation

## 2021-04-11 LAB — BASIC METABOLIC PANEL WITH GFR
BUN: 15 (ref 4–21)
CO2: 22 (ref 13–22)
Chloride: 107 (ref 99–108)
Creatinine: 0.6 (ref 0.5–1.1)
Glucose: 115
Potassium: 4 (ref 3.4–5.3)
Sodium: 138 (ref 137–147)

## 2021-04-11 LAB — CBC AND DIFFERENTIAL
HCT: 38 (ref 36–46)
Hemoglobin: 12.4 (ref 12.0–16.0)
Neutrophils Absolute: 2
Platelets: 278 (ref 150–399)
WBC: 3.7

## 2021-04-11 LAB — HEPATIC FUNCTION PANEL
ALT: 34 (ref 7–35)
AST: 43 — AB (ref 13–35)
Alkaline Phosphatase: 91 (ref 25–125)
Bilirubin, Total: 0.4

## 2021-04-11 LAB — CBC: RBC: 4.1 (ref 3.87–5.11)

## 2021-04-11 LAB — COMPREHENSIVE METABOLIC PANEL WITH GFR
Albumin: 4.3 (ref 3.5–5.0)
Calcium: 9.3 (ref 8.7–10.7)

## 2021-04-14 ENCOUNTER — Inpatient Hospital Stay: Payer: Self-pay

## 2021-04-14 ENCOUNTER — Other Ambulatory Visit: Payer: Self-pay

## 2021-04-14 VITALS — BP 117/77 | HR 79 | Temp 98.2°F | Resp 16 | Wt 157.1 lb

## 2021-04-14 DIAGNOSIS — Z171 Estrogen receptor negative status [ER-]: Secondary | ICD-10-CM

## 2021-04-14 DIAGNOSIS — C50412 Malignant neoplasm of upper-outer quadrant of left female breast: Secondary | ICD-10-CM

## 2021-04-14 MED ORDER — ACETAMINOPHEN 325 MG PO TABS
650.0000 mg | ORAL_TABLET | Freq: Once | ORAL | Status: AC
  Administered 2021-04-14: 650 mg via ORAL

## 2021-04-14 MED ORDER — PALONOSETRON HCL INJECTION 0.25 MG/5ML
INTRAVENOUS | Status: AC
  Filled 2021-04-14: qty 5

## 2021-04-14 MED ORDER — SODIUM CHLORIDE 0.9 % IV SOLN
10.0000 mg | Freq: Once | INTRAVENOUS | Status: AC
  Administered 2021-04-14: 10 mg via INTRAVENOUS
  Filled 2021-04-14: qty 10

## 2021-04-14 MED ORDER — HEPARIN SOD (PORK) LOCK FLUSH 100 UNIT/ML IV SOLN
500.0000 [IU] | Freq: Once | INTRAVENOUS | Status: AC | PRN
  Administered 2021-04-14: 500 [IU]
  Filled 2021-04-14: qty 5

## 2021-04-14 MED ORDER — SODIUM CHLORIDE 0.9% FLUSH
10.0000 mL | INTRAVENOUS | Status: DC | PRN
  Administered 2021-04-14: 10 mL
  Filled 2021-04-14: qty 10

## 2021-04-14 MED ORDER — DIPHENHYDRAMINE HCL 25 MG PO CAPS
50.0000 mg | ORAL_CAPSULE | Freq: Once | ORAL | Status: AC
Start: 2021-04-14 — End: 2021-04-14
  Administered 2021-04-14: 50 mg via ORAL

## 2021-04-14 MED ORDER — PALONOSETRON HCL INJECTION 0.25 MG/5ML
0.2500 mg | Freq: Once | INTRAVENOUS | Status: AC
  Administered 2021-04-14: 0.25 mg via INTRAVENOUS

## 2021-04-14 MED ORDER — FAM-TRASTUZUMAB DERUXTECAN-NXKI CHEMO 100 MG IV SOLR
5.4000 mg/kg | Freq: Once | INTRAVENOUS | Status: AC
  Administered 2021-04-14: 380 mg via INTRAVENOUS
  Filled 2021-04-14: qty 19

## 2021-04-14 MED ORDER — ACETAMINOPHEN 325 MG PO TABS
ORAL_TABLET | ORAL | Status: AC
  Filled 2021-04-14: qty 2

## 2021-04-14 MED ORDER — DEXTROSE 5 % IV SOLN
Freq: Once | INTRAVENOUS | Status: AC
Start: 2021-04-14 — End: 2021-04-14
  Filled 2021-04-14: qty 250

## 2021-04-14 MED ORDER — DIPHENHYDRAMINE HCL 25 MG PO CAPS
ORAL_CAPSULE | ORAL | Status: AC
  Filled 2021-04-14: qty 2

## 2021-04-14 NOTE — Patient Instructions (Signed)
Fam-Trastuzumab deruxtecan injection Qu es este medicamento? TRASTUZUMAB DERUXTECAN es un medicamento quimioteraputico y un anticuerpo monoclonal. Trata ciertos tipos de cncer. Algunos de los tipos de cncertratados son cncer de mama y cncer gstrico. Este medicamento puede ser utilizado para otros usos; si tiene Engineer, petroleum preguntaconsulte con su proveedor de atencin mdica o con su farmacutico. MARCAS COMUNES: ENHERTU Qu le debo informar a mi profesional de la salud antes de tomar estemedicamento? Necesitan saber si usted presenta alguno de los siguientes problemas osituaciones: enfermedad cardiaca insuficiencia cardiaca infeccin (especialmente infecciones virales, como varicela, fuegos labiales o herpes) enfermedad heptica enfermedad pulmonar o respiratoria, como asma una reaccin alrgica o inusual al fam-trastuzumab deruxtecan, a otros medicamentos, alimentos, colorantes o conservantes si est embarazada o buscando quedar embarazada si estamamantando a un beb Cmo debo utilizar este medicamento? Este medicamento se administra mediante infusin en una vena. Lo administra unprofesional de Technical sales engineer en un hospital o en un entorno clnico. Hable con su pediatra para informarse acerca del uso de este medicamento ennios. Puede requerir atencin especial. Sobredosis: Pngase en contacto inmediatamente con un centro toxicolgico o unasala de urgencia si usted cree que haya tomado demasiado medicamento. ATENCIN: [employer] es solo para usted. No comparta este medicamento connadie. Qu sucede si me olvido de una dosis? Es importante no olvidar ninguna dosis. Informe a su mdico o a su profesionalde la salud si no puede asistir a una cita. Qu puede interactuar con este medicamento? No se han realizado estudios de interaccin. Puede ser que esta lista no menciona todas las posibles interacciones. Informe a su profesional de [employer]	[place] de [employer] productos a base de hierbas,  medicamentos de [place] o suplementos nutritivos que est tomando. Si usted fuma, consume bebidas alcohlicas o si utiliza drogas ilegales, indqueselo tambin a su profesional de [employer]	[place]. Algunas sustancias pueden interactuar consu medicamento. A qu debo estar atento al usar Coca[employer]Cola? Visite a su profesional de la salud para que revise regularmente su evolucin. Informe a su profesional de la salud si sus sntomas no comienzan a Architectural technologist. Se supervisar su estado de salud atentamente mientras reciba este medicamento. No debe quedar embarazada mientras est usando este medicamento o por 7 meses despus de dejar de usarlo. Las mujeres deben informar a su profesional de la salud si estn buscando quedar embarazadas o si creen que podran estar embarazadas. Los hombres no deben Social worker a Social research officer, government estn recibiendo Coca[employer]a y [place] 4 meses despus de dejar de usarlo. Existe la posibilidad de que ocurran efectos secundarios graves en un beb sinnacer. Para obtener ms informacin, hable con su profesional de [employer]	[place]. No debe amamantar a un beb mientras est usando este medicamento o durante 53meses despus de la ltima dosis. Este medicamento ha causado recuentos de esperma reducidos en algunos hombres. Esto puede hacer ms difcil que un hombre embarace a Musician. Hable con suprofesional de la salud si le preocupa su fertilidad. Este medicamento podra aumentar el riesgo de moretones o sangrado. Consulte asu profesional de la salud si observa sangrados inusuales. Proceda con cuidado al cepillar sus dientes, usar hilo dental o Risk manager palillos para los dientes, ya que podra contraer una infeccin o Therapist, art con mayor facilidad. Si se somete a algn tratamiento dental, informe a su dentistaque est [employer]. Este medicamento puede resecarle los ojos [y provocar visin borrosa]. Si [place] lentes de contacto, puede sentir ciertas molestias. Las gotas  lubricantes para los ojos pueden ser tiles. Si el problema  no desaparece o es severo, [place] su profesional de la salud. Consulte a su profesional de la salud si tiene fiebre, escalofros, dolor de garganta o cualquier otro sntoma de resfro o gripe. No se trate usted mismo. Este medicamento reduce la capacidad del cuerpo para combatir infecciones.Trate de no acercarse a personas que estn enfermas. Evite usar [employer] contienen aspirina, acetaminofeno, ibuprofeno, naproxeno o ketoprofeno, a menos que as lo indique su profesional de [employer]	[place].Estos productos pueden ocultar la fiebre. Qu efectos secundarios puedo tener al [employer] este medicamento? Efectos secundarios que debe informar a su mdico o a su profesional de lasalud tan pronto como sea posible: Chief of Staff, tales como erupcin cutnea, comezn/picazn o urticaria, e hinchazn de la cara, los labios o la lengua problemas para respirar tos nuseas, vmito signos y sntomas de sangrado, tales como heces con sangre o de color negro y aspecto alquitranado; orina de color rojo o marrn oscuro; escupir sangre o material marrn que tiene el aspecto de posos (residuos) de caf; Tree surgeon rojas en la piel; sangrado o moretones inusuales en los ojos, las encas o la nariz signos y sntomas de insuficiencia cardiaca tales como problemas para respirar; frecuencia cardiaca rpida, irregular; aumento repentino de peso; hinchazn de los tobillos, pies, manos; debilidad o cansancio inusual signos y sntomas de infeccin, tales como fiebre, escalofros, tos, dolor de garganta, Social research officer, government o dificultad para Garment/textile technologist signos y sntomas de baja cantidad de glbulos rojos o anemia, tales como debilidad o cansancio inusuales; sensacin de desmayo o aturdimiento; cadas; problemaspara respirar Efectos secundarios que generalmente no requieren atencin mdica (debe informarlos a su mdico o a Barrister's clerk de la salud si persisten o si  sonmolestos): estreimiento diarrea ojos secos cada del cabello prdida del apetito llagasen la boca erupcin cutnea Puede ser que esta lista no menciona todos los posibles efectos secundarios. Comunquese a su mdico por asesoramiento mdico [employer]. Usted puede informar los efectos secundarios a la FDA por telfono al1-800-FDA-1088. Dnde debo guardar mi medicina? Este medicamento se administra en hospitales o clnicas, y no necesitarguardarlo en su domicilio. ATENCIN: Este folleto es un resumen. Puede ser que no cubra toda la posible informacin. Si usted tiene preguntas acerca de esta medicina, consulte con sumdico, su farmacutico o su profesional de Technical sales engineer.  2022 Elsevier/Gold Standard (2020-04-19 00:00:00)

## 2021-04-19 ENCOUNTER — Encounter: Payer: Self-pay | Admitting: Oncology

## 2021-04-25 NOTE — Progress Notes (Signed)
Sent in request for DOS 07/07 and 07/08 to [employer].

## 2021-04-27 NOTE — Progress Notes (Signed)
[facility]  [address] [place],  [place]  [address] [phone]  Clinic Day:  05/04/2021  Referring physician: Marguerita Merles, MD  This document serves as a record of services personally performed by [name], MD. It was created on their behalf by [employer] E, a trained medical scribe. The creation of this record is based on the scribe's personal observations and the provider's statements to them.  HISTORY OF PRESENT ILLNESS:  The patient is a 70 y.o. 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  A PET scan also showed a hypermetabolic subcarinal lymph node.  She comes in today prior to her 3rd cycle of Enhertu.  The patient states that she tolerated her 2nd cycle fairly well.  As it pertains to her previously metastatic CNS disease, she denies having any significant headaches, balance or vision problems which concern her for early disease recurrence.     PHYSICAL EXAM:  Blood pressure 118/74, pulse 76, temperature 98 F (Teresa.7 C), resp. rate 16, height 5\' 1"  (1.549 m), weight 155 lb 9.6 oz (70.6 kg), SpO2 95 %. Wt Readings from Last 3 Encounters:  05/05/21 153 lb 1.9 oz (69.5 kg)  05/04/21 155 lb 9.6 oz (70.6 kg)  04/14/21 157 lb 1.9 oz (71.3 kg)   Body mass index is 29.4 kg/m. Performance status (ECOG): 1 - Symptomatic but completely ambulatory Physical Exam Constitutional:      General: She is not in acute distress.    Appearance: She is normal weight. She is not ill-appearing, toxic-appearing or diaphoretic.     Comments: She is much stronger today and is ambulating without assistance.    HENT:     Head: Normocephalic and atraumatic.     Right Ear: Tympanic membrane normal.     Left Ear: Tympanic membrane normal.     Nose: Nose normal. No congestion or rhinorrhea.     Mouth/Throat:     Mouth: Mucous membranes are moist.      Pharynx: Oropharynx is clear. No oropharyngeal exudate or posterior oropharyngeal erythema.  Eyes:     General: No scleral icterus.       Right eye: No discharge.        Left eye: No discharge.     Extraocular Movements: Extraocular movements intact.     Conjunctiva/sclera: Conjunctivae normal.     Pupils: Pupils are equal, round, and reactive to light.  Neck:     Vascular: No carotid bruit.  Cardiovascular:     Rate and Rhythm: Normal rate and regular rhythm.     Heart sounds: No murmur heard.   No friction rub. No gallop.  Pulmonary:     Effort: Pulmonary effort is normal. No respiratory distress.     Breath sounds: Normal breath sounds. No stridor. No wheezing, rhonchi or rales.  Chest:     Chest wall: No tenderness.  Abdominal:     General: Abdomen is flat. Bowel sounds are normal. There is no distension.     Palpations: There is no mass.     Tenderness: There is no abdominal tenderness. There is no right CVA tenderness, left CVA tenderness, guarding or rebound.     Hernia: No hernia is present.  Musculoskeletal:        General: No swelling, tenderness, deformity or signs of injury. Normal range of motion.     Cervical back: Normal range of  motion and neck supple. No rigidity or tenderness.     Right lower leg: No edema.     Left lower leg: No edema.  Lymphadenopathy:     Cervical: No cervical adenopathy.  Skin:    General: Skin is warm and dry.     Capillary Refill: Capillary refill takes less than 2 seconds.     Coloration: Skin is not jaundiced or pale.     Findings: No bruising, erythema, lesion or rash.  Neurological:     General: No focal deficit present.     Mental Status: She is alert and oriented to person, place, and time. Mental status is at baseline.     Cranial Nerves: No cranial nerve deficit.     Sensory: No sensory deficit.     Motor: No weakness.     Coordination: Coordination normal.     Gait: Gait normal.     Deep Tendon Reflexes: Reflexes normal.   Psychiatric:        Mood and Affect: Mood normal.        Behavior: Behavior normal.        Thought Content: Thought content normal.        Judgment: Judgment normal.    ASSESSMENT & PLAN:  Assessment/Plan:  A 70 y.o. 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 She also has subcarinal nodal disease.  She will proceed with her 3rd cycle of Enhertu later this week.  Clinically, she appears to be doing much better.  I will see her back in 3 weeks before she heads into her 4th cycle of treatment.  The patient understands all the plans discussed today and is in agreement with them.     I, Rita Ohara, am acting as scribe for Marice Potter, MD    I have reviewed this report as typed by the medical scribe, and it is complete and accurate.  [name], MD

## 2021-05-04 ENCOUNTER — Inpatient Hospital Stay (HOSPITAL_BASED_OUTPATIENT_CLINIC_OR_DEPARTMENT_OTHER): Payer: Self-pay | Admitting: Oncology

## 2021-05-04 ENCOUNTER — Inpatient Hospital Stay: Payer: Self-pay | Attending: Oncology

## 2021-05-04 ENCOUNTER — Other Ambulatory Visit: Payer: Self-pay

## 2021-05-04 ENCOUNTER — Other Ambulatory Visit: Payer: Self-pay | Admitting: Oncology

## 2021-05-04 ENCOUNTER — Telehealth: Payer: Self-pay | Admitting: Oncology

## 2021-05-04 ENCOUNTER — Other Ambulatory Visit: Payer: Self-pay | Admitting: Hematology and Oncology

## 2021-05-04 VITALS — BP 118/74 | HR 76 | Temp 98.0°F | Resp 16 | Ht 61.0 in | Wt 155.6 lb

## 2021-05-04 DIAGNOSIS — Z171 Estrogen receptor negative status [ER-]: Secondary | ICD-10-CM | POA: Insufficient documentation

## 2021-05-04 DIAGNOSIS — C50912 Malignant neoplasm of unspecified site of left female breast: Secondary | ICD-10-CM

## 2021-05-04 DIAGNOSIS — C50412 Malignant neoplasm of upper-outer quadrant of left female breast: Secondary | ICD-10-CM

## 2021-05-04 DIAGNOSIS — C7931 Secondary malignant neoplasm of brain: Secondary | ICD-10-CM

## 2021-05-04 DIAGNOSIS — Z5112 Encounter for antineoplastic immunotherapy: Secondary | ICD-10-CM | POA: Insufficient documentation

## 2021-05-04 DIAGNOSIS — Z79899 Other long term (current) drug therapy: Secondary | ICD-10-CM | POA: Insufficient documentation

## 2021-05-04 LAB — CBC: RBC: 3.96 (ref 3.87–5.11)

## 2021-05-04 LAB — HEPATIC FUNCTION PANEL
ALT: 40 — AB (ref 7–35)
AST: 48 — AB (ref 13–35)
Alkaline Phosphatase: 103 (ref 25–125)
Bilirubin, Total: 0.5

## 2021-05-04 LAB — BASIC METABOLIC PANEL
BUN: 10 (ref 4–21)
CO2: 23 — AB (ref 13–22)
Chloride: 107 (ref 99–108)
Creatinine: 0.7 (ref 0.5–1.1)
Glucose: 147
Potassium: 4 (ref 3.4–5.3)
Sodium: 139 (ref 137–147)

## 2021-05-04 LAB — COMPREHENSIVE METABOLIC PANEL
Albumin: 4.1 (ref 3.5–5.0)
Calcium: 9.3 (ref 8.7–10.7)

## 2021-05-04 LAB — CBC AND DIFFERENTIAL
HCT: 37 (ref 36–46)
Hemoglobin: 12.2 (ref 12.0–16.0)
Neutrophils Absolute: 1.51
Platelets: 228 (ref 150–399)
WBC: 2.8

## 2021-05-04 NOTE — Telephone Encounter (Signed)
Per 7/7 los next appt scheduled and given to patient

## 2021-05-04 NOTE — Progress Notes (Signed)
T

## 2021-05-05 ENCOUNTER — Inpatient Hospital Stay: Payer: Self-pay

## 2021-05-05 ENCOUNTER — Other Ambulatory Visit: Payer: Self-pay

## 2021-05-05 VITALS — BP 103/73 | HR 79 | Temp 98.1°F | Resp 18 | Ht 61.0 in | Wt 153.1 lb

## 2021-05-05 DIAGNOSIS — C50412 Malignant neoplasm of upper-outer quadrant of left female breast: Secondary | ICD-10-CM

## 2021-05-05 DIAGNOSIS — Z171 Estrogen receptor negative status [ER-]: Secondary | ICD-10-CM

## 2021-05-05 MED ORDER — PALONOSETRON HCL INJECTION 0.25 MG/5ML
INTRAVENOUS | Status: AC
  Filled 2021-05-05: qty 5

## 2021-05-05 MED ORDER — ACETAMINOPHEN 325 MG PO TABS
650.0000 mg | ORAL_TABLET | Freq: Once | ORAL | Status: AC
  Administered 2021-05-05: 650 mg via ORAL

## 2021-05-05 MED ORDER — DIPHENHYDRAMINE HCL 25 MG PO CAPS
50.0000 mg | ORAL_CAPSULE | Freq: Once | ORAL | Status: AC
  Administered 2021-05-05: 50 mg via ORAL

## 2021-05-05 MED ORDER — PALONOSETRON HCL INJECTION 0.25 MG/5ML
0.2500 mg | Freq: Once | INTRAVENOUS | Status: AC
  Administered 2021-05-05: 0.25 mg via INTRAVENOUS

## 2021-05-05 MED ORDER — SODIUM CHLORIDE 0.9% FLUSH
10.0000 mL | INTRAVENOUS | Status: DC | PRN
  Administered 2021-05-05: 10 mL
  Filled 2021-05-05: qty 10

## 2021-05-05 MED ORDER — DEXTROSE 5 % IV SOLN
Freq: Once | INTRAVENOUS | Status: AC
  Filled 2021-05-05: qty 250

## 2021-05-05 MED ORDER — DIPHENHYDRAMINE HCL 25 MG PO CAPS
ORAL_CAPSULE | ORAL | Status: AC
  Filled 2021-05-05: qty 2

## 2021-05-05 MED ORDER — ACETAMINOPHEN 325 MG PO TABS
ORAL_TABLET | ORAL | Status: AC
  Filled 2021-05-05: qty 2

## 2021-05-05 MED ORDER — HEPARIN SOD (PORK) LOCK FLUSH 100 UNIT/ML IV SOLN
500.0000 [IU] | Freq: Once | INTRAVENOUS | Status: AC | PRN
  Administered 2021-05-05: 500 [IU]
  Filled 2021-05-05: qty 5

## 2021-05-05 MED ORDER — FAM-TRASTUZUMAB DERUXTECAN-NXKI CHEMO 100 MG IV SOLR
5.4000 mg/kg | Freq: Once | INTRAVENOUS | Status: AC
  Administered 2021-05-05: 380 mg via INTRAVENOUS
  Filled 2021-05-05: qty 19

## 2021-05-05 MED ORDER — SODIUM CHLORIDE 0.9 % IV SOLN
10.0000 mg | Freq: Once | INTRAVENOUS | Status: AC
  Administered 2021-05-05: 10 mg via INTRAVENOUS
  Filled 2021-05-05: qty 10

## 2021-05-05 NOTE — Patient Instructions (Signed)
Teresa [name]  Discharge Instructions: Thank you for choosing [facility] to provide your oncology and hematology care.  If you have a lab appointment with the [facility], please go directly to the [facility] and check in at the registration area.   Wear comfortable clothing and clothing appropriate for easy access to any Portacath or PICC line.   We strive to give you quality time with your provider. You may need to reschedule your appointment if you arrive late (15 or more minutes).  Arriving late affects you and other patients whose appointments are after yours.  Also, if you miss three or more appointments without notifying the office, you may be dismissed from the clinic at the provider's discretion.      For prescription refill requests, have your pharmacy contact our office and allow 72 hours for refills to be completed.    Today you received the following chemotherapy and/or immunotherapy agents enhertu    To help prevent nausea and vomiting after your treatment, we encourage you to take your nausea medication as directed.  BELOW ARE SYMPTOMS THAT SHOULD BE REPORTED IMMEDIATELY: *FEVER GREATER THAN 100.4 F (38 C) OR HIGHER *CHILLS OR SWEATING *NAUSEA AND VOMITING THAT IS NOT CONTROLLED WITH YOUR NAUSEA MEDICATION *UNUSUAL SHORTNESS OF BREATH *UNUSUAL BRUISING OR BLEEDING *URINARY PROBLEMS (pain or burning when urinating, or frequent urination) *BOWEL PROBLEMS (unusual diarrhea, constipation, pain near the anus) TENDERNESS IN MOUTH AND THROAT WITH OR WITHOUT PRESENCE OF ULCERS (sore throat, sores in mouth, or a toothache) UNUSUAL RASH, SWELLING OR PAIN  UNUSUAL VAGINAL DISCHARGE OR ITCHING   Items with * indicate a potential emergency and should be followed up as soon as possible or go to the Emergency Department if any problems should occur.  Please show the CHEMOTHERAPY ALERT CARD or IMMUNOTHERAPY ALERT CARD at check-in to the  Emergency Department and triage nurse.  Should you have questions after your visit or need to cancel or reschedule your appointment, please contact [facility]  Dept: [phone]  and follow the prompts.  Office hours are 8:00 a.m. to 4:30 p.m. Monday - Friday. Please note that voicemails left after 4:00 p.m. may not be returned until the following business day.  We are closed weekends and major holidays. You have access to a nurse at all times for urgent questions. Please call the main number to the clinic Dept: [phone] and follow the prompts.  For any non-urgent questions, you may also contact your provider using MyChart. We now offer e-Visits for anyone 74 and older to request care online for non-urgent symptoms. For details visit [email or web address].   Also download the MyChart app! Go to the app store, search "MyChart", open the app, select [facility], and log in with your MyChart username and password.  Due to Covid, a mask is required upon entering the hospital/clinic. If you do not have a mask, one will be given to you upon arrival. For doctor visits, patients may have 1 support person aged 62 or older with them. For treatment visits, patients cannot have anyone with them due to current Covid guidelines and our immunocompromised population.   Fam-Trastuzumab deruxtecan injection Qu es este medicamento? TRASTUZUMAB DERUXTECAN es un medicamento quimioteraputico y un anticuerpo monoclonal. Trata ciertos tipos de cncer. Algunos de los tipos de cncertratados son cncer de mama y cncer gstrico. Este medicamento puede ser utilizado para otros usos; si tiene Engineer, petroleum preguntaconsulte con su proveedor de atencin mdica  o con su farmacutico. MARCAS COMUNES: ENHERTU Qu le debo informar a mi profesional de la salud antes de tomar estemedicamento? Necesitan saber si usted presenta alguno de los siguientes problemas osituaciones: enfermedad cardiaca  insuficiencia cardiaca infeccin (especialmente infecciones virales, como varicela, fuegos labiales o herpes) enfermedad heptica enfermedad pulmonar o respiratoria, como asma una reaccin alrgica o inusual al fam-trastuzumab deruxtecan, a otros medicamentos, alimentos, colorantes o conservantes si est embarazada o buscando quedar embarazada si estamamantando a un beb Cmo debo utilizar este medicamento? Este medicamento se administra mediante infusin en una vena. Lo administra unprofesional de Technical sales engineer en un hospital o en un entorno clnico. Hable con su pediatra para informarse acerca del uso de este medicamento ennios. Puede requerir atencin especial. Sobredosis: Pngase en contacto inmediatamente con un centro toxicolgico o unasala de urgencia si usted cree que haya tomado demasiado medicamento. ATENCIN: [employer] es solo para usted. No comparta este medicamento connadie. Qu sucede si me olvido de una dosis? Es importante no olvidar ninguna dosis. Informe a su mdico o a su profesionalde la salud si no puede asistir a una cita. Qu puede interactuar con este medicamento? No se han realizado estudios de interaccin. Puede ser que esta lista no menciona todas las posibles interacciones. Informe a su profesional de [employer]	[place] de [employer] productos a base de hierbas, medicamentos de [place] o suplementos nutritivos que est tomando. Si usted fuma, consume bebidas alcohlicas o si utiliza drogas ilegales, indqueselo tambin a su profesional de [employer]	[place]. Algunas sustancias pueden interactuar consu medicamento. A qu debo estar atento al usar [employer]a? Visite a su profesional de la salud para que revise regularmente su evolucin. Informe a su profesional de la salud si sus sntomas no comienzan a Architectural technologist. Se supervisar su estado de salud atentamente mientras reciba este medicamento. No debe quedar embarazada mientras est usando este medicamento o por 7 meses  despus de dejar de usarlo. Las mujeres deben informar a su profesional de la salud si estn buscando quedar embarazadas o si creen que podran estar embarazadas. Los hombres no deben Social worker a Social research officer, government estn recibiendo [employer] y [place] 4 meses despus de dejar de usarlo. Existe la posibilidad de que ocurran efectos secundarios graves en un beb sinnacer. Para obtener ms informacin, hable con su profesional de [employer]	[place]. No debe amamantar a un beb mientras est usando este medicamento o durante 60meses despus de la ltima dosis. Este medicamento ha causado recuentos de esperma reducidos en algunos hombres. Esto puede hacer ms difcil que un hombre embarace a Musician. Hable con suprofesional de la salud si le preocupa su fertilidad. Este medicamento podra aumentar el riesgo de moretones o sangrado. Consulte asu profesional de la salud si observa sangrados inusuales. Proceda con cuidado al cepillar sus dientes, usar hilo dental o Risk manager palillos para los dientes, ya que podra contraer una infeccin o Therapist, art con mayor facilidad. Si se somete a algn tratamiento dental, informe a su dentistaque est [employer]. Este medicamento puede resecarle los ojos [y provocar visin borrosa]. Si [place] lentes de contacto, puede sentir ciertas molestias. Las gotas lubricantes para los ojos pueden ser tiles. Si el problema no desaparece o es severo, [place] su profesional de la salud. Consulte a su profesional de la salud si tiene fiebre, escalofros, dolor de garganta o cualquier otro sntoma de resfro o gripe. No se trate usted mismo. Este medicamento reduce la capacidad del cuerpo para combatir infecciones.Trate de no acercarse a Engineer, manufacturing  que estn enfermas. Evite usar [employer] contienen aspirina, acetaminofeno, ibuprofeno, naproxeno o ketoprofeno, a menos que as lo indique su profesional de [employer]	[place].Estos productos pueden ocultar la fiebre. Qu efectos secundarios  puedo tener al [employer] este medicamento? Efectos secundarios que debe informar a su mdico o a su profesional de lasalud tan pronto como sea posible: Chief of Staff, tales como erupcin cutnea, comezn/picazn o urticaria, e hinchazn de la cara, los labios o la lengua problemas para respirar tos nuseas, vmito signos y sntomas de sangrado, tales como heces con sangre o de color negro y aspecto alquitranado; orina de color rojo o marrn oscuro; escupir sangre o material marrn que tiene el aspecto de posos (residuos) de caf; Tree surgeon rojas en la piel; sangrado o moretones inusuales en los ojos, las encas o la nariz signos y sntomas de insuficiencia cardiaca tales como problemas para respirar; frecuencia cardiaca rpida, irregular; aumento repentino de peso; hinchazn de los tobillos, pies, manos; debilidad o cansancio inusual signos y sntomas de infeccin, tales como fiebre, escalofros, tos, dolor de garganta, Social research officer, government o dificultad para Garment/textile technologist signos y sntomas de baja cantidad de glbulos rojos o anemia, tales como debilidad o cansancio inusuales; sensacin de desmayo o aturdimiento; cadas; problemaspara respirar Efectos secundarios que generalmente no requieren atencin mdica (debe informarlos a su mdico o a Barrister's clerk de la salud si persisten o si sonmolestos): estreimiento diarrea ojos secos cada del cabello prdida del apetito llagasen la boca erupcin cutnea Puede ser que esta lista no menciona todos los posibles efectos secundarios. Comunquese a su mdico por asesoramiento mdico [employer]. Usted puede informar los efectos secundarios a la FDA por telfono al1-800-FDA-1088. Dnde debo guardar mi medicina? Este medicamento se administra en hospitales o clnicas, y no necesitarguardarlo en su domicilio. ATENCIN: Este folleto es un resumen. Puede ser que no cubra toda la posible informacin. Si usted tiene preguntas acerca de esta medicina, consulte con  sumdico, su farmacutico o su profesional de Technical sales engineer.  2022 Elsevier/Gold Standard (2020-04-19 00:00:00)

## 2021-05-08 NOTE — Progress Notes (Signed)
Sent in request for DOS 07/28 and 07/29 to [employer].

## 2021-05-09 ENCOUNTER — Encounter: Payer: Self-pay | Admitting: Oncology

## 2021-05-10 ENCOUNTER — Encounter: Payer: Self-pay | Admitting: Oncology

## 2021-05-25 ENCOUNTER — Inpatient Hospital Stay: Payer: Self-pay

## 2021-05-25 ENCOUNTER — Encounter: Payer: Self-pay | Admitting: Hematology and Oncology

## 2021-05-25 ENCOUNTER — Other Ambulatory Visit: Payer: Self-pay

## 2021-05-25 ENCOUNTER — Inpatient Hospital Stay (HOSPITAL_BASED_OUTPATIENT_CLINIC_OR_DEPARTMENT_OTHER): Payer: Self-pay | Admitting: Hematology and Oncology

## 2021-05-25 VITALS — BP 126/79 | HR 63 | Temp 98.0°F | Resp 14 | Ht 61.0 in | Wt 155.9 lb

## 2021-05-25 DIAGNOSIS — Z171 Estrogen receptor negative status [ER-]: Secondary | ICD-10-CM

## 2021-05-25 DIAGNOSIS — C50412 Malignant neoplasm of upper-outer quadrant of left female breast: Secondary | ICD-10-CM

## 2021-05-25 DIAGNOSIS — C50912 Malignant neoplasm of unspecified site of left female breast: Secondary | ICD-10-CM

## 2021-05-25 DIAGNOSIS — C7931 Secondary malignant neoplasm of brain: Secondary | ICD-10-CM

## 2021-05-25 LAB — BASIC METABOLIC PANEL
BUN: 19 (ref 4–21)
CO2: 23 — AB (ref 13–22)
Chloride: 107 (ref 99–108)
Creatinine: 0.6 (ref 0.5–1.1)
Glucose: 103
Potassium: 4 (ref 3.4–5.3)
Sodium: 139 (ref 137–147)

## 2021-05-25 LAB — HEPATIC FUNCTION PANEL
ALT: 48 — AB (ref 7–35)
AST: 54 — AB (ref 13–35)
Alkaline Phosphatase: 109 (ref 25–125)
Bilirubin, Total: 0.4

## 2021-05-25 LAB — CBC AND DIFFERENTIAL
HCT: 35 — AB (ref 36–46)
Hemoglobin: 11.5 — AB (ref 12.0–16.0)
Neutrophils Absolute: 1.95
Platelets: 226 (ref 150–399)
WBC: 3.2

## 2021-05-25 LAB — COMPREHENSIVE METABOLIC PANEL
Albumin: 4.1 (ref 3.5–5.0)
Calcium: 9.5 (ref 8.7–10.7)

## 2021-05-25 LAB — CBC: RBC: 3.69 — AB (ref 3.87–5.11)

## 2021-05-25 NOTE — Progress Notes (Signed)
[facility]  [address] [place],  [place]  [address] [phone]  Clinic Day:  05/25/2021  Referring physician: Marguerita Merles, MD   CHIEF COMPLAINT:  CC:  Metastatic HER2 positive breast cancer  Current Treatment:   Enhertu every 3 weeks   HISTORY OF PRESENT ILLNESS:  Teresa [name] is a 70 y.o. 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 She had previous spread of disease to her right supraclavicular and mediastinal lymph nodes, for which she received 24 cycles of TDM-1, with the last cycle given in late August 2021.  She took multiple months off of any form of therapy before her metastatic CNS disease returned. PET scan revealed a hypermetabolic 1.5 cm subcarinal lymph node. She was started on palliative HER2 targeted antibody-drug conjugate, Enhertu, in May.    INTERVAL HISTORY:  Teresa [name] is here today for repeat clinical assessment prior to a 4th cycle of ado-trastuzumab. She states she tolerated her 3rd cycle fairly well. Her main complaint today is fatigue. She has mild nausea after treatment, for which medication is effective.  She reports occasional headaches, which are not severe. She states when she is extremely fatigued she occasionally has blurry vision, dizziness and difficulty concentrating, but all of the symptoms resolve with rest. She also feels she has been more forgetful. She denies fevers or chills. She denies pain. Her appetite is good. Her weight has been stable.  REVIEW OF SYSTEMS:  Review of Systems  Constitutional:  Positive for fatigue. Negative for appetite change, chills, fever and unexpected weight change.  HENT:   Negative for lump/mass, mouth sores and sore throat.   Respiratory:  Negative for cough and shortness of breath.   Cardiovascular:  Negative for chest pain and leg swelling.   Gastrointestinal:  Negative for abdominal pain, constipation, diarrhea, nausea and vomiting.  Endocrine: Negative for hot flashes.  Genitourinary:  Negative for difficulty urinating, dysuria, frequency and hematuria.   Musculoskeletal:  Negative for arthralgias, back pain and myalgias.  Skin:  Negative for rash.  Neurological:  Positive for dizziness (intermittent, mild) and headaches (intermittent, mild).  Hematological:  Negative for adenopathy. Does not bruise/bleed easily.  Psychiatric/Behavioral:  Positive for decreased concentration (intermittent). Negative for depression, sleep disturbance and suicidal ideas. The patient is not nervous/anxious.     VITALS:  Blood pressure 126/79, pulse 63, temperature 98 F (36.7 C), resp. rate 14, height _0  (1.549 m), weight 155 lb 14.4 oz (70.7 kg), SpO2 96 %.  Wt Readings from Last 3 Encounters:  05/25/21 155 lb 14.4 oz (70.7 kg)  05/05/21 153 lb 1.9 oz (69.5 kg)  05/04/21 155 lb 9.6 oz (70.6 kg)    Body mass index is 29.46 kg/m.  Performance status (ECOG): 1 - Symptomatic but completely ambulatory  PHYSICAL EXAM:  Physical Exam Vitals and nursing note reviewed.  Constitutional:      General: She is not in acute distress.    Appearance: Normal appearance.  HENT:     Head: Normocephalic and atraumatic.     Mouth/Throat:     Mouth: Mucous membranes are moist.     Pharynx: Oropharynx is clear. No oropharyngeal exudate or posterior oropharyngeal erythema.  Eyes:     General: No scleral icterus.    Extraocular Movements: Extraocular movements intact.     Conjunctiva/sclera: Conjunctivae normal.     Pupils: Pupils are equal,  round, and reactive to light.  Cardiovascular:     Rate and Rhythm: Normal rate and regular rhythm.     Heart sounds: Normal heart sounds. No murmur heard.   No friction rub. No gallop.  Pulmonary:     Effort: Pulmonary effort is normal.     Breath sounds: Normal breath sounds. No wheezing, rhonchi or rales.   Chest:  Breasts:    Right: No axillary adenopathy or supraclavicular adenopathy.     Left: No axillary adenopathy or supraclavicular adenopathy.     Comments:  Breast exam is deferred Abdominal:     General: There is no distension.     Palpations: Abdomen is soft. There is no hepatomegaly, splenomegaly or mass.     Tenderness: There is no abdominal tenderness.  Musculoskeletal:        General: Normal range of motion.     Cervical back: Normal range of motion and neck supple. No tenderness.     Right lower leg: No edema.     Left lower leg: No edema.  Lymphadenopathy:     Cervical: No cervical adenopathy.     Upper Body:     Right upper body: No supraclavicular or axillary adenopathy.     Left upper body: No supraclavicular or axillary adenopathy.     Lower Body: No right inguinal adenopathy. No left inguinal adenopathy.  Skin:    General: Skin is warm and dry.     Coloration: Skin is not jaundiced.     Findings: No rash.  Neurological:     Mental Status: She is alert and oriented to person, place, and time.     Cranial Nerves: No cranial nerve deficit.     Sensory: Sensation is intact.     Motor: Motor function is intact.     Gait: Gait is intact.  Psychiatric:        Mood and Affect: Mood normal.        Behavior: Behavior normal.        Thought Content: Thought content normal.    LABS:   CBC Latest Ref Rng & Units 05/25/2021 05/04/2021 04/11/2021  WBC - 3.2 2.8 3.7  Hemoglobin 12.0 - 16.0 11.5(A) 12.2 12.4  Hematocrit 36 - 46 35(A) 37 38  Platelets 150 - 399 226 228 278   CMP Latest Ref Rng & Units 05/25/2021 05/04/2021 04/11/2021  Glucose 70 - 99 mg/dL - - -  BUN 4 - _0 Creatinine 0.5 - 1.1 0.6 0.7 0.6  Sodium 137 - 147 139 139 138  Potassium 3.4 - 5.3 4.0 4.0 4.0  Chloride 99 - 108 107 107 107  CO2 13 - 22 23(A) 23(A) 22  Calcium 8.7 - 10.7 9.5 9.3 9.3  Total Protein 6.5 - 8.1 g/dL - - -  Total Bilirubin 0.3 - 1.2 mg/dL - - -  Alkaline Phos 25 - 125 109  103 91  AST 13 - 35 54(A) 48(A) 43(A)  ALT 7 - 35 48(A) 40(A) 34     No results found for: CEA1 / No results found for: CEA1 No results found for: PSA1 No results found for: FTD322 No results found for: GUR427  No results found for: TOTALPROTELP, ALBUMINELP, A1GS, A2GS, BETS, BETA2SER, GAMS, MSPIKE, SPEI No results found for: TIBC, FERRITIN, IRONPCTSAT No results found for: LDH  STUDIES:  No results found.    HISTORY:   Past Medical History:  Diagnosis Date   Arthritis    Lung cancer (  [facility]) 2015    Past Surgical History:  Procedure Laterality Date   APPLICATION OF CRANIAL NAVIGATION N/A 02/06/2021   Procedure: APPLICATION OF CRANIAL NAVIGATION;  Surgeon: Newman Pies, MD;  Location: [facility];  Service: Neurosurgery;  Laterality: N/A;   BREAST LUMPECTOMY Left 2015   BREAST SURGERY Left    tumor removal   CRANIOTOMY N/A 02/06/2021   Procedure: Suboccipital Craniectomy for Tumor Resection with Brainlab Navigation;  Surgeon: Newman Pies, MD;  Location: [facility];  Service: Neurosurgery;  Laterality: N/A;   TUBAL LIGATION      No family history on file.  Social History:  reports that she has quit smoking. Her smoking use included cigarettes. She has never used smokeless tobacco. She reports previous alcohol use. She reports that she does not use drugs.The patient is alone today.  Allergies: No Known Allergies  Current Medications: Current Outpatient Medications  Medication Sig Dispense Refill   acetaminophen (TYLENOL) 325 MG tablet Take 2 tablets (650 mg total) by mouth every 4 (four) hours as needed for mild pain (temp > 100.5).     B Complex-C (B-COMPLEX WITH VITAMIN C) tablet Take 1 tablet by mouth daily after lunch. 30 tablet 0   Cholecalciferol (VITAMIN D3 PO) Take 1 tablet by mouth daily after lunch.     dexamethasone (DECADRON) 4 MG tablet Take 1 tablet (4 mg total) by mouth daily. 3 tablet 0   docusate sodium (COLACE) 100 MG capsule Take 1 capsule (100 mg total)  by mouth 2 (two) times daily. 10 capsule 0   HYDROcodone-acetaminophen (NORCO/VICODIN) 5-325 MG tablet Take 1 tablet by mouth every 8 (eight) hours as needed for moderate pain. 30 tablet 0   magnesium oxide (MAG-OX) 400 MG tablet Take 1 tablet (400 mg total) by mouth daily after lunch. 30 tablet 0   meclizine (ANTIVERT) 12.5 MG tablet Take 1 tablet (12.5 mg total) by mouth 2 (two) times daily as needed for dizziness. 30 tablet 0   Omega-3 Fatty Acids (OMEGA 3 PO) Take 2 capsules by mouth daily after lunch.     ondansetron (ZOFRAN) 4 MG tablet Take 1 tablet (4 mg total) by mouth every 4 (four) hours as needed for nausea. 90 tablet 3   pantoprazole (PROTONIX) 40 MG tablet Take 1 tablet (40 mg total) by mouth at bedtime. 30 tablet 0   polyethylene glycol (MIRALAX / GLYCOLAX) 17 g packet Take 17 g by mouth 2 (two) times daily as needed for moderate constipation or severe constipation. 14 each 0   Potassium 99 MG TABS Take 1 tablet (99 mg total) by mouth daily after lunch. 20 tablet 0   prochlorperazine (COMPAZINE) 10 MG tablet Take 1 tablet (10 mg total) by mouth every 6 (six) hours as needed for nausea or vomiting. 90 tablet 3   senna (SENOKOT) 8.6 MG TABS tablet Take 2 tablets (17.2 mg total) by mouth at bedtime. 120 tablet 0   No current facility-administered medications for this visit.     ASSESSMENT & PLAN:   Assessment/Plan:  Teresa [name] is a 70 y.o. 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  Based on PET scan, the only other focus of disease is within a subcarinal lymph node. Because this focus of persistent disease and having disease that had metastasized to the brain, she was placed on palliative HER2 targeted therapy, with Enhertu. She is doing fairly well except for fatigue and occasional mild neurologic symptoms  that occur when she is extremely fatigued.  She will proceed with a 4th cycle of  Enhertu tomorrow.  We will plan to see her back in 3 weeks with a CT chest, abdomen and pelvis and MRI brain to reassess her disease baseline prior to a 5th cycle.  She will also be due for an echocardiogram at that time. The patient understands the plans discussed today and is in agreement with them.  She knows to contact our office if she develops concerns prior to her next appointment.  The entire visit was conducted via hospital interpreter.     Teresa Pickles, PA-C

## 2021-05-26 ENCOUNTER — Inpatient Hospital Stay: Payer: Self-pay

## 2021-05-26 VITALS — BP 117/64 | HR 81 | Temp 98.8°F | Resp 18 | Ht 61.0 in | Wt 155.0 lb

## 2021-05-26 MED ORDER — SODIUM CHLORIDE 0.9 % IV SOLN
10.0000 mg | Freq: Once | INTRAVENOUS | Status: AC
  Administered 2021-05-26: 10 mg via INTRAVENOUS
  Filled 2021-05-26: qty 10

## 2021-05-26 MED ORDER — PALONOSETRON HCL INJECTION 0.25 MG/5ML
0.2500 mg | Freq: Once | INTRAVENOUS | Status: AC
Start: 2021-05-26 — End: 2021-05-26
  Administered 2021-05-26: 0.25 mg via INTRAVENOUS

## 2021-05-26 MED ORDER — DIPHENHYDRAMINE HCL 25 MG PO CAPS
ORAL_CAPSULE | ORAL | Status: AC
  Filled 2021-05-26: qty 2

## 2021-05-26 MED ORDER — ACETAMINOPHEN 325 MG PO TABS
650.0000 mg | ORAL_TABLET | Freq: Once | ORAL | Status: AC
  Administered 2021-05-26: 650 mg via ORAL

## 2021-05-26 MED ORDER — FAM-TRASTUZUMAB DERUXTECAN-NXKI CHEMO 100 MG IV SOLR
5.4000 mg/kg | Freq: Once | INTRAVENOUS | Status: AC
  Administered 2021-05-26: 380 mg via INTRAVENOUS
  Filled 2021-05-26: qty 19

## 2021-05-26 MED ORDER — SODIUM CHLORIDE 0.9% FLUSH
10.0000 mL | INTRAVENOUS | Status: DC | PRN
  Administered 2021-05-26: 10 mL
  Filled 2021-05-26: qty 10

## 2021-05-26 MED ORDER — DIPHENHYDRAMINE HCL 25 MG PO CAPS
50.0000 mg | ORAL_CAPSULE | Freq: Once | ORAL | Status: AC
  Administered 2021-05-26: 50 mg via ORAL

## 2021-05-26 MED ORDER — ACETAMINOPHEN 325 MG PO TABS
ORAL_TABLET | ORAL | Status: AC
  Filled 2021-05-26: qty 2

## 2021-05-26 MED ORDER — PALONOSETRON HCL INJECTION 0.25 MG/5ML
INTRAVENOUS | Status: AC
  Filled 2021-05-26: qty 5

## 2021-05-26 MED ORDER — DEXTROSE 5 % IV SOLN
Freq: Once | INTRAVENOUS | Status: AC
  Filled 2021-05-26: qty 250

## 2021-05-26 MED ORDER — HEPARIN SOD (PORK) LOCK FLUSH 100 UNIT/ML IV SOLN
500.0000 [IU] | Freq: Once | INTRAVENOUS | Status: AC | PRN
  Administered 2021-05-26: 500 [IU]
  Filled 2021-05-26: qty 5

## 2021-05-26 NOTE — Patient Instructions (Addendum)
[facility]  Discharge Instructions: Thank you for choosing [facility] to provide your oncology and hematology care.  If you have a lab appointment with the [facility], please go directly to the [facility] and check in at the registration area.   Wear comfortable clothing and clothing appropriate for easy access to any Portacath or PICC line.   We strive to give you quality time with your provider. You may need to reschedule your appointment if you arrive late (15 or more minutes).  Arriving late affects you and other patients whose appointments are after yours.  Also, if you miss three or more appointments without notifying the office, you may be dismissed from the clinic at the provider's discretion.      For prescription refill requests, have your pharmacy contact our office and allow 72 hours for refills to be completed.    Today you received the following chemotherapy and/or immunotherapy agents  ENHERTUFam-Trastuzumab deruxtecan injection Quel est ce mdicament ? Le TRASTUZUMAB DRUXTCAN est un agent chimiothrapeutique et un anticorps monoclonal. Il est indiqu dans le traitement de certains types de cancer, Iltraite notamment les cancers du sein et de Chief Financial Officer. Ce mdicament peut tre utilis pour d'autres indications ; adressez-vous votre mdecin ou  votre pharmacien si vous [employer] questions. NOM(S) DE MARQUE COURANT(S) : ENHERTU Que dois-je dire  mon fournisseur de soins avant de prendre ce mdicament ? Ils ont besoin de savoir si vous prsentez l'une quelconque des affections ousituations suivantes : maladie cardiaque insuffisance cardiaque infection (notamment les infections virales, telles que la varicelle, les boutons de fivre ou l'herps) maladie du foie maladie pulmonaire ou respiratoire, telle que l'asthme raction inhabituelle ou allergique au fam-trastuzumab druxtcan raction inhabituelle ou allergique  d'autres  mdicaments raction inhabituelle ou allergique  des aliments,  des colorants ou  des conservateurs vous tes enceinte ou Database administrator  tre enceinte vous allaitez Comment dois-je utiliser ce mdicament ? Ce mdicament est destin  tre administr par perfusion dans une veine. Ce mdicament est administr par un professionnel de sant dans un hpital ou uneclinique. Interrogez votre pdiatre quant  l'utilisation de ce mdicament [employer].Des prcautions particulires Electrical engineer. Surdosage : si vous pensez avoir pris ce mdicament en excs, contactezimmdiatement un centre [employer] ou un service d'urgences. REMARQUE : ce mdicament vous est uniquement destin. Ne partagez pas Secondary school teacher. Que faire si je saute une dose ? Il est important de ne pas sauter de dose. Contactez votre mdecin ou votrefournisseur de soins si vous ne pouvez vous rendre  Engineer, drilling. Quelles sont les interactions possibles avec ce mdicament ? Des tudes d'interaction n'ont pas t menes. Cette liste peut ne pas dcrire toutes les interactions mdicamenteuses possibles. Donnez  votre fournisseur de [employer] de tous les mdicaments, plantes mdicinales, mdicaments en vente libre ou complments alimentaires que vous prenez. Dites-lui aussi si vous fumez, buvez de l'alcool ou consommez des drogues illicites. Certaines substances peuvent interagir avecvotre mdicament. Que dois-je Museum/gallery exhibitions officer par ce mdicament ? Consultez votre mdecin ou votre fournisseur de soins pour [employer] surveillance rgulire de l'volution de votre tat de sant. Avertissez votre mdecin ou votre fournisseur de soins si vos symptmes ne commencent pas  rgresser ous'ils s'aggravent. Vous serez troitement surveill(e) Radio broadcast assistant par [employer]. Vous ne devez pas tomber Sports administrator par ce mdicament ni au cours des 7 mois suivant l'arrt du  [place]. Les Sales promotion account executive  leur fournisseur de soins si elles souhaitent avoir un enfant ou si elles pensent tre enceintes. Les [employer] ne doivent pas concevoir un enfant Radio broadcast assistant par ce [employer] et [employer] 4 mois suivant Child psychotherapist. Il existe un risque d'effets secondaires graves pour [employer]. Pour plus d'informations, parlez-en  votre mdecin ou  votrefournisseur de soins. N'allaitez pas un nourrisson Radiographer, therapeutic par ce [employer] ni pendant7 mois aprs la prise de la dernire dose. Ce mdicament a entran une diminution de la numration des spermatozodes chez certains hommes. Cela peut accrotre les difficults  concevoir un enfant. Si vous avez des Teacher, early years/pre, parlez-en votre mdecin ou  votre fournisseur de soins. Ce mdicament peut accrotre le risque d'hmatomes (bleus) ou de saignement. Si vous remarquez un quelconque saignement anormal, contactez votre mdecin ouvotre fournisseur de Mining engineer. Soyez prudent(e) lorsque vous vous brossez les dents ou que vous utilisez du fil dentaire ou un cure-dents, car vous pouvez dvelopper une infection ou saigner plus facilement. Si vous devez [employer], dites  votredentiste que vous tes sous traitement par [employer]. Ce mdicament peut entraner une scheresse oculaire (et une vision floue). Si vous portez des [employer], il est possible que vous ressentiez un certain inconfort. Des gouttes lubrifiantes peuvent apporter un soulagement. Contactez votre mdecin ou votre fournisseur de soins en cas de symptmepersistant ou svre. Demandez l'avis de votre mdecin ou de votre fournisseur de soins si vous avez de la fivre, des frissons ou un mal de gorge, ou d'autres symptmes de rhume ou de grippe. Ne pratiquez pas l'automdication. Ce mdicament peut diminuer la capacit de votre organisme  combattre les infections. vitez de vousapprocher des  [employer]. vitez de prendre des [employer] de l'aspirine, du paractamol (acetaminophen), de l'ibuprofne, du naproxne ou du ktoprofne, sauf indication contraire de votre mdecin ou de votre fournisseur de [place]. Cesmdicaments peuvent masquer une fivre. Quels effets secondaires puis-je remarquer en prenant ce mdicament ? Effets secondaires que vous devez Doctor, hospital mdecin ou  votrefournisseur de soins ds que possible : ractions allergiques telles qu'une ruption cutane, des dmangeaisons, de Manufacturing engineer, un gonflement du visage, des lvres ou de la langue difficults pour respirer toux nauses ou vomissements signes et symptmes de saignement tels que selles sanglantes ou noires,  aspect de goudron, urines rouges ou marron fonc, expectoration de sang ou crachats marrons ressemblant  du marc de caf, taches rouges sur la peau, ecchymoses (bleus) ou saignements inhabituels de l'oil, des [employer] ou du nez signes et symptmes d'insuffisance cardiaque, tels que : problmes respiratoires, battements cardiaques rapides et irrguliers, prise de poids soudaine, gonflement des [place], des pieds ou des mains, faiblesse ou fatigue inhabituelle signes et symptmes d'infection, tels que : fivre, frissons, toux, mal de gorge, miction (mission d'urine) douloureuse ou difficile signes et symptmes d'une faible numration des globules rouges (anmie), tels que : faiblesse ou fatigue inhabituelles, sensation d'vanouissement ou d'brit, chutes, problmes respiratoires Effets secondaires ne ncessitant gnralement pas de consultation mdicale (signalez-les  votre mdecin ou  votre fournisseur de soins s'ils persistentou sont gnants) : constipation diarrhe yeux secs perte de cheveux perte d'apptit plaies dans la bouche ruption cutane Cette liste peut ne pas dcrire tous les effets secondaires possibles. Appelez votre mdecin pour [employer]  sujet des [employer]. Vouspouvez signaler les effets secondaires  la FDA, au [phone]. O dois-je conserver mon mdicament ? Ce mdicament est administr par un professionnel de sant  dans un hpital [employer]. Vous ne recevrez pas ce mdicament pour [employer] vous. REMARQUE : cette notice est un rsum. Elle peut ne pas contenir toutes les informations possibles. Si vous avez des questions  propos de ce mdicament,consultez votre mdecin, votre pharmacien ou votre fournisseur de soins.  2022 Elsevier/Gold Standard (2020-04-19 00:00:00)    To help prevent nausea and vomiting after your treatment, we encourage you to take your nausea medication as directed.  BELOW ARE SYMPTOMS THAT SHOULD BE REPORTED IMMEDIATELY: *FEVER GREATER THAN 100.4 F (38 C) OR HIGHER *CHILLS OR SWEATING *NAUSEA AND VOMITING THAT IS NOT CONTROLLED WITH YOUR NAUSEA MEDICATION *UNUSUAL SHORTNESS OF BREATH *UNUSUAL BRUISING OR BLEEDING *URINARY PROBLEMS (pain or burning when urinating, or frequent urination) *BOWEL PROBLEMS (unusual diarrhea, constipation, pain near the anus) TENDERNESS IN MOUTH AND THROAT WITH OR WITHOUT PRESENCE OF ULCERS (sore throat, sores in mouth, or a toothache) UNUSUAL RASH, SWELLING OR PAIN  UNUSUAL VAGINAL DISCHARGE OR ITCHING   Items with * indicate a potential emergency and should be followed up as soon as possible or go to the Emergency Department if any problems should occur.  Please show the CHEMOTHERAPY ALERT CARD or IMMUNOTHERAPY ALERT CARD at check-in to the Emergency Department and triage nurse.  Should you have questions after your visit or need to cancel or reschedule your appointment, please contact [facility]  Dept: [phone]  and follow the prompts.  Office hours are 8:00 a.m. to 4:30 p.m. Monday - Friday. Please note that voicemails left after 4:00 p.m. may not be returned until the following business day.  We are closed  weekends and major holidays. You have access to a nurse at all times for urgent questions. Please call the main number to the clinic Dept: [phone] and follow the prompts.  For any non-urgent questions, you may also contact your provider using MyChart. We now offer e-Visits for anyone 75 and older to request care online for non-urgent symptoms. For details visit [email or web address].   Also download the MyChart app! Go to the app store, search "MyChart", open the app, select [facility], and log in with your MyChart username and password.  Due to Covid, a mask is required upon entering the hospital/clinic. If you do not have a mask, one will be given to you upon arrival. For doctor visits, patients may have 1 support person aged 11 or older with them. For treatment visits, patients cannot have anyone with them due to current Covid guidelines and our immunocompromised population.

## 2021-05-26 NOTE — Patient Instructions (Signed)
[facility]  Discharge Instructions: Thank you for choosing [facility] to provide your oncology and hematology care.  If you have a lab appointment with the [facility], please go directly to the [facility] and check in at the registration area.   Wear comfortable clothing and clothing appropriate for easy access to any Portacath or PICC line.   We strive to give you quality time with your provider. You may need to reschedule your appointment if you arrive late (15 or more minutes).  Arriving late affects you and other patients whose appointments are after yours.  Also, if you miss three or more appointments without notifying the office, you may be dismissed from the clinic at the provider's discretion.      For prescription refill requests, have your pharmacy contact our office and allow 72 hours for refills to be completed.    Today you received the following chemotherapy and/or immunotherapy agents Trastuzumab     To help prevent nausea and vomiting after your treatment, we encourage you to take your nausea medication as directed.  BELOW ARE SYMPTOMS THAT SHOULD BE REPORTED IMMEDIATELY: *FEVER GREATER THAN 100.4 F (38 C) OR HIGHER *CHILLS OR SWEATING *NAUSEA AND VOMITING THAT IS NOT CONTROLLED WITH YOUR NAUSEA MEDICATION *UNUSUAL SHORTNESS OF BREATH *UNUSUAL BRUISING OR BLEEDING *URINARY PROBLEMS (pain or burning when urinating, or frequent urination) *BOWEL PROBLEMS (unusual diarrhea, constipation, pain near the anus) TENDERNESS IN MOUTH AND THROAT WITH OR WITHOUT PRESENCE OF ULCERS (sore throat, sores in mouth, or a toothache) UNUSUAL RASH, SWELLING OR PAIN  UNUSUAL VAGINAL DISCHARGE OR ITCHING   Items with * indicate a potential emergency and should be followed up as soon as possible or go to the Emergency Department if any problems should occur.  Please show the CHEMOTHERAPY ALERT CARD or IMMUNOTHERAPY ALERT CARD at check-in to the  Emergency Department and triage nurse.  Should you have questions after your visit or need to cancel or reschedule your appointment, please contact [facility]  Dept: [phone]  and follow the prompts.  Office hours are 8:00 a.m. to 4:30 p.m. Monday - Friday. Please note that voicemails left after 4:00 p.m. may not be returned until the following business day.  We are closed weekends and major holidays. You have access to a nurse at all times for urgent questions. Please call the main number to the clinic Dept: [phone] and follow the prompts.  For any non-urgent questions, you may also contact your provider using MyChart. We now offer e-Visits for anyone 42 and older to request care online for non-urgent symptoms. For details visit [email or web address].   Also download the MyChart app! Go to the app store, search "MyChart", open the app, select [facility], and log in with your MyChart username and password.  Due to Covid, a mask is required upon entering the hospital/clinic. If you do not have a mask, one will be given to you upon arrival. For doctor visits, patients may have 1 support person aged 76 or older with them. For treatment visits, patients cannot have anyone with them due to current Covid guidelines and our immunocompromised population.

## 2021-05-26 NOTE — Progress Notes (Signed)
1700: PT STABLE AT TIME OF DISCHARGE

## 2021-05-29 ENCOUNTER — Encounter: Payer: Self-pay | Admitting: Oncology

## 2021-06-01 NOTE — Progress Notes (Signed)
Sent in request for DOS 08/18 and 08/19 to [facility].

## 2021-06-08 NOTE — Progress Notes (Signed)
[facility]  [address] [place],  [place]  [id number] [phone]  Clinic Day:  06/15/2021  Referring physician: Marguerita Merles, MD  This document serves as a record of services personally performed by Charmel Pronovost Macarthur Critchley, MD. It was created on their behalf by [facility] E, a trained medical scribe. The creation of this record is based on the scribe's personal observations and the provider's statements to them.  HISTORY OF PRESENT ILLNESS:  The patient is a 70 y.o. 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  A PET scan also showed a hypermetabolic subcarinal lymph node.  She comes in today to go over all of her scans to ascertain her new disease baseline after receiving 4 cycles of Enhertu.  The patient states that she tolerated her 4th cycle fairly well.  She has occasional nausea, which is relieved by her antiemetics.  As it pertains to her previously metastatic CNS disease, she denies having any significant headaches, balance or vision problems which concern her for early disease recurrence.     VITALS:  Blood pressure 117/65, pulse 75, temperature 97.8 F (36.6 C), resp. rate 16, height 5' 1"  (1.549 m), weight 153 lb 6.4 oz (69.6 kg), SpO2 95 %.  Wt Readings from Last 3 Encounters:  06/15/21 153 lb 6.4 oz (69.6 kg)  05/26/21 155 lb (70.3 kg)  05/25/21 155 lb 14.4 oz (70.7 kg)    Body mass index is 28.98 kg/m.  Performance status (ECOG): 1 - Symptomatic but completely ambulatory  PHYSICAL EXAM:  Physical Exam Constitutional:      General: She is not in acute distress.    Appearance: Normal appearance. She is normal weight.  HENT:     Head: Normocephalic and atraumatic.  Eyes:     General: No scleral icterus.    Extraocular Movements: Extraocular movements intact.     Conjunctiva/sclera: Conjunctivae normal.     Pupils: Pupils are  equal, round, and reactive to light.  Cardiovascular:     Rate and Rhythm: Normal rate and regular rhythm.     Pulses: Normal pulses.     Heart sounds: Normal heart sounds. No murmur heard.   No friction rub. No gallop.  Pulmonary:     Effort: Pulmonary effort is normal. No respiratory distress.     Breath sounds: Normal breath sounds.  Abdominal:     General: Bowel sounds are normal. There is no distension.     Palpations: Abdomen is soft. There is no hepatomegaly, splenomegaly or mass.     Tenderness: There is no abdominal tenderness.  Musculoskeletal:        General: Normal range of motion.     Cervical back: Normal range of motion and neck supple.     Right lower leg: No edema.     Left lower leg: No edema.  Lymphadenopathy:     Cervical: No cervical adenopathy.  Skin:    General: Skin is warm and dry.  Neurological:     General: No focal deficit present.     Mental Status: She is alert and oriented to person, place, and time. Mental status is at baseline.  Psychiatric:        Mood and Affect: Mood normal.        Behavior: Behavior normal.        Thought Content: Thought content normal.  Judgment: Judgment normal.   SCANS:  Her scans done yesterday revealed the following: FINDINGS: CT CHEST FINDINGS  Cardiovascular: The heart size is normal. No substantial pericardial effusion. No discernible atherosclerotic calcification in the thoracic aorta. Right-sided Port-A-Cath tip is positioned in the proximal SVC.  Mediastinum/Nodes: 15 mm short axis subcarinal node seen on previous PET-CT has decreased in the interval measuring 9 mm short axis today on image 23/2. No other mediastinal lymphadenopathy There is no hilar lymphadenopathy. Moderate hiatal hernia. The esophagus has normal imaging features. There is no axillary lymphadenopathy.  Lungs/Pleura: Several tiny bilateral pulmonary nodules are again noted, dominant of which measures 4 mm in the right middle lobe  on image 51/4, stable since chest CT 11/09/2020. No new suspicious nodule or mass. There is some atelectasis in the dependent lung bases. No pleural effusion.  Musculoskeletal: No worrisome lytic or sclerotic osseous abnormality.  CT ABDOMEN PELVIS FINDINGS  Hepatobiliary: Multiple hepatic cysts are stable since recent PET-CT. There is no evidence for gallstones, gallbladder wall thickening, or pericholecystic fluid. No intrahepatic or extrahepatic biliary dilation.  Pancreas: No focal mass lesion. No dilatation of the main duct. No intraparenchymal cyst. No peripancreatic edema.  Spleen: No splenomegaly. No focal mass lesion.  Adrenals/Urinary Tract: No adrenal nodule or mass. Central sinus cysts again noted in both kidneys. 11 mm low-density lesion in the medial interpolar right kidney (26/5) has attenuation too high to be a simple cyst. Additional tiny cortical hypodensities in both kidneys are too small to characterize but likely benign. No evidence for hydroureter. Bladder is nondistended.  Stomach/Bowel: Moderate hiatal hernia. Duodenum is normally positioned as is the ligament of Treitz. Duodenal diverticulum noted. No small bowel wall thickening. No small bowel dilatation. The terminal ileum is normal. The terminal ileum is normal. The appendix is not well visualized, but there is no edema or inflammation in the region of the cecum. No gross colonic mass. No colonic wall thickening. Diverticular changes are noted in the left colon without evidence of diverticulitis.  Vascular/Lymphatic: No atherosclerotic calcification in the abdominal aorta. There is no gastrohepatic or hepatoduodenal ligament lymphadenopathy. No retroperitoneal or mesenteric lymphadenopathy. No pelvic sidewall lymphadenopathy.  Reproductive: Unremarkable.  Other: No intraperitoneal free fluid.  Musculoskeletal: Stable tiny sclerotic foci in the femoral heads comparing back to a 2016 exam  consistent with bone islands. No worrisome lytic or sclerotic osseous abnormality.  IMPRESSION: 1. Interval decrease in size of the subcarinal lymph node noted to be hypermetabolic on previous PET-CT. No new or progressive findings in the chest, abdomen, or pelvis to suggest new sites of metastatic involvement. 2. Stable tiny bilateral pulmonary nodules, dominant of which measures 4 mm in the right middle lobe, stable since 11/09/2020. These are likely benign but attention on follow-up recommended. 3. Moderate hiatal hernia. 4. Multiple hepatic cysts. 5. 11 mm low-density lesion in the medial interpolar right kidney has attenuation too high to be a simple cyst. Attention on follow-up recommended. 6. Left colonic diverticulosis without diverticulitis. -------------------------------------------------------------------------------------------------- FINDINGS: Brain: Status post left suboccipital craniotomy with subjacent resection cavity, which has decreased in size compared to the prior exam. Decreased linear enhancement about the resection cavity and decreased blood products within the resection cavity. No new or nodular enhancement. Decreased surrounding T2 hyperintense signal.  Decreased small fluid collection subjacent to the craniotomy site. No new extra-axial collection. No acute infarct, mass, mass effect, or midline shift. Unchanged size and configuration of the ventricular system.  Vascular: Normal flow voids.  Skull and upper cervical  spine: Status post left suboccipital craniotomy. Otherwise normal marrow signal.  Sinuses/Orbits: Negative.  Other: None.  IMPRESSION: Status post left suboccipital craniotomy with interval decrease in the size the resection cavity, decreased enhancement about the resection cavity, and decreased surrounding edema. No new or nodular enhancement to suggest additional metastatic disease.  LABS:   CBC Latest Ref Rng & Units 06/13/2021  05/25/2021 05/04/2021  WBC - 4.1 3.2 2.8  Hemoglobin 12.0 - 16.0 11.6(A) 11.5(A) 12.2  Hematocrit 36 - 46 34(A) 35(A) 37  Platelets 150 - 399 230 226 228   CMP Latest Ref Rng & Units 06/13/2021 05/25/2021 05/04/2021  Glucose 70 - 99 mg/dL - - -  BUN 4 - 21 9 19 10   Creatinine 0.5 - 1.1 0.6 0.6 0.7  Sodium 137 - 147 138 139 139  Potassium 3.4 - 5.3 3.8 4.0 4.0  Chloride 99 - 108 106 107 107  CO2 13 - 22 22 23(A) 23(A)  Calcium 8.7 - 10.7 9.6 9.5 9.3  Total Protein 6.5 - 8.1 g/dL - - -  Total Bilirubin 0.3 - 1.2 mg/dL - - -  Alkaline Phos 25 - 125 163(A) 109 103  AST 13 - 35 50(A) 54(A) 48(A)  ALT 7 - 35 33 48(A) 40(A)    ASSESSMENT & PLAN:  Assessment/Plan:  A 70 y.o. 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  She also has subcarinal nodal disease.  In clinic today, I went over all of her scans with her, for which she could see that her subcarinal lymph node has decreased in size.  There remain no new sites of disease.  Her brain MRI also showed no new lesions.  Clinically, she appears to be doing very well.  She will proceed with her 5th cycle of Enhertu tomorrow.  I will see her back in 3 weeks before she heads into her 6th cycle of treatment.  The patient understands all the plans discussed today and is in agreement with them.    I, Rita Ohara, am acting as scribe for Marice Potter, MD    I have reviewed this report as typed by the medical scribe, and it is complete and accurate.  [place] Hibberd Macarthur Critchley, MD

## 2021-06-13 DIAGNOSIS — T451X1D Poisoning by antineoplastic and immunosuppressive drugs, accidental (unintentional), subsequent encounter: Secondary | ICD-10-CM

## 2021-06-13 DIAGNOSIS — C50912 Malignant neoplasm of unspecified site of left female breast: Secondary | ICD-10-CM

## 2021-06-13 LAB — CBC: RBC: 3.62 — AB (ref 3.87–5.11)

## 2021-06-13 LAB — BASIC METABOLIC PANEL
BUN: 9 (ref 4–21)
CO2: 22 (ref 13–22)
Chloride: 106 (ref 99–108)
Creatinine: 0.6 (ref 0.5–1.1)
Glucose: 100
Potassium: 3.8 (ref 3.4–5.3)
Sodium: 138 (ref 137–147)

## 2021-06-13 LAB — HEPATIC FUNCTION PANEL
ALT: 33 (ref 7–35)
AST: 50 — AB (ref 13–35)
Alkaline Phosphatase: 163 — AB (ref 25–125)
Bilirubin, Total: 0.5

## 2021-06-13 LAB — CBC AND DIFFERENTIAL
HCT: 34 — AB (ref 36–46)
Hemoglobin: 11.6 — AB (ref 12.0–16.0)
Neutrophils Absolute: 2.87
Platelets: 230 (ref 150–399)
WBC: 4.1

## 2021-06-13 LAB — COMPREHENSIVE METABOLIC PANEL
Albumin: 4 (ref 3.5–5.0)
Calcium: 9.6 (ref 8.7–10.7)

## 2021-06-14 ENCOUNTER — Encounter: Payer: Self-pay | Admitting: Hematology and Oncology

## 2021-06-15 ENCOUNTER — Telehealth: Payer: Self-pay | Admitting: Oncology

## 2021-06-15 ENCOUNTER — Other Ambulatory Visit: Payer: Self-pay

## 2021-06-15 ENCOUNTER — Encounter: Payer: Self-pay | Admitting: Oncology

## 2021-06-15 ENCOUNTER — Other Ambulatory Visit: Payer: Self-pay | Admitting: Oncology

## 2021-06-15 ENCOUNTER — Inpatient Hospital Stay: Payer: Self-pay | Attending: Oncology | Admitting: Oncology

## 2021-06-15 VITALS — BP 117/65 | HR 75 | Temp 97.8°F | Resp 16 | Ht 61.0 in | Wt 153.4 lb

## 2021-06-15 DIAGNOSIS — C7931 Secondary malignant neoplasm of brain: Secondary | ICD-10-CM | POA: Insufficient documentation

## 2021-06-15 DIAGNOSIS — C50912 Malignant neoplasm of unspecified site of left female breast: Secondary | ICD-10-CM

## 2021-06-15 DIAGNOSIS — Z79899 Other long term (current) drug therapy: Secondary | ICD-10-CM | POA: Insufficient documentation

## 2021-06-15 DIAGNOSIS — Z5112 Encounter for antineoplastic immunotherapy: Secondary | ICD-10-CM | POA: Insufficient documentation

## 2021-06-15 DIAGNOSIS — Z171 Estrogen receptor negative status [ER-]: Secondary | ICD-10-CM | POA: Insufficient documentation

## 2021-06-15 DIAGNOSIS — C50412 Malignant neoplasm of upper-outer quadrant of left female breast: Secondary | ICD-10-CM | POA: Insufficient documentation

## 2021-06-15 MED ORDER — PROCHLORPERAZINE MALEATE 10 MG PO TABS: 10.0000 mg | ORAL_TABLET | Freq: Four times a day (QID) | ORAL | 3 refills | Status: DC | PRN

## 2021-06-15 NOTE — Telephone Encounter (Signed)
Per 8/18 LOS next appt scheduled and given to patient

## 2021-06-15 NOTE — Addendum Note (Signed)
Addended by: Lavera Guise A on: 06/15/2021 05:44 PM   Modules accepted: Orders

## 2021-06-16 ENCOUNTER — Inpatient Hospital Stay: Payer: Self-pay

## 2021-06-16 VITALS — BP 116/72 | HR 66 | Temp 98.8°F | Resp 18 | Ht 61.0 in | Wt 154.0 lb

## 2021-06-16 DIAGNOSIS — Z171 Estrogen receptor negative status [ER-]: Secondary | ICD-10-CM

## 2021-06-16 MED ORDER — SODIUM CHLORIDE 0.9% FLUSH
10.0000 mL | INTRAVENOUS | Status: DC | PRN
  Administered 2021-06-16: 10 mL

## 2021-06-16 MED ORDER — DIPHENHYDRAMINE HCL 25 MG PO CAPS
50.0000 mg | ORAL_CAPSULE | Freq: Once | ORAL | Status: AC
Start: 2021-06-16 — End: 2021-06-16
  Administered 2021-06-16: 50 mg via ORAL
  Filled 2021-06-16: qty 2

## 2021-06-16 MED ORDER — DEXTROSE 5 % IV SOLN
5.4000 mg/kg | Freq: Once | INTRAVENOUS | Status: AC
  Administered 2021-06-16: 380 mg via INTRAVENOUS
  Filled 2021-06-16: qty 19

## 2021-06-16 MED ORDER — SODIUM CHLORIDE 0.9 % IV SOLN
10.0000 mg | Freq: Once | INTRAVENOUS | Status: AC
  Administered 2021-06-16: 10 mg via INTRAVENOUS
  Filled 2021-06-16: qty 10

## 2021-06-16 MED ORDER — HEPARIN SOD (PORK) LOCK FLUSH 100 UNIT/ML IV SOLN
500.0000 [IU] | Freq: Once | INTRAVENOUS | Status: AC | PRN
  Administered 2021-06-16: 500 [IU]

## 2021-06-16 MED ORDER — DEXTROSE 5 % IV SOLN: Freq: Once | INTRAVENOUS | Status: AC

## 2021-06-16 MED ORDER — DIPHENHYDRAMINE HCL 25 MG PO CAPS
ORAL_CAPSULE | ORAL | Status: AC
  Filled 2021-06-16: qty 1

## 2021-06-16 MED ORDER — PALONOSETRON HCL INJECTION 0.25 MG/5ML
0.2500 mg | Freq: Once | INTRAVENOUS | Status: AC
  Administered 2021-06-16: 0.25 mg via INTRAVENOUS
  Filled 2021-06-16: qty 5

## 2021-06-16 MED ORDER — ACETAMINOPHEN 325 MG PO TABS
650.0000 mg | ORAL_TABLET | Freq: Once | ORAL | Status: AC
  Administered 2021-06-16: 650 mg via ORAL
  Filled 2021-06-16: qty 2

## 2021-06-16 NOTE — Progress Notes (Signed)
1515: PT STABLE AT TIME OF DISCHARGE

## 2021-06-16 NOTE — Patient Instructions (Signed)
[facility]  Discharge Instructions: Thank you for choosing [facility] to provide your oncology and hematology care.  If you have a lab appointment with the [facility], please go directly to the [facility] and check in at the registration area.   Wear comfortable clothing and clothing appropriate for easy access to any Portacath or PICC line.   We strive to give you quality time with your provider. You may need to reschedule your appointment if you arrive late (15 or more minutes).  Arriving late affects you and other patients whose appointments are after yours.  Also, if you miss three or more appointments without notifying the office, you may be dismissed from the clinic at the provider's discretion.      For prescription refill requests, have your pharmacy contact our office and allow 72 hours for refills to be completed.    Today you received the following chemotherapy and/or immunotherapy agents Trastuzumab    To help prevent nausea and vomiting after your treatment, we encourage you to take your nausea medication as directed.  BELOW ARE SYMPTOMS THAT SHOULD BE REPORTED IMMEDIATELY: *FEVER GREATER THAN 100.4 F (38 C) OR HIGHER *CHILLS OR SWEATING *NAUSEA AND VOMITING THAT IS NOT CONTROLLED WITH YOUR NAUSEA MEDICATION *UNUSUAL SHORTNESS OF BREATH *UNUSUAL BRUISING OR BLEEDING *URINARY PROBLEMS (pain or burning when urinating, or frequent urination) *BOWEL PROBLEMS (unusual diarrhea, constipation, pain near the anus) TENDERNESS IN MOUTH AND THROAT WITH OR WITHOUT PRESENCE OF ULCERS (sore throat, sores in mouth, or a toothache) UNUSUAL RASH, SWELLING OR PAIN  UNUSUAL VAGINAL DISCHARGE OR ITCHING   Items with * indicate a potential emergency and should be followed up as soon as possible or go to the Emergency Department if any problems should occur.  Please show the CHEMOTHERAPY ALERT CARD or IMMUNOTHERAPY ALERT CARD at check-in to the  Emergency Department and triage nurse.  Should you have questions after your visit or need to cancel or reschedule your appointment, please contact [facility]  Dept: [phone]  and follow the prompts.  Office hours are 8:00 a.m. to 4:30 p.m. Monday - Friday. Please note that voicemails left after 4:00 p.m. may not be returned until the following business day.  We are closed weekends and major holidays. You have access to a nurse at all times for urgent questions. Please call the main number to the clinic Dept: [phone] and follow the prompts.  For any non-urgent questions, you may also contact your provider using MyChart. We now offer e-Visits for anyone 19 and older to request care online for non-urgent symptoms. For details visit [email or web address].   Also download the MyChart app! Go to the app store, search "MyChart", open the app, select [facility], and log in with your MyChart username and password.  Due to Covid, a mask is required upon entering the hospital/clinic. If you do not have a mask, one will be given to you upon arrival. For doctor visits, patients may have 1 support person aged 36 or older with them. For treatment visits, patients cannot have anyone with them due to current Covid guidelines and our immunocompromised population.

## 2021-06-20 ENCOUNTER — Encounter: Payer: Self-pay | Admitting: Oncology

## 2021-06-21 ENCOUNTER — Encounter: Payer: Self-pay | Admitting: Hematology and Oncology

## 2021-06-28 NOTE — Progress Notes (Signed)
[name]  [address] [place],  [place]  [address] [phone]  Clinic Day:  07/06/2021  Referring physician: Marguerita Merles, MD  This document serves as a record of services personally performed by [name], MD. It was created on their behalf by [facility] E, a trained medical scribe. The creation of this record is based on the scribe's personal observations and the provider's statements to them.  HISTORY OF PRESENT ILLNESS:  The patient is a 70 y.o. 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  She also has a subcarinal lymph node.  Recent scans showed a positive response to therapy.  She comes in today prior to her 6th cycle of Enhertu.  The patient states that she tolerated her 5th cycle okay. However, she is beginning to have more nausea and weakness with her Enhertu therapy.  As it pertains to her previously metastatic CNS disease, she denies having any significant headaches, balance or vision problems which concern her for early disease recurrence.     VITALS:  Blood pressure 131/71, pulse 74, temperature 97.8 F (36.6 C), resp. rate 14, height 5' 1"  (1.549 m), weight 153 lb 1.6 oz (69.4 kg), SpO2 96 %.  Wt Readings from Last 3 Encounters:  07/07/21 152 lb 4 oz (69.1 kg)  07/06/21 153 lb 1.6 oz (69.4 kg)  06/16/21 154 lb (69.9 kg)    Body mass index is 28.93 kg/m.  Performance status (ECOG): 1 - Symptomatic but completely ambulatory  PHYSICAL EXAM:  Physical Exam Constitutional:      General: She is not in acute distress.    Appearance: Normal appearance. She is normal weight.  HENT:     Head: Normocephalic and atraumatic.  Eyes:     General: No scleral icterus.    Extraocular Movements: Extraocular movements intact.     Conjunctiva/sclera: Conjunctivae normal.     Pupils: Pupils are equal, round, and reactive to light.   Cardiovascular:     Rate and Rhythm: Normal rate and regular rhythm.     Pulses: Normal pulses.     Heart sounds: Normal heart sounds. No murmur heard.   No friction rub. No gallop.  Pulmonary:     Effort: Pulmonary effort is normal. No respiratory distress.     Breath sounds: Normal breath sounds.  Abdominal:     General: Bowel sounds are normal. There is no distension.     Palpations: Abdomen is soft. There is no hepatomegaly, splenomegaly or mass.     Tenderness: There is no abdominal tenderness.  Musculoskeletal:        General: Normal range of motion.     Cervical back: Normal range of motion and neck supple.     Right lower leg: No edema.     Left lower leg: No edema.  Lymphadenopathy:     Cervical: No cervical adenopathy.  Skin:    General: Skin is warm and dry.  Neurological:     General: No focal deficit present.     Mental Status: She is alert and oriented to person, place, and time. Mental status is at baseline.  Psychiatric:        Mood and Affect: Mood normal.        Behavior: Behavior normal.        Thought Content: Thought content normal.        Judgment: Judgment normal.  LABS:   CBC Latest Ref Rng & Units 07/06/2021 06/13/2021 05/25/2021  WBC - 3.2 4.1 3.2  Hemoglobin 12.0 - 16.0 12.1 11.6(A) 11.5(A)  Hematocrit 36 - 46 36 34(A) 35(A)  Platelets 150 - 399 189 230 226   CMP Latest Ref Rng & Units 07/06/2021 06/13/2021 05/25/2021  Glucose 70 - 99 mg/dL - - -  BUN 4 - 21 9 9 19   Creatinine 0.5 - 1.1 0.6 0.6 0.6  Sodium 137 - 147 141 138 139  Potassium 3.4 - 5.3 3.9 3.8 4.0  Chloride 99 - 108 107 106 107  CO2 13 - 22 25(A) 22 23(A)  Calcium 8.7 - 10.7 9.7 9.6 9.5  Total Protein 6.5 - 8.1 g/dL - - -  Total Bilirubin 0.3 - 1.2 mg/dL - - -  Alkaline Phos 25 - 125 147(A) 163(A) 109  AST 13 - 35 53(A) 50(A) 54(A)  ALT 7 - 35 34 33 48(A)    ASSESSMENT & PLAN:  Assessment/Plan:  A 70 y.o. 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  She also has subcarinal nodal disease.  She will proceed with her 6th cycle of Enhertu tomorrow.  However, due to her having increased nausea and weakness from her Enhertu therapy, her dose will be decreased by 25%.  Overall, she appears to be doing okay.  I will see her back in 3 weeks before she heads into her 7th cycle of treatment.  The patient understands all the plans discussed today and is in agreement with them.    I, Rita Ohara, am acting as scribe for Marice Potter, MD    I have reviewed this report as typed by the medical scribe, and it is complete and accurate.  [name], MD

## 2021-07-04 NOTE — Progress Notes (Signed)
Sent in request for DOS 09/08 and 09/09 to [employer].

## 2021-07-06 ENCOUNTER — Inpatient Hospital Stay: Payer: Self-pay | Attending: Oncology | Admitting: Oncology

## 2021-07-06 ENCOUNTER — Other Ambulatory Visit: Payer: Self-pay | Admitting: Hematology and Oncology

## 2021-07-06 ENCOUNTER — Encounter: Payer: Self-pay | Admitting: Oncology

## 2021-07-06 ENCOUNTER — Inpatient Hospital Stay: Payer: Self-pay

## 2021-07-06 ENCOUNTER — Other Ambulatory Visit: Payer: Self-pay

## 2021-07-06 VITALS — BP 131/71 | HR 74 | Temp 97.8°F | Resp 14 | Ht 61.0 in | Wt 153.1 lb

## 2021-07-06 DIAGNOSIS — C50412 Malignant neoplasm of upper-outer quadrant of left female breast: Secondary | ICD-10-CM

## 2021-07-06 DIAGNOSIS — Z79899 Other long term (current) drug therapy: Secondary | ICD-10-CM | POA: Insufficient documentation

## 2021-07-06 DIAGNOSIS — Z5112 Encounter for antineoplastic immunotherapy: Secondary | ICD-10-CM | POA: Insufficient documentation

## 2021-07-06 DIAGNOSIS — C7931 Secondary malignant neoplasm of brain: Secondary | ICD-10-CM | POA: Insufficient documentation

## 2021-07-06 DIAGNOSIS — Z171 Estrogen receptor negative status [ER-]: Secondary | ICD-10-CM | POA: Insufficient documentation

## 2021-07-06 LAB — HEPATIC FUNCTION PANEL
ALT: 34 (ref 7–35)
AST: 53 — AB (ref 13–35)
Alkaline Phosphatase: 147 — AB (ref 25–125)
Bilirubin, Total: 0.4

## 2021-07-06 LAB — CBC AND DIFFERENTIAL
HCT: 36 (ref 36–46)
Hemoglobin: 12.1 (ref 12.0–16.0)
Neutrophils Absolute: 1.79
Platelets: 189 (ref 150–399)
WBC: 3.2

## 2021-07-06 LAB — BASIC METABOLIC PANEL
BUN: 9 (ref 4–21)
CO2: 25 — AB (ref 13–22)
Chloride: 107 (ref 99–108)
Creatinine: 0.6 (ref 0.5–1.1)
Glucose: 102
Potassium: 3.9 (ref 3.4–5.3)
Sodium: 141 (ref 137–147)

## 2021-07-06 LAB — CBC
MCV: 95 (ref 81–99)
RBC: 3.73 — AB (ref 3.87–5.11)

## 2021-07-06 LAB — COMPREHENSIVE METABOLIC PANEL
Albumin: 4.1 (ref 3.5–5.0)
Calcium: 9.7 (ref 8.7–10.7)

## 2021-07-06 MED FILL — Fam-Trastuzumab Deruxtecan-nxki For IV Soln 100 MG: INTRAVENOUS | Qty: 19 | Status: AC

## 2021-07-07 ENCOUNTER — Inpatient Hospital Stay: Payer: Self-pay

## 2021-07-07 VITALS — BP 106/64 | HR 67 | Temp 97.7°F | Resp 18 | Ht 61.0 in | Wt 152.2 lb

## 2021-07-07 DIAGNOSIS — C50412 Malignant neoplasm of upper-outer quadrant of left female breast: Secondary | ICD-10-CM

## 2021-07-07 MED ORDER — FAM-TRASTUZUMAB DERUXTECAN-NXKI CHEMO 100 MG IV SOLR
4.2500 mg/kg | Freq: Once | INTRAVENOUS | Status: AC
  Administered 2021-07-07: 300 mg via INTRAVENOUS
  Filled 2021-07-07: qty 15

## 2021-07-07 MED ORDER — HEPARIN SOD (PORK) LOCK FLUSH 100 UNIT/ML IV SOLN
500.0000 [IU] | Freq: Once | INTRAVENOUS | Status: AC | PRN
  Administered 2021-07-07: 500 [IU]

## 2021-07-07 MED ORDER — ACETAMINOPHEN 325 MG PO TABS
650.0000 mg | ORAL_TABLET | Freq: Once | ORAL | Status: AC
  Administered 2021-07-07: 650 mg via ORAL
  Filled 2021-07-07: qty 2

## 2021-07-07 MED ORDER — SODIUM CHLORIDE 0.9 % IV SOLN
10.0000 mg | Freq: Once | INTRAVENOUS | Status: AC
  Administered 2021-07-07: 10 mg via INTRAVENOUS
  Filled 2021-07-07: qty 1

## 2021-07-07 MED ORDER — DEXTROSE 5 % IV SOLN: Freq: Once | INTRAVENOUS | Status: AC

## 2021-07-07 MED ORDER — PALONOSETRON HCL INJECTION 0.25 MG/5ML
0.2500 mg | Freq: Once | INTRAVENOUS | Status: AC
  Administered 2021-07-07: 0.25 mg via INTRAVENOUS
  Filled 2021-07-07: qty 5

## 2021-07-07 MED ORDER — DIPHENHYDRAMINE HCL 25 MG PO CAPS
50.0000 mg | ORAL_CAPSULE | Freq: Once | ORAL | Status: AC
  Administered 2021-07-07: 50 mg via ORAL
  Filled 2021-07-07: qty 2

## 2021-07-07 MED ORDER — SODIUM CHLORIDE 0.9% FLUSH
10.0000 mL | INTRAVENOUS | Status: DC | PRN
  Administered 2021-07-07: 10 mL

## 2021-07-07 NOTE — Progress Notes (Signed)
1513: PT STABLE AT TIME OF DISCHARGE

## 2021-07-07 NOTE — Patient Instructions (Signed)
[facility]  Discharge Instructions: Thank you for choosing [facility] to provide your oncology and hematology care.  If you have a lab appointment with the [facility], please go directly to the [facility] and check in at the registration area.   Wear comfortable clothing and clothing appropriate for easy access to any Portacath or PICC line.   We strive to give you quality time with your provider. You may need to reschedule your appointment if you arrive late (15 or more minutes).  Arriving late affects you and other patients whose appointments are after yours.  Also, if you miss three or more appointments without notifying the office, you may be dismissed from the clinic at the provider's discretion.      For prescription refill requests, have your pharmacy contact our office and allow 72 hours for refills to be completed.    Today you received the following chemotherapy and/or immunotherapy agents Fam Trastuzumab    To help prevent nausea and vomiting after your treatment, we encourage you to take your nausea medication as directed.  BELOW ARE SYMPTOMS THAT SHOULD BE REPORTED IMMEDIATELY: *FEVER GREATER THAN 100.4 F (38 C) OR HIGHER *CHILLS OR SWEATING *NAUSEA AND VOMITING THAT IS NOT CONTROLLED WITH YOUR NAUSEA MEDICATION *UNUSUAL SHORTNESS OF BREATH *UNUSUAL BRUISING OR BLEEDING *URINARY PROBLEMS (pain or burning when urinating, or frequent urination) *BOWEL PROBLEMS (unusual diarrhea, constipation, pain near the anus) TENDERNESS IN MOUTH AND THROAT WITH OR WITHOUT PRESENCE OF ULCERS (sore throat, sores in mouth, or a toothache) UNUSUAL RASH, SWELLING OR PAIN  UNUSUAL VAGINAL DISCHARGE OR ITCHING   Items with * indicate a potential emergency and should be followed up as soon as possible or go to the Emergency Department if any problems should occur.  Please show the CHEMOTHERAPY ALERT CARD or IMMUNOTHERAPY ALERT CARD at check-in to the  Emergency Department and triage nurse.  Should you have questions after your visit or need to cancel or reschedule your appointment, please contact [facility]  Dept: [phone]  and follow the prompts.  Office hours are 8:00 a.m. to 4:30 p.m. Monday - Friday. Please note that voicemails left after 4:00 p.m. may not be returned until the following business day.  We are closed weekends and major holidays. You have access to a nurse at all times for urgent questions. Please call the main number to the clinic Dept: [phone] and follow the prompts.  For any non-urgent questions, you may also contact your provider using MyChart. We now offer e-Visits for anyone 50 and older to request care online for non-urgent symptoms. For details visit [email or web address].   Also download the MyChart app! Go to the app store, search "MyChart", open the app, select [facility], and log in with your MyChart username and password.  Due to Covid, a mask is required upon entering the hospital/clinic. If you do not have a mask, one will be given to you upon arrival. For doctor visits, patients may have 1 support person aged 19 or older with them. For treatment visits, patients cannot have anyone with them due to current Covid guidelines and our immunocompromised population.

## 2021-07-13 NOTE — Progress Notes (Signed)
Sent in request for DOS 09/29 and 09/30 to [employer].

## 2021-07-16 ENCOUNTER — Encounter: Payer: Self-pay | Admitting: Oncology

## 2021-07-17 ENCOUNTER — Encounter: Payer: Self-pay | Admitting: Oncology

## 2021-07-26 NOTE — Progress Notes (Signed)
[facility]  [address] [place],    [address] [phone]  Clinic Day:  07/27/2021  Referring physician: Marguerita Merles, MD  This document serves as a record of services personally performed by Teresa Macarthur Critchley, MD. It was created on their behalf by [name] E, a trained medical scribe. The creation of this record is based on the scribe's personal observations and the provider's statements to them.  HISTORY OF PRESENT ILLNESS:  The patient is a 70 y.o. 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  She also has a subcarinal lymph node for which scans have shown a positive response to therapy.  She comes in today prior to her 7th cycle of Enhertu.  The patient states that she tolerated her 6th cycle much better after her Enhertu dose was dose was reduced by 20%.  As it pertains to her previously metastatic CNS disease, she denies having any significant headaches, balance or vision problems which concern her for early disease recurrence.     VITALS:  Blood pressure 128/73, pulse 63, temperature 98 F (36.7 C), resp. rate 16, height 5' 1"  (1.549 m), weight 151 lb 11.2 oz (68.8 kg), SpO2 97 %.  Wt Readings from Last 3 Encounters:  07/27/21 151 lb 11.2 oz (68.8 kg)  07/07/21 152 lb 4 oz (69.1 kg)  07/06/21 153 lb 1.6 oz (69.4 kg)    Body mass index is 28.66 kg/m.  Performance status (ECOG): 1 - Symptomatic but completely ambulatory  PHYSICAL EXAM:  Physical Exam Constitutional:      General: She is not in acute distress.    Appearance: Normal appearance. She is normal weight.  HENT:     Head: Normocephalic and atraumatic.  Eyes:     General: No scleral icterus.    Extraocular Movements: Extraocular movements intact.     Conjunctiva/sclera: Conjunctivae normal.     Pupils: Pupils are equal, round, and reactive to light.   Cardiovascular:     Rate and Rhythm: Normal rate and regular rhythm.     Pulses: Normal pulses.     Heart sounds: Normal heart sounds. No murmur heard.   No friction rub. No gallop.  Pulmonary:     Effort: Pulmonary effort is normal. No respiratory distress.     Breath sounds: Normal breath sounds.  Abdominal:     General: Bowel sounds are normal. There is no distension.     Palpations: Abdomen is soft. There is no hepatomegaly, splenomegaly or mass.     Tenderness: There is no abdominal tenderness.  Musculoskeletal:        General: Normal range of motion.     Cervical back: Normal range of motion and neck supple.     Right lower leg: No edema.     Left lower leg: No edema.  Lymphadenopathy:     Cervical: No cervical adenopathy.  Skin:    General: Skin is warm and dry.  Neurological:     General: No focal deficit present.     Mental Status: She is alert and oriented to person, place, and time. Mental status is at baseline.  Psychiatric:        Mood and Affect: Mood normal.        Behavior: Behavior normal.        Thought Content: Thought content normal.        Judgment: Judgment normal.  LABS:   CBC Latest Ref Rng & Units 07/27/2021 07/06/2021 06/13/2021  WBC - 3.2 3.2 4.1  Hemoglobin 12.0 - 16.0 12.1 12.1 11.6(A)  Hematocrit 36 - 46 36 36 34(A)  Platelets 150 - 399 170 189 230   CMP Latest Ref Rng & Units 07/27/2021 07/06/2021 06/13/2021  Glucose 70 - 99 mg/dL - - -  BUN 4 - 21 10 9 9   Creatinine 0.5 - 1.1 1.0 0.6 0.6  Sodium 137 - 147 141 141 138  Potassium 3.4 - 5.3 3.9 3.9 3.8  Chloride 99 - 108 108 107 106  CO2 13 - 22 22 25(A) 22  Calcium 8.7 - 10.7 9.4 9.7 9.6  Total Protein 6.5 - 8.1 g/dL - - -  Total Bilirubin 0.3 - 1.2 mg/dL - - -  Alkaline Phos 25 - 125 139(A) 147(A) 163(A)  AST 13 - 35 52(A) 53(A) 50(A)  ALT 7 - 35 31 34 33    ASSESSMENT & PLAN:  Assessment/Plan:  A 70 y.o. female with metastatic HER2 Neu receptor positive breast cancer, status post a  suboccipital brain metastatecomy in April 2022.  She also has subcarinal nodal disease.  She will proceed with her 7th cycle of Enhertu early next week.  Clinically, he patient is doing well, especially since she has had the 25% dose reduction with her Enhertu therapy.  I will see her back in 3 weeks before she heads into her 8th cycle of treatment.  The patient understands all the plans discussed today and is in agreement with them.    I, Rita Ohara, am acting as scribe for Teresa Potter, MD    I have reviewed this report as typed by the medical scribe, and it is complete and accurate.  Teresa Macarthur Critchley, MD

## 2021-07-27 ENCOUNTER — Inpatient Hospital Stay (HOSPITAL_BASED_OUTPATIENT_CLINIC_OR_DEPARTMENT_OTHER): Payer: Self-pay | Admitting: Oncology

## 2021-07-27 ENCOUNTER — Other Ambulatory Visit: Payer: Self-pay

## 2021-07-27 ENCOUNTER — Other Ambulatory Visit: Payer: Self-pay | Admitting: Hematology and Oncology

## 2021-07-27 ENCOUNTER — Ambulatory Visit: Payer: Self-pay | Admitting: Oncology

## 2021-07-27 ENCOUNTER — Telehealth: Payer: Self-pay | Admitting: Oncology

## 2021-07-27 ENCOUNTER — Inpatient Hospital Stay: Payer: Self-pay

## 2021-07-27 VITALS — BP 128/73 | HR 63 | Temp 98.0°F | Resp 16 | Ht 61.0 in | Wt 151.7 lb

## 2021-07-27 DIAGNOSIS — C50412 Malignant neoplasm of upper-outer quadrant of left female breast: Secondary | ICD-10-CM

## 2021-07-27 DIAGNOSIS — Z171 Estrogen receptor negative status [ER-]: Secondary | ICD-10-CM

## 2021-07-27 LAB — CBC AND DIFFERENTIAL
HCT: 36 (ref 36–46)
Hemoglobin: 12.1 (ref 12.0–16.0)
Neutrophils Absolute: 1.92
Platelets: 170 (ref 150–399)
WBC: 3.2

## 2021-07-27 LAB — BASIC METABOLIC PANEL
BUN: 10 (ref 4–21)
CO2: 22 (ref 13–22)
Chloride: 108 (ref 99–108)
Creatinine: 1 (ref 0.5–1.1)
Glucose: 117
Potassium: 3.9 (ref 3.4–5.3)
Sodium: 141 (ref 137–147)

## 2021-07-27 LAB — CBC
MCV: 98 (ref 81–99)
RBC: 3.68 — AB (ref 3.87–5.11)

## 2021-07-27 LAB — COMPREHENSIVE METABOLIC PANEL
Albumin: 3.9 (ref 3.5–5.0)
Calcium: 9.4 (ref 8.7–10.7)

## 2021-07-27 LAB — HEPATIC FUNCTION PANEL
ALT: 31 (ref 7–35)
AST: 52 — AB (ref 13–35)
Alkaline Phosphatase: 139 — AB (ref 25–125)
Bilirubin, Total: 0.4

## 2021-07-27 NOTE — Progress Notes (Signed)
Sent in request for DOS 10/03 to [employer].

## 2021-07-27 NOTE — Telephone Encounter (Signed)
Per 9/29 LOS, patient scheduled for Oct Appt's.  Gave patient Appt Summary

## 2021-07-28 ENCOUNTER — Encounter: Payer: Self-pay | Admitting: Oncology

## 2021-07-28 ENCOUNTER — Inpatient Hospital Stay: Payer: Self-pay

## 2021-07-28 MED FILL — Dexamethasone Sodium Phosphate Inj 100 MG/10ML: INTRAMUSCULAR | Qty: 1 | Status: AC

## 2021-07-31 ENCOUNTER — Other Ambulatory Visit: Payer: Self-pay

## 2021-07-31 ENCOUNTER — Inpatient Hospital Stay: Payer: Self-pay | Attending: Oncology

## 2021-07-31 VITALS — BP 107/58 | HR 84 | Temp 98.1°F | Resp 18 | Ht 61.0 in | Wt 151.0 lb

## 2021-07-31 DIAGNOSIS — Z5112 Encounter for antineoplastic immunotherapy: Secondary | ICD-10-CM | POA: Insufficient documentation

## 2021-07-31 DIAGNOSIS — Z79899 Other long term (current) drug therapy: Secondary | ICD-10-CM | POA: Insufficient documentation

## 2021-07-31 DIAGNOSIS — Z171 Estrogen receptor negative status [ER-]: Secondary | ICD-10-CM | POA: Insufficient documentation

## 2021-07-31 DIAGNOSIS — C50412 Malignant neoplasm of upper-outer quadrant of left female breast: Secondary | ICD-10-CM | POA: Insufficient documentation

## 2021-07-31 DIAGNOSIS — C7931 Secondary malignant neoplasm of brain: Secondary | ICD-10-CM | POA: Insufficient documentation

## 2021-07-31 MED ORDER — SODIUM CHLORIDE 0.9% FLUSH
10.0000 mL | INTRAVENOUS | Status: DC | PRN
  Administered 2021-07-31: 10 mL

## 2021-07-31 MED ORDER — SODIUM CHLORIDE 0.9 % IV SOLN
10.0000 mg | Freq: Once | INTRAVENOUS | Status: AC
  Administered 2021-07-31: 10 mg via INTRAVENOUS
  Filled 2021-07-31: qty 10

## 2021-07-31 MED ORDER — ACETAMINOPHEN 325 MG PO TABS
650.0000 mg | ORAL_TABLET | Freq: Once | ORAL | Status: AC
  Administered 2021-07-31: 650 mg via ORAL
  Filled 2021-07-31: qty 2

## 2021-07-31 MED ORDER — FAM-TRASTUZUMAB DERUXTECAN-NXKI CHEMO 100 MG IV SOLR
4.2500 mg/kg | Freq: Once | INTRAVENOUS | Status: AC
  Administered 2021-07-31: 300 mg via INTRAVENOUS
  Filled 2021-07-31: qty 15

## 2021-07-31 MED ORDER — DIPHENHYDRAMINE HCL 25 MG PO CAPS
50.0000 mg | ORAL_CAPSULE | Freq: Once | ORAL | Status: AC
  Administered 2021-07-31: 50 mg via ORAL
  Filled 2021-07-31: qty 2

## 2021-07-31 MED ORDER — HEPARIN SOD (PORK) LOCK FLUSH 100 UNIT/ML IV SOLN
500.0000 [IU] | Freq: Once | INTRAVENOUS | Status: AC | PRN
  Administered 2021-07-31: 500 [IU]

## 2021-07-31 MED ORDER — DEXTROSE 5 % IV SOLN: Freq: Once | INTRAVENOUS | Status: AC

## 2021-07-31 MED ORDER — PALONOSETRON HCL INJECTION 0.25 MG/5ML
0.2500 mg | Freq: Once | INTRAVENOUS | Status: AC
  Administered 2021-07-31: 0.25 mg via INTRAVENOUS
  Filled 2021-07-31: qty 5

## 2021-07-31 NOTE — Patient Instructions (Signed)
Fam-Trastuzumab deruxtecan injection What is this medication? TRASTUZUMAB DERUXTECAN (tras TOOZ eu mab DER ux TEE kan) is a chemotherapy medicine and a monoclonal antibody. It treats certain types of cancer. Some of the cancers treated are breast cancer and gastric cancer. This medicine may be used for other purposes; ask your health care provider or pharmacist if you have questions. COMMON BRAND NAME(S): ENHERTU What should I tell my care team before I take this medication? They need to know if you have any of these conditions: heart disease heart failure infection (especially a virus infection such as chickenpox, cold sores, or herpes) liver disease lung or breathing disease, like asthma an unusual or allergic reaction to fam-trastuzumab deruxtecan, other medications, foods, dyes, or preservatives pregnant or trying to get pregnant breast-feeding How should I use this medication? This medicine is for infusion into a vein. It is given by a health care professional in a hospital or clinic setting. Talk to your pediatrician regarding the use of this medicine in children. Special care may be needed. Overdosage: If you think you have taken too much of this medicine contact a poison control center or emergency room at once. NOTE: This medicine is only for you. Do not share this medicine with others. What if I miss a dose? It is important not to miss your dose. Call your doctor or health care professional if you are unable to keep an appointment. What may interact with this medication? Interaction studies have not been performed. This list may not describe all possible interactions. Give your health care provider a list of all the medicines, herbs, non-prescription drugs, or dietary supplements you use. Also tell them if you smoke, drink alcohol, or use illegal drugs. Some items may interact with your medicine. What should I watch for while using this medication? Visit your healthcare  professional for regular checks on your progress. Tell your healthcare professional if your symptoms do not start to get better or if they get worse. Your condition will be monitored carefully while you are receiving this medicine. Do not become pregnant while taking this medicine or for 7 months after stopping it. Women should inform their healthcare professional if they wish to become pregnant or think they might be pregnant. Men should not father a child while taking this medicine and for 4 months after stopping it. There is potential for serious side effects to an unborn child. Talk to your healthcare professional for more information. Do not breast-feed an infant while taking this medicine or for 7 months after the last dose. This medicine has caused decreased sperm counts in some men. This may make it more difficult to father a child. Talk to your healthcare professional if you are concerned about your fertility. This medicine may increase your risk to bruise or bleed. Call your health care professional if you notice any unusual bleeding. Be careful brushing or flossing your teeth or using a toothpick because you may get an infection or bleed more easily. If you have any dental work done, tell your dentist you are receiving this medicine. This medicine may cause dry eyes [and blurred vision]. If you wear contact lenses, you may feel some discomfort. Lubricating eye drops may help. See your healthcare professional if the problem does not go away or is severe. Call your healthcare professional for advice if you get a fever, chills, or sore throat, or other symptoms of a cold or flu. Do not treat yourself. This medicine decreases your body's ability to fight infections.  Try to avoid being around people who are sick. Avoid taking medicines that contain aspirin, acetaminophen, ibuprofen, naproxen, or ketoprofen unless instructed by your healthcare professional. These medicines may hide a fever. What side  effects may I notice from receiving this medication? Side effects that you should report to your doctor or health care professional as soon as possible: allergic reactions like skin rash, itching or hives, swelling of the face, lips, or tongue breathing problems cough nausea, vomiting signs and symptoms of bleeding such as bloody or black, tarry stools; red or dark-brown urine; spitting up blood or brown material that looks like coffee grounds; red spots on the skin; unusual bruising or bleeding from the eye, gums, or nose signs and symptoms of heart failure like breathing problems, fast, irregular heartbeat, sudden weight gain; swelling of the ankles, feet, hands; unusually weak or tired signs and symptoms of infection like fever; chills; cough; sore throat; pain or trouble passing urine signs and symptoms of low red blood cells or anemia such as unusually weak or tired; feeling faint or lightheaded; falls; breathing problems Side effects that usually do not require medical attention (report these to your doctor or health care professional if they continue or are bothersome): constipation diarrhea dry eyes hair loss loss of appetite mouth sores rash This list may not describe all possible side effects. Call your doctor for medical advice about side effects. You may report side effects to FDA at 1-800-FDA-1088. Where should I keep my medication? This drug is given in a hospital or clinic and will not be stored at home. NOTE: This sheet is a summary. It may not cover all possible information. If you have questions about this medicine, talk to your doctor, pharmacist, or health care provider.  2022 Elsevier/Gold Standard (2020-03-16 16:25:39)

## 2021-08-17 ENCOUNTER — Other Ambulatory Visit: Payer: Self-pay | Admitting: Pharmacist

## 2021-08-17 ENCOUNTER — Ambulatory Visit: Payer: Self-pay | Admitting: Oncology

## 2021-08-17 ENCOUNTER — Other Ambulatory Visit: Payer: Self-pay

## 2021-08-17 NOTE — Progress Notes (Signed)
Sent in transportation request for  DOS 10/24 and 10/25 to [employer].

## 2021-08-18 ENCOUNTER — Ambulatory Visit: Payer: Self-pay

## 2021-08-18 NOTE — Progress Notes (Signed)
[facility]  [address] [place],  [place]  [address] [phone]  Clinic Day:  08/21/2021  Referring physician: Marguerita Merles, MD  This document serves as a record of services personally performed by Gray Doering Macarthur Critchley, MD. It was created on their behalf by [facility] E, a trained medical scribe. The creation of this record is based on the scribe's personal observations and the provider's statements to them.  HISTORY OF PRESENT ILLNESS:  The patient is a 70 y.o. 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  She also has a subcarinal lymph node for which scans have shown a positive response to therapy.  She comes in today prior to her 8th cycle of Enhertu.  The patient states that she tolerated her 7th cycle well, with all cycles not being given at a 25% dose reduction.  As it pertains to her previously metastatic CNS disease, she has been having recurrent, intermittent headaches, but denies having vision or balance changes.    VITALS:  Blood pressure 122/71, pulse 63, temperature 98.2 F (36.8 C), resp. rate 14, height 5' 1"  (1.549 m), weight 150 lb 1.6 oz (68.1 kg), SpO2 97 %.  Wt Readings from Last 3 Encounters:  08/21/21 150 lb 1.6 oz (68.1 kg)  07/31/21 151 lb (68.5 kg)  07/27/21 151 lb 11.2 oz (68.8 kg)    Body mass index is 28.36 kg/m.  Performance status (ECOG): 1 - Symptomatic but completely ambulatory  PHYSICAL EXAM:  Physical Exam Constitutional:      General: She is not in acute distress.    Appearance: Normal appearance. She is normal weight.  HENT:     Head: Normocephalic and atraumatic.  Eyes:     General: No scleral icterus.    Extraocular Movements: Extraocular movements intact.     Conjunctiva/sclera: Conjunctivae normal.     Pupils: Pupils are equal, round, and reactive to light.  Cardiovascular:     Rate and  Rhythm: Normal rate and regular rhythm.     Pulses: Normal pulses.     Heart sounds: Normal heart sounds. No murmur heard.   No friction rub. No gallop.  Pulmonary:     Effort: Pulmonary effort is normal. No respiratory distress.     Breath sounds: Normal breath sounds.  Abdominal:     General: Bowel sounds are normal. There is no distension.     Palpations: Abdomen is soft. There is no hepatomegaly, splenomegaly or mass.     Tenderness: There is no abdominal tenderness.  Musculoskeletal:        General: Normal range of motion.     Cervical back: Normal range of motion and neck supple.     Right lower leg: No edema.     Left lower leg: No edema.  Lymphadenopathy:     Cervical: No cervical adenopathy.  Skin:    General: Skin is warm and dry.  Neurological:     General: No focal deficit present.     Mental Status: She is alert and oriented to person, place, and time. Mental status is at baseline.  Psychiatric:        Mood and Affect: Mood normal.        Behavior: Behavior normal.        Thought Content: Thought content normal.        Judgment: Judgment normal.    LABS:   CBC  Latest Ref Rng & Units 08/21/2021 07/27/2021 07/06/2021  WBC - 2.3 3.2 3.2  Hemoglobin 12.0 - 16.0 12.5 12.1 12.1  Hematocrit 36 - 46 38 36 36  Platelets 150 - 399 157 170 189   CMP Latest Ref Rng & Units 08/21/2021 07/27/2021 07/06/2021  Glucose 70 - 99 mg/dL - - -  BUN 4 - 21 12 10 9   Creatinine 0.5 - 1.1 0.7 1.0 0.6  Sodium 137 - 147 140 141 141  Potassium 3.4 - 5.3 3.9 3.9 3.9  Chloride 99 - 108 110(A) 108 107  CO2 13 - 22 24(A) 22 25(A)  Calcium 8.7 - 10.7 9.3 9.4 9.7  Total Protein 6.5 - 8.1 g/dL - - -  Total Bilirubin 0.3 - 1.2 mg/dL - - -  Alkaline Phos 25 - 125 151(A) 139(A) 147(A)  AST 13 - 35 56(A) 52(A) 53(A)  ALT 7 - 35 35 31 34    ASSESSMENT & PLAN:  Assessment/Plan:  A 70 y.o. 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  She also has subcarinal nodal disease.  She will proceed with her 8th cycle of Enhertu tomorrow.    Clinically, she has been doing better since she has had the 25% dose reduction with her Enhertu therapy.  I will see her back in 3 weeks before she heads into her 9th cycle of treatment.  A chest CT will be done before her next visit to ascertain her new disease baseline.  I will also have her undergo a repeat brain MRI to ensure her recent headaches do not represent new CNS metastasis.  The patient understands all the plans discussed today and is in agreement with them.    I, Rita Ohara, am acting as scribe for Marice Potter, MD    I have reviewed this report as typed by the medical scribe, and it is complete and accurate.  Vonne Mcdanel Macarthur Critchley, MD

## 2021-08-21 ENCOUNTER — Other Ambulatory Visit: Payer: Self-pay | Admitting: Oncology

## 2021-08-21 ENCOUNTER — Inpatient Hospital Stay (HOSPITAL_BASED_OUTPATIENT_CLINIC_OR_DEPARTMENT_OTHER): Payer: Self-pay | Admitting: Oncology

## 2021-08-21 ENCOUNTER — Other Ambulatory Visit: Payer: Self-pay

## 2021-08-21 ENCOUNTER — Encounter: Payer: Self-pay | Admitting: Oncology

## 2021-08-21 ENCOUNTER — Inpatient Hospital Stay: Payer: Self-pay

## 2021-08-21 VITALS — BP 122/71 | HR 63 | Temp 98.2°F | Resp 14 | Ht 61.0 in | Wt 150.1 lb

## 2021-08-21 DIAGNOSIS — D496 Neoplasm of unspecified behavior of brain: Secondary | ICD-10-CM

## 2021-08-21 DIAGNOSIS — C50412 Malignant neoplasm of upper-outer quadrant of left female breast: Secondary | ICD-10-CM

## 2021-08-21 DIAGNOSIS — Z171 Estrogen receptor negative status [ER-]: Secondary | ICD-10-CM

## 2021-08-21 LAB — BASIC METABOLIC PANEL
BUN: 12 (ref 4–21)
CO2: 24 — AB (ref 13–22)
Chloride: 110 — AB (ref 99–108)
Creatinine: 0.7 (ref 0.5–1.1)
Glucose: 103
Potassium: 3.9 (ref 3.4–5.3)
Sodium: 140 (ref 137–147)

## 2021-08-21 LAB — CBC: RBC: 3.87 (ref 3.87–5.11)

## 2021-08-21 LAB — HEPATIC FUNCTION PANEL
ALT: 35 (ref 7–35)
AST: 56 — AB (ref 13–35)
Alkaline Phosphatase: 151 — AB (ref 25–125)
Bilirubin, Total: 0.3

## 2021-08-21 LAB — CBC AND DIFFERENTIAL
HCT: 38 (ref 36–46)
Hemoglobin: 12.5 (ref 12.0–16.0)
Neutrophils Absolute: 1.17
Platelets: 157 (ref 150–399)
WBC: 2.3

## 2021-08-21 LAB — COMPREHENSIVE METABOLIC PANEL
Albumin: 4 (ref 3.5–5.0)
Calcium: 9.3 (ref 8.7–10.7)

## 2021-08-21 MED FILL — Fam-Trastuzumab Deruxtecan-nxki For IV Soln 100 MG: INTRAVENOUS | Qty: 15 | Status: AC

## 2021-08-21 MED FILL — Dexamethasone Sodium Phosphate Inj 100 MG/10ML: INTRAMUSCULAR | Qty: 1 | Status: AC

## 2021-08-22 ENCOUNTER — Inpatient Hospital Stay: Payer: Self-pay

## 2021-08-22 VITALS — BP 115/69 | HR 74 | Temp 97.7°F | Resp 18 | Ht 61.0 in | Wt 149.1 lb

## 2021-08-22 DIAGNOSIS — C50412 Malignant neoplasm of upper-outer quadrant of left female breast: Secondary | ICD-10-CM

## 2021-08-22 DIAGNOSIS — Z171 Estrogen receptor negative status [ER-]: Secondary | ICD-10-CM

## 2021-08-22 MED ORDER — PALONOSETRON HCL INJECTION 0.25 MG/5ML
0.2500 mg | Freq: Once | INTRAVENOUS | Status: AC
  Administered 2021-08-22: 0.25 mg via INTRAVENOUS
  Filled 2021-08-22: qty 5

## 2021-08-22 MED ORDER — DEXTROSE 5 % IV SOLN
4.2500 mg/kg | Freq: Once | INTRAVENOUS | Status: AC
  Administered 2021-08-22: 300 mg via INTRAVENOUS
  Filled 2021-08-22: qty 15

## 2021-08-22 MED ORDER — DIPHENHYDRAMINE HCL 25 MG PO CAPS
50.0000 mg | ORAL_CAPSULE | Freq: Once | ORAL | Status: AC
  Administered 2021-08-22: 50 mg via ORAL
  Filled 2021-08-22: qty 2

## 2021-08-22 MED ORDER — ACETAMINOPHEN 325 MG PO TABS
650.0000 mg | ORAL_TABLET | Freq: Once | ORAL | Status: AC
  Administered 2021-08-22: 650 mg via ORAL
  Filled 2021-08-22: qty 2

## 2021-08-22 MED ORDER — DEXTROSE 5 % IV SOLN: Freq: Once | INTRAVENOUS | Status: AC

## 2021-08-22 MED ORDER — SODIUM CHLORIDE 0.9% FLUSH
10.0000 mL | INTRAVENOUS | Status: DC | PRN
  Administered 2021-08-22: 10 mL

## 2021-08-22 MED ORDER — SODIUM CHLORIDE 0.9 % IV SOLN
10.0000 mg | Freq: Once | INTRAVENOUS | Status: AC
  Administered 2021-08-22: 10 mg via INTRAVENOUS
  Filled 2021-08-22: qty 10

## 2021-08-22 MED ORDER — HEPARIN SOD (PORK) LOCK FLUSH 100 UNIT/ML IV SOLN
500.0000 [IU] | Freq: Once | INTRAVENOUS | Status: AC | PRN
  Administered 2021-08-22: 500 [IU]

## 2021-08-22 NOTE — Progress Notes (Signed)
Discharged home, stable. Family with patient

## 2021-08-22 NOTE — Patient Instructions (Signed)
[facility]  Discharge Instructions: Thank you for choosing [facility] to provide your oncology and hematology care.  If you have a lab appointment with the [facility], please go directly to the [facility] and check in at the registration area.   Wear comfortable clothing and clothing appropriate for easy access to any Portacath or PICC line.   We strive to give you quality time with your provider. You may need to reschedule your appointment if you arrive late (15 or more minutes).  Arriving late affects you and other patients whose appointments are after yours.  Also, if you miss three or more appointments without notifying the office, you may be dismissed from the clinic at the provider's discretion.      For prescription refill requests, have your pharmacy contact our office and allow 72 hours for refills to be completed.    Today you received the following chemotherapy and/or immunotherapy agents Enhertu Fam-Trastuzumab deruxtecan injection Qu es este medicamento? TRASTUZUMAB DERUXTECAN es un medicamento quimioteraputico y un anticuerpo monoclonal. Trata ciertos tipos de cncer. Algunos de los tipos de cncer tratados son cncer de mama y cncer gstrico. Este medicamento puede ser utilizado para otros usos; si tiene alguna pregunta consulte con su proveedor de atencin mdica o con su farmacutico. MARCAS COMUNES: ENHERTU Qu le debo informar a mi profesional de la salud antes de tomar este medicamento? Necesitan saber si usted presenta alguno de los siguientes problemas o situaciones: enfermedad cardiaca insuficiencia cardiaca infeccin (especialmente infecciones virales, como varicela, fuegos labiales o herpes) enfermedad heptica enfermedad pulmonar o respiratoria, como asma una reaccin alrgica o inusual al fam-trastuzumab deruxtecan, a otros medicamentos, alimentos, colorantes o conservantes si est embarazada o buscando quedar  embarazada si est amamantando a un beb Cmo debo utilizar este medicamento? Este medicamento se administra mediante infusin en una vena. Lo administra un profesional de Technical sales engineer en un hospital o en un entorno clnico. Hable con su pediatra para informarse acerca del uso de este medicamento en nios. Puede requerir atencin especial. Sobredosis: Pngase en contacto inmediatamente con un centro toxicolgico o una sala de urgencia si usted cree que haya tomado demasiado medicamento. ATENCIN: [employer] es solo para usted. No comparta este medicamento con nadie. Qu sucede si me olvido de una dosis? Es importante no olvidar ninguna dosis. Informe a su mdico o a su profesional de la salud si no puede asistir a Photographer. Qu puede interactuar con este medicamento? No se han realizado estudios de interaccin. Puede ser que esta lista no menciona todas las posibles interacciones. Informe a su profesional de [employer]	[place] de [employer] productos a base de hierbas, medicamentos de [place] o suplementos nutritivos que est tomando. Si usted fuma, consume bebidas alcohlicas o si utiliza drogas ilegales, indqueselo tambin a su profesional de KB [employer]	[place]. Algunas sustancias pueden interactuar con su medicamento. A qu debo estar atento al usar Coca[employer]a? Visite a su profesional de la salud para que revise regularmente su evolucin. Informe a su profesional de la salud si sus sntomas no comienzan a mejorar o si empeoran. Se supervisar su estado de salud atentamente mientras reciba este medicamento. No debe quedar embarazada mientras est usando este medicamento o por 7 meses despus de dejar de usarlo. Las mujeres deben informar a su profesional de la salud si estn buscando quedar embarazadas o si creen que podran estar embarazadas. Los hombres no deben Social worker a Social research officer, government estn recibiendo Coca[employer]Cola y  durante 4 meses despus de dejar de usarlo. Existe la posibilidad de que  ocurran efectos secundarios graves en un beb sin nacer. Para obtener ms informacin, hable con su profesional de KB Home	[place]. No debe amamantar a un beb mientras est usando este medicamento o durante 7 meses despus de la ltima dosis. Este medicamento ha causado recuentos de esperma reducidos en algunos hombres. Esto puede hacer ms difcil que un hombre embarace a Musician. Hable con su profesional de la salud si le preocupa su fertilidad. Este medicamento podra aumentar el riesgo de moretones o sangrado. Consulte a su profesional de la salud si observa sangrados inusuales. Proceda con cuidado al cepillar sus dientes, usar hilo dental o Risk manager palillos para los dientes, ya que podra contraer una infeccin o Therapist, art con mayor facilidad. Si se somete a algn tratamiento dental, informe a su dentista que est [employer]. Este medicamento puede resecarle los ojos [y provocar visin borrosa]. Si [place] lentes de contacto, puede sentir ciertas molestias. Las gotas lubricantes para los ojos pueden ser tiles. Si el problema no desaparece o es severo, consulte a su profesional de KB Home	[place]. Consulte a su profesional de la salud si tiene fiebre, escalofros, dolor de garganta o cualquier otro sntoma de resfro o gripe. No se trate usted mismo. Este medicamento reduce la capacidad del cuerpo para combatir infecciones. Trate de no acercarse a personas que estn enfermas. Evite usar [employer] contienen aspirina, acetaminofeno, ibuprofeno, naproxeno o ketoprofeno, a menos que as lo indique su profesional de KB Home	[place]. Estos productos pueden ocultar la fiebre. Qu efectos secundarios puedo tener al [employer] este medicamento? Efectos secundarios que debe informar a su mdico o a Barrister's clerk de la salud tan pronto como sea posible: Chief of Staff, tales como erupcin cutnea, comezn/picazn o urticaria, e hinchazn de la cara, los labios o la lengua problemas para respirar tos nuseas,  vmito signos y sntomas de sangrado, tales como heces con sangre o de color negro y aspecto alquitranado; [place] de color rojo o marrn oscuro; escupir sangre o material marrn que tiene el aspecto de posos (residuos) de caf; Tree surgeon rojas en la piel; sangrado o moretones inusuales en los ojos, las encas o la nariz signos y sntomas de insuficiencia cardiaca tales como problemas para respirar; frecuencia cardiaca rpida, irregular; aumento repentino de peso; hinchazn de los tobillos, pies, manos; debilidad o cansancio inusual signos y sntomas de infeccin, tales como fiebre, escalofros, tos, Social research officer, government de garganta, Social research officer, government o dificultad para orinar signos y sntomas de baja cantidad de glbulos rojos o anemia, tales como debilidad o cansancio inusuales; sensacin de desmayo o aturdimiento; cadas; problemas para respirar Efectos secundarios que generalmente no requieren atencin mdica (debe informarlos a su mdico o a su profesional de la salud si persisten o si son molestos): estreimiento diarrea ojos secos cada del cabello prdida del apetito llagas en la boca erupcin cutnea Puede ser que esta lista no menciona todos los posibles efectos secundarios. Comunquese a su mdico por asesoramiento mdico [employer]. Usted puede informar los efectos secundarios a la FDA por telfono al 1-800-FDA-1088. Dnde debo guardar mi medicina? Este medicamento se administra en hospitales o clnicas, y no necesitar guardarlo en su domicilio. ATENCIN: Este folleto es un resumen. Puede ser que no cubra toda la posible informacin. Si usted tiene preguntas acerca de esta medicina, consulte con su mdico, su farmacutico o su profesional de Technical sales engineer.  2022 Elsevier/Gold Standard (2020-04-19 00:00:00)     To help prevent nausea  and vomiting after your treatment, we encourage you to take your nausea medication as directed.  BELOW ARE SYMPTOMS THAT SHOULD BE REPORTED IMMEDIATELY: *FEVER GREATER THAN  100.4 F (38 C) OR HIGHER *CHILLS OR SWEATING *NAUSEA AND VOMITING THAT IS NOT CONTROLLED WITH YOUR NAUSEA MEDICATION *UNUSUAL SHORTNESS OF BREATH *UNUSUAL BRUISING OR BLEEDING *URINARY PROBLEMS (pain or burning when urinating, or frequent urination) *BOWEL PROBLEMS (unusual diarrhea, constipation, pain near the anus) TENDERNESS IN MOUTH AND THROAT WITH OR WITHOUT PRESENCE OF ULCERS (sore throat, sores in mouth, or a toothache) UNUSUAL RASH, SWELLING OR PAIN  UNUSUAL VAGINAL DISCHARGE OR ITCHING   Items with * indicate a potential emergency and should be followed up as soon as possible or go to the Emergency Department if any problems should occur.  Please show the CHEMOTHERAPY ALERT CARD or IMMUNOTHERAPY ALERT CARD at check-in to the Emergency Department and triage nurse.  Should you have questions after your visit or need to cancel or reschedule your appointment, please contact [facility]  Dept: [phone]  and follow the prompts.  Office hours are 8:00 a.m. to 4:30 p.m. Monday - Friday. Please note that voicemails left after 4:00 p.m. may not be returned until the following business day.  We are closed weekends and major holidays. You have access to a nurse at all times for urgent questions. Please call the main number to the clinic Dept: [phone] and follow the prompts.  For any non-urgent questions, you may also contact your provider using MyChart. We now offer e-Visits for anyone 67 and older to request care online for non-urgent symptoms. For details visit [email or web address].   Also download the MyChart app! Go to the app store, search "MyChart", open the app, select [facility], and log in with your MyChart username and password.  Due to Covid, a mask is required upon entering the hospital/clinic. If you do not have a mask, one will be given to you upon arrival. For doctor visits, patients may have 1 support person aged 97 or older with them. For  treatment visits, patients cannot have anyone with them due to current Covid guidelines and our immunocompromised population.

## 2021-09-01 NOTE — Progress Notes (Signed)
[facility]  [address] [place],  [place]  [address] [phone]  Clinic Day:  09/08/2021  Referring physician: Marguerita Merles, MD  This document serves as a record of services personally performed by Ceciley Buist Macarthur Critchley, MD. It was created on their behalf by [facility] E, a trained medical scribe. The creation of this record is based on the scribe's personal observations and the provider's statements to them.  HISTORY OF PRESENT ILLNESS:  The patient is a 70 y.o. 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  She also has a subcarinal lymph node for which scans have shown a positive response to therapy.  She comes in today prior to go over her CT scans to ascertain her new disease baseline after 8 cycles of Enhertu.  The patient states that she tolerated her 8th cycle of treatment relatively well.  She continues to have occasional headaches and nausea, which have been manageable.  ,She denies having any new symptoms which concern her for disease progression.    VITALS:  Blood pressure (!) 107/59, pulse 85, temperature 98.2 F (36.8 C), resp. rate 16, height 5' 1"  (1.549 m), weight 150 lb 8 oz (68.3 kg), SpO2 95 %.  Wt Readings from Last 3 Encounters:  09/08/21 150 lb 8 oz (68.3 kg)  08/22/21 149 lb 1.8 oz (67.6 kg)  08/21/21 150 lb 1.6 oz (68.1 kg)    Body mass index is 28.44 kg/m.  Performance status (ECOG): 1 - Symptomatic but completely ambulatory  PHYSICAL EXAM:  Physical Exam Constitutional:      General: She is not in acute distress.    Appearance: Normal appearance. She is normal weight.  HENT:     Head: Normocephalic and atraumatic.  Eyes:     General: No scleral icterus.    Extraocular Movements: Extraocular movements intact.     Conjunctiva/sclera: Conjunctivae normal.     Pupils: Pupils are equal, round, and reactive to light.   Cardiovascular:     Rate and Rhythm: Normal rate and regular rhythm.     Pulses: Normal pulses.     Heart sounds: Normal heart sounds. No murmur heard.   No friction rub. No gallop.  Pulmonary:     Effort: Pulmonary effort is normal. No respiratory distress.     Breath sounds: Normal breath sounds.  Abdominal:     General: Bowel sounds are normal. There is no distension.     Palpations: Abdomen is soft. There is no hepatomegaly, splenomegaly or mass.     Tenderness: There is no abdominal tenderness.  Musculoskeletal:        General: Normal range of motion.     Cervical back: Normal range of motion and neck supple.     Right lower leg: No edema.     Left lower leg: No edema.  Lymphadenopathy:     Cervical: No cervical adenopathy.  Skin:    General: Skin is warm and dry.  Neurological:     General: No focal deficit present.     Mental Status: She is alert and oriented to person, place, and time. Mental status is at baseline.  Psychiatric:        Mood and Affect: Mood normal.        Behavior: Behavior normal.        Thought Content: Thought content normal.        Judgment: Judgment normal.  SCANS: Recent imaging results have revealed the following:  FINDINGS: Cardiovascular: Heart size is normal. There is no significant pericardial fluid, thickening or pericardial calcification. Aortic atherosclerosis. No definite coronary artery calcifications. Right internal jugular single-lumen porta cath with tip terminating in the distal superior vena cava. Mediastinum/Nodes: No pathologically enlarged mediastinal or hilar lymph nodes. Moderate to large hiatal hernia. No axillary lymphadenopathy. Surgical clips in the left axilla from prior lymph node dissection. Lungs/Pleura: Tiny pulmonary nodules measuring 4 mm or less in size, stable in size and number compared to the prior examination, likely benign. No other larger more suspicious appearing pulmonary nodules or masses are  noted. No acute consolidative airspace disease. No pleural effusions. Upper Abdomen: Multiple low-attenuation lesions are noted in the liver, similar to the prior examination, compatible with simple cysts, largest of which is in the central aspect of the liver between segments 4A and 8 (axial image 77 of series 2) measuring 4.4 x 4.3 cm. Multiple low-attenuation lesions in the renal hila bilaterally, most compatible with peripelvic cysts. Musculoskeletal: There are no aggressive appearing lytic or blastic lesions noted in the visualized portions of the skeleton.  IMPRESSION: 1. No findings to suggest residual, recurrent or metastatic disease in the thorax. 2. Aortic atherosclerosis.  -----------------------------------------------------------------  FINDINGS: Brain: Left para median cerebellar resection cavity with stable peripheral density. No nodule or mass to suggest active metastatic disease. No infarct, hemorrhage, hydrocephalus, or collection. Vascular: Normal flow voids and vascular enhancements Skull and upper cervical spine: Normal marrow signal Sinuses/Orbits: Negative  IMPRESSION: Stable appearance of the left cerebellar resection cavity. No new or aggressive finding  ASSESSMENT & PLAN:  Assessment/Plan:  A 70 y.o. 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  She also has had subcarinal nodal disease.  In clinic today, I went over all of her scans with her, for which she appears to remain disease-free.  She was pleased with her scan results today.  She will proceed with her 9th cycle of Enhertu.  However, as she feels this agent is impacting her daily quality of life, her Enhertu dose will be decreased by another 20%.  I will see her back in 3 weeks before she heads into her 10th cycle of treatment.  The patient understands all the plans discussed today and is in agreement with them.    I,  Rita Ohara, am acting as scribe for Marice Potter, MD    I have reviewed this report as typed by the medical scribe, and it is complete and accurate.  Mcihael Hinderman Macarthur Critchley, MD

## 2021-09-08 ENCOUNTER — Encounter: Payer: Self-pay | Admitting: Oncology

## 2021-09-08 ENCOUNTER — Inpatient Hospital Stay: Payer: Self-pay | Attending: Oncology | Admitting: Oncology

## 2021-09-08 VITALS — BP 107/59 | HR 85 | Temp 98.2°F | Resp 16 | Ht 61.0 in | Wt 150.5 lb

## 2021-09-08 DIAGNOSIS — Z79899 Other long term (current) drug therapy: Secondary | ICD-10-CM | POA: Insufficient documentation

## 2021-09-08 DIAGNOSIS — C50412 Malignant neoplasm of upper-outer quadrant of left female breast: Secondary | ICD-10-CM | POA: Insufficient documentation

## 2021-09-08 DIAGNOSIS — Z170 Estrogen receptor positive status [ER+]: Secondary | ICD-10-CM | POA: Insufficient documentation

## 2021-09-08 DIAGNOSIS — C50912 Malignant neoplasm of unspecified site of left female breast: Secondary | ICD-10-CM

## 2021-09-08 DIAGNOSIS — Z5112 Encounter for antineoplastic immunotherapy: Secondary | ICD-10-CM | POA: Insufficient documentation

## 2021-09-08 DIAGNOSIS — C771 Secondary and unspecified malignant neoplasm of intrathoracic lymph nodes: Secondary | ICD-10-CM | POA: Insufficient documentation

## 2021-09-08 DIAGNOSIS — C7931 Secondary malignant neoplasm of brain: Secondary | ICD-10-CM | POA: Insufficient documentation

## 2021-09-08 NOTE — Progress Notes (Signed)
Sent in request for DOS 11/15 to [employer].

## 2021-09-08 NOTE — Progress Notes (Signed)
Decrease dose of fam-trastuzumab another 20% (total dose reduction of 45%) per Dr. Bobby Rumpf due to dose not tolerated.

## 2021-09-10 ENCOUNTER — Encounter: Payer: Self-pay | Admitting: Oncology

## 2021-09-11 ENCOUNTER — Telehealth: Payer: Self-pay | Admitting: Oncology

## 2021-09-11 ENCOUNTER — Ambulatory Visit: Payer: Self-pay

## 2021-09-11 MED FILL — Dexamethasone Sodium Phosphate Inj 100 MG/10ML: INTRAMUSCULAR | Qty: 1 | Status: AC

## 2021-09-11 NOTE — Progress Notes (Signed)
Sent in transportation request for DOS 11/16 and 11/17 to [employer].

## 2021-09-11 NOTE — Telephone Encounter (Signed)
Patient's Appt's adjusted to work in 11/16 Lab Appt - Transportation Concerns

## 2021-09-11 NOTE — Telephone Encounter (Signed)
Daughter has been notified of new appt information for labs (11/16) and Enhertu treatment (11/17). She was also notified that Evelena Peat has scheduled the patient for transportation services for each.

## 2021-09-12 ENCOUNTER — Inpatient Hospital Stay: Payer: Self-pay

## 2021-09-13 ENCOUNTER — Inpatient Hospital Stay: Payer: Self-pay

## 2021-09-13 ENCOUNTER — Encounter: Payer: Self-pay | Admitting: Oncology

## 2021-09-13 DIAGNOSIS — Z171 Estrogen receptor negative status [ER-]: Secondary | ICD-10-CM

## 2021-09-13 LAB — BASIC METABOLIC PANEL
BUN: 11 (ref 4–21)
CO2: 22 (ref 13–22)
Chloride: 112 — AB (ref 99–108)
Creatinine: 0.6 (ref 0.5–1.1)
Glucose: 121
Potassium: 3.7 (ref 3.4–5.3)
Sodium: 140 (ref 137–147)

## 2021-09-13 LAB — CBC AND DIFFERENTIAL
HCT: 36 (ref 36–46)
Hemoglobin: 12.1 (ref 12.0–16.0)
Neutrophils Absolute: 2.07
Platelets: 218 (ref 150–399)
WBC: 3.4

## 2021-09-13 LAB — COMPREHENSIVE METABOLIC PANEL
Albumin: 3.8 (ref 3.5–5.0)
Calcium: 9.5 (ref 8.7–10.7)

## 2021-09-13 LAB — HEPATIC FUNCTION PANEL
ALT: 31 (ref 7–35)
AST: 44 — AB (ref 13–35)
Alkaline Phosphatase: 128 — AB (ref 25–125)
Bilirubin, Total: 0.4

## 2021-09-13 LAB — CBC: RBC: 3.72 — AB (ref 3.87–5.11)

## 2021-09-14 ENCOUNTER — Inpatient Hospital Stay: Payer: Self-pay

## 2021-09-14 ENCOUNTER — Other Ambulatory Visit: Payer: Self-pay

## 2021-09-14 VITALS — BP 126/82 | HR 77 | Temp 98.6°F | Resp 16 | Wt 147.0 lb

## 2021-09-14 DIAGNOSIS — C50412 Malignant neoplasm of upper-outer quadrant of left female breast: Secondary | ICD-10-CM

## 2021-09-14 MED ORDER — DEXTROSE 5 % IV SOLN: Freq: Once | INTRAVENOUS | Status: AC

## 2021-09-14 MED ORDER — SODIUM CHLORIDE 0.9 % IV SOLN
10.0000 mg | Freq: Once | INTRAVENOUS | Status: AC
  Administered 2021-09-14: 10 mg via INTRAVENOUS
  Filled 2021-09-14: qty 1
  Filled 2021-09-14: qty 10

## 2021-09-14 MED ORDER — ACETAMINOPHEN 325 MG PO TABS
650.0000 mg | ORAL_TABLET | Freq: Once | ORAL | Status: AC
  Administered 2021-09-14: 650 mg via ORAL
  Filled 2021-09-14: qty 2

## 2021-09-14 MED ORDER — FAM-TRASTUZUMAB DERUXTECAN-NXKI CHEMO 100 MG IV SOLR
3.4000 mg/kg | Freq: Once | INTRAVENOUS | Status: AC
  Administered 2021-09-14: 240 mg via INTRAVENOUS
  Filled 2021-09-14 (×2): qty 12

## 2021-09-14 MED ORDER — DIPHENHYDRAMINE HCL 25 MG PO CAPS
50.0000 mg | ORAL_CAPSULE | Freq: Once | ORAL | Status: AC
  Administered 2021-09-14: 50 mg via ORAL
  Filled 2021-09-14: qty 2

## 2021-09-14 MED ORDER — PALONOSETRON HCL INJECTION 0.25 MG/5ML
0.2500 mg | Freq: Once | INTRAVENOUS | Status: AC
  Administered 2021-09-14: 0.25 mg via INTRAVENOUS
  Filled 2021-09-14: qty 5

## 2021-09-14 NOTE — Patient Instructions (Signed)
Fam-Trastuzumab deruxtecan injection O que  este medicamento? O TRASTUZUMABE DERUXTECANO  um medicamento quimioterpico e um anticorpo monoclonal. Trata alguns tipos de cncer. Alguns dos cnceres tratados so o cncer de mama e o cncer gstrico. Este medicamento pode ser usado para outros propsitos; em caso de dvidas, pergunte ao seu profissional de sade ou farmacutico. NOMES DE MARCAS COMUNS: ENHERTU O que devo dizer a meu profissional de sade antes de tomar este medicamento? Precisam saber se voc tem algum dos seguintes problemas ou estados de sade: doenas cardacas insuficincia cardaca infeco (principalmente viroses, como catapora, herpes labial ou herpes) doenas hepticas doenas pulmonares ou respiratrias (por exemplo, asma) reao estranha ou alergia ao fam-trastuzumabe deruxtecano reao estranha ou alergia a outros medicamentos reao estranha ou alergia a alimentos, corantes ou conservantes est grvida ou tentando engravidar est [employer] devo usar este medicamento? Este medicamento  para infuso intravenosa. Este medicamento  administrado por um profissional da sade no hospital ou em consultrio. Fale com seu pediatra a respeito do uso deste medicamento em crianas. Pode ser preciso tomar alguns cuidados especiais. Superdosagem: Se achar que tomou uma superdosagem deste medicamento, entre em contato imediatamente com o [facility] ou v a [employer]. OBSERVAO: Este medicamento  s para voc. No compartilhe este medicamento com outras pessoas. E se eu deixar de tomar uma dose?  importante que voc no perca nenhuma dose. Se no puder comparecer a uma consulta, entre em contato com seu mdico ou profissional de sade. O que pode interagir com este medicamento? No foram feitos estudos sobre interaes. Esta lista pode no descrever todas as interaes possveis. D ao seu profissional de sade uma lista de todos os  medicamentos, ervas medicinais, remdios de venda livre, ou suplementos alimentares que voc [place]. Diga tambm se voc fuma, bebe, ou [place] drogas ilcitas. Alguns destes podem interagir com o seu medicamento. Ao que devo ficar atento quando estiver [employer] medicamento? Consulte seu mdico ou profissional de sade para realizar um acompanhamento regular Diplomatic Services operational officer. Avise seu mdico ou profissional de sade se os seus sintomas no melhorarem ou se piorarem. Voc ser monitorado(a) atentamente enquanto estiver [employer]. No engravide enquanto estiver tomando este medicamento nem por 7 meses aps parar de tom-lo. Mulheres que [employer] ou que acreditem que possam estar grvidas devem avisar ao seu profissional de sade. Homens no devem engravidar suas parceiras enquanto tomarem este medicamento e por 4 meses depois de parar o tratamento. H risco de graves efeitos colaterais no feto. Para mais informaes, fale com seu mdico ou profissional de sade. No amamente enquanto estiver tomando este medicamento nem por 7 meses aps a ltima dose. Este medicamento causou contagem espermtica baixa em alguns homens. Isto pode interferir com a sua fertilidade. Converse com seu mdico ou profissional de sade se estiver preocupado com a sua fertilidade. Este medicamento pode aumentar o risco de hematomas e hemorragias. Ao notar qualquer sangramento fora do comum, entre em contato com seu mdico ou profissional de sade imediatamente. Tome cuidado ao escovar ou palitar os dentes e ao usar fio dental, pois pode pegar uma infeco ou sangrar mais facilmente. Se tiver que passar por algum procedimento odontolgico, avise a seu dentista que est [employer]. Este medicamento pode provocar secura nos olhos [e viso turva]. Se [place] lentes de contato, pode sentir um pouco de desconforto. Um colrio lubrificante pode ajudar. Consulte seu mdico ou profissional de sade se o problema  persistir ou for grave. Se tiver febre,  calafrios, dor de garganta ou outros sintomas de resfriado ou gripe, pea orientao ao seu mdico ou profissional de sade. No se automedique. Este medicamento pode diminuir a capacidade do seu organismo de combater infeces. Evite ficar perto de pessoas doentes. Evite tomar medicamentos que contenham aspirina, paracetamol (acetaminofeno), ibuprofeno, naproxeno ou cetoprofeno, salvo instruo em contrrio do seu mdico ou profissional de sade. Estes medicamentos podem esconder uma febre. Que efeitos colaterais posso sentir aps usar este medicamento? Efeitos colaterais que devem ser informados ao seu mdico ou profissional de sade o mais rpido possvel: reaes alrgicas, como erupo na pele, coceira, urticria, ou inchao do rosto, dos lbios ou da lngua dificuldade para respirar tosse enjoo ou vmitos sinais ou sintomas de hemorragia, como fezes sanguinolentas ou negras com aspecto de piche; urina vermelha ou marrom escuro; tosse ou vmitos com sangue ou com grumos marrons com aspecto de borra de caf; manchas vermelhas na pele; e hematomas ou sangramentos fora do comum nos olhos, gengivas ou nariz sinais e sintomas da insuficincia cardaca, tais como problemas respiratrios; batimento cardaco acelerado ou irregular; ganho sbito de peso; [employer], ps ou mos; fraqueza ou cansao anormais sinais e sintomas de infeco, tais como febre; calafrios; tosse; dor de garganta; dor ou dificuldade para urinar sinais e sintomas de contagem baixa de glbulos vermelhos ou anemia, tais como fraqueza ou cansao incomuns; sensao de desmaio ou tonturas; quedas; ou problemas respiratrios Efeitos colaterais que normalmente no precisam de cuidados mdicos (avise ao mdico ou profissional de sade se persistirem ou forem incmodos): priso de ventre diarreia olhos secos queda de cabelo perda de apetite aftas erupo na pele Esta lista pode no  descrever todos os efeitos colaterais possveis. Para mais orientaes sobre efeitos colaterais, consulte o seu mdico. Voc pode relatar a ocorrncia de efeitos colaterais  FDA pelo telefone [phone]. Onde devo guardar meu medicamento? Este medicamento  administrado por um profissional da sade no hospital ou em consultrio. Voc no receber este medicamento para conservao em casa. OBSERVAO: Este folheto  um resumo. Pode no cobrir todas as informaes possveis. Se tiver dvidas a respeito deste medicamento, fale com seu mdico, farmacutico ou profissional de sade.  2022 Elsevier/Gold Standard (2020-04-19 00:00:00)

## 2021-09-14 NOTE — Progress Notes (Signed)
Discharged home, Private transportation company here to provide transportation for patient

## 2021-09-20 ENCOUNTER — Encounter: Payer: Self-pay | Admitting: Oncology

## 2021-09-25 ENCOUNTER — Encounter: Payer: Self-pay | Admitting: Oncology

## 2021-09-25 NOTE — Progress Notes (Signed)
[facility]  [address] [place],  [place]  [address] [phone]  Clinic Day:  10/03/2021  Referring physician: Marguerita Merles, MD  This document serves as a record of services personally performed by Linkyn Gobin Macarthur Critchley, MD. It was created on their behalf by [facility], a trained medical scribe. The creation of this record is based on the scribe's personal observations and the provider's statements to them.  HISTORY OF PRESENT ILLNESS:  The patient is a 70 y.o. 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  She also has a subcarinal lymph node for which scans have shown a positive response to therapy.  She comes in today prior to her 10th cycle of Enhertu.  The patient states that she tolerated her 9th cycle of treatment relatively well.  She continues to have occasional headaches and nausea, which have been manageable. She denies having any new symptoms which concern her for disease progression.    VITALS:  Blood pressure 125/70, pulse 64, temperature 97.9 F (36.6 C), resp. rate 16, height _0  (1.549 m), weight 150 lb 9.6 oz (68.3 kg), SpO2 98 %.  Wt Readings from Last 3 Encounters:  10/03/21 150 lb 9.6 oz (68.3 kg)  09/14/21 147 lb (66.7 kg)  09/08/21 150 lb 8 oz (68.3 kg)    Body mass index is 28.46 kg/m.  Performance status (ECOG): 1 - Symptomatic but completely ambulatory  PHYSICAL EXAM:  Physical Exam Constitutional:      General: She is not in acute distress.    Appearance: Normal appearance. She is normal weight.  HENT:     Head: Normocephalic and atraumatic.  Eyes:     General: No scleral icterus.    Extraocular Movements: Extraocular movements intact.     Conjunctiva/sclera: Conjunctivae normal.     Pupils: Pupils are equal, round, and reactive to light.  Cardiovascular:     Rate and Rhythm: Normal rate and regular  rhythm.     Pulses: Normal pulses.     Heart sounds: Normal heart sounds. No murmur heard.   No friction rub. No gallop.  Pulmonary:     Effort: Pulmonary effort is normal. No respiratory distress.     Breath sounds: Normal breath sounds.  Abdominal:     General: Bowel sounds are normal. There is no distension.     Palpations: Abdomen is soft. There is no hepatomegaly, splenomegaly or mass.     Tenderness: There is no abdominal tenderness.  Musculoskeletal:        General: Normal range of motion.     Cervical back: Normal range of motion and neck supple.     Right lower leg: No edema.     Left lower leg: No edema.  Lymphadenopathy:     Cervical: No cervical adenopathy.  Skin:    General: Skin is warm and dry.  Neurological:     General: No focal deficit present.     Mental Status: She is alert and oriented to person, place, and time. Mental status is at baseline.  Psychiatric:        Mood and Affect: Mood normal.        Behavior: Behavior normal.        Thought Content: Thought content normal.        Judgment: Judgment normal.    ASSESSMENT & PLAN:  Assessment/Plan:  A 70 y.o. female with metastatic  HER2 Neu receptor positive breast cancer, status post a suboccipital brain metastatecomy in April 2022.  She also has had subcarinal nodal disease. She will proceed with her 10th cycle of Enhertu this week.  Clinically, she appears to be doing well.  I will see her back in 3 weeks before she heads into her 11th cycle of treatment.  The patient understands all the plans discussed today and is in agreement with them.    I, Rita Ohara, am acting as scribe for Marice Potter, MD    I have reviewed this report as typed by the medical scribe, and it is complete and accurate.  Ediberto Sens Macarthur Critchley, MD

## 2021-09-26 NOTE — Progress Notes (Signed)
Sent in request for DOS 12/06 and 12/08 to [employer].

## 2021-09-29 ENCOUNTER — Other Ambulatory Visit: Payer: Self-pay

## 2021-09-29 ENCOUNTER — Encounter: Payer: Self-pay | Admitting: Oncology

## 2021-09-29 ENCOUNTER — Ambulatory Visit: Payer: Self-pay | Admitting: Oncology

## 2021-10-03 ENCOUNTER — Encounter: Payer: Self-pay | Admitting: Oncology

## 2021-10-03 ENCOUNTER — Inpatient Hospital Stay (HOSPITAL_BASED_OUTPATIENT_CLINIC_OR_DEPARTMENT_OTHER): Payer: Self-pay | Admitting: Oncology

## 2021-10-03 ENCOUNTER — Telehealth: Payer: Self-pay | Admitting: Oncology

## 2021-10-03 ENCOUNTER — Ambulatory Visit: Payer: Self-pay

## 2021-10-03 ENCOUNTER — Inpatient Hospital Stay: Payer: Self-pay | Attending: Oncology

## 2021-10-03 VITALS — BP 125/70 | HR 64 | Temp 97.9°F | Resp 16 | Ht 61.0 in | Wt 150.6 lb

## 2021-10-03 DIAGNOSIS — Z79899 Other long term (current) drug therapy: Secondary | ICD-10-CM | POA: Insufficient documentation

## 2021-10-03 DIAGNOSIS — C50412 Malignant neoplasm of upper-outer quadrant of left female breast: Secondary | ICD-10-CM

## 2021-10-03 DIAGNOSIS — C7931 Secondary malignant neoplasm of brain: Secondary | ICD-10-CM | POA: Insufficient documentation

## 2021-10-03 DIAGNOSIS — C771 Secondary and unspecified malignant neoplasm of intrathoracic lymph nodes: Secondary | ICD-10-CM | POA: Insufficient documentation

## 2021-10-03 DIAGNOSIS — Z171 Estrogen receptor negative status [ER-]: Secondary | ICD-10-CM | POA: Insufficient documentation

## 2021-10-03 DIAGNOSIS — Z5112 Encounter for antineoplastic immunotherapy: Secondary | ICD-10-CM | POA: Insufficient documentation

## 2021-10-03 LAB — HEPATIC FUNCTION PANEL
ALT: 34 (ref 7–35)
AST: 49 — AB (ref 13–35)
Alkaline Phosphatase: 127 — AB (ref 25–125)
Bilirubin, Total: 0.4

## 2021-10-03 LAB — BASIC METABOLIC PANEL
BUN: 14 (ref 4–21)
CO2: 21 (ref 13–22)
Chloride: 111 — AB (ref 99–108)
Creatinine: 0.6 (ref 0.5–1.1)
Glucose: 116
Potassium: 3.9 (ref 3.4–5.3)
Sodium: 140 (ref 137–147)

## 2021-10-03 LAB — CBC: RBC: 3.62 — AB (ref 3.87–5.11)

## 2021-10-03 LAB — CBC AND DIFFERENTIAL
HCT: 35 — AB (ref 36–46)
Hemoglobin: 11.8 — AB (ref 12.0–16.0)
Neutrophils Absolute: 1.98
Platelets: 172 (ref 150–399)
WBC: 3.3

## 2021-10-03 LAB — COMPREHENSIVE METABOLIC PANEL
Albumin: 3.6 (ref 3.5–5.0)
Calcium: 9.3 (ref 8.7–10.7)

## 2021-10-03 NOTE — Telephone Encounter (Signed)
Per 12/6 LOS, patient already scheduled for 12/28 Labs, Follow Up - 12/29 Infusion.  Printed Appt Calendar

## 2021-10-04 ENCOUNTER — Other Ambulatory Visit: Payer: Self-pay | Admitting: Pharmacist

## 2021-10-04 DIAGNOSIS — C50412 Malignant neoplasm of upper-outer quadrant of left female breast: Secondary | ICD-10-CM

## 2021-10-04 DIAGNOSIS — Z171 Estrogen receptor negative status [ER-]: Secondary | ICD-10-CM

## 2021-10-04 MED FILL — Fam-Trastuzumab Deruxtecan-nxki For IV Soln 100 MG: INTRAVENOUS | Qty: 12 | Status: AC

## 2021-10-04 MED FILL — Dexamethasone Sodium Phosphate Inj 100 MG/10ML: INTRAMUSCULAR | Qty: 1 | Status: AC

## 2021-10-05 ENCOUNTER — Other Ambulatory Visit: Payer: Self-pay

## 2021-10-05 ENCOUNTER — Inpatient Hospital Stay: Payer: Self-pay

## 2021-10-05 VITALS — Ht 61.0 in | Wt 148.8 lb

## 2021-10-05 DIAGNOSIS — C50412 Malignant neoplasm of upper-outer quadrant of left female breast: Secondary | ICD-10-CM

## 2021-10-05 DIAGNOSIS — Z171 Estrogen receptor negative status [ER-]: Secondary | ICD-10-CM

## 2021-10-05 MED ORDER — FAM-TRASTUZUMAB DERUXTECAN-NXKI CHEMO 100 MG IV SOLR
3.4000 mg/kg | Freq: Once | INTRAVENOUS | Status: AC
  Administered 2021-10-05: 240 mg via INTRAVENOUS
  Filled 2021-10-05: qty 12

## 2021-10-05 MED ORDER — DIPHENHYDRAMINE HCL 25 MG PO CAPS
50.0000 mg | ORAL_CAPSULE | Freq: Once | ORAL | Status: AC
  Administered 2021-10-05: 50 mg via ORAL
  Filled 2021-10-05: qty 2

## 2021-10-05 MED ORDER — DEXTROSE 5 % IV SOLN: Freq: Once | INTRAVENOUS | Status: AC

## 2021-10-05 MED ORDER — HEPARIN SOD (PORK) LOCK FLUSH 100 UNIT/ML IV SOLN
500.0000 [IU] | Freq: Once | INTRAVENOUS | Status: AC | PRN
  Administered 2021-10-05: 500 [IU]

## 2021-10-05 MED ORDER — ACETAMINOPHEN 325 MG PO TABS
650.0000 mg | ORAL_TABLET | Freq: Once | ORAL | Status: AC
  Administered 2021-10-05: 650 mg via ORAL
  Filled 2021-10-05: qty 2

## 2021-10-05 MED ORDER — PALONOSETRON HCL INJECTION 0.25 MG/5ML
0.2500 mg | Freq: Once | INTRAVENOUS | Status: AC
  Administered 2021-10-05: 0.25 mg via INTRAVENOUS
  Filled 2021-10-05: qty 5

## 2021-10-05 MED ORDER — SODIUM CHLORIDE 0.9% FLUSH
10.0000 mL | INTRAVENOUS | Status: DC | PRN
  Administered 2021-10-05: 10 mL

## 2021-10-05 MED ORDER — SODIUM CHLORIDE 0.9 % IV SOLN
10.0000 mg | Freq: Once | INTRAVENOUS | Status: AC
  Administered 2021-10-05: 10 mg via INTRAVENOUS
  Filled 2021-10-05: qty 10

## 2021-10-05 NOTE — Patient Instructions (Signed)
[facility]  Discharge Instructions: Thank you for choosing [facility] to provide your oncology and hematology care.  If you have a lab appointment with the [facility], please go directly to the [facility] and check in at the registration area.   Wear comfortable clothing and clothing appropriate for easy access to any Portacath or PICC line.   We strive to give you quality time with your provider. You may need to reschedule your appointment if you arrive late (15 or more minutes).  Arriving late affects you and other patients whose appointments are after yours.  Also, if you miss three or more appointments without notifying the office, you may be dismissed from the clinic at the provider's discretion.      For prescription refill requests, have your pharmacy contact our office and allow 72 hours for refills to be completed.    Today you received the following chemotherapy and/or immunotherapy agents Fam Trastuzumab      To help prevent nausea and vomiting after your treatment, we encourage you to take your nausea medication as directed.  BELOW ARE SYMPTOMS THAT SHOULD BE REPORTED IMMEDIATELY: *FEVER GREATER THAN 100.4 F (38 C) OR HIGHER *CHILLS OR SWEATING *NAUSEA AND VOMITING THAT IS NOT CONTROLLED WITH YOUR NAUSEA MEDICATION *UNUSUAL SHORTNESS OF BREATH *UNUSUAL BRUISING OR BLEEDING *URINARY PROBLEMS (pain or burning when urinating, or frequent urination) *BOWEL PROBLEMS (unusual diarrhea, constipation, pain near the anus) TENDERNESS IN MOUTH AND THROAT WITH OR WITHOUT PRESENCE OF ULCERS (sore throat, sores in mouth, or a toothache) UNUSUAL RASH, SWELLING OR PAIN  UNUSUAL VAGINAL DISCHARGE OR ITCHING   Items with * indicate a potential emergency and should be followed up as soon as possible or go to the Emergency Department if any problems should occur.  Please show the CHEMOTHERAPY ALERT CARD or IMMUNOTHERAPY ALERT CARD at check-in to  the Emergency Department and triage nurse.  Should you have questions after your visit or need to cancel or reschedule your appointment, please contact [facility]  Dept: [phone]  and follow the prompts.  Office hours are 8:00 a.m. to 4:30 p.m. Monday - Friday. Please note that voicemails left after 4:00 p.m. may not be returned until the following business day.  We are closed weekends and major holidays. You have access to a nurse at all times for urgent questions. Please call the main number to the clinic Dept: [phone] and follow the prompts.  For any non-urgent questions, you may also contact your provider using MyChart. We now offer e-Visits for anyone 64 and older to request care online for non-urgent symptoms. For details visit [email or web address].   Also download the MyChart app! Go to the app store, search "MyChart", open the app, select , and log in with your MyChart username and password.  Due to Covid, a mask is required upon entering the hospital/clinic. If you do not have a mask, one will be given to you upon arrival. For doctor visits, patients may have 1 support person aged 42 or older with them. For treatment visits, patients cannot have anyone with them due to current Covid guidelines and our immunocompromised population.

## 2021-10-05 NOTE — Progress Notes (Signed)
1548:PT STABLE AT TIME OF DISCHARGE

## 2021-10-10 ENCOUNTER — Encounter: Payer: Self-pay | Admitting: Oncology

## 2021-10-11 ENCOUNTER — Telehealth: Payer: Self-pay | Admitting: Oncology

## 2021-10-11 NOTE — Telephone Encounter (Signed)
Patient scheduled for Jan 2023 Appt's.  Requested patient be given Updated Appt Calendar

## 2021-10-16 NOTE — Progress Notes (Signed)
Sent in request for DOS 12/28 and 12/29 to [employer].

## 2021-10-19 ENCOUNTER — Other Ambulatory Visit: Payer: Self-pay

## 2021-10-19 ENCOUNTER — Ambulatory Visit: Payer: Self-pay | Admitting: Oncology

## 2021-10-24 ENCOUNTER — Ambulatory Visit: Payer: Self-pay

## 2021-10-25 ENCOUNTER — Other Ambulatory Visit: Payer: Self-pay | Admitting: Hematology and Oncology

## 2021-10-25 ENCOUNTER — Encounter: Payer: Self-pay | Admitting: Hematology and Oncology

## 2021-10-25 ENCOUNTER — Inpatient Hospital Stay (HOSPITAL_BASED_OUTPATIENT_CLINIC_OR_DEPARTMENT_OTHER): Payer: Self-pay | Admitting: Hematology and Oncology

## 2021-10-25 ENCOUNTER — Inpatient Hospital Stay: Payer: Self-pay

## 2021-10-25 DIAGNOSIS — C50412 Malignant neoplasm of upper-outer quadrant of left female breast: Secondary | ICD-10-CM

## 2021-10-25 DIAGNOSIS — I361 Nonrheumatic tricuspid (valve) insufficiency: Secondary | ICD-10-CM

## 2021-10-25 DIAGNOSIS — Z171 Estrogen receptor negative status [ER-]: Secondary | ICD-10-CM

## 2021-10-25 LAB — CBC
MCV: 95 (ref 81–99)
RBC: 3.54 — AB (ref 3.87–5.11)

## 2021-10-25 LAB — CBC AND DIFFERENTIAL
HCT: 34 — AB (ref 36–46)
Hemoglobin: 11 — AB (ref 12.0–16.0)
Neutrophils Absolute: 1.98
Platelets: 228 (ref 150–399)
WBC: 3.2

## 2021-10-25 LAB — COMPREHENSIVE METABOLIC PANEL
Albumin: 3.6 (ref 3.5–5.0)
Calcium: 9.2 (ref 8.7–10.7)

## 2021-10-25 LAB — BASIC METABOLIC PANEL
BUN: 12 (ref 4–21)
CO2: 23 — AB (ref 13–22)
Chloride: 110 — AB (ref 99–108)
Creatinine: 0.6 (ref 0.5–1.1)
Glucose: 125
Potassium: 3.8 (ref 3.4–5.3)
Sodium: 139 (ref 137–147)

## 2021-10-25 LAB — HEPATIC FUNCTION PANEL
ALT: 32 (ref 7–35)
AST: 50 — AB (ref 13–35)
Alkaline Phosphatase: 110 (ref 25–125)
Bilirubin, Total: 0.6

## 2021-10-25 MED ORDER — PROCHLORPERAZINE MALEATE 10 MG PO TABS: 10.0000 mg | ORAL_TABLET | Freq: Four times a day (QID) | ORAL | 3 refills | Status: DC | PRN

## 2021-10-25 MED ORDER — ONDANSETRON HCL 4 MG PO TABS: 4.0000 mg | ORAL_TABLET | ORAL | 3 refills | Status: DC | PRN

## 2021-10-25 NOTE — Assessment & Plan Note (Addendum)
A 70 y.o. 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 She also has had subcarinal nodal disease. She will proceed with her11th cycle of Enhertu this week.  Clinically, she appears to be doing well.  I will see her back in 3 weeks before she heads into her 12thcycle of treatment. The patient understands all the plans discussed today and is in agreement with them.

## 2021-10-25 NOTE — Progress Notes (Cosign Needed)
Patient Care Team: Marguerita Merles, MD as PCP - General (Family Medicine) Marice Potter, MD as PCP - Hematology/Oncology (Oncology)  Clinic Day:  10/25/2021  Referring physician: Marguerita Merles, MD  ASSESSMENT & PLAN:   Assessment & Plan: Malignant neoplasm of upper-outer quadrant of left female breast [facility])  A 70 y.o. 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  She also has had subcarinal nodal disease. She will proceed with her 11th cycle of Enhertu this week.  Clinically, she appears to be doing well.  I will see her back in 3 weeks before she heads into her 12th cycle of treatment.  The patient understands all the plans discussed today and is in agreement with them.    This visit was conducted with an interpreter.     The patient understands the plans discussed today and is in agreement with them.  She knows to contact our office if she develops concerns prior to her next appointment.    Melodye Ped, NP  [facility] [address] [place] [address] Dept: [phone] Dept Fax: [phone]   No orders of the defined types were placed in this encounter.     CHIEF COMPLAINT:  CC: A 70 year old female with history of breast cancer here for 3 week evaluation  Current Treatment:  Enhertu  INTERVAL HISTORY:  Weltha is here today for repeat clinical assessment. She denies fevers or chills. She denies pain. Her appetite is good. Her weight has been stable.  I have reviewed the past medical history, past surgical history, social history and family history with the patient and they are unchanged from previous note.  ALLERGIES:  has No Known Allergies.  MEDICATIONS:  Current Outpatient Medications  Medication Sig Dispense Refill   acetaminophen (TYLENOL) 325 MG tablet Take 2 tablets (650 mg total) by mouth every 4  (four) hours as needed for mild pain (temp > 100.5).     B Complex-C (B-COMPLEX WITH VITAMIN C) tablet Take 1 tablet by mouth daily after lunch. 30 tablet 0   Cholecalciferol (VITAMIN D3 PO) Take 1 tablet by mouth daily after lunch.     dexamethasone (DECADRON) 4 MG tablet Take 1 tablet (4 mg total) by mouth daily. 3 tablet 0   docusate sodium (COLACE) 100 MG capsule Take 1 capsule (100 mg total) by mouth 2 (two) times daily. 10 capsule 0   HYDROcodone-acetaminophen (NORCO/VICODIN) 5-325 MG tablet Take 1 tablet by mouth every 8 (eight) hours as needed for moderate pain. 30 tablet 0   magnesium oxide (MAG-OX) 400 MG tablet Take 1 tablet (400 mg total) by mouth daily after lunch. 30 tablet 0   meclizine (ANTIVERT) 12.5 MG tablet Take 1 tablet (12.5 mg total) by mouth 2 (two) times daily as needed for dizziness. 30 tablet 0   Omega-3 Fatty Acids (OMEGA 3 PO) Take 2 capsules by mouth daily after lunch.     ondansetron (ZOFRAN) 4 MG tablet Take 1 tablet (4 mg total) by mouth every 4 (four) hours as needed for nausea. 90 tablet 3   pantoprazole (PROTONIX) 40 MG tablet Take 1 tablet (40 mg total) by mouth at bedtime. 30 tablet 0   polyethylene glycol (MIRALAX / GLYCOLAX) 17 g packet Take 17 g by mouth 2 (two) times daily as needed for moderate constipation or severe constipation. 14 each 0   Potassium 99  MG TABS Take 1 tablet (99 mg total) by mouth daily after lunch. 20 tablet 0   prochlorperazine (COMPAZINE) 10 MG tablet Take 1 tablet (10 mg total) by mouth every 6 (six) hours as needed for nausea or vomiting. 90 tablet 3   senna (SENOKOT) 8.6 MG TABS tablet Take 2 tablets (17.2 mg total) by mouth at bedtime. 120 tablet 0   No current facility-administered medications for this visit.    HISTORY OF PRESENT ILLNESS:   Oncology History  Malignant neoplasm of upper-outer quadrant of left female breast ([facility])  07/15/2020 Initial Diagnosis   Malignant neoplasm of upper-outer quadrant of left female breast  ([facility])   03/21/2021 -  Chemotherapy   Patient is on Treatment Plan : BREAST METASTATIC fam-trastuzumab deruxtecan-nxki (Enhertu) q21d         REVIEW OF SYSTEMS:   Constitutional: Denies fevers, chills or abnormal weight loss Eyes: Denies blurriness of vision Ears, nose, mouth, throat, and face: Denies mucositis or sore throat Respiratory: Denies cough, dyspnea or wheezes Cardiovascular: Denies palpitation, chest discomfort or lower extremity swelling Gastrointestinal:  Denies nausea, heartburn or change in bowel habits Skin: Denies abnormal skin rashes Lymphatics: Denies new lymphadenopathy or easy bruising Neurological:Denies numbness, tingling or new weaknesses Behavioral/Psych: Mood is stable, no new changes  All other systems were reviewed with the patient and are negative.   VITALS:  Blood pressure 109/71, pulse 72, temperature (!) 97.4 F (36.3 C), temperature source Oral, resp. rate 18, height 5' 1" (1.549 m), weight 145 lb 9.6 oz (66 kg), SpO2 96 %.  Wt Readings from Last 3 Encounters:  10/25/21 145 lb 9.6 oz (66 kg)  10/05/21 148 lb 12 oz (67.5 kg)  10/03/21 150 lb 9.6 oz (68.3 kg)    Body mass index is 27.51 kg/m.  Performance status (ECOG): 1 - Symptomatic but completely ambulatory  PHYSICAL EXAM:   GENERAL:alert, no distress and comfortable SKIN: skin color, texture, turgor are normal, no rashes or significant lesions EYES: normal, Conjunctiva are pink and non-injected, sclera clear OROPHARYNX:no exudate, no erythema and lips, buccal mucosa, and tongue normal  NECK: supple, thyroid normal size, non-tender, without nodularity LYMPH:  no palpable lymphadenopathy in the cervical, axillary or inguinal LUNGS: clear to auscultation and percussion with normal breathing effort HEART: regular rate & rhythm and no murmurs and no lower extremity edema ABDOMEN:abdomen soft, non-tender and normal bowel sounds Musculoskeletal:no cyanosis of digits and no clubbing  NEURO:  alert & oriented x 3 with fluent speech, no focal motor/sensory deficits  LABORATORY DATA:  I have reviewed the data as listed    Component Value Date/Time   NA 139 10/25/2021 0000   K 3.8 10/25/2021 0000   CL 110 (A) 10/25/2021 0000   CO2 23 (A) 10/25/2021 0000   GLUCOSE 109 (H) 02/16/2021 1122   BUN 12 10/25/2021 0000   CREATININE 0.6 10/25/2021 0000   CREATININE 0.70 02/16/2021 1122   CALCIUM 9.2 10/25/2021 0000   PROT 7.2 02/13/2021 0457   ALBUMIN 3.6 10/25/2021 0000   AST 50 (A) 10/25/2021 0000   ALT 32 10/25/2021 0000   ALKPHOS 110 10/25/2021 0000   BILITOT 0.3 02/13/2021 0457   GFRNONAA >60 02/16/2021 1122    No results found for: SPEP, UPEP  Lab Results  Component Value Date   WBC 3.2 10/25/2021   NEUTROABS 1.98 10/25/2021   HGB 11.0 (A) 10/25/2021   HCT 34 (A) 10/25/2021   MCV 95 10/25/2021   PLT 228 10/25/2021  Chemistry      Component Value Date/Time   NA 139 10/25/2021 0000   K 3.8 10/25/2021 0000   CL 110 (A) 10/25/2021 0000   CO2 23 (A) 10/25/2021 0000   BUN 12 10/25/2021 0000   CREATININE 0.6 10/25/2021 0000   CREATININE 0.70 02/16/2021 1122   GLU 125 10/25/2021 0000      Component Value Date/Time   CALCIUM 9.2 10/25/2021 0000   ALKPHOS 110 10/25/2021 0000   AST 50 (A) 10/25/2021 0000   ALT 32 10/25/2021 0000   BILITOT 0.3 02/13/2021 0457       RADIOGRAPHIC STUDIES: I have personally reviewed the radiological images as listed and agreed with the findings in the report. No results found.

## 2021-10-26 ENCOUNTER — Other Ambulatory Visit: Payer: Self-pay

## 2021-10-26 ENCOUNTER — Encounter: Payer: Self-pay | Admitting: Oncology

## 2021-10-26 ENCOUNTER — Telehealth: Payer: Self-pay | Admitting: Oncology

## 2021-10-26 ENCOUNTER — Inpatient Hospital Stay: Payer: Self-pay

## 2021-10-26 VITALS — BP 113/61 | HR 101 | Temp 97.9°F | Resp 18 | Ht 61.0 in | Wt 146.0 lb

## 2021-10-26 DIAGNOSIS — Z171 Estrogen receptor negative status [ER-]: Secondary | ICD-10-CM

## 2021-10-26 MED ORDER — SODIUM CHLORIDE 0.9% FLUSH
10.0000 mL | INTRAVENOUS | Status: DC | PRN
  Administered 2021-10-26: 10 mL

## 2021-10-26 MED ORDER — FAM-TRASTUZUMAB DERUXTECAN-NXKI CHEMO 100 MG IV SOLR
3.4000 mg/kg | Freq: Once | INTRAVENOUS | Status: AC
  Administered 2021-10-26: 240 mg via INTRAVENOUS
  Filled 2021-10-26: qty 12

## 2021-10-26 MED ORDER — DEXTROSE 5 % IV SOLN: Freq: Once | INTRAVENOUS | Status: AC

## 2021-10-26 MED ORDER — ACETAMINOPHEN 325 MG PO TABS
650.0000 mg | ORAL_TABLET | Freq: Once | ORAL | Status: AC
  Administered 2021-10-26: 650 mg via ORAL
  Filled 2021-10-26: qty 2

## 2021-10-26 MED ORDER — DIPHENHYDRAMINE HCL 25 MG PO CAPS
50.0000 mg | ORAL_CAPSULE | Freq: Once | ORAL | Status: AC
  Administered 2021-10-26: 50 mg via ORAL
  Filled 2021-10-26: qty 2

## 2021-10-26 MED ORDER — SODIUM CHLORIDE 0.9 % IV SOLN
10.0000 mg | Freq: Once | INTRAVENOUS | Status: AC
  Administered 2021-10-26: 10 mg via INTRAVENOUS
  Filled 2021-10-26: qty 1

## 2021-10-26 MED ORDER — PALONOSETRON HCL INJECTION 0.25 MG/5ML
0.2500 mg | Freq: Once | INTRAVENOUS | Status: AC
  Administered 2021-10-26: 0.25 mg via INTRAVENOUS
  Filled 2021-10-26: qty 5

## 2021-10-26 MED ORDER — HEPARIN SOD (PORK) LOCK FLUSH 100 UNIT/ML IV SOLN
500.0000 [IU] | Freq: Once | INTRAVENOUS | Status: AC | PRN
  Administered 2021-10-26: 500 [IU]

## 2021-10-26 NOTE — Telephone Encounter (Signed)
Patient's daughter called to verify today's appt

## 2021-10-26 NOTE — Patient Instructions (Signed)
Fam-Trastuzumab deruxtecan injection What is this medication? TRASTUZUMAB DERUXTECAN (tras TOOZ eu mab DER ux TEE kan) is a chemotherapy medicine and a monoclonal antibody. It treats certain types of cancer. Some of the cancers treated are breast cancer and gastric cancer. This medicine may be used for other purposes; ask your health care provider or pharmacist if you have questions. COMMON BRAND NAME(S): ENHERTU What should I tell my care team before I take this medication? They need to know if you have any of these conditions: heart disease heart failure infection (especially a virus infection such as chickenpox, cold sores, or herpes) liver disease lung or breathing disease, like asthma an unusual or allergic reaction to fam-trastuzumab deruxtecan, other medications, foods, dyes, or preservatives pregnant or trying to get pregnant breast-feeding How should I use this medication? This medicine is for infusion into a vein. It is given by a health care professional in a hospital or clinic setting. Talk to your pediatrician regarding the use of this medicine in children. Special care may be needed. Overdosage: If you think you have taken too much of this medicine contact a poison control center or emergency room at once. NOTE: This medicine is only for you. Do not share this medicine with others. What if I miss a dose? It is important not to miss your dose. Call your doctor or health care professional if you are unable to keep an appointment. What may interact with this medication? Interaction studies have not been performed. This list may not describe all possible interactions. Give your health care provider a list of all the medicines, herbs, non-prescription drugs, or dietary supplements you use. Also tell them if you smoke, drink alcohol, or use illegal drugs. Some items may interact with your medicine. What should I watch for while using this medication? Visit your healthcare  professional for regular checks on your progress. Tell your healthcare professional if your symptoms do not start to get better or if they get worse. Your condition will be monitored carefully while you are receiving this medicine. Do not become pregnant while taking this medicine or for 7 months after stopping it. Women should inform their healthcare professional if they wish to become pregnant or think they might be pregnant. Men should not father a child while taking this medicine and for 4 months after stopping it. There is potential for serious side effects to an unborn child. Talk to your healthcare professional for more information. Do not breast-feed an infant while taking this medicine or for 7 months after the last dose. This medicine has caused decreased sperm counts in some men. This may make it more difficult to father a child. Talk to your healthcare professional if you are concerned about your fertility. This medicine may increase your risk to bruise or bleed. Call your health care professional if you notice any unusual bleeding. Be careful brushing or flossing your teeth or using a toothpick because you may get an infection or bleed more easily. If you have any dental work done, tell your dentist you are receiving this medicine. This medicine may cause dry eyes [and blurred vision]. If you wear contact lenses, you may feel some discomfort. Lubricating eye drops may help. See your healthcare professional if the problem does not go away or is severe. Call your healthcare professional for advice if you get a fever, chills, or sore throat, or other symptoms of a cold or flu. Do not treat yourself. This medicine decreases your body's ability to fight infections.  Try to avoid being around people who are sick. Avoid taking medicines that contain aspirin, acetaminophen, ibuprofen, naproxen, or ketoprofen unless instructed by your healthcare professional. These medicines may hide a fever. What side  effects may I notice from receiving this medication? Side effects that you should report to your doctor or health care professional as soon as possible: allergic reactions like skin rash, itching or hives, swelling of the face, lips, or tongue breathing problems cough nausea, vomiting signs and symptoms of bleeding such as bloody or black, tarry stools; red or dark-brown urine; spitting up blood or brown material that looks like coffee grounds; red spots on the skin; unusual bruising or bleeding from the eye, gums, or nose signs and symptoms of heart failure like breathing problems, fast, irregular heartbeat, sudden weight gain; swelling of the ankles, feet, hands; unusually weak or tired signs and symptoms of infection like fever; chills; cough; sore throat; pain or trouble passing urine signs and symptoms of low red blood cells or anemia such as unusually weak or tired; feeling faint or lightheaded; falls; breathing problems Side effects that usually do not require medical attention (report these to your doctor or health care professional if they continue or are bothersome): constipation diarrhea dry eyes hair loss loss of appetite mouth sores rash This list may not describe all possible side effects. Call your doctor for medical advice about side effects. You may report side effects to FDA at 1-800-FDA-1088. Where should I keep my medication? This drug is given in a hospital or clinic and will not be stored at home. NOTE: This sheet is a summary. It may not cover all possible information. If you have questions about this medicine, talk to your doctor, pharmacist, or health care provider.  2022 Elsevier/Gold Standard (2021-07-04 00:00:00)

## 2021-11-06 NOTE — Progress Notes (Signed)
Sent in request for DOS 11/15/21 and 11/16/21 to [employer].

## 2021-11-09 NOTE — Progress Notes (Signed)
[facility]  [address] [place],  [place]  [address] [phone]  Clinic Day:  11/15/2021  Referring physician: Marguerita Merles, MD  This document serves as a record of services personally performed by [name], MD. It was created on their behalf by [facility] E, a trained medical scribe. The creation of this record is based on the scribe's personal observations and the provider's statements to them.  HISTORY OF PRESENT ILLNESS:  The patient is a 71 y.o. 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  She also has a subcarinal lymph node for which scans have shown a positive response to therapy.  She comes in today prior to her 12th cycle of Enhertu.  The patient states that she tolerated her 11th cycle of treatment relatively well.  She continues to have occasional headaches and nausea, which have been manageable. She denies having any new symptoms which concern her for disease progression.    VITALS:  Blood pressure 117/71, pulse 68, temperature 97.7 F (36.5 C), resp. rate 14, height 5' 1"  (1.549 m), weight 146 lb 1.6 oz (66.3 kg), SpO2 95 %.  Wt Readings from Last 3 Encounters:  11/15/21 146 lb 1.6 oz (66.3 kg)  10/26/21 146 lb (66.2 kg)  10/25/21 145 lb 9.6 oz (66 kg)    Body mass index is 27.61 kg/m.  Performance status (ECOG): 1 - Symptomatic but completely ambulatory  PHYSICAL EXAM:  Physical Exam Constitutional:      General: She is not in acute distress.    Appearance: Normal appearance. She is normal weight.  HENT:     Head: Normocephalic and atraumatic.  Eyes:     General: No scleral icterus.    Extraocular Movements: Extraocular movements intact.     Conjunctiva/sclera: Conjunctivae normal.     Pupils: Pupils are equal, round, and reactive to light.  Cardiovascular:     Rate and Rhythm: Normal rate and regular  rhythm.     Pulses: Normal pulses.     Heart sounds: Normal heart sounds. No murmur heard.   No friction rub. No gallop.  Pulmonary:     Effort: Pulmonary effort is normal. No respiratory distress.     Breath sounds: Normal breath sounds.  Abdominal:     General: Bowel sounds are normal. There is no distension.     Palpations: Abdomen is soft. There is no hepatomegaly, splenomegaly or mass.     Tenderness: There is no abdominal tenderness.  Musculoskeletal:        General: Normal range of motion.     Cervical back: Normal range of motion and neck supple.     Right lower leg: No edema.     Left lower leg: No edema.  Lymphadenopathy:     Cervical: No cervical adenopathy.  Skin:    General: Skin is warm and dry.  Neurological:     General: No focal deficit present.     Mental Status: She is alert and oriented to person, place, and time. Mental status is at baseline.  Psychiatric:        Mood and Affect: Mood normal.        Behavior: Behavior normal.        Thought Content: Thought content normal.        Judgment: Judgment normal.   LABS:  Latest Reference Range & Units 11/15/21 00:00  Sodium 137 -  147  140 (E)  Potassium 3.4 - 5.3  3.7 (E)  Chloride 99 - 108  110 ! (E)  CO2 13 - 22  20 (E)  Glucose  104 (E)  BUN 4 - 21  12 (E)  Creatinine 0.5 - 1.1  0.6 (E)  Calcium 8.7 - 10.7  9.0 (E)  Alkaline Phosphatase 25 - 125  107 (E)  Albumin 3.5 - 5.0  3.7 (E)  AST 13 - 35  46 ! (E)  ALT 7 - 35  30 (E)  Bilirubin, Total  0.6 (E)  WBC  3.0 (E)  RBC 3.87 - 5.11  3.74 ! (E)  Hemoglobin 12.0 - 16.0  11.6 ! (E)  HCT 36 - 46  36 (E)  MCV 81 - 99  96 (E)  Platelets 150 - 399  158 (E)  NEUT#  1.77 (E)  !: Data is abnormal (E): External lab result  ASSESSMENT & PLAN:  Assessment/Plan:  A 71 y.o. 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  She also has had subcarinal nodal disease. She will proceed  with her 12th cycle of Enhertu this week.  Clinically, she appears to be doing well.  I will see her back in 3 weeks before she heads into her 13th cycle of treatment.  A chest CT will be done a day before her next visit to ascertain her new disease baseline after 12 cycles of Enhertu.  The patient understands all the plans discussed today and is in agreement with them.    I, Rita Ohara, am acting as scribe for Marice Potter, MD    I have reviewed this report as typed by the medical scribe, and it is complete and accurate.  [name], MD

## 2021-11-15 ENCOUNTER — Inpatient Hospital Stay (HOSPITAL_BASED_OUTPATIENT_CLINIC_OR_DEPARTMENT_OTHER): Payer: Self-pay | Admitting: Oncology

## 2021-11-15 ENCOUNTER — Other Ambulatory Visit: Payer: Self-pay | Admitting: Oncology

## 2021-11-15 ENCOUNTER — Inpatient Hospital Stay: Payer: Self-pay | Attending: Oncology

## 2021-11-15 ENCOUNTER — Telehealth: Payer: Self-pay | Admitting: Oncology

## 2021-11-15 ENCOUNTER — Other Ambulatory Visit: Payer: Self-pay

## 2021-11-15 ENCOUNTER — Other Ambulatory Visit: Payer: Self-pay | Admitting: Hematology and Oncology

## 2021-11-15 VITALS — BP 117/71 | HR 68 | Temp 97.7°F | Resp 14 | Ht 61.0 in | Wt 146.1 lb

## 2021-11-15 DIAGNOSIS — C771 Secondary and unspecified malignant neoplasm of intrathoracic lymph nodes: Secondary | ICD-10-CM | POA: Insufficient documentation

## 2021-11-15 DIAGNOSIS — C7931 Secondary malignant neoplasm of brain: Secondary | ICD-10-CM

## 2021-11-15 DIAGNOSIS — Z79899 Other long term (current) drug therapy: Secondary | ICD-10-CM | POA: Insufficient documentation

## 2021-11-15 DIAGNOSIS — Z171 Estrogen receptor negative status [ER-]: Secondary | ICD-10-CM

## 2021-11-15 DIAGNOSIS — C50912 Malignant neoplasm of unspecified site of left female breast: Secondary | ICD-10-CM

## 2021-11-15 DIAGNOSIS — C50412 Malignant neoplasm of upper-outer quadrant of left female breast: Secondary | ICD-10-CM

## 2021-11-15 DIAGNOSIS — Z5112 Encounter for antineoplastic immunotherapy: Secondary | ICD-10-CM | POA: Insufficient documentation

## 2021-11-15 LAB — CBC AND DIFFERENTIAL
HCT: 36 (ref 36–46)
Hemoglobin: 11.6 — AB (ref 12.0–16.0)
Neutrophils Absolute: 1.77
Platelets: 158 (ref 150–399)
WBC: 3

## 2021-11-15 LAB — COMPREHENSIVE METABOLIC PANEL
Albumin: 3.7 (ref 3.5–5.0)
Calcium: 9 (ref 8.7–10.7)

## 2021-11-15 LAB — CBC
MCV: 96 (ref 81–99)
RBC: 3.74 — AB (ref 3.87–5.11)

## 2021-11-15 LAB — BASIC METABOLIC PANEL
BUN: 12 (ref 4–21)
CO2: 20 (ref 13–22)
Chloride: 110 — AB (ref 99–108)
Creatinine: 0.6 (ref 0.5–1.1)
Glucose: 104
Potassium: 3.7 (ref 3.4–5.3)
Sodium: 140 (ref 137–147)

## 2021-11-15 LAB — HEPATIC FUNCTION PANEL
ALT: 30 (ref 7–35)
AST: 46 — AB (ref 13–35)
Alkaline Phosphatase: 107 (ref 25–125)
Bilirubin, Total: 0.6

## 2021-11-15 NOTE — Telephone Encounter (Signed)
Per 1/18 los next appt/scans scheduled and confirmed with patient

## 2021-11-16 ENCOUNTER — Inpatient Hospital Stay: Payer: Self-pay

## 2021-11-16 VITALS — BP 111/68 | HR 97 | Temp 98.0°F | Resp 18 | Ht 61.0 in | Wt 146.0 lb

## 2021-11-16 DIAGNOSIS — C50412 Malignant neoplasm of upper-outer quadrant of left female breast: Secondary | ICD-10-CM

## 2021-11-16 MED ORDER — FAM-TRASTUZUMAB DERUXTECAN-NXKI CHEMO 100 MG IV SOLR
3.4000 mg/kg | Freq: Once | INTRAVENOUS | Status: AC
  Administered 2021-11-16: 240 mg via INTRAVENOUS
  Filled 2021-11-16: qty 12

## 2021-11-16 MED ORDER — ACETAMINOPHEN 325 MG PO TABS
650.0000 mg | ORAL_TABLET | Freq: Once | ORAL | Status: AC
  Administered 2021-11-16: 650 mg via ORAL
  Filled 2021-11-16: qty 2

## 2021-11-16 MED ORDER — SODIUM CHLORIDE 0.9% FLUSH
10.0000 mL | INTRAVENOUS | Status: DC | PRN
  Administered 2021-11-16: 10 mL

## 2021-11-16 MED ORDER — SODIUM CHLORIDE 0.9 % IV SOLN
10.0000 mg | Freq: Once | INTRAVENOUS | Status: AC
  Administered 2021-11-16: 10 mg via INTRAVENOUS
  Filled 2021-11-16: qty 1

## 2021-11-16 MED ORDER — DEXTROSE 5 % IV SOLN: Freq: Once | INTRAVENOUS | Status: AC

## 2021-11-16 MED ORDER — PALONOSETRON HCL INJECTION 0.25 MG/5ML
0.2500 mg | Freq: Once | INTRAVENOUS | Status: AC
  Administered 2021-11-16: 0.25 mg via INTRAVENOUS
  Filled 2021-11-16: qty 5

## 2021-11-16 MED ORDER — DIPHENHYDRAMINE HCL 25 MG PO CAPS
50.0000 mg | ORAL_CAPSULE | Freq: Once | ORAL | Status: AC
  Administered 2021-11-16: 50 mg via ORAL
  Filled 2021-11-16: qty 2

## 2021-11-16 MED ORDER — HEPARIN SOD (PORK) LOCK FLUSH 100 UNIT/ML IV SOLN
500.0000 [IU] | Freq: Once | INTRAVENOUS | Status: AC | PRN
  Administered 2021-11-16: 500 [IU]

## 2021-11-16 NOTE — Patient Instructions (Signed)
Fam-Trastuzumab deruxtecan injection What is this medication? TRASTUZUMAB DERUXTECAN (tras TOOZ eu mab DER ux TEE kan) is a chemotherapy medicine and a monoclonal antibody. It treats certain types of cancer. Some of the cancers treated are breast cancer and gastric cancer. This medicine may be used for other purposes; ask your health care provider or pharmacist if you have questions. COMMON BRAND NAME(S): ENHERTU What should I tell my care team before I take this medication? They need to know if you have any of these conditions: heart disease heart failure infection (especially a virus infection such as chickenpox, cold sores, or herpes) liver disease lung or breathing disease, like asthma an unusual or allergic reaction to fam-trastuzumab deruxtecan, other medications, foods, dyes, or preservatives pregnant or trying to get pregnant breast-feeding How should I use this medication? This medicine is for infusion into a vein. It is given by a health care professional in a hospital or clinic setting. Talk to your pediatrician regarding the use of this medicine in children. Special care may be needed. Overdosage: If you think you have taken too much of this medicine contact a poison control center or emergency room at once. NOTE: This medicine is only for you. Do not share this medicine with others. What if I miss a dose? It is important not to miss your dose. Call your doctor or health care professional if you are unable to keep an appointment. What may interact with this medication? Interaction studies have not been performed. This list may not describe all possible interactions. Give your health care provider a list of all the medicines, herbs, non-prescription drugs, or dietary supplements you use. Also tell them if you smoke, drink alcohol, or use illegal drugs. Some items may interact with your medicine. What should I watch for while using this medication? Visit your healthcare  professional for regular checks on your progress. Tell your healthcare professional if your symptoms do not start to get better or if they get worse. Your condition will be monitored carefully while you are receiving this medicine. Do not become pregnant while taking this medicine or for 7 months after stopping it. Women should inform their healthcare professional if they wish to become pregnant or think they might be pregnant. Men should not father a child while taking this medicine and for 4 months after stopping it. There is potential for serious side effects to an unborn child. Talk to your healthcare professional for more information. Do not breast-feed an infant while taking this medicine or for 7 months after the last dose. This medicine has caused decreased sperm counts in some men. This may make it more difficult to father a child. Talk to your healthcare professional if you are concerned about your fertility. This medicine may increase your risk to bruise or bleed. Call your health care professional if you notice any unusual bleeding. Be careful brushing or flossing your teeth or using a toothpick because you may get an infection or bleed more easily. If you have any dental work done, tell your dentist you are receiving this medicine. This medicine may cause dry eyes [and blurred vision]. If you wear contact lenses, you may feel some discomfort. Lubricating eye drops may help. See your healthcare professional if the problem does not go away or is severe. Call your healthcare professional for advice if you get a fever, chills, or sore throat, or other symptoms of a cold or flu. Do not treat yourself. This medicine decreases your body's ability to fight infections.  Try to avoid being around people who are sick. Avoid taking medicines that contain aspirin, acetaminophen, ibuprofen, naproxen, or ketoprofen unless instructed by your healthcare professional. These medicines may hide a fever. What side  effects may I notice from receiving this medication? Side effects that you should report to your doctor or health care professional as soon as possible: allergic reactions like skin rash, itching or hives, swelling of the face, lips, or tongue breathing problems cough nausea, vomiting signs and symptoms of bleeding such as bloody or black, tarry stools; red or dark-brown urine; spitting up blood or brown material that looks like coffee grounds; red spots on the skin; unusual bruising or bleeding from the eye, gums, or nose signs and symptoms of heart failure like breathing problems, fast, irregular heartbeat, sudden weight gain; swelling of the ankles, feet, hands; unusually weak or tired signs and symptoms of infection like fever; chills; cough; sore throat; pain or trouble passing urine signs and symptoms of low red blood cells or anemia such as unusually weak or tired; feeling faint or lightheaded; falls; breathing problems Side effects that usually do not require medical attention (report these to your doctor or health care professional if they continue or are bothersome): constipation diarrhea dry eyes hair loss loss of appetite mouth sores rash This list may not describe all possible side effects. Call your doctor for medical advice about side effects. You may report side effects to FDA at 1-800-FDA-1088. Where should I keep my medication? This drug is given in a hospital or clinic and will not be stored at home. NOTE: This sheet is a summary. It may not cover all possible information. If you have questions about this medicine, talk to your doctor, pharmacist, or health care provider.  2022 Elsevier/Gold Standard (2021-07-04 00:00:00)

## 2021-11-23 ENCOUNTER — Encounter: Payer: Self-pay | Admitting: Oncology

## 2021-11-24 ENCOUNTER — Encounter: Payer: Self-pay | Admitting: Oncology

## 2021-11-28 NOTE — Progress Notes (Signed)
Sent in request for DOS 12/05/2021, 12/06/2021, and 12/07/2021 to [employer].

## 2021-12-01 NOTE — Progress Notes (Signed)
[facility]  [address] [place],  [place]  [address] [phone]  Clinic Day:  12/06/2021  Referring physician: Marguerita Merles, MD  This document serves as a record of services personally performed by Abril Cappiello Macarthur Critchley, MD. It was created on their behalf by [facility] E, a trained medical scribe. The creation of this record is based on the scribe's personal observations and the provider's statements to them.  HISTORY OF PRESENT ILLNESS:  The patient is a 71 y.o. 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  She also has a subcarinal lymph node for which scans have shown a positive response to therapy.  She comes in today to go over her CT scans to ascertain her new disease baseline after 12 cycles of Enhertu.  The patient states that she tolerated her 12th cycle of treatment well.  She denies having any new symptoms or findings which concern her for possible disease progression.    VITALS:  Blood pressure 130/76, pulse 70, temperature 98.5 F (36.9 C), resp. rate 14, height $RemoveBe'5\' 1"'JrvIqMyEz$  (1.549 m), weight 145 lb 11.2 oz (66.1 kg), SpO2 96 %.  Wt Readings from Last 3 Encounters:  12/06/21 145 lb 11.2 oz (66.1 kg)  11/16/21 146 lb (66.2 kg)  11/15/21 146 lb 1.6 oz (66.3 kg)    Body mass index is 27.53 kg/m.  Performance status (ECOG): 1 - Symptomatic but completely ambulatory  PHYSICAL EXAM:  Physical Exam Constitutional:      General: She is not in acute distress.    Appearance: Normal appearance. She is normal weight.  HENT:     Head: Normocephalic and atraumatic.  Eyes:     General: No scleral icterus.    Extraocular Movements: Extraocular movements intact.     Conjunctiva/sclera: Conjunctivae normal.     Pupils: Pupils are equal, round, and reactive to light.  Cardiovascular:     Rate and Rhythm: Normal rate and regular rhythm.      Pulses: Normal pulses.     Heart sounds: Normal heart sounds. No murmur heard.   No friction rub. No gallop.  Pulmonary:     Effort: Pulmonary effort is normal. No respiratory distress.     Breath sounds: Normal breath sounds.  Abdominal:     General: Bowel sounds are normal. There is no distension.     Palpations: Abdomen is soft. There is no hepatomegaly, splenomegaly or mass.     Tenderness: There is no abdominal tenderness.  Musculoskeletal:        General: Normal range of motion.     Cervical back: Normal range of motion and neck supple.     Right lower leg: No edema.     Left lower leg: No edema.  Lymphadenopathy:     Cervical: No cervical adenopathy.  Skin:    General: Skin is warm and dry.  Neurological:     General: No focal deficit present.     Mental Status: She is alert and oriented to person, place, and time. Mental status is at baseline.  Psychiatric:        Mood and Affect: Mood normal.        Behavior: Behavior normal.        Thought Content: Thought content normal.        Judgment: Judgment normal.   SCANS: Recent CT imaging has revealed the following: FINDINGS:  Cardiovascular: Heart size  is normal. There is no significant  pericardial fluid, thickening or pericardial calcification. Aortic  atherosclerosis. No definite coronary artery calcifications. Right  internal jugular single-lumen porta cath with tip terminating in the  distal superior vena cava.  Mediastinum/Nodes: No pathologically enlarged mediastinal, internal  mammary or hilar lymph nodes. Large hiatal hernia. No axillary  lymphadenopathy. Surgical clips in the left axilla from prior lymph  node dissection.  Lungs/Pleura: No suspicious appearing pulmonary nodules or masses  are noted. No acute consolidative airspace disease. No pleural  effusions.  Upper Abdomen: Multiple low-attenuation lesions are again noted in  the liver, largest of which is located predominantly in segment 4A  measuring  4.8 x 4.2 cm. Aortic atherosclerosis.  Musculoskeletal: There are no aggressive appearing lytic or blastic  lesions noted in the visualized portions of the skeleton.   IMPRESSION:  1. No evidence to suggest metastatic disease in the thorax.  2. Aortic atherosclerosis.  Aortic Atherosclerosis (ICD10-I70.0).   LABS:  Latest Reference Range & Units 12/06/21 00:00  WBC  2.6 (E)  RBC 3.87 - 5.11  3.64 ! (E)  Hemoglobin 12.0 - 16.0  11.5 ! (E)  HCT 36 - 46  34 ! (E)  Platelets 150 - 399  183 (E)  NEUT#  1.38 (E)  !: Data is abnormal (E): External lab result  ASSESSMENT & PLAN:  Assessment/Plan:  A 71 y.o. 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  She also has had subcarinal nodal disease.  In clinic today, I went over all of her CT scan images with her, for which she could see that she remains disease-free.  Based upon that, she will proceed with her 13th cycle of Enhertu this week.  Clinically, she appears to be doing well.  I will see her back in 3 weeks before she heads into her 14th cycle of treatment.  The patient understands all the plans discussed today and is in agreement with them.    I, Rita Ohara, am acting as scribe for Marice Potter, MD    I have reviewed this report as typed by the medical scribe, and it is complete and accurate.  Jearline Hirschhorn Macarthur Critchley, MD

## 2021-12-04 ENCOUNTER — Encounter: Payer: Self-pay | Admitting: Oncology

## 2021-12-05 ENCOUNTER — Encounter: Payer: Self-pay | Admitting: Oncology

## 2021-12-05 ENCOUNTER — Other Ambulatory Visit: Payer: Self-pay | Admitting: Oncology

## 2021-12-06 ENCOUNTER — Other Ambulatory Visit: Payer: Self-pay | Admitting: Hematology and Oncology

## 2021-12-06 ENCOUNTER — Other Ambulatory Visit: Payer: Self-pay | Admitting: Oncology

## 2021-12-06 ENCOUNTER — Telehealth: Payer: Self-pay | Admitting: Oncology

## 2021-12-06 ENCOUNTER — Inpatient Hospital Stay: Payer: Self-pay | Attending: Oncology | Admitting: Oncology

## 2021-12-06 ENCOUNTER — Other Ambulatory Visit: Payer: Self-pay

## 2021-12-06 ENCOUNTER — Inpatient Hospital Stay: Payer: Self-pay

## 2021-12-06 VITALS — BP 130/76 | HR 70 | Temp 98.5°F | Resp 14 | Ht 61.0 in | Wt 145.7 lb

## 2021-12-06 DIAGNOSIS — C50912 Malignant neoplasm of unspecified site of left female breast: Secondary | ICD-10-CM

## 2021-12-06 DIAGNOSIS — Z79899 Other long term (current) drug therapy: Secondary | ICD-10-CM | POA: Insufficient documentation

## 2021-12-06 DIAGNOSIS — C771 Secondary and unspecified malignant neoplasm of intrathoracic lymph nodes: Secondary | ICD-10-CM | POA: Insufficient documentation

## 2021-12-06 DIAGNOSIS — Z171 Estrogen receptor negative status [ER-]: Secondary | ICD-10-CM | POA: Insufficient documentation

## 2021-12-06 DIAGNOSIS — C7931 Secondary malignant neoplasm of brain: Secondary | ICD-10-CM | POA: Insufficient documentation

## 2021-12-06 DIAGNOSIS — Z5112 Encounter for antineoplastic immunotherapy: Secondary | ICD-10-CM | POA: Insufficient documentation

## 2021-12-06 DIAGNOSIS — C50412 Malignant neoplasm of upper-outer quadrant of left female breast: Secondary | ICD-10-CM | POA: Insufficient documentation

## 2021-12-06 LAB — CBC AND DIFFERENTIAL
HCT: 34 — AB (ref 36–46)
Hemoglobin: 11.5 — AB (ref 12.0–16.0)
Neutrophils Absolute: 1.38
Platelets: 183 (ref 150–399)
WBC: 2.6

## 2021-12-06 LAB — CBC: RBC: 3.64 — AB (ref 3.87–5.11)

## 2021-12-06 MED FILL — Fam-Trastuzumab Deruxtecan-nxki For IV Soln 100 MG: INTRAVENOUS | Qty: 12 | Status: AC

## 2021-12-06 NOTE — Telephone Encounter (Signed)
Per 12/06/21 los next appt scheduled and confirmed with patient

## 2021-12-06 NOTE — Progress Notes (Signed)
Proceed with chemo despite ANC=1380 per Dr. Bobby Rumpf.

## 2021-12-07 ENCOUNTER — Inpatient Hospital Stay: Payer: Self-pay

## 2021-12-07 VITALS — BP 128/70 | HR 77 | Temp 98.1°F | Resp 18 | Ht 61.0 in | Wt 145.0 lb

## 2021-12-07 DIAGNOSIS — C50412 Malignant neoplasm of upper-outer quadrant of left female breast: Secondary | ICD-10-CM

## 2021-12-07 DIAGNOSIS — Z171 Estrogen receptor negative status [ER-]: Secondary | ICD-10-CM

## 2021-12-07 MED ORDER — DEXTROSE 5 % IV SOLN: Freq: Once | INTRAVENOUS | Status: AC

## 2021-12-07 MED ORDER — DIPHENHYDRAMINE HCL 25 MG PO CAPS
50.0000 mg | ORAL_CAPSULE | Freq: Once | ORAL | Status: AC
  Administered 2021-12-07: 50 mg via ORAL
  Filled 2021-12-07: qty 2

## 2021-12-07 MED ORDER — SODIUM CHLORIDE 0.9 % IV SOLN
10.0000 mg | Freq: Once | INTRAVENOUS | Status: AC
  Administered 2021-12-07: 10 mg via INTRAVENOUS
  Filled 2021-12-07: qty 1

## 2021-12-07 MED ORDER — PALONOSETRON HCL INJECTION 0.25 MG/5ML
0.2500 mg | Freq: Once | INTRAVENOUS | Status: AC
  Administered 2021-12-07: 0.25 mg via INTRAVENOUS
  Filled 2021-12-07: qty 5

## 2021-12-07 MED ORDER — FAM-TRASTUZUMAB DERUXTECAN-NXKI CHEMO 100 MG IV SOLR
3.4000 mg/kg | Freq: Once | INTRAVENOUS | Status: AC
  Administered 2021-12-07: 240 mg via INTRAVENOUS
  Filled 2021-12-07: qty 12

## 2021-12-07 MED ORDER — SODIUM CHLORIDE 0.9% FLUSH
10.0000 mL | INTRAVENOUS | Status: DC | PRN
  Administered 2021-12-07: 10 mL

## 2021-12-07 MED ORDER — HEPARIN SOD (PORK) LOCK FLUSH 100 UNIT/ML IV SOLN
500.0000 [IU] | Freq: Once | INTRAVENOUS | Status: AC | PRN
  Administered 2021-12-07: 500 [IU]

## 2021-12-07 MED ORDER — ACETAMINOPHEN 325 MG PO TABS
650.0000 mg | ORAL_TABLET | Freq: Once | ORAL | Status: AC
  Administered 2021-12-07: 650 mg via ORAL
  Filled 2021-12-07: qty 2

## 2021-12-07 NOTE — Patient Instructions (Signed)
Fam-Trastuzumab deruxtecan injection Qu es este medicamento? TRASTUZUMAB DERUXTECAN es un medicamento quimioteraputico y un anticuerpo monoclonal. Trata ciertos tipos de cncer. Algunos de los tipos de cncer tratados son cncer de mama y cncer gstrico. Este medicamento puede ser utilizado para otros usos; si tiene alguna pregunta consulte con su proveedor de atencin mdica o con su farmacutico. MARCAS COMUNES: ENHERTU Qu le debo informar a mi profesional de la salud antes de tomar este medicamento? Necesitan saber si usted presenta alguno de los siguientes problemas o situaciones: enfermedad cardiaca insuficiencia cardiaca infeccin (especialmente infecciones virales, como varicela, fuegos labiales o herpes) enfermedad heptica enfermedad pulmonar o respiratoria, como asma una reaccin alrgica o inusual al fam-trastuzumab deruxtecan, a otros medicamentos, alimentos, colorantes o conservantes si est embarazada o buscando quedar embarazada si est amamantando a un beb Cmo debo utilizar este medicamento? Este medicamento se administra mediante infusin en una vena. Lo administra un profesional de Technical sales engineer en un hospital o en un entorno clnico. Hable con su pediatra para informarse acerca del uso de este medicamento en nios. Puede requerir atencin especial. Sobredosis: Pngase en contacto inmediatamente con un centro toxicolgico o una sala de urgencia si usted cree que haya tomado demasiado medicamento. ATENCIN: [employer] es solo para usted. No comparta este medicamento con nadie. Qu sucede si me olvido de una dosis? Es importante no olvidar ninguna dosis. Informe a su mdico o a su profesional de la salud si no puede asistir a Photographer. Qu puede interactuar con este medicamento? No se han realizado estudios de interaccin. Puede ser que esta lista no menciona todas las posibles interacciones. Informe a su profesional de [employer]	[place] de [employer] productos a base de  hierbas, medicamentos de [place] o suplementos nutritivos que est tomando. Si usted fuma, consume bebidas alcohlicas o si utiliza drogas ilegales, indqueselo tambin a su profesional de [employer]	[place]. Algunas sustancias pueden interactuar con su medicamento. A qu debo estar atento al usar [employer]? Visite a su profesional de la salud para que revise regularmente su evolucin. Informe a su profesional de la salud si sus sntomas no comienzan a mejorar o si empeoran. Se supervisar su estado de salud atentamente mientras reciba este medicamento. No debe quedar embarazada mientras est usando este medicamento o por 7 meses despus de dejar de usarlo. Las mujeres deben informar a su profesional de la salud si estn buscando quedar embarazadas o si creen que podran estar embarazadas. Los hombres no deben Social worker a Social research officer, government estn recibiendo [employer] y [place] 4 meses despus de dejar de usarlo. Existe la posibilidad de que ocurran efectos secundarios graves en un beb sin nacer. Para obtener ms informacin, hable con su profesional de [employer]	[place]. No debe amamantar a un beb mientras est usando este medicamento o durante 7 meses despus de la ltima dosis. Este medicamento ha causado recuentos de esperma reducidos en algunos hombres. Esto puede hacer ms difcil que un hombre embarace a Musician. Hable con su profesional de la salud si le preocupa su fertilidad. Este medicamento podra aumentar el riesgo de moretones o sangrado. Consulte a su profesional de la salud si observa sangrados inusuales. Proceda con cuidado al cepillar sus dientes, usar hilo dental o Risk manager palillos para los dientes, ya que podra contraer una infeccin o Therapist, art con mayor facilidad. Si se somete a algn tratamiento dental, informe a su dentista que est [employer]. Este medicamento puede resecarle los ojos [y provocar visin borrosa]. Si [place] lentes de contacto,  puede sentir ciertas  molestias. Las gotas lubricantes para los ojos pueden ser tiles. Si el problema no desaparece o es severo, consulte a su profesional de [employer]	[place]. Consulte a su profesional de la salud si tiene fiebre, escalofros, dolor de garganta o cualquier otro sntoma de resfro o gripe. No se trate usted mismo. Este medicamento reduce la capacidad del cuerpo para combatir infecciones. Trate de no acercarse a personas que estn enfermas. Evite usar [employer] contienen aspirina, acetaminofeno, ibuprofeno, naproxeno o ketoprofeno, a menos que as lo indique su profesional de [employer]	[place]. Estos productos pueden ocultar la fiebre. Qu efectos secundarios puedo tener al [employer] este medicamento? Efectos secundarios que debe informar a su mdico o a Barrister's clerk de la salud tan pronto como sea posible: Chief of Staff, tales como erupcin cutnea, comezn/picazn o urticaria, e hinchazn de la cara, los labios o la lengua problemas para respirar tos nuseas, vmito signos y sntomas de sangrado, tales como heces con sangre o de color negro y aspecto alquitranado; [place] de color rojo o marrn oscuro; escupir sangre o material marrn que tiene el aspecto de posos (residuos) de caf; Tree surgeon rojas en la piel; sangrado o moretones inusuales en los ojos, las encas o la nariz signos y sntomas de insuficiencia cardiaca tales como problemas para respirar; frecuencia cardiaca rpida, irregular; aumento repentino de peso; hinchazn de los tobillos, pies, manos; debilidad o cansancio inusual signos y sntomas de infeccin, tales como fiebre, escalofros, tos, Social research officer, government de garganta, Social research officer, government o dificultad para orinar signos y sntomas de baja cantidad de glbulos rojos o anemia, tales como debilidad o cansancio inusuales; sensacin de desmayo o aturdimiento; cadas; problemas para respirar Efectos secundarios que generalmente no requieren atencin mdica (debe informarlos a su mdico o a su profesional de la salud si  persisten o si son molestos): estreimiento diarrea ojos secos cada del cabello prdida del apetito llagas en la boca erupcin cutnea Puede ser que esta lista no menciona todos los posibles efectos secundarios. Comunquese a su mdico por asesoramiento mdico [employer] Usted puede informar los efectos secundarios a la FDA por telfono al 1-800-FDA-1088. Dnde debo guardar mi medicina? Este medicamento se administra en hospitales o clnicas, y no necesitar guardarlo en su domicilio. ATENCIN: Este folleto es un resumen. Puede ser que no cubra toda la posible informacin. Si usted tiene preguntas acerca de esta medicina, consulte con su mdico, su farmacutico o su profesional de Technical sales engineer.  2022 Elsevier/Gold Standard (2020-04-19 00:00:00)

## 2021-12-15 NOTE — Progress Notes (Signed)
Patients daughter called in about speaking with someone regarding the non-filing letter for her Mother's taxes. She states she is unable to obtain the letter at this time. I provided her with the number to [employer].

## 2021-12-19 NOTE — Progress Notes (Signed)
Sent in request for DOS 12/27/2021 and 12/28/2021 to [employer].

## 2021-12-21 NOTE — Progress Notes (Signed)
?[facility]  ?[address] ?[place],  [place]  [address] ?[phone] ? ?Clinic Day:  12/27/2021 ? ?Referring physician: Marguerita Merles, MD ? ?This document serves as a record of services personally performed by Marice Potter, MD. It was created on their behalf by Curry,Lauren E, a trained medical scribe. The creation of this record is based on the scribe's personal observations and the provider's statements to them. ? ?HISTORY OF PRESENT ILLNESS:  ?The patient is a 71 y.o. 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  She also has a subcarinal lymph node for which scans have shown a positive response to therapy.  She comes in today prior to her4th cycle of Enhertu.  The patient states that she tolerated her 13th cycle of treatment well.  She denies having any new symptoms or findings which concern her for possible disease progression.   ? ?VITALS:  ?Blood pressure 126/82, pulse 77, temperature 97.6 ?F (36.4 ?C), temperature source Oral, resp. rate 20, height 5' 1"  (1.549 m), weight 145 lb 14.4 oz (66.2 kg), SpO2 97 %.  ?Wt Readings from Last 3 Encounters:  ?12/27/21 145 lb 14.4 oz (66.2 kg)  ?12/07/21 145 lb (65.8 kg)  ?12/06/21 145 lb 11.2 oz (66.1 kg)  ?  ?Body mass index is 27.57 kg/m?. ? ?Performance status (ECOG): 1 - Symptomatic but completely ambulatory ? ?PHYSICAL EXAM:  ?Physical Exam ?Constitutional:   ?   General: She is not in acute distress. ?   Appearance: Normal appearance. She is normal weight.  ?HENT:  ?   Head: Normocephalic and atraumatic.  ?Eyes:  ?   General: No scleral icterus. ?   Extraocular Movements: Extraocular movements intact.  ?   Conjunctiva/sclera: Conjunctivae normal.  ?   Pupils: Pupils are equal, round, and reactive to light.  ?Cardiovascular:  ?   Rate and Rhythm: Normal rate and regular rhythm.  ?   Pulses: Normal pulses.  ?   Heart  sounds: Normal heart sounds. No murmur heard. ?  No friction rub. No gallop.  ?Pulmonary:  ?   Effort: Pulmonary effort is normal. No respiratory distress.  ?   Breath sounds: Normal breath sounds.  ?Abdominal:  ?   General: Bowel sounds are normal. There is no distension.  ?   Palpations: Abdomen is soft. There is no hepatomegaly, splenomegaly or mass.  ?   Tenderness: There is no abdominal tenderness.  ?Musculoskeletal:     ?   General: Normal range of motion.  ?   Cervical back: Normal range of motion and neck supple.  ?   Right lower leg: No edema.  ?   Left lower leg: No edema.  ?Lymphadenopathy:  ?   Cervical: No cervical adenopathy.  ?Skin: ?   General: Skin is warm and dry.  ?Neurological:  ?   General: No focal deficit present.  ?   Mental Status: She is alert and oriented to person, place, and time. Mental status is at baseline.  ?Psychiatric:     ?   Mood and Affect: Mood normal.     ?   Behavior: Behavior normal.     ?   Thought Content: Thought content normal.     ?   Judgment: Judgment normal.  ? ?LABS: ? Latest Reference Range & Units 12/27/21 00:00  ?Sodium 137 - 147  140 (E)  ?Potassium 3.4 -  5.3  3.6 (E)  ?Chloride 99 - 108  110 ! (E)  ?CO2 13 - 22  22 (E)  ?Glucose  127 (E)  ?BUN 4 - 21  11 (E)  ?Creatinine 0.5 - 1.1  0.5 (E)  ?Calcium 8.7 - 10.7  9.2 (E)  ?Alkaline Phosphatase 25 - 125  121 (E)  ?Albumin 3.5 - 5.0  3.6 (E)  ?AST 13 - 35  54 ! (E)  ?ALT 7 - 35  40 ! (E)  ?Bilirubin, Total  0.5 (E)  ?WBC  2.4  ?RBC 3.87 - 5.11  3.66 !  ?Hemoglobin 12.0 - 16.0  11.5 !  ?HCT 36 - 46  35 !  ?Platelets 150 - 399  176  ?NEUT#  1.15  ?!: Data is abnormal ?(E): External lab result ? ?ASSESSMENT & PLAN:  ?Assessment/Plan:  A 71 y.o. 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  She also has had subcarinal nodal disease. She will proceed with her 14th cycle of Enhertu this week.  Clinically, she appears to be doing well.  I  will see her back in 3 weeks before she heads into her 15th cycle of treatment.  The patient understands all the plans discussed today and is in agreement with them.   ? ?I, Rita Ohara, am acting as scribe for Marice Potter, MD   ? ?I have reviewed this report as typed by the medical scribe, and it is complete and accurate. ? ?Minnie Legros Macarthur Critchley, MD ? ? ? ?  ? ?

## 2021-12-27 ENCOUNTER — Inpatient Hospital Stay: Payer: Self-pay

## 2021-12-27 ENCOUNTER — Inpatient Hospital Stay: Payer: Self-pay | Attending: Oncology | Admitting: Oncology

## 2021-12-27 ENCOUNTER — Encounter: Payer: Self-pay | Admitting: Oncology

## 2021-12-27 ENCOUNTER — Other Ambulatory Visit: Payer: Self-pay | Admitting: Hematology and Oncology

## 2021-12-27 ENCOUNTER — Other Ambulatory Visit: Payer: Self-pay

## 2021-12-27 VITALS — BP 126/82 | HR 77 | Temp 97.6°F | Resp 20 | Ht 61.0 in | Wt 145.9 lb

## 2021-12-27 DIAGNOSIS — C770 Secondary and unspecified malignant neoplasm of lymph nodes of head, face and neck: Secondary | ICD-10-CM | POA: Insufficient documentation

## 2021-12-27 DIAGNOSIS — Z5112 Encounter for antineoplastic immunotherapy: Secondary | ICD-10-CM | POA: Insufficient documentation

## 2021-12-27 DIAGNOSIS — Z79899 Other long term (current) drug therapy: Secondary | ICD-10-CM | POA: Insufficient documentation

## 2021-12-27 DIAGNOSIS — C50412 Malignant neoplasm of upper-outer quadrant of left female breast: Secondary | ICD-10-CM

## 2021-12-27 DIAGNOSIS — C50912 Malignant neoplasm of unspecified site of left female breast: Secondary | ICD-10-CM

## 2021-12-27 DIAGNOSIS — C7931 Secondary malignant neoplasm of brain: Secondary | ICD-10-CM | POA: Insufficient documentation

## 2021-12-27 DIAGNOSIS — Z171 Estrogen receptor negative status [ER-]: Secondary | ICD-10-CM

## 2021-12-27 LAB — BASIC METABOLIC PANEL
BUN: 11 (ref 4–21)
CO2: 22 (ref 13–22)
Chloride: 110 — AB (ref 99–108)
Creatinine: 0.5 (ref 0.5–1.1)
Glucose: 127
Potassium: 3.6 (ref 3.4–5.3)
Sodium: 140 (ref 137–147)

## 2021-12-27 LAB — CBC: RBC: 3.66 — AB (ref 3.87–5.11)

## 2021-12-27 LAB — CBC AND DIFFERENTIAL
HCT: 35 — AB (ref 36–46)
Hemoglobin: 11.5 — AB (ref 12.0–16.0)
Neutrophils Absolute: 1.15
Platelets: 176 (ref 150–399)
WBC: 2.4

## 2021-12-27 LAB — COMPREHENSIVE METABOLIC PANEL
Albumin: 3.6 (ref 3.5–5.0)
Calcium: 9.2 (ref 8.7–10.7)

## 2021-12-27 LAB — HEPATIC FUNCTION PANEL
ALT: 40 — AB (ref 7–35)
AST: 54 — AB (ref 13–35)
Alkaline Phosphatase: 121 (ref 25–125)
Bilirubin, Total: 0.5

## 2021-12-27 MED FILL — Dexamethasone Sodium Phosphate Inj 100 MG/10ML: INTRAMUSCULAR | Qty: 1 | Status: AC

## 2021-12-27 MED FILL — Fam-Trastuzumab Deruxtecan-nxki For IV Soln 100 MG: INTRAVENOUS | Qty: 12 | Status: AC

## 2021-12-27 NOTE — Progress Notes (Signed)
Ok to proceed with fam-trastuzumab despite ANC=1150 per Dr. Bobby Rumpf ?

## 2021-12-28 ENCOUNTER — Encounter: Payer: Self-pay | Admitting: Oncology

## 2021-12-28 ENCOUNTER — Inpatient Hospital Stay: Payer: Self-pay

## 2021-12-28 VITALS — BP 120/72 | HR 83 | Temp 97.9°F | Resp 20 | Wt 147.0 lb

## 2021-12-28 DIAGNOSIS — Z171 Estrogen receptor negative status [ER-]: Secondary | ICD-10-CM

## 2021-12-28 DIAGNOSIS — C50412 Malignant neoplasm of upper-outer quadrant of left female breast: Secondary | ICD-10-CM

## 2021-12-28 MED ORDER — FAM-TRASTUZUMAB DERUXTECAN-NXKI CHEMO 100 MG IV SOLR
3.4000 mg/kg | Freq: Once | INTRAVENOUS | Status: AC
  Administered 2021-12-28: 240 mg via INTRAVENOUS
  Filled 2021-12-28: qty 12

## 2021-12-28 MED ORDER — DEXTROSE 5 % IV SOLN: Freq: Once | INTRAVENOUS | Status: AC

## 2021-12-28 MED ORDER — SODIUM CHLORIDE 0.9% FLUSH
10.0000 mL | INTRAVENOUS | Status: DC | PRN
  Administered 2021-12-28: 10 mL

## 2021-12-28 MED ORDER — DIPHENHYDRAMINE HCL 25 MG PO CAPS
50.0000 mg | ORAL_CAPSULE | Freq: Once | ORAL | Status: AC
  Administered 2021-12-28: 50 mg via ORAL
  Filled 2021-12-28: qty 2

## 2021-12-28 MED ORDER — SODIUM CHLORIDE 0.9 % IV SOLN
10.0000 mg | Freq: Once | INTRAVENOUS | Status: AC
  Administered 2021-12-28: 10 mg via INTRAVENOUS
  Filled 2021-12-28: qty 10

## 2021-12-28 MED ORDER — HEPARIN SOD (PORK) LOCK FLUSH 100 UNIT/ML IV SOLN
500.0000 [IU] | Freq: Once | INTRAVENOUS | Status: AC | PRN
  Administered 2021-12-28: 500 [IU]

## 2021-12-28 MED ORDER — ACETAMINOPHEN 325 MG PO TABS
650.0000 mg | ORAL_TABLET | Freq: Once | ORAL | Status: AC
  Administered 2021-12-28: 650 mg via ORAL
  Filled 2021-12-28: qty 2

## 2021-12-28 MED ORDER — PALONOSETRON HCL INJECTION 0.25 MG/5ML
0.2500 mg | Freq: Once | INTRAVENOUS | Status: AC
  Administered 2021-12-28: 0.25 mg via INTRAVENOUS
  Filled 2021-12-28: qty 5

## 2021-12-28 NOTE — Patient Instructions (Signed)
[name]  Discharge Instructions: Thank you for choosing [facility] to provide your oncology and hematology care.  If you have a lab appointment with the [facility], please go directly to the [facility] and check in at the registration area.   Wear comfortable clothing and clothing appropriate for easy access to any Portacath or PICC line.   We strive to give you quality time with your provider. You may need to reschedule your appointment if you arrive late (15 or more minutes).  Arriving late affects you and other patients whose appointments are after yours.  Also, if you miss three or more appointments without notifying the office, you may be dismissed from the clinic at the providers discretion.      For prescription refill requests, have your pharmacy contact our office and allow 72 hours for refills to be completed.    Today you received the following chemotherapy and/or immunotherapy agents Enhertu      To help prevent nausea and vomiting after your treatment, we encourage you to take your nausea medication as directed.  BELOW ARE SYMPTOMS THAT SHOULD BE REPORTED IMMEDIATELY: *FEVER GREATER THAN 100.4 F (38 C) OR HIGHER *CHILLS OR SWEATING *NAUSEA AND VOMITING THAT IS NOT CONTROLLED WITH YOUR NAUSEA MEDICATION *UNUSUAL SHORTNESS OF BREATH *UNUSUAL BRUISING OR BLEEDING *URINARY PROBLEMS (pain or burning when urinating, or frequent urination) *BOWEL PROBLEMS (unusual diarrhea, constipation, pain near the anus) TENDERNESS IN MOUTH AND THROAT WITH OR WITHOUT PRESENCE OF ULCERS (sore throat, sores in mouth, or a toothache) UNUSUAL RASH, SWELLING OR PAIN  UNUSUAL VAGINAL DISCHARGE OR ITCHING   Items with * indicate a potential emergency and should be followed up as soon as possible or go to the Emergency Department if any problems should occur.  Please show the CHEMOTHERAPY ALERT CARD or IMMUNOTHERAPY ALERT CARD at check-in to the  Emergency Department and triage nurse.  Should you have questions after your visit or need to cancel or reschedule your appointment, please contact [facility]  Dept: [phone]  and follow the prompts.  Office hours are 8:00 a.m. to 4:30 p.m. Monday - Friday. Please note that voicemails left after 4:00 p.m. may not be returned until the following business day.  We are closed weekends and major holidays. You have access to a nurse at all times for urgent questions. Please call the main number to the clinic Dept: [phone] and follow the prompts.  For any non-urgent questions, you may also contact your provider using MyChart. We now offer e-Visits for anyone 34 and older to request care online for non-urgent symptoms. For details visit [email or web address].   Also download the MyChart app! Go to the app store, search "MyChart", open the app, select [facility], and log in with your MyChart username and password.  Due to Covid, a mask is required upon entering the hospital/clinic. If you do not have a mask, one will be given to you upon arrival. For doctor visits, patients may have 1 support person aged 12 or older with them. For treatment visits, patients cannot have anyone with them due to current Covid guidelines and our immunocompromised population.   Fam-Trastuzumab deruxtecan injection Qu es este medicamento? TRASTUZUMAB DERUXTECAN es un medicamento quimioteraputico y un anticuerpo monoclonal. Trata ciertos tipos de cncer. Algunos de los tipos de cncer tratados son cncer de mama y cncer gstrico. Este medicamento puede ser utilizado para otros usos; si tiene alguna pregunta consulte con su  proveedor de atencin mdica o con su farmacutico. MARCAS COMUNES: ENHERTU Qu le debo informar a mi profesional de la salud antes de tomar este medicamento? Necesitan saber si usted presenta alguno de los siguientes problemas o situaciones: enfermedad  cardiaca insuficiencia cardiaca infeccin (especialmente infecciones virales, como varicela, fuegos labiales o herpes) enfermedad heptica enfermedad pulmonar o respiratoria, como asma una reaccin alrgica o inusual al fam-trastuzumab deruxtecan, a otros medicamentos, alimentos, colorantes o conservantes si est embarazada o buscando quedar embarazada si est amamantando a un beb Cmo debo utilizar este medicamento? Este medicamento se administra mediante infusin en una vena. Lo administra un profesional de Technical sales engineer en un hospital o en un entorno clnico. Hable con su pediatra para informarse acerca del uso de este medicamento en nios. Puede requerir atencin especial. Sobredosis: Pngase en contacto inmediatamente con un centro toxicolgico o una sala de urgencia si usted cree que haya tomado demasiado medicamento. ATENCIN: [employer] es solo para usted. No comparta este medicamento con nadie. Qu sucede si me olvido de una dosis? Es importante no olvidar ninguna dosis. Informe a su mdico o a su profesional de la salud si no puede asistir a Photographer. Qu puede interactuar con este medicamento? No se han realizado estudios de interaccin. Puede ser que esta lista no menciona todas las posibles interacciones. Informe a su profesional de [employer]	[place] de [employer] productos a base de hierbas, medicamentos de [place] o suplementos nutritivos que est tomando. Si usted fuma, consume bebidas alcohlicas o si utiliza drogas ilegales, indqueselo tambin a su profesional de KB [employer]	[place]. Algunas sustancias pueden interactuar con su medicamento. A qu debo estar atento al usar [employer]a? Visite a su profesional de la salud para que revise regularmente su evolucin. Informe a su profesional de la salud si sus sntomas no comienzan a mejorar o si empeoran. Se supervisar su estado de salud atentamente mientras reciba este medicamento. No debe quedar embarazada mientras est usando este  medicamento o por 7 meses despus de dejar de usarlo. Las mujeres deben informar a su profesional de la salud si estn buscando quedar embarazadas o si creen que podran estar embarazadas. Los hombres no deben Social worker a Social research officer, government estn recibiendo Coca[employer]a y [place] 4 meses despus de dejar de usarlo. Existe la posibilidad de que ocurran efectos secundarios graves en un beb sin nacer. Para obtener ms informacin, hable con su profesional de KB Home	[place]. No debe amamantar a un beb mientras est usando este medicamento o durante 7 meses despus de la ltima dosis. Este medicamento ha causado recuentos de esperma reducidos en algunos hombres. Esto puede hacer ms difcil que un hombre embarace a Musician. Hable con su profesional de la salud si le preocupa su fertilidad. Este medicamento podra aumentar el riesgo de moretones o sangrado. Consulte a su profesional de la salud si observa sangrados inusuales. Proceda con cuidado al cepillar sus dientes, usar hilo dental o Risk manager palillos para los dientes, ya que podra contraer una infeccin o Therapist, art con mayor facilidad. Si se somete a algn tratamiento dental, informe a su dentista que est [employer]. Este medicamento puede resecarle los ojos [y provocar visin borrosa]. Si [place] lentes de contacto, puede sentir ciertas molestias. Las gotas lubricantes para los ojos pueden ser tiles. Si el problema no desaparece o es severo, consulte a su profesional de KB [employer]	[place]. Consulte a su profesional de la salud si tiene fiebre, escalofros, dolor de garganta o cualquier otro sntoma de resfro o gripe.  No se trate usted mismo. Este medicamento reduce la capacidad del cuerpo para combatir infecciones. Trate de no acercarse a personas que estn enfermas. Evite usar [employer] contienen aspirina, acetaminofeno, ibuprofeno, naproxeno o ketoprofeno, a menos que as lo indique su profesional de KB Home	[place]. Estos productos pueden ocultar la  fiebre. Qu efectos secundarios puedo tener al [employer] este medicamento? Efectos secundarios que debe informar a su mdico o a Barrister's clerk de la salud tan pronto como sea posible: Chief of Staff, tales como erupcin cutnea, comezn/picazn o urticaria, e hinchazn de la cara, los labios o la lengua problemas para respirar tos nuseas, vmito signos y sntomas de sangrado, tales como heces con sangre o de color negro y aspecto alquitranado; [place] de color rojo o marrn oscuro; escupir sangre o material marrn que tiene el aspecto de posos (residuos) de caf; Tree surgeon rojas en la piel; sangrado o moretones inusuales en los ojos, las encas o la nariz signos y sntomas de insuficiencia cardiaca tales como problemas para respirar; frecuencia cardiaca rpida, irregular; aumento repentino de peso; hinchazn de los tobillos, pies, manos; debilidad o cansancio inusual signos y sntomas de infeccin, tales como fiebre, escalofros, tos, Social research officer, government de garganta, Social research officer, government o dificultad para orinar signos y sntomas de baja cantidad de glbulos rojos o anemia, tales como debilidad o cansancio inusuales; sensacin de desmayo o aturdimiento; cadas; problemas para respirar Efectos secundarios que generalmente no requieren atencin mdica (debe informarlos a su mdico o a su profesional de la salud si persisten o si son molestos): estreimiento diarrea ojos secos cada del cabello prdida del apetito llagas en la boca erupcin cutnea Puede ser que esta lista no menciona todos los posibles efectos secundarios. Comunquese a su mdico por asesoramiento mdico [employer]. Usted puede informar los efectos secundarios a la FDA por telfono al 1-800-FDA-1088. Dnde debo guardar mi medicina? Este medicamento se administra en hospitales o clnicas, y no necesitar guardarlo en su domicilio. ATENCIN: Este folleto es un resumen. Puede ser que no cubra toda la posible informacin. Si usted tiene  preguntas acerca de esta medicina, consulte con su mdico, su farmacutico o su profesional de Technical sales engineer.  2022 Elsevier/Gold Standard (2020-04-19 00:00:00)

## 2022-01-08 NOTE — Progress Notes (Signed)
Sent in request for DOS 01/17/2022 and 01/19/2022 to [employer].  ?

## 2022-01-16 NOTE — Progress Notes (Signed)
?[facility]  ?[address] ?[place],  [place]  [address] ?[phone] ? ?Clinic Day:  01/17/2022 ? ?Referring physician: Marguerita Merles, MD ? ?HISTORY OF PRESENT ILLNESS:  ?The patient is a 71 y.o. 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  She also had a subcarinal lymph node for which scans have shown a positive response to therapy.  She comes in today prior to her 15th cycle of Enhertu.  The patient states that she tolerated her 14th cycle of treatment well.  She denies having any new symptoms or findings which concern her for possible disease progression.   ? ?VITALS:  ?Blood pressure 130/73, pulse 70, temperature 97.6 ?F (36.4 ?C), resp. rate 14, height _0  (1.549 m), weight 148 lb 9.6 oz (67.4 kg), SpO2 96 %.  ?Wt Readings from Last 3 Encounters:  ?01/17/22 148 lb 9.6 oz (67.4 kg)  ?12/28/21 147 lb (66.7 kg)  ?12/27/21 145 lb 14.4 oz (66.2 kg)  ?  ?Body mass index is 28.08 kg/m?. ? ?Performance status (ECOG): 1 - Symptomatic but completely ambulatory ? ?PHYSICAL EXAM:  ?Physical Exam ?Constitutional:   ?   General: She is not in acute distress. ?   Appearance: Normal appearance. She is normal weight.  ?HENT:  ?   Head: Normocephalic and atraumatic.  ?Eyes:  ?   General: No scleral icterus. ?   Extraocular Movements: Extraocular movements intact.  ?   Conjunctiva/sclera: Conjunctivae normal.  ?   Pupils: Pupils are equal, round, and reactive to light.  ?Cardiovascular:  ?   Rate and Rhythm: Normal rate and regular rhythm.  ?   Pulses: Normal pulses.  ?   Heart sounds: Normal heart sounds. No murmur heard. ?  No friction rub. No gallop.  ?Pulmonary:  ?   Effort: Pulmonary effort is normal. No respiratory distress.  ?   Breath sounds: Normal breath sounds.  ?Abdominal:  ?   General: Bowel sounds are normal. There is no distension.  ?   Palpations: Abdomen is soft.  There is no hepatomegaly, splenomegaly or mass.  ?   Tenderness: There is no abdominal tenderness.  ?Musculoskeletal:     ?   General: Normal range of motion.  ?   Cervical back: Normal range of motion and neck supple.  ?   Right lower leg: No edema.  ?   Left lower leg: No edema.  ?Lymphadenopathy:  ?   Cervical: No cervical adenopathy.  ?Skin: ?   General: Skin is warm and dry.  ?Neurological:  ?   General: No focal deficit present.  ?   Mental Status: She is alert and oriented to person, place, and time. Mental status is at baseline.  ?Psychiatric:     ?   Mood and Affect: Mood normal.     ?   Behavior: Behavior normal.     ?   Thought Content: Thought content normal.     ?   Judgment: Judgment normal.  ? ?LABS: ? Latest Reference Range & Units 01/17/22 00:00  ?Sodium 137 - 147  143 (E)  ?Potassium 3.5 - 5.1 mEq/L 3.6 (E)  ?Chloride 99 - 108  112 ! (E)  ?CO2 13 - 22  23 ! (E)  ?Glucose  86 (E)  ?BUN 4 - 21  14 (E)  ?Creatinine 0.5 - 1.1  0.6 (E)  ?Calcium 8.7 - 10.7  9.4 (E)  ?Alkaline Phosphatase 25 - 125  122 (E)  ?Albumin 3.5 - 5.0  3.7 (E)  ?AST 13 - 35  56 ! (E)  ?ALT 7 - 35 U/L 42 ! (E)  ?Bilirubin, Total  0.5 (E)  ?WBC  2.3 (E)  ?RBC 3.87 - 5.11  3.73 ! (E)  ?Hemoglobin 12.0 - 16.0  11.3 ! (E)  ?HCT 36 - 46  35 ! (E)  ?Platelets 150 - 400 K/uL 167 (E)  ?NEUT#  1.10 (E)  ?!: Data is abnormal ?(E): External lab result ? ?ASSESSMENT & PLAN:  ?Assessment/Plan:  A 71 y.o. 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  She also has had subcarinal nodal disease.  She will proceed with her 15th cycle of Enhertu this week.  Clinically, she appears to be doing well.  I will see her back in 3 weeks before she heads into her 16th cycle of treatment.  The patient understands all the plans discussed today and is in agreement with them.   ? ?Augusta Hilbert Macarthur Critchley, MD ? ? ? ?  ? ?

## 2022-01-17 ENCOUNTER — Inpatient Hospital Stay (HOSPITAL_BASED_OUTPATIENT_CLINIC_OR_DEPARTMENT_OTHER): Payer: Self-pay | Admitting: Oncology

## 2022-01-17 ENCOUNTER — Other Ambulatory Visit: Payer: Self-pay

## 2022-01-17 ENCOUNTER — Inpatient Hospital Stay: Payer: Self-pay

## 2022-01-17 VITALS — BP 130/73 | HR 70 | Temp 97.6°F | Resp 14 | Ht 61.0 in | Wt 148.6 lb

## 2022-01-17 DIAGNOSIS — C50412 Malignant neoplasm of upper-outer quadrant of left female breast: Secondary | ICD-10-CM

## 2022-01-17 DIAGNOSIS — Z171 Estrogen receptor negative status [ER-]: Secondary | ICD-10-CM

## 2022-01-17 LAB — BASIC METABOLIC PANEL
BUN: 14 (ref 4–21)
CO2: 23 — AB (ref 13–22)
Chloride: 112 — AB (ref 99–108)
Creatinine: 0.6 (ref 0.5–1.1)
Glucose: 86
Potassium: 3.6 mEq/L (ref 3.5–5.1)
Sodium: 143 (ref 137–147)

## 2022-01-17 LAB — CBC: RBC: 3.73 — AB (ref 3.87–5.11)

## 2022-01-17 LAB — CBC AND DIFFERENTIAL
HCT: 35 — AB (ref 36–46)
Hemoglobin: 11.3 — AB (ref 12.0–16.0)
Neutrophils Absolute: 1.1
Platelets: 167 10*3/uL (ref 150–400)
WBC: 2.3

## 2022-01-17 LAB — COMPREHENSIVE METABOLIC PANEL
Albumin: 3.7 (ref 3.5–5.0)
Calcium: 9.4 (ref 8.7–10.7)

## 2022-01-17 LAB — HEPATIC FUNCTION PANEL
ALT: 42 U/L — AB (ref 7–35)
AST: 56 — AB (ref 13–35)
Alkaline Phosphatase: 122 (ref 25–125)
Bilirubin, Total: 0.5

## 2022-01-17 NOTE — Progress Notes (Signed)
T

## 2022-01-18 ENCOUNTER — Ambulatory Visit: Payer: Self-pay

## 2022-01-18 ENCOUNTER — Encounter: Payer: Self-pay | Admitting: Oncology

## 2022-01-18 ENCOUNTER — Other Ambulatory Visit: Payer: Self-pay

## 2022-01-18 NOTE — Progress Notes (Signed)
Ok to proceed with chemo despite ANC=1100 per Dr. Bobby Rumpf.  ?

## 2022-01-19 ENCOUNTER — Inpatient Hospital Stay: Payer: Self-pay

## 2022-01-19 VITALS — BP 129/74 | HR 77 | Temp 97.9°F | Resp 18 | Ht 61.0 in | Wt 147.0 lb

## 2022-01-19 DIAGNOSIS — Z171 Estrogen receptor negative status [ER-]: Secondary | ICD-10-CM

## 2022-01-19 MED ORDER — SODIUM CHLORIDE 0.9 % IV SOLN
10.0000 mg | Freq: Once | INTRAVENOUS | Status: AC
  Administered 2022-01-19: 10 mg via INTRAVENOUS
  Filled 2022-01-19: qty 10

## 2022-01-19 MED ORDER — DEXTROSE 5 % IV SOLN: Freq: Once | INTRAVENOUS | Status: AC

## 2022-01-19 MED ORDER — HEPARIN SOD (PORK) LOCK FLUSH 100 UNIT/ML IV SOLN: 250.0000 [IU] | Freq: Once | INTRAVENOUS | Status: DC | PRN

## 2022-01-19 MED ORDER — HEPARIN SOD (PORK) LOCK FLUSH 100 UNIT/ML IV SOLN
500.0000 [IU] | Freq: Once | INTRAVENOUS | Status: AC | PRN
  Administered 2022-01-19: 500 [IU]

## 2022-01-19 MED ORDER — FAM-TRASTUZUMAB DERUXTECAN-NXKI CHEMO 100 MG IV SOLR
3.4000 mg/kg | Freq: Once | INTRAVENOUS | Status: AC
  Administered 2022-01-19: 240 mg via INTRAVENOUS
  Filled 2022-01-19: qty 12

## 2022-01-19 MED ORDER — DIPHENHYDRAMINE HCL 25 MG PO CAPS
50.0000 mg | ORAL_CAPSULE | Freq: Once | ORAL | Status: AC
  Administered 2022-01-19: 50 mg via ORAL
  Filled 2022-01-19: qty 2

## 2022-01-19 MED ORDER — PALONOSETRON HCL INJECTION 0.25 MG/5ML
0.2500 mg | Freq: Once | INTRAVENOUS | Status: AC
  Administered 2022-01-19: 0.25 mg via INTRAVENOUS
  Filled 2022-01-19: qty 5

## 2022-01-19 MED ORDER — ACETAMINOPHEN 325 MG PO TABS
650.0000 mg | ORAL_TABLET | Freq: Once | ORAL | Status: AC
  Administered 2022-01-19: 650 mg via ORAL
  Filled 2022-01-19: qty 2

## 2022-01-19 NOTE — Patient Instructions (Signed)
Fam-Trastuzumab deruxtecan injection ?What is this medication? ?TRASTUZUMAB DERUXTECAN (tras TOOZ eu mab DER ux TEE kan) is a chemotherapy medicine and a monoclonal antibody. It treats certain types of cancer. Some of the cancers treated are breast cancer and gastric cancer. ?This medicine may be used for other purposes; ask your health care provider or pharmacist if you have questions. ?COMMON BRAND NAME(S): ENHERTU ?What should I tell my care team before I take this medication? ?They need to know if you have any of these conditions: ?heart disease ?heart failure ?infection (especially a virus infection such as chickenpox, cold sores, or herpes) ?liver disease ?lung or breathing disease, like asthma ?an unusual or allergic reaction to fam-trastuzumab deruxtecan, other medications, foods, dyes, or preservatives ?pregnant or trying to get pregnant ?breast-feeding ?How should I use this medication? ?This medicine is for infusion into a vein. It is given by a health care professional in a hospital or clinic setting. ?Talk to your pediatrician regarding the use of this medicine in children. Special care may be needed. ?Overdosage: If you think you have taken too much of this medicine contact a poison control center or emergency room at once. ?NOTE: This medicine is only for you. Do not share this medicine with others. ?What if I miss a dose? ?It is important not to miss your dose. Call your doctor or health care professional if you are unable to keep an appointment. ?What may interact with this medication? ?Interaction studies have not been performed. ?This list may not describe all possible interactions. Give your health care provider a list of all the medicines, herbs, non-prescription drugs, or dietary supplements you use. Also tell them if you smoke, drink alcohol, or use illegal drugs. Some items may interact with your medicine. ?What should I watch for while using this medication? ?Visit your healthcare  professional for regular checks on your progress. Tell your healthcare professional if your symptoms do not start to get better or if they get worse. ?Your condition will be monitored carefully while you are receiving this medicine. ?Do not become pregnant while taking this medicine or for 7 months after stopping it. Women should inform their healthcare professional if they wish to become pregnant or think they might be pregnant. Men should not father a child while taking this medicine and for 4 months after stopping it. There is potential for serious side effects to an unborn child. Talk to your healthcare professional for more information. ?Do not breast-feed an infant while taking this medicine or for 7 months after the last dose. ?This medicine has caused decreased sperm counts in some men. This may make it more difficult to father a child. Talk to your healthcare professional if you are concerned about your fertility. ?This medicine may increase your risk to bruise or bleed. Call your health care professional if you notice any unusual bleeding. ?Be careful brushing or flossing your teeth or using a toothpick because you may get an infection or bleed more easily. If you have any dental work done, tell your dentist you are receiving this medicine. ?This medicine may cause dry eyes [and blurred vision]. If you wear contact lenses, you may feel some discomfort. Lubricating eye drops may help. See your healthcare professional if the problem does not go away or is severe. ?Call your healthcare professional for advice if you get a fever, chills, or sore throat, or other symptoms of a cold or flu. Do not treat yourself. This medicine decreases your body's ability to fight infections.  Try to avoid being around people who are sick. ?Avoid taking medicines that contain aspirin, acetaminophen, ibuprofen, naproxen, or ketoprofen unless instructed by your healthcare professional. These medicines may hide a fever. ?What side  effects may I notice from receiving this medication? ?Side effects that you should report to your doctor or health care professional as soon as possible: ?allergic reactions like skin rash, itching or hives, swelling of the face, lips, or tongue ?breathing problems ?cough ?nausea, vomiting ?signs and symptoms of bleeding such as bloody or black, tarry stools; red or dark-brown urine; spitting up blood or brown material that looks like coffee grounds; red spots on the skin; unusual bruising or bleeding from the eye, gums, or nose ?signs and symptoms of heart failure like breathing problems, fast, irregular heartbeat, sudden weight gain; swelling of the ankles, feet, hands; unusually weak or tired ?signs and symptoms of infection like fever; chills; cough; sore throat; pain or trouble passing urine ?signs and symptoms of low red blood cells or anemia such as unusually weak or tired; feeling faint or lightheaded; falls; breathing problems ?Side effects that usually do not require medical attention (report these to your doctor or health care professional if they continue or are bothersome): ?constipation ?diarrhea ?dry eyes ?hair loss ?loss of appetite ?mouth sores ?rash ?This list may not describe all possible side effects. Call your doctor for medical advice about side effects. You may report side effects to FDA at 1-800-FDA-1088. ?Where should I keep my medication? ?This drug is given in a hospital or clinic and will not be stored at home. ?NOTE: This sheet is a summary. It may not cover all possible information. If you have questions about this medicine, talk to your doctor, pharmacist, or health care provider. ?? 2022 Elsevier/Gold Standard (2021-07-04 00:00:00) ? ?

## 2022-01-29 NOTE — Progress Notes (Signed)
Sent in request for DOS 04/12 and 04/14 to [employer].  ?

## 2022-01-31 ENCOUNTER — Encounter: Payer: Self-pay | Admitting: Oncology

## 2022-02-06 NOTE — Progress Notes (Signed)
?[facility]  ?[address] ?[place],  [place]  [address] ?[phone] ? ?Clinic Day:  02/07/2022 ? ?Referring physician: Marguerita Merles, MD ? ?HISTORY OF PRESENT ILLNESS:  ?The patient is a 71 y.o. 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  She also had a subcarinal lymph node for which scans have shown a positive response to therapy.  She comes in today prior to her 16th cycle of Enhertu.  The patient states that she tolerated her 15th cycle of treatment very well.  She denies having any new symptoms or findings which concern her for possible disease progression.   ? ?VITALS:  ?Blood pressure 137/79, pulse 67, temperature 97.9 ?F (36.6 ?C), temperature source Oral, resp. rate 16, height _0  (1.549 m), weight 148 lb (67.1 kg), SpO2 99 %.  ?Wt Readings from Last 3 Encounters:  ?02/07/22 148 lb (67.1 kg)  ?01/19/22 147 lb (66.7 kg)  ?01/17/22 148 lb 9.6 oz (67.4 kg)  ?  ?Body mass index is 27.96 kg/m?. ? ?Performance status (ECOG): 1 - Symptomatic but completely ambulatory ? ?PHYSICAL EXAM:  ?Physical Exam ?Constitutional:   ?   General: She is not in acute distress. ?   Appearance: Normal appearance. She is normal weight.  ?HENT:  ?   Head: Normocephalic and atraumatic.  ?Eyes:  ?   General: No scleral icterus. ?   Extraocular Movements: Extraocular movements intact.  ?   Conjunctiva/sclera: Conjunctivae normal.  ?   Pupils: Pupils are equal, round, and reactive to light.  ?Cardiovascular:  ?   Rate and Rhythm: Normal rate and regular rhythm.  ?   Pulses: Normal pulses.  ?   Heart sounds: Normal heart sounds. No murmur heard. ?  No friction rub. No gallop.  ?Pulmonary:  ?   Effort: Pulmonary effort is normal. No respiratory distress.  ?   Breath sounds: Normal breath sounds.  ?Abdominal:  ?   General: Bowel sounds are normal. There is no distension.  ?   Palpations:  Abdomen is soft. There is no hepatomegaly, splenomegaly or mass.  ?   Tenderness: There is no abdominal tenderness.  ?Musculoskeletal:     ?   General: Normal range of motion.  ?   Cervical back: Normal range of motion and neck supple.  ?   Right lower leg: No edema.  ?   Left lower leg: No edema.  ?Lymphadenopathy:  ?   Cervical: No cervical adenopathy.  ?Skin: ?   General: Skin is warm and dry.  ?Neurological:  ?   General: No focal deficit present.  ?   Mental Status: She is alert and oriented to person, place, and time. Mental status is at baseline.  ?Psychiatric:     ?   Mood and Affect: Mood normal.     ?   Behavior: Behavior normal.     ?   Thought Content: Thought content normal.     ?   Judgment: Judgment normal.  ? ?LABS: ? Latest Reference Range & Units 02/07/22 00:00  ?Sodium 137 - 147  142 (E)  ?Potassium 3.5 - 5.1 mEq/L 3.5 (E)  ?Chloride 99 - 108  109 ! (E)  ?CO2 13 - 22  23 ! (E)  ?Glucose  89 (E)  ?BUN 4 - 21  11 (E)  ?Creatinine 0.5 - 1.1  0.5 (E)  ?Calcium 8.7 - 10.7  9.2 (E)  ?Alkaline Phosphatase 25 - 125  124 (E)  ?Albumin 3.5 - 5.0  3.8 (E)  ?AST 13 - 35  55 ! (E)  ?ALT 7 - 35 U/L 41 ! (E)  ?Bilirubin, Total  0.4 (E)  ?WBC  2.2 (E)  ?RBC 3.87 - 5.11  3.67 ! (E)  ?Hemoglobin 12.0 - 16.0  11.1 ! (E)  ?HCT 36 - 46  35 ! (E)  ?Platelets 150 - 400 K/uL 163 (E)  ?NEUT#  1.10 (E)  ?!: Data is abnormal ?(E): External lab result ? ?ASSESSMENT & PLAN:  ?Assessment/Plan:  A 71 y.o. 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  She also has had subcarinal nodal disease.  She will proceed with her 16th cycle of Enhertu this week.  As her absolute neutrophil count is low, she will also receive Zarxio midcycle to prevent severe neutropenia from causing any particular problems.  Overall, she appears to be doing well.  I will see her back in 3 weeks before she heads into her 17th cycle of treatment.  Scans will be done before her  next visit to ascertain her new disease baseline after 16 cycles of treatment..  The patient understands all the plans discussed today and is in agreement with them.   ? ?Tyan Dy Macarthur Critchley, MD ? ? ? ?  ? ?

## 2022-02-07 ENCOUNTER — Inpatient Hospital Stay: Payer: Self-pay | Attending: Oncology | Admitting: Oncology

## 2022-02-07 ENCOUNTER — Other Ambulatory Visit: Payer: Self-pay

## 2022-02-07 ENCOUNTER — Inpatient Hospital Stay: Payer: Self-pay

## 2022-02-07 ENCOUNTER — Other Ambulatory Visit: Payer: Self-pay | Admitting: Oncology

## 2022-02-07 VITALS — BP 137/79 | HR 67 | Temp 97.9°F | Resp 16 | Ht 61.0 in | Wt 148.0 lb

## 2022-02-07 DIAGNOSIS — D702 Other drug-induced agranulocytosis: Secondary | ICD-10-CM

## 2022-02-07 DIAGNOSIS — Z5112 Encounter for antineoplastic immunotherapy: Secondary | ICD-10-CM | POA: Insufficient documentation

## 2022-02-07 DIAGNOSIS — C50912 Malignant neoplasm of unspecified site of left female breast: Secondary | ICD-10-CM

## 2022-02-07 DIAGNOSIS — C7931 Secondary malignant neoplasm of brain: Secondary | ICD-10-CM | POA: Insufficient documentation

## 2022-02-07 DIAGNOSIS — C771 Secondary and unspecified malignant neoplasm of intrathoracic lymph nodes: Secondary | ICD-10-CM | POA: Insufficient documentation

## 2022-02-07 DIAGNOSIS — Z170 Estrogen receptor positive status [ER+]: Secondary | ICD-10-CM | POA: Insufficient documentation

## 2022-02-07 DIAGNOSIS — Z79899 Other long term (current) drug therapy: Secondary | ICD-10-CM | POA: Insufficient documentation

## 2022-02-07 LAB — CBC AND DIFFERENTIAL
HCT: 35 — AB (ref 36–46)
Hemoglobin: 11.1 — AB (ref 12.0–16.0)
Neutrophils Absolute: 1.1
Platelets: 163 10*3/uL (ref 150–400)
WBC: 2.2

## 2022-02-07 LAB — CBC: RBC: 3.67 — AB (ref 3.87–5.11)

## 2022-02-07 LAB — HEPATIC FUNCTION PANEL
ALT: 41 U/L — AB (ref 7–35)
AST: 55 — AB (ref 13–35)
Alkaline Phosphatase: 124 (ref 25–125)
Bilirubin, Total: 0.4

## 2022-02-07 LAB — BASIC METABOLIC PANEL
BUN: 11 (ref 4–21)
CO2: 23 — AB (ref 13–22)
Chloride: 109 — AB (ref 99–108)
Creatinine: 0.5 (ref 0.5–1.1)
Glucose: 89
Potassium: 3.5 mEq/L (ref 3.5–5.1)
Sodium: 142 (ref 137–147)

## 2022-02-07 LAB — COMPREHENSIVE METABOLIC PANEL
Albumin: 3.8 (ref 3.5–5.0)
Calcium: 9.2 (ref 8.7–10.7)

## 2022-02-08 ENCOUNTER — Encounter: Payer: Self-pay | Admitting: Oncology

## 2022-02-08 DIAGNOSIS — D702 Other drug-induced agranulocytosis: Secondary | ICD-10-CM | POA: Insufficient documentation

## 2022-02-09 ENCOUNTER — Inpatient Hospital Stay: Payer: Self-pay

## 2022-02-09 VITALS — BP 121/62 | HR 72 | Resp 16 | Wt 147.8 lb

## 2022-02-09 DIAGNOSIS — Z171 Estrogen receptor negative status [ER-]: Secondary | ICD-10-CM

## 2022-02-09 MED ORDER — SODIUM CHLORIDE 0.9% FLUSH
10.0000 mL | INTRAVENOUS | Status: DC | PRN
  Administered 2022-02-09: 10 mL

## 2022-02-09 MED ORDER — HEPARIN SOD (PORK) LOCK FLUSH 100 UNIT/ML IV SOLN
500.0000 [IU] | Freq: Once | INTRAVENOUS | Status: AC | PRN
  Administered 2022-02-09: 500 [IU]

## 2022-02-09 MED ORDER — SODIUM CHLORIDE 0.9 % IV SOLN
10.0000 mg | Freq: Once | INTRAVENOUS | Status: AC
  Administered 2022-02-09: 10 mg via INTRAVENOUS
  Filled 2022-02-09: qty 1

## 2022-02-09 MED ORDER — ACETAMINOPHEN 325 MG PO TABS
650.0000 mg | ORAL_TABLET | Freq: Once | ORAL | Status: AC
  Administered 2022-02-09: 650 mg via ORAL
  Filled 2022-02-09: qty 2

## 2022-02-09 MED ORDER — PALONOSETRON HCL INJECTION 0.25 MG/5ML
0.2500 mg | Freq: Once | INTRAVENOUS | Status: AC
  Administered 2022-02-09: 0.25 mg via INTRAVENOUS
  Filled 2022-02-09: qty 5

## 2022-02-09 MED ORDER — FAM-TRASTUZUMAB DERUXTECAN-NXKI CHEMO 100 MG IV SOLR
3.4000 mg/kg | Freq: Once | INTRAVENOUS | Status: AC
  Administered 2022-02-09: 240 mg via INTRAVENOUS
  Filled 2022-02-09: qty 12

## 2022-02-09 MED ORDER — DEXTROSE 5 % IV SOLN: Freq: Once | INTRAVENOUS | Status: AC

## 2022-02-09 MED ORDER — DIPHENHYDRAMINE HCL 25 MG PO CAPS
50.0000 mg | ORAL_CAPSULE | Freq: Once | ORAL | Status: AC
  Administered 2022-02-09: 50 mg via ORAL
  Filled 2022-02-09: qty 2

## 2022-02-09 NOTE — Progress Notes (Signed)
1130: Anc 1.1 orders to treat today and schedule Zarxio injections when available. [name] translated for her today explained we would be calling once available and will arrange transportation if necessary.Patient agrees with the plan. ?

## 2022-02-09 NOTE — Patient Instructions (Signed)
[facility]  Discharge Instructions: ?Thank you for choosing [facility] to provide your oncology and hematology care.  ?If you have a lab appointment with the [facility], please go directly to the [facility] and check in at the registration area. ?  ?Wear comfortable clothing and clothing appropriate for easy access to any Portacath or PICC line.  ? ?We strive to give you quality time with your provider. You may need to reschedule your appointment if you arrive late (15 or more minutes).  Arriving late affects you and other patients whose appointments are after yours.  Also, if you miss three or more appointments without notifying the office, you may be dismissed from the clinic at the provider?s discretion.    ?  ?For prescription refill requests, have your pharmacy contact our office and allow 72 hours for refills to be completed.   ? ?Today you received the following chemotherapy and/or immunotherapy agents Fam Trastuzumab  ?  ?To help prevent nausea and vomiting after your treatment, we encourage you to take your nausea medication as directed. ? ?BELOW ARE SYMPTOMS THAT SHOULD BE REPORTED IMMEDIATELY: ?*FEVER GREATER THAN 100.4 F (38 ?C) OR HIGHER ?*CHILLS OR SWEATING ?*NAUSEA AND VOMITING THAT IS NOT CONTROLLED WITH YOUR NAUSEA MEDICATION ?*UNUSUAL SHORTNESS OF BREATH ?*UNUSUAL BRUISING OR BLEEDING ?*URINARY PROBLEMS (pain or burning when urinating, or frequent urination) ?*BOWEL PROBLEMS (unusual diarrhea, constipation, pain near the anus) ?TENDERNESS IN MOUTH AND THROAT WITH OR WITHOUT PRESENCE OF ULCERS (sore throat, sores in mouth, or a toothache) ?UNUSUAL RASH, SWELLING OR PAIN  ?UNUSUAL VAGINAL DISCHARGE OR ITCHING  ? ?Items with * indicate a potential emergency and should be followed up as soon as possible or go to the Emergency Department if any problems should occur. ? ?Please show the CHEMOTHERAPY ALERT CARD or IMMUNOTHERAPY ALERT CARD at check-in to the  Emergency Department and triage nurse. ? ?Should you have questions after your visit or need to cancel or reschedule your appointment, please contact [facility]  Dept: [phone]  and follow the prompts.  Office hours are 8:00 a.m. to 4:30 p.m. Monday - Friday. Please note that voicemails left after 4:00 p.m. may not be returned until the following business day.  We are closed weekends and major holidays. You have access to a nurse at all times for urgent questions. Please call the main number to the clinic Dept: [phone] and follow the prompts. ? ?For any non-urgent questions, you may also contact your provider using MyChart. We now offer e-Visits for anyone 20 and older to request care online for non-urgent symptoms. For details visit [email or web address]. ?  ?Also download the MyChart app! Go to the app store, search "MyChart", open the app, select [facility], and log in with your MyChart username and password. ? ?Due to Covid, a mask is required upon entering the hospital/clinic. If you do not have a mask, one will be given to you upon arrival. For doctor visits, patients may have 1 support person aged 17 or older with them. For treatment visits, patients cannot have anyone with them due to current Covid guidelines and our immunocompromised population.  ? ? ?

## 2022-02-21 NOTE — Progress Notes (Signed)
Sent in request for DOS 05/02, 05/03, and 05/05 to [employer].  ?

## 2022-02-23 NOTE — Progress Notes (Unsigned)
Patient is receiving Assistance Medication - Supplied Externally. ?Medication: Zarxio ?Manufacture: [employer] ?Approval Dates: Approved from 02/20/2022 until 02/21/2023. ?ID: [id number] ?Reason: No Insurance ?First DOS: 02/27/2022 ?  ?

## 2022-02-26 ENCOUNTER — Other Ambulatory Visit: Payer: Self-pay | Admitting: Pharmacist

## 2022-02-27 ENCOUNTER — Encounter: Payer: Self-pay | Admitting: Oncology

## 2022-02-27 ENCOUNTER — Inpatient Hospital Stay: Payer: Self-pay | Attending: Oncology

## 2022-02-27 VITALS — BP 130/68 | HR 68 | Temp 97.9°F | Resp 18 | Ht 61.0 in | Wt 146.5 lb

## 2022-02-27 DIAGNOSIS — D702 Other drug-induced agranulocytosis: Secondary | ICD-10-CM

## 2022-02-27 DIAGNOSIS — C50412 Malignant neoplasm of upper-outer quadrant of left female breast: Secondary | ICD-10-CM | POA: Insufficient documentation

## 2022-02-27 DIAGNOSIS — Z171 Estrogen receptor negative status [ER-]: Secondary | ICD-10-CM | POA: Insufficient documentation

## 2022-02-27 DIAGNOSIS — Z79899 Other long term (current) drug therapy: Secondary | ICD-10-CM | POA: Insufficient documentation

## 2022-02-27 DIAGNOSIS — Z5112 Encounter for antineoplastic immunotherapy: Secondary | ICD-10-CM | POA: Insufficient documentation

## 2022-02-27 DIAGNOSIS — C7931 Secondary malignant neoplasm of brain: Secondary | ICD-10-CM | POA: Insufficient documentation

## 2022-02-27 MED ORDER — FILGRASTIM-SNDZ 480 MCG/0.8ML IJ SOSY
480.0000 ug | PREFILLED_SYRINGE | Freq: Once | INTRAMUSCULAR | Status: AC
  Administered 2022-02-27: 480 ug via SUBCUTANEOUS

## 2022-02-27 NOTE — Progress Notes (Signed)
?[facility]  ?[address] ?[place],  [place]  [address] ?[phone] ? ?Clinic Day:  02/28/2022 ? ?Referring physician: Marguerita Merles, MD ? ?HISTORY OF PRESENT ILLNESS:  ?The patient is a 71 y.o. 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  She also had a subcarinal lymph node for which scans have shown a positive response to therapy.  She comes in today prior to go over her CT scans after receiving 16 cycles of Enhertu.  The patient states that she tolerated her 16th cycle of treatment very well.  She denies having any new symptoms or findings which concern her for possible disease progression.   ? ?VITALS:  ?Blood pressure (!) 114/58, pulse 85, temperature 98.2 ?F (36.8 ?C), resp. rate 14, height $RemoveBe'5\' 1"'JSjPrmlhT$  (1.549 m), weight 147 lb 11.2 oz (67 kg), SpO2 96 %.  ?Wt Readings from Last 3 Encounters:  ?02/28/22 147 lb 11.2 oz (67 kg)  ?02/27/22 146 lb 8 oz (66.5 kg)  ?02/09/22 147 lb 12 oz (67 kg)  ?  ?Body mass index is 27.91 kg/m?. ? ?Performance status (ECOG): 1 - Symptomatic but completely ambulatory ? ?PHYSICAL EXAM:  ?Physical Exam ?Constitutional:   ?   General: She is not in acute distress. ?   Appearance: Normal appearance. She is normal weight.  ?HENT:  ?   Head: Normocephalic and atraumatic.  ?Eyes:  ?   General: No scleral icterus. ?   Extraocular Movements: Extraocular movements intact.  ?   Conjunctiva/sclera: Conjunctivae normal.  ?   Pupils: Pupils are equal, round, and reactive to light.  ?Cardiovascular:  ?   Rate and Rhythm: Normal rate and regular rhythm.  ?   Pulses: Normal pulses.  ?   Heart sounds: Normal heart sounds. No murmur heard. ?  No friction rub. No gallop.  ?Pulmonary:  ?   Effort: Pulmonary effort is normal. No respiratory distress.  ?   Breath sounds: Normal breath sounds.  ?Abdominal:  ?   General: Bowel sounds are normal. There is no  distension.  ?   Palpations: Abdomen is soft. There is no hepatomegaly, splenomegaly or mass.  ?   Tenderness: There is no abdominal tenderness.  ?Musculoskeletal:     ?   General: Normal range of motion.  ?   Cervical back: Normal range of motion and neck supple.  ?   Right lower leg: No edema.  ?   Left lower leg: No edema.  ?Lymphadenopathy:  ?   Cervical: No cervical adenopathy.  ?Skin: ?   General: Skin is warm and dry.  ?Neurological:  ?   General: No focal deficit present.  ?   Mental Status: She is alert and oriented to person, place, and time. Mental status is at baseline.  ?Psychiatric:     ?   Mood and Affect: Mood normal.     ?   Behavior: Behavior normal.     ?   Thought Content: Thought content normal.     ?   Judgment: Judgment normal.  ? ?SCANS:  Her chest CT revealed the following: ? ? FINDINGS:  ?Cardiovascular: Accessed right chest wall Port-A-Cath with tip in  ?the right atrium. Normal caliber thoracic aorta. No central  ?pulmonary embolus on this nondedicated study. Normal size heart. No  ?significant pericardial effusion/thickening.  ?Mediastinum/Nodes: No discrete thyroid nodule. No pathologically  ?enlarged mediastinal, hilar or axillary  lymph nodes. Prior left  ?axillary lymph node dissection. Large hiatal hernia.  ?Lungs/Pleura: No suspicious pulmonary nodules or masses. No pleural  ?effusion. No pneumothorax. Hypoventilatory change in the dependent  ?lungs.  ?Upper Abdomen: Stable hypodense hepatic cysts measuring up to 4.7 cm  ?and hypodense hepatic lesions which are technically too small to  ?accurately characterize but statistically likely to reflect benign  ?hepatic cysts. No acute abnormality. Aortic atherosclerosis. ?Musculoskeletal: Thoracolumbar degenerative change. No aggressive  ?lytic or blastic lesion of bone.  ? ?IMPRESSION:  ?1. No evidence of metastatic disease within the chest.  ?2. Large hiatal hernia.  ?3. Aortic Atherosclerosis (ICD10-I70.0).  ? ?ASSESSMENT & PLAN:   ?Assessment/Plan:  A 71 y.o. 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  She also has had subcarinal nodal disease.  In clinic today, I went over her chest CT images with her, for which she could see breast, he remains disease-free.  Based upon this, she will proceed with her 17th cycle of Enhertu this week.  Overall, she appears to be doing well.  I will see her back in 3 weeks before she heads into her 18th cycle of treatment.  The patient understands all the plans discussed today and is in agreement with them.   ? ?Jaqualyn Juday Macarthur Critchley, MD ? ? ? ?  ? ?

## 2022-02-27 NOTE — Patient Instructions (Signed)
Filgrastim, G-CSF injection ?What is this medication? ?FILGRASTIM, G-CSF (fil GRA stim) is a granulocyte colony-stimulating factor that stimulates the growth of neutrophils, a type of white blood cell (WBC) important in the body's fight against infection. It is used to reduce the incidence of fever and infection in patients with certain types of cancer who are receiving chemotherapy that affects the bone marrow, to stimulate blood cell production for removal of WBCs from the body prior to a bone marrow transplantation, to reduce the incidence of fever and infection in patients who have severe chronic neutropenia, and to improve survival outcomes following high-dose radiation exposure that is toxic to the bone marrow. ?This medicine may be used for other purposes; ask your health care provider or pharmacist if you have questions. ?COMMON BRAND NAME(S): Neupogen, Nivestym, Releuko, Zarxio ?What should I tell my care team before I take this medication? ?They need to know if you have any of these conditions: ?kidney disease ?latex allergy ?ongoing radiation therapy ?sickle cell disease ?an unusual or allergic reaction to filgrastim, pegfilgrastim, other medicines, foods, dyes, or preservatives ?pregnant or trying to get pregnant ?breast-feeding ?How should I use this medication? ?This medicine is for injection under the skin or infusion into a vein. As an infusion into a vein, it is usually given by a health care professional in a hospital or clinic setting. If you get this medicine at home, you will be taught how to prepare and give this medicine. Refer to the Instructions for Use that come with your medication packaging. Use exactly as directed. Take your medicine at regular intervals. Do not take your medicine more often than directed. ?It is important that you put your used needles and syringes in a special sharps container. Do not put them in a trash can. If you do not have a sharps container, call your pharmacist  or healthcare provider to get one. ?Talk to your pediatrician regarding the use of this medicine in children. While this drug may be prescribed for children as young as 7 months for selected conditions, precautions do apply. ?Overdosage: If you think you have taken too much of this medicine contact a poison control center or emergency room at once. ?NOTE: This medicine is only for you. Do not share this medicine with others. ?What if I miss a dose? ?It is important not to miss your dose. Call your doctor or health care professional if you miss a dose. ?What may interact with this medication? ?This medicine may interact with the following medications: ?medicines that may cause a release of neutrophils, such as lithium ?This list may not describe all possible interactions. Give your health care provider a list of all the medicines, herbs, non-prescription drugs, or dietary supplements you use. Also tell them if you smoke, drink alcohol, or use illegal drugs. Some items may interact with your medicine. ?What should I watch for while using this medication? ?Your condition will be monitored carefully while you are receiving this medicine. ?You may need blood work done while you are taking this medicine. ?Talk to your health care provider about your risk of cancer. You may be more at risk for certain types of cancer if you take this medicine. ?What side effects may I notice from receiving this medication? ?Side effects that you should report to your doctor or health care professional as soon as possible: ?allergic reactions like skin rash, itching or hives, swelling of the face, lips, or tongue ?back pain ?dizziness or feeling faint ?fever ?pain, redness, or  irritation at site where injected ?pinpoint red spots on the skin ?shortness of breath or breathing problems ?signs and symptoms of kidney injury like trouble passing urine, change in the amount of urine, or red or dark-brown urine ?stomach or side pain, or pain at  the shoulder ?swelling ?tiredness ?unusual bleeding or bruising ?Side effects that usually do not require medical attention (report to your doctor or health care professional if they continue or are bothersome): ?bone pain ?cough ?diarrhea ?hair loss ?headache ?muscle pain ?This list may not describe all possible side effects. Call your doctor for medical advice about side effects. You may report side effects to FDA at 1-800-FDA-1088. ?Where should I keep my medication? ?Keep out of the reach of children. ?Store in a refrigerator between 2 and 8 degrees C (36 and 46 degrees F). Do not freeze. Keep in carton to protect from light. Throw away this medicine if vials or syringes are left out of the refrigerator for more than 24 hours. Throw away any unused medicine after the expiration date. ?NOTE: This sheet is a summary. It may not cover all possible information. If you have questions about this medicine, talk to your doctor, pharmacist, or health care provider. ?? 2023 Elsevier/Gold Standard (2021-09-15 00:00:00) ? ?

## 2022-02-28 ENCOUNTER — Other Ambulatory Visit: Payer: Self-pay | Admitting: Oncology

## 2022-02-28 ENCOUNTER — Inpatient Hospital Stay (HOSPITAL_BASED_OUTPATIENT_CLINIC_OR_DEPARTMENT_OTHER): Payer: Self-pay | Admitting: Oncology

## 2022-02-28 ENCOUNTER — Encounter: Payer: Self-pay | Admitting: Oncology

## 2022-02-28 VITALS — BP 114/58 | HR 85 | Temp 98.2°F | Resp 14 | Ht 61.0 in | Wt 147.7 lb

## 2022-02-28 DIAGNOSIS — C50412 Malignant neoplasm of upper-outer quadrant of left female breast: Secondary | ICD-10-CM

## 2022-02-28 DIAGNOSIS — Z171 Estrogen receptor negative status [ER-]: Secondary | ICD-10-CM

## 2022-03-01 ENCOUNTER — Encounter: Payer: Self-pay | Admitting: Oncology

## 2022-03-01 NOTE — Progress Notes (Signed)
Ok to proceed with chemo despite ANC=1460 per Dr. Bobby Rumpf.  Pt received zarxio injection, which should help to improve this count. ?

## 2022-03-02 ENCOUNTER — Inpatient Hospital Stay: Payer: Self-pay

## 2022-03-02 VITALS — BP 104/65 | HR 92 | Temp 98.0°F | Resp 18 | Ht 61.0 in | Wt 144.5 lb

## 2022-03-02 DIAGNOSIS — Z171 Estrogen receptor negative status [ER-]: Secondary | ICD-10-CM

## 2022-03-02 MED ORDER — PALONOSETRON HCL INJECTION 0.25 MG/5ML
0.2500 mg | Freq: Once | INTRAVENOUS | Status: AC
  Administered 2022-03-02: 0.25 mg via INTRAVENOUS
  Filled 2022-03-02: qty 5

## 2022-03-02 MED ORDER — ACETAMINOPHEN 325 MG PO TABS
650.0000 mg | ORAL_TABLET | Freq: Once | ORAL | Status: AC
  Administered 2022-03-02: 650 mg via ORAL
  Filled 2022-03-02: qty 2

## 2022-03-02 MED ORDER — DEXTROSE 5 % IV SOLN: Freq: Once | INTRAVENOUS | Status: AC

## 2022-03-02 MED ORDER — DIPHENHYDRAMINE HCL 25 MG PO CAPS
50.0000 mg | ORAL_CAPSULE | Freq: Once | ORAL | Status: AC
  Administered 2022-03-02: 50 mg via ORAL
  Filled 2022-03-02: qty 2

## 2022-03-02 MED ORDER — SODIUM CHLORIDE 0.9% FLUSH
10.0000 mL | INTRAVENOUS | Status: DC | PRN
  Administered 2022-03-02: 10 mL

## 2022-03-02 MED ORDER — FAM-TRASTUZUMAB DERUXTECAN-NXKI CHEMO 100 MG IV SOLR
3.4000 mg/kg | Freq: Once | INTRAVENOUS | Status: AC
  Administered 2022-03-02: 240 mg via INTRAVENOUS
  Filled 2022-03-02: qty 12

## 2022-03-02 MED ORDER — SODIUM CHLORIDE 0.9 % IV SOLN
10.0000 mg | Freq: Once | INTRAVENOUS | Status: AC
  Administered 2022-03-02: 10 mg via INTRAVENOUS
  Filled 2022-03-02: qty 10

## 2022-03-02 MED ORDER — HEPARIN SOD (PORK) LOCK FLUSH 100 UNIT/ML IV SOLN
500.0000 [IU] | Freq: Once | INTRAVENOUS | Status: AC | PRN
  Administered 2022-03-02: 500 [IU]

## 2022-03-02 NOTE — Patient Instructions (Signed)
[facility]  Discharge Instructions: ?Thank you for choosing [facility] to provide your oncology and hematology care.  ?If you have a lab appointment with the [facility], please go directly to the [facility] and check in at the registration area. ?  ?Wear comfortable clothing and clothing appropriate for easy access to any Portacath or PICC line.  ? ?We strive to give you quality time with your provider. You may need to reschedule your appointment if you arrive late (15 or more minutes).  Arriving late affects you and other patients whose appointments are after yours.  Also, if you miss three or more appointments without notifying the office, you may be dismissed from the clinic at the provider?s discretion.    ?  ?For prescription refill requests, have your pharmacy contact our office and allow 72 hours for refills to be completed.   ? ?Today you received the following chemotherapy and/or immunotherapy agents Fam Trastuzumab  ?  ?To help prevent nausea and vomiting after your treatment, we encourage you to take your nausea medication as directed. ? ?BELOW ARE SYMPTOMS THAT SHOULD BE REPORTED IMMEDIATELY: ?*FEVER GREATER THAN 100.4 F (38 ?C) OR HIGHER ?*CHILLS OR SWEATING ?*NAUSEA AND VOMITING THAT IS NOT CONTROLLED WITH YOUR NAUSEA MEDICATION ?*UNUSUAL SHORTNESS OF BREATH ?*UNUSUAL BRUISING OR BLEEDING ?*URINARY PROBLEMS (pain or burning when urinating, or frequent urination) ?*BOWEL PROBLEMS (unusual diarrhea, constipation, pain near the anus) ?TENDERNESS IN MOUTH AND THROAT WITH OR WITHOUT PRESENCE OF ULCERS (sore throat, sores in mouth, or a toothache) ?UNUSUAL RASH, SWELLING OR PAIN  ?UNUSUAL VAGINAL DISCHARGE OR ITCHING  ? ?Items with * indicate a potential emergency and should be followed up as soon as possible or go to the Emergency Department if any problems should occur. ? ?Please show the CHEMOTHERAPY ALERT CARD or IMMUNOTHERAPY ALERT CARD at check-in to the  Emergency Department and triage nurse. ? ?Should you have questions after your visit or need to cancel or reschedule your appointment, please contact [facility]  Dept: [phone]  and follow the prompts.  Office hours are 8:00 a.m. to 4:30 p.m. Monday - Friday. Please note that voicemails left after 4:00 p.m. may not be returned until the following business day.  We are closed weekends and major holidays. You have access to a nurse at all times for urgent questions. Please call the main number to the clinic Dept: [phone] and follow the prompts. ? ?For any non-urgent questions, you may also contact your provider using MyChart. We now offer e-Visits for anyone 19 and older to request care online for non-urgent symptoms. For details visit [email or web address]. ?  ?Also download the MyChart app! Go to the app store, search "MyChart", open the app, select [facility], and log in with your MyChart username and password. ? ?Due to Covid, a mask is required upon entering the hospital/clinic. If you do not have a mask, one will be given to you upon arrival. For doctor visits, patients may have 1 support person aged 23 or older with them. For treatment visits, patients cannot have anyone with them due to current Covid guidelines and our immunocompromised population.  ? ? ?

## 2022-03-02 NOTE — Progress Notes (Signed)
1351:PT STABLE AT TIME OF DISCHARGE ?

## 2022-03-02 NOTE — Progress Notes (Signed)
Patient is receiving Assistance Medication - Supplied Externally. ?Medication: Enhertu ?Manufacture: Enhertu 4U ?Approval Dates: Approved from 03/02/2022 until 03/02/2023. ?ID: [id number] ?Reason: No Insurance ?First DOS:  ?  ?

## 2022-03-02 NOTE — Progress Notes (Signed)
Sent in renewal application for patients Enhertu to [employer]. ?

## 2022-03-15 NOTE — Progress Notes (Signed)
Sent in request for DOS 05/24 and 05/26 to [employer].

## 2022-03-20 NOTE — Progress Notes (Signed)
[facility]  [address] [place],  [place]  [address] [phone]  Clinic Day:  02/28/2022  Referring physician: Marguerita Merles, MD  HISTORY OF PRESENT ILLNESS:  The patient is a 71 y.o. 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  She also had a subcarinal lymph node for which scans have shown a positive response to therapy.  She comes in today prior to go over her CT scans after receiving 16 cycles of Enhertu.  The patient states that she tolerated her 16th cycle of treatment very well.  She denies having any new symptoms or findings which concern her for possible disease progression.    VITALS:  There were no vitals taken for this visit.  Wt Readings from Last 3 Encounters:  03/02/22 144 lb 8 oz (65.5 kg)  02/28/22 147 lb 11.2 oz (67 kg)  02/27/22 146 lb 8 oz (66.5 kg)    There is no height or weight on file to calculate BMI.  Performance status (ECOG): 1 - Symptomatic but completely ambulatory  PHYSICAL EXAM:  Physical Exam Constitutional:      General: She is not in acute distress.    Appearance: Normal appearance. She is normal weight.  HENT:     Head: Normocephalic and atraumatic.  Eyes:     General: No scleral icterus.    Extraocular Movements: Extraocular movements intact.     Conjunctiva/sclera: Conjunctivae normal.     Pupils: Pupils are equal, round, and reactive to light.  Cardiovascular:     Rate and Rhythm: Normal rate and regular rhythm.     Pulses: Normal pulses.     Heart sounds: Normal heart sounds. No murmur heard.   No friction rub. No gallop.  Pulmonary:     Effort: Pulmonary effort is normal. No respiratory distress.     Breath sounds: Normal breath sounds.  Abdominal:     General: Bowel sounds are normal. There is no distension.     Palpations: Abdomen is soft. There is no hepatomegaly, splenomegaly or  mass.     Tenderness: There is no abdominal tenderness.  Musculoskeletal:        General: Normal range of motion.     Cervical back: Normal range of motion and neck supple.     Right lower leg: No edema.     Left lower leg: No edema.  Lymphadenopathy:     Cervical: No cervical adenopathy.  Skin:    General: Skin is warm and dry.  Neurological:     General: No focal deficit present.     Mental Status: She is alert and oriented to person, place, and time. Mental status is at baseline.  Psychiatric:        Mood and Affect: Mood normal.        Behavior: Behavior normal.        Thought Content: Thought content normal.        Judgment: Judgment normal.   SCANS:  Her chest CT revealed the following:   FINDINGS:  Cardiovascular: Accessed right chest wall Port-A-Cath with tip in  the right atrium. Normal caliber thoracic aorta. No central  pulmonary embolus on this nondedicated study. Normal size heart. No  significant pericardial effusion/thickening.  Mediastinum/Nodes: No discrete thyroid nodule. No pathologically  enlarged mediastinal, hilar or axillary lymph nodes. Prior left  axillary lymph node dissection. Large hiatal hernia.  Lungs/Pleura: No suspicious  pulmonary nodules or masses. No pleural  effusion. No pneumothorax. Hypoventilatory change in the dependent  lungs.  Upper Abdomen: Stable hypodense hepatic cysts measuring up to 4.7 cm  and hypodense hepatic lesions which are technically too small to  accurately characterize but statistically likely to reflect benign  hepatic cysts. No acute abnormality. Aortic atherosclerosis. Musculoskeletal: Thoracolumbar degenerative change. No aggressive  lytic or blastic lesion of bone.   IMPRESSION:  1. No evidence of metastatic disease within the chest.  2. Large hiatal hernia.  3. Aortic Atherosclerosis (ICD10-I70.0).   ASSESSMENT & PLAN:  Assessment/Plan:  A 71 y.o. female with metastatic HER2 Neu receptor positive breast  cancer, status post a suboccipital brain metastatecomy in April 2022.  She also has had subcarinal nodal disease.  In clinic today, I went over her chest CT images with her, for which she could see breast, he remains disease-free.  Based upon this, she will proceed with her 17th cycle of Enhertu this week.  Overall, she appears to be doing well.  I will see her back in 3 weeks before she heads into her 18th cycle of treatment.  The patient understands all the plans discussed today and is in agreement with them.    Rudy Domek Macarthur Critchley, MD

## 2022-03-21 ENCOUNTER — Other Ambulatory Visit: Payer: Self-pay | Admitting: Oncology

## 2022-03-21 ENCOUNTER — Inpatient Hospital Stay (HOSPITAL_BASED_OUTPATIENT_CLINIC_OR_DEPARTMENT_OTHER): Payer: Self-pay | Admitting: Oncology

## 2022-03-21 ENCOUNTER — Inpatient Hospital Stay: Payer: Self-pay

## 2022-03-21 VITALS — BP 125/67 | HR 69 | Temp 98.3°F | Resp 14 | Ht 61.0 in | Wt 147.6 lb

## 2022-03-21 DIAGNOSIS — Z171 Estrogen receptor negative status [ER-]: Secondary | ICD-10-CM

## 2022-03-21 DIAGNOSIS — C50412 Malignant neoplasm of upper-outer quadrant of left female breast: Secondary | ICD-10-CM

## 2022-03-21 DIAGNOSIS — C50912 Malignant neoplasm of unspecified site of left female breast: Secondary | ICD-10-CM

## 2022-03-21 LAB — HEPATIC FUNCTION PANEL
ALT: 32 U/L (ref 7–35)
AST: 46 — AB (ref 13–35)
Alkaline Phosphatase: 116 (ref 25–125)
Bilirubin, Total: 0.6

## 2022-03-21 LAB — CBC AND DIFFERENTIAL
HCT: 35 — AB (ref 36–46)
Hemoglobin: 11.1 — AB (ref 12.0–16.0)
Neutrophils Absolute: 1.33
Platelets: 185 10*3/uL (ref 150–400)
WBC: 2.5

## 2022-03-21 LAB — BASIC METABOLIC PANEL
BUN: 9 (ref 4–21)
CO2: 21 (ref 13–22)
Chloride: 110 — AB (ref 99–108)
Creatinine: 0.5 (ref 0.5–1.1)
Glucose: 110
Potassium: 3.8 mEq/L (ref 3.5–5.1)
Sodium: 141 (ref 137–147)

## 2022-03-21 LAB — COMPREHENSIVE METABOLIC PANEL
Albumin: 3.9 (ref 3.5–5.0)
Calcium: 9.3 (ref 8.7–10.7)

## 2022-03-21 LAB — CBC: RBC: 3.72 — AB (ref 3.87–5.11)

## 2022-03-22 ENCOUNTER — Encounter: Payer: Self-pay | Admitting: Oncology

## 2022-03-22 MED FILL — Fam-Trastuzumab Deruxtecan-nxki For IV Soln 100 MG: INTRAVENOUS | Qty: 12 | Status: AC

## 2022-03-22 MED FILL — Dexamethasone Sodium Phosphate Inj 100 MG/10ML: INTRAMUSCULAR | Qty: 1 | Status: AC

## 2022-03-22 NOTE — Progress Notes (Signed)
Ok to proceed with chemo despite ANC=1330 per Dr. Bobby Rumpf.  Pt will receive zarxio prior to next cycle.

## 2022-03-23 ENCOUNTER — Inpatient Hospital Stay: Payer: Self-pay

## 2022-03-23 VITALS — BP 125/76 | HR 61 | Temp 98.1°F | Resp 18 | Ht 62.0 in | Wt 148.0 lb

## 2022-03-23 DIAGNOSIS — Z171 Estrogen receptor negative status [ER-]: Secondary | ICD-10-CM

## 2022-03-23 MED ORDER — DIPHENHYDRAMINE HCL 25 MG PO CAPS
50.0000 mg | ORAL_CAPSULE | Freq: Once | ORAL | Status: AC
  Administered 2022-03-23: 50 mg via ORAL
  Filled 2022-03-23: qty 2

## 2022-03-23 MED ORDER — DEXTROSE 5 % IV SOLN: Freq: Once | INTRAVENOUS | Status: AC

## 2022-03-23 MED ORDER — SODIUM CHLORIDE 0.9 % IV SOLN
10.0000 mg | Freq: Once | INTRAVENOUS | Status: AC
  Administered 2022-03-23: 10 mg via INTRAVENOUS
  Filled 2022-03-23: qty 10

## 2022-03-23 MED ORDER — FAM-TRASTUZUMAB DERUXTECAN-NXKI CHEMO 100 MG IV SOLR
3.4000 mg/kg | Freq: Once | INTRAVENOUS | Status: AC
  Administered 2022-03-23: 240 mg via INTRAVENOUS
  Filled 2022-03-23: qty 12

## 2022-03-23 MED ORDER — PALONOSETRON HCL INJECTION 0.25 MG/5ML
0.2500 mg | Freq: Once | INTRAVENOUS | Status: AC
  Administered 2022-03-23: 0.25 mg via INTRAVENOUS
  Filled 2022-03-23: qty 5

## 2022-03-23 MED ORDER — ACETAMINOPHEN 325 MG PO TABS
650.0000 mg | ORAL_TABLET | Freq: Once | ORAL | Status: AC
  Administered 2022-03-23: 650 mg via ORAL
  Filled 2022-03-23: qty 2

## 2022-03-23 MED ORDER — HEPARIN SOD (PORK) LOCK FLUSH 100 UNIT/ML IV SOLN
500.0000 [IU] | Freq: Once | INTRAVENOUS | Status: AC | PRN
  Administered 2022-03-23: 500 [IU]

## 2022-03-23 MED ORDER — SODIUM CHLORIDE 0.9% FLUSH
10.0000 mL | INTRAVENOUS | Status: DC | PRN
  Administered 2022-03-23: 10 mL

## 2022-03-23 NOTE — Patient Instructions (Signed)
[facility]  Discharge Instructions: Thank you for choosing [facility] to provide your oncology and hematology care.  If you have a lab appointment with the [facility], please go directly to the [facility] and check in at the registration area.   Wear comfortable clothing and clothing appropriate for easy access to any Portacath or PICC line.   We strive to give you quality time with your provider. You may need to reschedule your appointment if you arrive late (15 or more minutes).  Arriving late affects you and other patients whose appointments are after yours.  Also, if you miss three or more appointments without notifying the office, you may be dismissed from the clinic at the provider's discretion.      For prescription refill requests, have your pharmacy contact our office and allow 72 hours for refills to be completed.    Today you received the following chemotherapy and/or immunotherapy agents : Fam-trastuzumab.     To help prevent nausea and vomiting after your treatment, we encourage you to take your nausea medication as directed.  BELOW ARE SYMPTOMS THAT SHOULD BE REPORTED IMMEDIATELY: *FEVER GREATER THAN 100.4 F (38 C) OR HIGHER *CHILLS OR SWEATING *NAUSEA AND VOMITING THAT IS NOT CONTROLLED WITH YOUR NAUSEA MEDICATION *UNUSUAL SHORTNESS OF BREATH *UNUSUAL BRUISING OR BLEEDING *URINARY PROBLEMS (pain or burning when urinating, or frequent urination) *BOWEL PROBLEMS (unusual diarrhea, constipation, pain near the anus) TENDERNESS IN MOUTH AND THROAT WITH OR WITHOUT PRESENCE OF ULCERS (sore throat, sores in mouth, or a toothache) UNUSUAL RASH, SWELLING OR PAIN  UNUSUAL VAGINAL DISCHARGE OR ITCHING   Items with * indicate a potential emergency and should be followed up as soon as possible or go to the Emergency Department if any problems should occur.  Please show the CHEMOTHERAPY ALERT CARD or IMMUNOTHERAPY ALERT CARD at check-in to  the Emergency Department and triage nurse.  Should you have questions after your visit or need to cancel or reschedule your appointment, please contact [facility]  Dept: [phone]  and follow the prompts.  Office hours are 8:00 a.m. to 4:30 p.m. Monday - Friday. Please note that voicemails left after 4:00 p.m. may not be returned until the following business day.  We are closed weekends and major holidays. You have access to a nurse at all times for urgent questions. Please call the main number to the clinic Dept: [phone] and follow the prompts.  For any non-urgent questions, you may also contact your provider using MyChart. We now offer e-Visits for anyone 35 and older to request care online for non-urgent symptoms. For details visit [email or web address].   Also download the MyChart app! Go to the app store, search "MyChart", open the app, select [facility], and log in with your MyChart username and password.  Due to Covid, a mask is required upon entering the hospital/clinic. If you do not have a mask, one will be given to you upon arrival. For doctor visits, patients may have 1 support person aged 50 or older with them. For treatment visits, patients cannot have anyone with them due to current Covid guidelines and our immunocompromised population.

## 2022-03-28 ENCOUNTER — Telehealth: Payer: Self-pay

## 2022-03-28 ENCOUNTER — Other Ambulatory Visit: Payer: Self-pay

## 2022-03-28 DIAGNOSIS — T451X5A Adverse effect of antineoplastic and immunosuppressive drugs, initial encounter: Secondary | ICD-10-CM

## 2022-03-28 MED ORDER — PROCHLORPERAZINE MALEATE 10 MG PO TABS: 10.0000 mg | ORAL_TABLET | Freq: Four times a day (QID) | ORAL | 3 refills | Status: AC | PRN

## 2022-03-28 MED ORDER — ONDANSETRON HCL 4 MG PO TABS: 4.0000 mg | ORAL_TABLET | ORAL | 3 refills | Status: AC | PRN

## 2022-03-28 NOTE — Telephone Encounter (Addendum)
04/05/22 - I spoke with pt's daughter, Verdis Frederickson. She states her mom is using the antiemetics, no further N/V.  03/29/2022  - I called to check on pt today. I spoke with her daughter,Maria, who states she hadn't picked up the nausea meds yet. I asked if her mom was having any vomiting today. She states, "I don't know". I encouraged them to please call us if N/V persists.   03/28/22 - Pt's daughter called to request something for nausea. She states, "she has been having nausea and vomiting for about 4 days". I asked if she was able to drink and eat any? The daughter states yes, but she just gets nauseous. There are no antiemetics in the home per the daughter, so I set up the refill prescriptions for you. I asked if she thought her mom needed to come in for labs and fluids? She states, "we will try the nausea meds first". I asked her to please call us first thing in the morning if N/V doesn't subside with meds. She verbalized understanding. Above message sent to [facility] @ 1330-awc.

## 2022-03-30 NOTE — Progress Notes (Signed)
Sent in request for DOS 04/04/2022 to [employer].

## 2022-04-03 ENCOUNTER — Other Ambulatory Visit: Payer: Self-pay | Admitting: Pharmacist

## 2022-04-04 ENCOUNTER — Inpatient Hospital Stay: Payer: Self-pay | Attending: Oncology

## 2022-04-04 ENCOUNTER — Encounter: Payer: Self-pay | Admitting: Oncology

## 2022-04-04 VITALS — BP 117/66 | HR 95 | Temp 98.1°F | Resp 18 | Ht 62.0 in | Wt 148.0 lb

## 2022-04-04 DIAGNOSIS — Z170 Estrogen receptor positive status [ER+]: Secondary | ICD-10-CM | POA: Insufficient documentation

## 2022-04-04 DIAGNOSIS — Z5112 Encounter for antineoplastic immunotherapy: Secondary | ICD-10-CM | POA: Insufficient documentation

## 2022-04-04 DIAGNOSIS — T451X5A Adverse effect of antineoplastic and immunosuppressive drugs, initial encounter: Secondary | ICD-10-CM | POA: Insufficient documentation

## 2022-04-04 DIAGNOSIS — D702 Other drug-induced agranulocytosis: Secondary | ICD-10-CM

## 2022-04-04 DIAGNOSIS — D649 Anemia, unspecified: Secondary | ICD-10-CM | POA: Insufficient documentation

## 2022-04-04 DIAGNOSIS — C770 Secondary and unspecified malignant neoplasm of lymph nodes of head, face and neck: Secondary | ICD-10-CM | POA: Insufficient documentation

## 2022-04-04 DIAGNOSIS — C50919 Malignant neoplasm of unspecified site of unspecified female breast: Secondary | ICD-10-CM | POA: Insufficient documentation

## 2022-04-04 DIAGNOSIS — C7931 Secondary malignant neoplasm of brain: Secondary | ICD-10-CM | POA: Insufficient documentation

## 2022-04-04 DIAGNOSIS — D701 Agranulocytosis secondary to cancer chemotherapy: Secondary | ICD-10-CM | POA: Insufficient documentation

## 2022-04-04 MED ORDER — FILGRASTIM-SNDZ 480 MCG/0.8ML IJ SOSY
480.0000 ug | PREFILLED_SYRINGE | Freq: Once | INTRAMUSCULAR | Status: AC
  Administered 2022-04-04: 480 ug via SUBCUTANEOUS
  Filled 2022-04-04: qty 0.8

## 2022-04-04 NOTE — Patient Instructions (Signed)
Filgrastim, G-CSF injection Qu es este medicamento? El FILGRASTIM, G-CSF es un factor estimulante de colonias de granulocitos que estimula el crecimiento de los neutrfilos, un tipo de glbulo blanco importante en la lucha del cuerpo contra las infecciones. [place] para reducir la incidencia de fiebre e infeccin en pacientes con ciertos tipos de cncer que estn recibiendo quimioterapia que afecta la mdula sea, para estimular la produccin de clulas sanguneas a fin de eliminar los glbulos blancos del cuerpo antes de un trasplante de mdula sea, para reducir la incidencia de fiebre e infeccin en pacientes que tienen neutropenia crnica grave, y para [employer] de supervivencia despus de exposicin a radiacin de dosis alta que es txica para la mdula sea. Este medicamento puede ser utilizado para otros usos; si tiene alguna pregunta consulte con su proveedor de atencin mdica o con su farmacutico. MARCAS COMUNES: Neupogen, Nivestym, Releuko, Zarxio [employer] debo informar a mi profesional de la salud antes de tomar este medicamento? Necesitan saber si usted presenta alguno de los siguientes problemas o situaciones: enfermedad renal alergia al ltex terapia de radiacin en curso enfermedad de clulas falciformes una reaccin alrgica o inusual al filgrastim, pegfilgrastim, a otros medicamentos, alimentos, colorantes o conservantes si est embarazada o buscando quedar embarazada si est amamantando a un beb Cmo debo utilizar este medicamento? Este medicamento se administra mediante una inyeccin por va subcutnea o mediante infusin por va intravenosa. Cuando es mediante infusin por va intravenosa, generalmente lo administra un profesional de la salud en un hospital o en un entorno clnico. Si recibe Coca-Cola en su casa, le ensearn cmo prepararlo y administrarlo. Consulte las Instrucciones de uso que vienen con el envase de su medicamento. Use el medicamento  exactamente como se le indique. Use su medicamento a intervalos regulares. No use su medicamento con una frecuencia mayor a la indicada. Es importante que deseche las agujas y las jeringas usadas en un recipiente resistente a los pinchazos. No las deseche en la basura. Si no tiene un recipiente resistente a los pinchazos, llame a su farmacutico o proveedor de atencin de la salud para obtenerlo. Hable con su pediatra para informarse acerca del uso de este medicamento en nios. Aunque este medicamento se puede recetar a nios tan pequeos como de 7 meses de edad con ciertas afecciones, existen precauciones que deben tomarse. Sobredosis: Pngase en contacto inmediatamente con un centro toxicolgico o una sala de urgencia si usted cree que haya tomado demasiado medicamento. ATENCIN: [employer] es solo para usted. No comparta este medicamento con nadie. Qu sucede si me olvido de una dosis? Es importante no olvidar ninguna dosis. Hable con su mdico o profesional de la salud si [employer] dosis. Qu puede interactuar con este medicamento? Este medicamento podra interactuar con los siguientes frmacos: medicamentos que pueden causar una liberacin de neutrfilos, tales como litio Puede ser que esta lista no menciona todas las posibles interacciones. Informe a su profesional de [employer]	[place] de [employer] productos a base de hierbas, medicamentos de [place] o suplementos nutritivos que est tomando. Si usted fuma, consume bebidas alcohlicas o si utiliza drogas ilegales, indqueselo tambin a su profesional de [employer]	[place]. Algunas sustancias pueden interactuar con su medicamento. A qu debo estar atento al usar Coca-Cola? Se supervisar su estado de salud atentamente mientras reciba este medicamento. Usted podra necesitar realizarse [employer] de sangre mientras est usando [place]. Hable con su proveedor de atencin mdica sobre su riesgo de cncer. Usted puede tener mayor riesgo  para  ciertos tipos de cncer si [place] este medicamento. Qu efectos secundarios puedo tener al [employer] este medicamento? Efectos secundarios que debe informar a su mdico o a Barrister's clerk de la salud tan pronto como sea posible: Chief of Staff, tales como erupcin cutnea, comezn/picazn o urticaria, e hinchazn de la cara, los labios o la Holiday representative de espalda mareos o sensacin de Marine scientist, enrojecimiento o Actor de la inyeccin puntos rojos en la piel falta de aire o problemas respiratorios signos y sntomas de lesin al rin, tales como dificultad para Garment/textile technologist, cambios en la cantidad de [place], u orina color rojo o Forensic psychologist oscuro dolor de Paramedic o del costado, o Social research officer, government en el hombro hinchazn cansancio sangrado o moretones inusuales Efectos secundarios que generalmente no requieren atencin mdica (infrmelos a su mdico o a Barrister's clerk de la salud si persisten o si son molestos): dolor de huesos tos diarrea cada del cabello dolor de Research officer, trade union Puede ser que esta lista no menciona todos los posibles efectos secundarios. Comunquese a su mdico por asesoramiento mdico [employer]. Usted puede informar los efectos secundarios a la FDA por telfono al 1-800-FDA-1088. Dnde debo guardar mi medicina? Mantenga fuera del alcance de los nios. Guarde en el refrigerador a una temperatura de [place] 2 y 68 grados Celsius (entre 31 y 80 grados Fahrenheit). No congele. Mantenga en el envase para proteger de Naval architect. Deseche este medicamento si los frascos o jeringas quedan fuera del refrigerador durante ms de 24 horas. Deseche todo el medicamento que no haya utilizado despus de la fecha de vencimiento. ATENCIN: Este folleto es un resumen. Puede ser que no cubra toda la posible informacin. Si usted tiene preguntas acerca de esta medicina, consulte con su mdico, su farmacutico o su profesional de Technical sales engineer.  2023 Elsevier/Gold  Standard (2020-04-19 00:00:00)

## 2022-04-05 ENCOUNTER — Encounter: Payer: Self-pay | Admitting: Oncology

## 2022-04-06 NOTE — Progress Notes (Signed)
Sent in request for DOS 04/11/2022 and 04/13/2022 to [employer].

## 2022-04-10 NOTE — Progress Notes (Unsigned)
[facility]  [address] [place],  [place]  [address] [phone]  Clinic Day:  04/11/2022  Referring physician: Marguerita Merles, MD  HISTORY OF PRESENT ILLNESS:  The patient is a 71 y.o. 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  She also had a subcarinal lymph node for which scans have shown a positive response to therapy. She comes in today to be evaluated before heading into her 19th cycle of Enhertu.  The patient claims that she tolerated her 18th cycle of treatment well.  However, he had extreme problems, when her white cell shot therapy, which was given due to her neutropenia.  She had diffuse bone pain and headaches, which caused significant distress.  Otherwise, she denies having any new symptoms or findings which concern her for possible disease progression.    VITALS:  Blood pressure 115/71, pulse 72, temperature 97.9 F (36.6 C), resp. rate 14, height 5' 1" (1.549 m), weight 145 lb 14.4 oz (66.2 kg), SpO2 98 %.  Wt Readings from Last 3 Encounters:  04/11/22 145 lb 14.4 oz (66.2 kg)  04/04/22 148 lb (67.1 kg)  03/23/22 148 lb 0.6 oz (67.2 kg)    Body mass index is 27.57 kg/m.  Performance status (ECOG): 1 - Symptomatic but completely ambulatory  PHYSICAL EXAM:  Physical Exam Constitutional:      General: She is not in acute distress.    Appearance: Normal appearance. She is normal weight.  HENT:     Head: Normocephalic and atraumatic.  Eyes:     General: No scleral icterus.    Extraocular Movements: Extraocular movements intact.     Conjunctiva/sclera: Conjunctivae normal.     Pupils: Pupils are equal, round, and reactive to light.  Cardiovascular:     Rate and Rhythm: Normal rate and regular rhythm.     Pulses: Normal pulses.     Heart sounds: Normal heart sounds. No murmur heard.    No friction rub. No gallop.   Pulmonary:     Effort: Pulmonary effort is normal. No respiratory distress.     Breath sounds: Normal breath sounds.  Abdominal:     General: Bowel sounds are normal. There is no distension.     Palpations: Abdomen is soft. There is no hepatomegaly, splenomegaly or mass.     Tenderness: There is no abdominal tenderness.  Musculoskeletal:        General: Normal range of motion.     Cervical back: Normal range of motion and neck supple.     Right lower leg: No edema.     Left lower leg: No edema.  Lymphadenopathy:     Cervical: No cervical adenopathy.  Skin:    General: Skin is warm and dry.  Neurological:     General: No focal deficit present.     Mental Status: She is alert and oriented to person, place, and time. Mental status is at baseline.  Psychiatric:        Mood and Affect: Mood normal.        Behavior: Behavior normal.        Thought Content: Thought content normal.        Judgment: Judgment normal.    LABS:   ASSESSMENT & PLAN:  Assessment/Plan:  A 71 y.o. 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  She also  has had subcarinal nodal disease that has resolved over time with therapy.  When evaluating her labs today, the patient is still neutropenic and progressively anemic.  I do believe her cytopenias are due to her Enhertu therapy.  I will give her an additional week off treatment to allow her bone marrow more time to recuperate and make a better amount of blood cells.  She will proceed with her 19th cycle of Enhertu next week.  Due to how poorly she tolerated white cell shot therapy, even if she is mildly neutropenic, I would likely avoid additional white cell shots.  Ultimately, this patient's Enhertu therapy may need to be spaced out to every 4 weeks to give her a bone marrow an extra week to recuperate.  Overall, she appears to be doing well.  I will see her back in 4 weeks before she  heads into her 20th cycle of Enhertu therapy.  The patient understands all the plans discussed today and is in agreement with them.    [name], MD

## 2022-04-11 ENCOUNTER — Inpatient Hospital Stay: Payer: Self-pay

## 2022-04-11 ENCOUNTER — Inpatient Hospital Stay (HOSPITAL_BASED_OUTPATIENT_CLINIC_OR_DEPARTMENT_OTHER): Payer: Self-pay | Admitting: Oncology

## 2022-04-11 ENCOUNTER — Other Ambulatory Visit: Payer: Self-pay

## 2022-04-11 ENCOUNTER — Other Ambulatory Visit: Payer: Self-pay | Admitting: Oncology

## 2022-04-11 VITALS — BP 115/71 | HR 72 | Temp 97.9°F | Resp 14 | Ht 61.0 in | Wt 145.9 lb

## 2022-04-11 DIAGNOSIS — C50412 Malignant neoplasm of upper-outer quadrant of left female breast: Secondary | ICD-10-CM

## 2022-04-11 DIAGNOSIS — Z171 Estrogen receptor negative status [ER-]: Secondary | ICD-10-CM

## 2022-04-11 DIAGNOSIS — C50912 Malignant neoplasm of unspecified site of left female breast: Secondary | ICD-10-CM

## 2022-04-11 LAB — BASIC METABOLIC PANEL
BUN: 11 (ref 4–21)
CO2: 22 (ref 13–22)
Chloride: 110 — AB (ref 99–108)
Creatinine: 0.6 (ref 0.5–1.1)
Glucose: 144
Potassium: 3.7 mEq/L (ref 3.5–5.1)
Sodium: 143 (ref 137–147)

## 2022-04-11 LAB — CBC: RBC: 3.35 — AB (ref 3.87–5.11)

## 2022-04-11 LAB — HEPATIC FUNCTION PANEL
ALT: 33 U/L (ref 7–35)
AST: 47 — AB (ref 13–35)
Alkaline Phosphatase: 118 (ref 25–125)
Bilirubin, Total: 0.5

## 2022-04-11 LAB — CBC AND DIFFERENTIAL
HCT: 31 — AB (ref 36–46)
Hemoglobin: 9.7 — AB (ref 12.0–16.0)
Neutrophils Absolute: 0.88
Platelets: 174 10*3/uL (ref 150–400)
WBC: 2.1

## 2022-04-11 LAB — COMPREHENSIVE METABOLIC PANEL
Albumin: 3.8 (ref 3.5–5.0)
Calcium: 8.8 (ref 8.7–10.7)

## 2022-04-13 ENCOUNTER — Ambulatory Visit: Payer: Self-pay

## 2022-04-16 NOTE — Progress Notes (Signed)
Sent in request for DOS 04/18/2022 and 04/20/2022 to [employer].

## 2022-04-17 ENCOUNTER — Other Ambulatory Visit: Payer: Self-pay

## 2022-04-18 ENCOUNTER — Encounter: Payer: Self-pay | Admitting: Oncology

## 2022-04-18 ENCOUNTER — Inpatient Hospital Stay: Payer: Self-pay

## 2022-04-18 ENCOUNTER — Other Ambulatory Visit: Payer: Self-pay

## 2022-04-18 DIAGNOSIS — C50912 Malignant neoplasm of unspecified site of left female breast: Secondary | ICD-10-CM

## 2022-04-18 LAB — BASIC METABOLIC PANEL
BUN: 13 (ref 4–21)
CO2: 23 — AB (ref 13–22)
Chloride: 111 — AB (ref 99–108)
Creatinine: 0.6 (ref 0.5–1.1)
Glucose: 128
Potassium: 3.7 mEq/L (ref 3.5–5.1)
Sodium: 143 (ref 137–147)

## 2022-04-18 LAB — HEPATIC FUNCTION PANEL
ALT: 36 U/L — AB (ref 7–35)
AST: 53 — AB (ref 13–35)
Alkaline Phosphatase: 116 (ref 25–125)
Bilirubin, Total: 0.5

## 2022-04-18 LAB — COMPREHENSIVE METABOLIC PANEL
Albumin: 3.9 (ref 3.5–5.0)
Calcium: 8.9 (ref 8.7–10.7)

## 2022-04-18 LAB — CBC: RBC: 3.19 — AB (ref 3.87–5.11)

## 2022-04-18 LAB — CBC AND DIFFERENTIAL
HCT: 29 — AB (ref 36–46)
Hemoglobin: 9.5 — AB (ref 12.0–16.0)
Neutrophils Absolute: 2.7
Platelets: 171 10*3/uL (ref 150–400)
WBC: 3.6

## 2022-04-19 ENCOUNTER — Other Ambulatory Visit: Payer: Self-pay | Admitting: Oncology

## 2022-04-19 ENCOUNTER — Encounter: Payer: Self-pay | Admitting: Oncology

## 2022-04-19 MED FILL — Fam-Trastuzumab Deruxtecan-nxki For IV Soln 100 MG: INTRAVENOUS | Qty: 12 | Status: AC

## 2022-04-19 MED FILL — Dexamethasone Sodium Phosphate Inj 100 MG/10ML: INTRAMUSCULAR | Qty: 1 | Status: AC

## 2022-04-20 ENCOUNTER — Other Ambulatory Visit: Payer: Self-pay

## 2022-04-20 ENCOUNTER — Inpatient Hospital Stay: Payer: Self-pay

## 2022-04-20 NOTE — Progress Notes (Signed)
Sent in request for DOS 04/26/2022 to [employer].

## 2022-04-25 ENCOUNTER — Other Ambulatory Visit: Payer: Self-pay | Admitting: Pharmacist

## 2022-04-25 MED FILL — Dexamethasone Sodium Phosphate Inj 100 MG/10ML: INTRAMUSCULAR | Qty: 1 | Status: AC

## 2022-04-26 ENCOUNTER — Inpatient Hospital Stay: Payer: Self-pay

## 2022-04-26 VITALS — BP 120/76 | HR 77 | Temp 98.3°F | Resp 16 | Ht 61.0 in | Wt 148.0 lb

## 2022-04-26 DIAGNOSIS — C50412 Malignant neoplasm of upper-outer quadrant of left female breast: Secondary | ICD-10-CM

## 2022-04-26 MED ORDER — ACETAMINOPHEN 325 MG PO TABS
650.0000 mg | ORAL_TABLET | Freq: Once | ORAL | Status: AC
  Administered 2022-04-26: 650 mg via ORAL
  Filled 2022-04-26: qty 2

## 2022-04-26 MED ORDER — SODIUM CHLORIDE 0.9 % IV SOLN
10.0000 mg | Freq: Once | INTRAVENOUS | Status: AC
  Administered 2022-04-26: 10 mg via INTRAVENOUS
  Filled 2022-04-26: qty 1
  Filled 2022-04-26: qty 10

## 2022-04-26 MED ORDER — FAM-TRASTUZUMAB DERUXTECAN-NXKI CHEMO 100 MG IV SOLR
3.4000 mg/kg | Freq: Once | INTRAVENOUS | Status: AC
  Administered 2022-04-26: 240 mg via INTRAVENOUS
  Filled 2022-04-26 (×2): qty 12

## 2022-04-26 MED ORDER — HEPARIN SOD (PORK) LOCK FLUSH 100 UNIT/ML IV SOLN
500.0000 [IU] | Freq: Once | INTRAVENOUS | Status: AC | PRN
  Administered 2022-04-26: 500 [IU]

## 2022-04-26 MED ORDER — SODIUM CHLORIDE 0.9% FLUSH
10.0000 mL | INTRAVENOUS | Status: DC | PRN
  Administered 2022-04-26: 10 mL

## 2022-04-26 MED ORDER — PALONOSETRON HCL INJECTION 0.25 MG/5ML
0.2500 mg | Freq: Once | INTRAVENOUS | Status: AC
  Administered 2022-04-26: 0.25 mg via INTRAVENOUS
  Filled 2022-04-26: qty 5

## 2022-04-26 MED ORDER — DEXTROSE 5 % IV SOLN: Freq: Once | INTRAVENOUS | Status: AC

## 2022-04-26 MED ORDER — DIPHENHYDRAMINE HCL 25 MG PO CAPS
50.0000 mg | ORAL_CAPSULE | Freq: Once | ORAL | Status: AC
  Administered 2022-04-26: 50 mg via ORAL
  Filled 2022-04-26: qty 2

## 2022-04-26 NOTE — Patient Instructions (Signed)
[facility]  Discharge Instructions: Thank you for choosing [facility] to provide your oncology and hematology care.  If you have a lab appointment with the [facility], please go directly to the [facility] and check in at the registration area.   Wear comfortable clothing and clothing appropriate for easy access to any Portacath or PICC line.   We strive to give you quality time with your provider. You may need to reschedule your appointment if you arrive late (15 or more minutes).  Arriving late affects you and other patients whose appointments are after yours.  Also, if you miss three or more appointments without notifying the office, you may be dismissed from the clinic at the provider's discretion.      For prescription refill requests, have your pharmacy contact our office and allow 72 hours for refills to be completed.    Today you received the following chemotherapy and/or immunotherapy agents enhertu   To help prevent nausea and vomiting after your treatment, we encourage you to take your nausea medication as directed.  BELOW ARE SYMPTOMS THAT SHOULD BE REPORTED IMMEDIATELY: *FEVER GREATER THAN 100.4 F (38 C) OR HIGHER *CHILLS OR SWEATING *NAUSEA AND VOMITING THAT IS NOT CONTROLLED WITH YOUR NAUSEA MEDICATION *UNUSUAL SHORTNESS OF BREATH *UNUSUAL BRUISING OR BLEEDING *URINARY PROBLEMS (pain or burning when urinating, or frequent urination) *BOWEL PROBLEMS (unusual diarrhea, constipation, pain near the anus) TENDERNESS IN MOUTH AND THROAT WITH OR WITHOUT PRESENCE OF ULCERS (sore throat, sores in mouth, or a toothache) UNUSUAL RASH, SWELLING OR PAIN  UNUSUAL VAGINAL DISCHARGE OR ITCHING   Items with * indicate a potential emergency and should be followed up as soon as possible or go to the Emergency Department if any problems should occur.  Please show the CHEMOTHERAPY ALERT CARD or IMMUNOTHERAPY ALERT CARD at check-in to the Emergency  Department and triage nurse.  Should you have questions after your visit or need to cancel or reschedule your appointment, please contact [facility]  Dept: [phone]  and follow the prompts.  Office hours are 8:00 a.m. to 4:30 p.m. Monday - Friday. Please note that voicemails left after 4:00 p.m. may not be returned until the following business day.  We are closed weekends and major holidays. You have access to a nurse at all times for urgent questions. Please call the main number to the clinic Dept: [phone] and follow the prompts.  For any non-urgent questions, you may also contact your provider using MyChart. We now offer e-Visits for anyone 58 and older to request care online for non-urgent symptoms. For details visit [email or web address].   Also download the MyChart app! Go to the app store, search "MyChart", open the app, select [facility], and log in with your MyChart username and password.  Masks are optional in the cancer centers. If you would like for your care team to wear a mask while they are taking care of you, please let them know. For doctor visits, patients may have with them one support person who is at least 71 years old. At this time, visitors are not allowed in the infusion area.  Fam-Trastuzumab Deruxtecan Injection What is this medication? FAM-TRASTUZUMAB DERUXTECAN (fam-tras TOOZ eu mab DER ux TEE kan) treats some types of cancer. It works by blocking a protein that causes cancer cells to grow and multiply. This helps to slow or stop the spread of cancer cells. This medicine may be used for other purposes;  ask your health care provider or pharmacist if you have questions. COMMON BRAND NAME(S): ENHERTU What should I tell my care team before I take this medication? They need to know if you have any of these conditions: Heart disease Heart failure Infection, especially a viral infection, such as chickenpox, cold sores, or herpes Liver  disease Lung or breathing disease, such as asthma or COPD An unusual or allergic reaction to fam-trastuzumab deruxtecan, other medications, foods, dyes, or preservatives Pregnant or trying to get pregnant Breast-feeding How should I use this medication? This medication is injected into a vein. It is given by your care team in a hospital or clinic setting. A special MedGuide will be given to you before each treatment. Be sure to read this information carefully each time. Talk to your care team about the use of this medication in children. Special care may be needed. Overdosage: If you think you have taken too much of this medicine contact a poison control center or emergency room at once. NOTE: This medicine is only for you. Do not share this medicine with others. What if I miss a dose? It is important not to miss your dose. Call your care team if you are unable to keep an appointment. What may interact with this medication? Interactions are not expected. This list may not describe all possible interactions. Give your health care provider a list of all the medicines, herbs, non-prescription drugs, or dietary supplements you use. Also tell them if you smoke, drink alcohol, or use illegal drugs. Some items may interact with your medicine. What should I watch for while using this medication? Visit your care team for regular checks on your progress. Tell your care team if your symptoms do not start to get better or if they get worse. Your condition will be monitored carefully while you are receiving this medication. Do not become pregnant while taking this medication or for 7 months after stopping it. Women should inform their care team if they wish to become pregnant or think they might be pregnant. Men should not father a child while taking this medication and for 4 months after stopping it. There is potential for serious side effects to an unborn child. Talk to your care team for more  information. Do not breast-feed an infant while taking this medication or for 7 months after the last dose. This medication has caused decreased sperm counts in some men. This may make it more difficult to father a child. Talk to your care team if you are concerned about your fertility. This medication may increase your risk to bruise or bleed. Call your care team if you notice any unusual bleeding. Be careful brushing or flossing your teeth or using a toothpick because you may get an infection or bleed more easily. If you have any dental work done, tell your dentist you are receiving this medication. This medication may cause dry eyes and blurred vision. If you wear contact lenses, you may feel some discomfort. Lubricating eye drops may help. See your care team if the problem does not go away or is severe. This medication may increase your risk of getting an infection. Call your care team for advice if you get a fever, chills, sore throat, or other symptoms of a cold or flu. Do not treat yourself. Try to avoid being around people who are sick. Avoid taking medications that contain aspirin, acetaminophen, ibuprofen, naproxen, or ketoprofen unless instructed by your care team. These medications may hide a fever. What  side effects may I notice from receiving this medication? Side effects that you should report to your care team as soon as possible: Allergic reactions--skin rash, itching, hives, swelling of the face, lips, tongue, or throat Dry cough, shortness of breath or trouble breathing Infection--fever, chills, cough, sore throat, wounds that don't heal, pain or trouble when passing urine, general feeling of discomfort or being unwell Heart failure--shortness of breath, swelling of the ankles, feet, or hands, sudden weight gain, unusual weakness or fatigue Unusual bruising or bleeding Side effects that usually do not require medical attention (report these to your care team if they continue or are  bothersome): Constipation Diarrhea Hair loss Muscle pain Nausea Vomiting This list may not describe all possible side effects. Call your doctor for medical advice about side effects. You may report side effects to FDA at 1-800-FDA-1088. Where should I keep my medication? This medication is given in a hospital or clinic. It will not be stored at home. NOTE: This sheet is a summary. It may not cover all possible information. If you have questions about this medicine, talk to your doctor, pharmacist, or health care provider.  2023 Elsevier/Gold Standard (2021-08-01 00:00:00)

## 2022-05-09 ENCOUNTER — Ambulatory Visit: Payer: Self-pay | Admitting: Oncology

## 2022-05-09 ENCOUNTER — Other Ambulatory Visit: Payer: Self-pay

## 2022-05-09 NOTE — Progress Notes (Signed)
Sent in request for DOS 05/15/2022 and 05/17/2022 to [employer].

## 2022-05-11 ENCOUNTER — Ambulatory Visit: Payer: Self-pay

## 2022-05-15 ENCOUNTER — Inpatient Hospital Stay: Payer: Self-pay | Attending: Oncology | Admitting: Oncology

## 2022-05-15 ENCOUNTER — Inpatient Hospital Stay: Payer: Self-pay

## 2022-05-15 ENCOUNTER — Other Ambulatory Visit: Payer: Self-pay | Admitting: Oncology

## 2022-05-15 VITALS — BP 132/77 | HR 72 | Temp 98.1°F | Resp 16 | Ht 61.0 in | Wt 151.9 lb

## 2022-05-15 DIAGNOSIS — C7931 Secondary malignant neoplasm of brain: Secondary | ICD-10-CM | POA: Insufficient documentation

## 2022-05-15 DIAGNOSIS — Z5112 Encounter for antineoplastic immunotherapy: Secondary | ICD-10-CM | POA: Insufficient documentation

## 2022-05-15 DIAGNOSIS — C50412 Malignant neoplasm of upper-outer quadrant of left female breast: Secondary | ICD-10-CM

## 2022-05-15 DIAGNOSIS — C50919 Malignant neoplasm of unspecified site of unspecified female breast: Secondary | ICD-10-CM | POA: Insufficient documentation

## 2022-05-15 DIAGNOSIS — Z171 Estrogen receptor negative status [ER-]: Secondary | ICD-10-CM

## 2022-05-15 DIAGNOSIS — C50912 Malignant neoplasm of unspecified site of left female breast: Secondary | ICD-10-CM

## 2022-05-15 DIAGNOSIS — Z79899 Other long term (current) drug therapy: Secondary | ICD-10-CM | POA: Insufficient documentation

## 2022-05-15 DIAGNOSIS — M549 Dorsalgia, unspecified: Secondary | ICD-10-CM | POA: Insufficient documentation

## 2022-05-15 LAB — URINALYSIS, COMPLETE (UACMP) WITH MICROSCOPIC
Bilirubin Urine: NEGATIVE
Glucose, UA: NEGATIVE mg/dL
Hgb urine dipstick: NEGATIVE
Ketones, ur: 5 mg/dL — AB
Leukocytes,Ua: NEGATIVE
Nitrite: NEGATIVE
Protein, ur: NEGATIVE mg/dL
Specific Gravity, Urine: 1.018 (ref 1.005–1.030)
pH: 5 (ref 5.0–8.0)

## 2022-05-15 LAB — BASIC METABOLIC PANEL
BUN: 15 (ref 4–21)
CO2: 23 — AB (ref 13–22)
Chloride: 110 — AB (ref 99–108)
Creatinine: 0.6 (ref 0.5–1.1)
Glucose: 94
Potassium: 3.8 mEq/L (ref 3.5–5.1)
Sodium: 138 (ref 137–147)

## 2022-05-15 LAB — CBC: RBC: 3.42 — AB (ref 3.87–5.11)

## 2022-05-15 LAB — HEPATIC FUNCTION PANEL
ALT: 33 U/L (ref 7–35)
AST: 51 — AB (ref 13–35)
Alkaline Phosphatase: 111 (ref 25–125)
Bilirubin, Total: 0.5

## 2022-05-15 LAB — CBC AND DIFFERENTIAL
HCT: 30 — AB (ref 36–46)
Hemoglobin: 9.7 — AB (ref 12.0–16.0)
Neutrophils Absolute: 1.65
Platelets: 212 10*3/uL (ref 150–400)
WBC: 3

## 2022-05-15 LAB — COMPREHENSIVE METABOLIC PANEL
Albumin: 3.9 (ref 3.5–5.0)
Calcium: 9.4 (ref 8.7–10.7)

## 2022-05-15 NOTE — Progress Notes (Signed)
[facility]  [address] [place],  [place]  [address] [phone]  Clinic Day:  05/15/2022  Referring physician: Marguerita Merles, MD  HISTORY OF PRESENT ILLNESS:  The patient is a 71 y.o. 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  She also had a subcarinal lymph node for which scans have shown a positive response to therapy. She comes in today to be evaluated before heading into her 20th cycle of Enhertu. The patient claims that she tolerated her 19th cycle of treatment fairly well.  However, she claims she is having more headaches and dizziness.  She also complains of heartburn.  She also complains of bone/back pain, which has been chronic in nature.  However, she is concerned that some of her back symptoms may be related to a urinary tract infection.   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  The patient received 6 cycles of Taxotere/Herceptin/Perjeta contained.  She was then before being switched to maintenance Perjeta/Herceptin, for which she took 73 cycles of treatment.  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  She had disease recurrence in early 2022, which manifested itself as a left suboccipital and subcarinal nodal metastasis.  This all happened in the setting of a prolonged treatment break from TDM-1.  Since recurrence was found, the patient has been on Enhertu, which began in May 2022.  VITALS:  Blood pressure 132/77, pulse 72, temperature 98.1 F (36.7 C), resp. rate 16, height 5' 1"  (1.549 m), weight 151 lb 14.4 oz (68.9 kg), SpO2 100 %.  Wt Readings from Last 3 Encounters:  05/15/22 151 lb 14.4 oz (68.9 kg)  04/26/22 148 lb 0.6 oz  (67.2 kg)  04/11/22 145 lb 14.4 oz (66.2 kg)    Body mass index is 28.7 kg/m.  Performance status (ECOG): 1 - Symptomatic but completely ambulatory  PHYSICAL EXAM:  Physical Exam Constitutional:      General: She is not in acute distress.    Appearance: Normal appearance. She is normal weight.  HENT:     Head: Normocephalic and atraumatic.  Eyes:     General: No scleral icterus.    Extraocular Movements: Extraocular movements intact.     Conjunctiva/sclera: Conjunctivae normal.     Pupils: Pupils are equal, round, and reactive to light.  Cardiovascular:     Rate and Rhythm: Normal rate and regular rhythm.     Pulses: Normal pulses.     Heart sounds: Normal heart sounds. No murmur heard.    No friction rub. No gallop.  Pulmonary:     Effort: Pulmonary effort is normal. No respiratory distress.     Breath sounds: Normal breath sounds.  Abdominal:     General: Bowel sounds are normal. There is no distension.     Palpations: Abdomen is soft. There is no hepatomegaly, splenomegaly or mass.     Tenderness: There is no abdominal tenderness.  Musculoskeletal:        General: Normal range of motion.     Cervical back: Normal range of motion and neck supple.     Right lower leg: No edema.     Left lower leg: No edema.  Lymphadenopathy:     Cervical:  No cervical adenopathy.  Skin:    General: Skin is warm and dry.  Neurological:     General: No focal deficit present.     Mental Status: She is alert and oriented to person, place, and time. Mental status is at baseline.  Psychiatric:        Mood and Affect: Mood normal.        Behavior: Behavior normal.        Thought Content: Thought content normal.        Judgment: Judgment normal.    LABS:    ASSESSMENT & PLAN:  Assessment/Plan:  A 71 y.o. 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  She also has had subcarinal nodal disease that has  resolved over time with therapy.  The patient will proceed with her 20th cycle of Enhertu this week.  Overall, she appears to be doing well.  However, I will get a urine sample today due to the back pain and urinary issues she has had recently.  Otherwise, I will see her back in 3 weeks before she heads into a potential 21st cycle of Enhertu treatment.  Before that visit, a brain MRI and CT scans of her chest/abdomen/pelvis will be done to ascertain her new disease baseline after 20 cycles of Enhertu.  The patient understands all the plans discussed today and is in agreement with them.    Teresa Ishikawa Macarthur Critchley, MD

## 2022-05-16 ENCOUNTER — Encounter: Payer: Self-pay | Admitting: Oncology

## 2022-05-16 MED FILL — Dexamethasone Sodium Phosphate Inj 100 MG/10ML: INTRAMUSCULAR | Qty: 1 | Status: AC

## 2022-05-16 MED FILL — Fam-Trastuzumab Deruxtecan-nxki For IV Soln 100 MG: INTRAVENOUS | Qty: 12 | Status: AC

## 2022-05-17 ENCOUNTER — Inpatient Hospital Stay: Payer: Self-pay

## 2022-05-17 VITALS — BP 107/65 | HR 82 | Temp 98.1°F | Resp 18 | Ht 61.0 in | Wt 148.0 lb

## 2022-05-17 DIAGNOSIS — C50412 Malignant neoplasm of upper-outer quadrant of left female breast: Secondary | ICD-10-CM

## 2022-05-17 LAB — URINE CULTURE: Culture: 60000 — AB

## 2022-05-17 MED ORDER — ACETAMINOPHEN 325 MG PO TABS
650.0000 mg | ORAL_TABLET | Freq: Once | ORAL | Status: AC
  Administered 2022-05-17: 650 mg via ORAL
  Filled 2022-05-17: qty 2

## 2022-05-17 MED ORDER — SODIUM CHLORIDE 0.9 % IV SOLN
10.0000 mg | Freq: Once | INTRAVENOUS | Status: AC
  Administered 2022-05-17: 10 mg via INTRAVENOUS
  Filled 2022-05-17: qty 10

## 2022-05-17 MED ORDER — FAM-TRASTUZUMAB DERUXTECAN-NXKI CHEMO 100 MG IV SOLR
3.4000 mg/kg | Freq: Once | INTRAVENOUS | Status: AC
  Administered 2022-05-17: 240 mg via INTRAVENOUS
  Filled 2022-05-17: qty 12

## 2022-05-17 MED ORDER — DEXTROSE 5 % IV SOLN: Freq: Once | INTRAVENOUS | Status: AC

## 2022-05-17 MED ORDER — DIPHENHYDRAMINE HCL 25 MG PO CAPS
50.0000 mg | ORAL_CAPSULE | Freq: Once | ORAL | Status: AC
  Administered 2022-05-17: 50 mg via ORAL
  Filled 2022-05-17: qty 2

## 2022-05-17 MED ORDER — PALONOSETRON HCL INJECTION 0.25 MG/5ML
0.2500 mg | Freq: Once | INTRAVENOUS | Status: AC
  Administered 2022-05-17: 0.25 mg via INTRAVENOUS
  Filled 2022-05-17: qty 5

## 2022-05-17 MED ORDER — SODIUM CHLORIDE 0.9% FLUSH
10.0000 mL | INTRAVENOUS | Status: DC | PRN
  Administered 2022-05-17: 10 mL

## 2022-05-17 MED ORDER — HEPARIN SOD (PORK) LOCK FLUSH 100 UNIT/ML IV SOLN
500.0000 [IU] | Freq: Once | INTRAVENOUS | Status: AC | PRN
  Administered 2022-05-17: 500 [IU]

## 2022-05-17 NOTE — Patient Instructions (Signed)
Fam-Trastuzumab Deruxtecan Injection What is this medication? FAM-TRASTUZUMAB DERUXTECAN (fam-tras TOOZ eu mab DER ux TEE kan) treats some types of cancer. It works by blocking a protein that causes cancer cells to grow and multiply. This helps to slow or stop the spread of cancer cells. This medicine may be used for other purposes; ask your health care provider or pharmacist if you have questions. COMMON BRAND NAME(S): ENHERTU What should I tell my care team before I take this medication? They need to know if you have any of these conditions: Heart disease Heart failure Infection, especially a viral infection, such as chickenpox, cold sores, or herpes Liver disease Lung or breathing disease, such as asthma or COPD An unusual or allergic reaction to fam-trastuzumab deruxtecan, other medications, foods, dyes, or preservatives Pregnant or trying to get pregnant Breast-feeding How should I use this medication? This medication is injected into a vein. It is given by your care team in a hospital or clinic setting. A special MedGuide will be given to you before each treatment. Be sure to read this information carefully each time. Talk to your care team about the use of this medication in children. Special care may be needed. Overdosage: If you think you have taken too much of this medicine contact a poison control center or emergency room at once. NOTE: This medicine is only for you. Do not share this medicine with others. What if I miss a dose? It is important not to miss your dose. Call your care team if you are unable to keep an appointment. What may interact with this medication? Interactions are not expected. This list may not describe all possible interactions. Give your health care provider a list of all the medicines, herbs, non-prescription drugs, or dietary supplements you use. Also tell them if you smoke, drink alcohol, or use illegal drugs. Some items may interact with your  medicine. What should I watch for while using this medication? Visit your care team for regular checks on your progress. Tell your care team if your symptoms do not start to get better or if they get worse. Your condition will be monitored carefully while you are receiving this medication. Do not become pregnant while taking this medication or for 7 months after stopping it. Women should inform their care team if they wish to become pregnant or think they might be pregnant. Men should not father a child while taking this medication and for 4 months after stopping it. There is potential for serious side effects to an unborn child. Talk to your care team for more information. Do not breast-feed an infant while taking this medication or for 7 months after the last dose. This medication has caused decreased sperm counts in some men. This may make it more difficult to father a child. Talk to your care team if you are concerned about your fertility. This medication may increase your risk to bruise or bleed. Call your care team if you notice any unusual bleeding. Be careful brushing or flossing your teeth or using a toothpick because you may get an infection or bleed more easily. If you have any dental work done, tell your dentist you are receiving this medication. This medication may cause dry eyes and blurred vision. If you wear contact lenses, you may feel some discomfort. Lubricating eye drops may help. See your care team if the problem does not go away or is severe. This medication may increase your risk of getting an infection. Call your care team for  advice if you get a fever, chills, sore throat, or other symptoms of a cold or flu. Do not treat yourself. Try to avoid being around people who are sick. Avoid taking medications that contain aspirin, acetaminophen, ibuprofen, naproxen, or ketoprofen unless instructed by your care team. These medications may hide a fever. What side effects may I notice from  receiving this medication? Side effects that you should report to your care team as soon as possible: Allergic reactions--skin rash, itching, hives, swelling of the face, lips, tongue, or throat Dry cough, shortness of breath or trouble breathing Infection--fever, chills, cough, sore throat, wounds that don't heal, pain or trouble when passing urine, general feeling of discomfort or being unwell Heart failure--shortness of breath, swelling of the ankles, feet, or hands, sudden weight gain, unusual weakness or fatigue Unusual bruising or bleeding Side effects that usually do not require medical attention (report these to your care team if they continue or are bothersome): Constipation Diarrhea Hair loss Muscle pain Nausea Vomiting This list may not describe all possible side effects. Call your doctor for medical advice about side effects. You may report side effects to FDA at 1-800-FDA-1088. Where should I keep my medication? This medication is given in a hospital or clinic. It will not be stored at home. NOTE: This sheet is a summary. It may not cover all possible information. If you have questions about this medicine, talk to your doctor, pharmacist, or health care provider.  2023 Elsevier/Gold Standard (2021-08-01 00:00:00)

## 2022-05-21 ENCOUNTER — Other Ambulatory Visit: Payer: Self-pay

## 2022-05-23 ENCOUNTER — Telehealth: Payer: Self-pay

## 2022-05-23 NOTE — Telephone Encounter (Signed)
-----   Message from Melodye Ped, NP sent at 05/23/2022 11:54 AM EDT ----- Regarding: RE: Vomiting and chills She is on treatment and for sure needs ED evaluation for fever workup.  ----- Message ----- From: Georgette Shell, RN Sent: 05/23/2022  11:47 AM EDT To: Melodye Ped, NP Subject: Vomiting and chills                            Daughter called stating patient can't stop vomiting and has chills.

## 2022-05-23 NOTE — Telephone Encounter (Signed)
Spoke with daughter and gave her Almira Bar., NP recommendation to go to ED.

## 2022-05-24 ENCOUNTER — Telehealth: Payer: Self-pay | Admitting: Oncology

## 2022-05-24 NOTE — Telephone Encounter (Signed)
05/24/22 spoke with daughter(Maria)and scheduled ct scans and mri scan.

## 2022-05-29 ENCOUNTER — Other Ambulatory Visit: Payer: Self-pay

## 2022-05-31 NOTE — Progress Notes (Signed)
Sent in request for DOS 06/04/2022, 06/05/2022, and 06/07/2022 to [employer].

## 2022-06-04 ENCOUNTER — Other Ambulatory Visit: Payer: Self-pay | Admitting: Oncology

## 2022-06-04 ENCOUNTER — Encounter: Payer: Self-pay | Admitting: Oncology

## 2022-06-05 ENCOUNTER — Other Ambulatory Visit: Payer: Self-pay

## 2022-06-05 ENCOUNTER — Encounter: Payer: Self-pay | Admitting: Hematology and Oncology

## 2022-06-05 ENCOUNTER — Encounter: Payer: Self-pay | Admitting: Oncology

## 2022-06-05 ENCOUNTER — Inpatient Hospital Stay: Payer: Self-pay | Attending: Oncology | Admitting: Hematology and Oncology

## 2022-06-05 DIAGNOSIS — C7931 Secondary malignant neoplasm of brain: Secondary | ICD-10-CM | POA: Insufficient documentation

## 2022-06-05 DIAGNOSIS — C50412 Malignant neoplasm of upper-outer quadrant of left female breast: Secondary | ICD-10-CM | POA: Insufficient documentation

## 2022-06-05 DIAGNOSIS — Z79899 Other long term (current) drug therapy: Secondary | ICD-10-CM | POA: Insufficient documentation

## 2022-06-05 DIAGNOSIS — Z171 Estrogen receptor negative status [ER-]: Secondary | ICD-10-CM | POA: Insufficient documentation

## 2022-06-05 DIAGNOSIS — Z5112 Encounter for antineoplastic immunotherapy: Secondary | ICD-10-CM | POA: Insufficient documentation

## 2022-06-05 LAB — BASIC METABOLIC PANEL
BUN: 11 (ref 4–21)
CO2: 23 — AB (ref 13–22)
Chloride: 107 (ref 99–108)
Creatinine: 0.6 (ref 0.5–1.1)
Glucose: 92
Potassium: 3.7 mEq/L (ref 3.5–5.1)
Sodium: 139 (ref 137–147)

## 2022-06-05 LAB — CBC AND DIFFERENTIAL
HCT: 26 — AB (ref 36–46)
Hemoglobin: 8.6 — AB (ref 12.0–16.0)
Neutrophils Absolute: 2.86
Platelets: 449 10*3/uL — AB (ref 150–400)
WBC: 4.2

## 2022-06-05 LAB — CBC: RBC: 3.14 — AB (ref 3.87–5.11)

## 2022-06-05 LAB — HEPATIC FUNCTION PANEL
ALT: 24 U/L (ref 7–35)
AST: 37 — AB (ref 13–35)
Alkaline Phosphatase: 121 (ref 25–125)
Bilirubin, Total: 0.4

## 2022-06-05 LAB — COMPREHENSIVE METABOLIC PANEL
Albumin: 3.6 (ref 3.5–5.0)
Calcium: 9.2 (ref 8.7–10.7)

## 2022-06-05 NOTE — Progress Notes (Cosign Needed)
Patient Care Team: Marguerita Merles, MD as PCP - General (Family Medicine) Marice Potter, MD as PCP - Hematology/Oncology (Oncology)  Clinic Day:  06/05/2022  Referring physician: Marguerita Merles, MD  ASSESSMENT & PLAN:   Assessment & Plan: Malignant neoplasm of upper-outer quadrant of left female breast [facility]) A 71 y.o. 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  She also has had subcarinal nodal disease that has resolved over time with therapy. Recent MRI of the brain and CT imaging of the chest/ abdomen and pelvis reveal no new metastatic disease. CBC and CMP are unremarkable. She is tolerating treatment well. We discussed imaging in detail. She will proceed with cycle 21 this week and return to clinic in 3 weeks for repeat evaluation.      The patient understands the plans discussed today and is in agreement with them.  She knows to contact our office if she develops concerns prior to her next appointment.    Melodye Ped, NP  [facility] [address] [place] [address] Dept: [phone] Dept Fax: [phone]   Orders Placed This Encounter  Procedures   CBC and differential    This external order was created through the Results Console.   CBC    This external order was created through the Results Console.   Basic metabolic panel    This external order was created through the Results Console.   Comprehensive metabolic panel    This external order was created through the Results Console.   Hepatic function panel    This external order was created through the Results Console.      CHIEF COMPLAINT:  CC: A 71 year old female with breast cancer here for 3 week evaluation  Current Treatment:  Enhertu  INTERVAL HISTORY:  Elvie is here today for repeat clinical assessment. She denies fevers or chills. She denies  pain. Her appetite is good. Her weight has been stable.  I have reviewed the past medical history, past surgical history, social history and family history with the patient and they are unchanged from previous note.  ALLERGIES:  has No Known Allergies.  MEDICATIONS:  Current Outpatient Medications  Medication Sig Dispense Refill   acetaminophen (TYLENOL) 325 MG tablet Take 2 tablets (650 mg total) by mouth every 4 (four) hours as needed for mild pain (temp > 100.5).     B Complex-C (B-COMPLEX WITH VITAMIN C) tablet Take 1 tablet by mouth daily after lunch. 30 tablet 0   Cholecalciferol (VITAMIN D3 PO) Take 1 tablet by mouth daily after lunch.     dexamethasone (DECADRON) 4 MG tablet Take 1 tablet (4 mg total) by mouth daily. (Patient not taking: Reported on 06/05/2022) 3 tablet 0   docusate sodium (COLACE) 100 MG capsule Take 1 capsule (100 mg total) by mouth 2 (two) times daily. 10 capsule 0   HYDROcodone-acetaminophen (NORCO/VICODIN) 5-325 MG tablet Take 1 tablet by mouth every 8 (eight) hours as needed for moderate pain. 30 tablet 0   magnesium oxide (MAG-OX) 400 MG tablet Take 1 tablet (400 mg total) by mouth daily after lunch. 30 tablet 0   meclizine (ANTIVERT) 12.5 MG tablet Take 1 tablet (12.5 mg total) by mouth 2 (two) times daily as needed for dizziness. (Patient not taking: Reported on 06/05/2022) 30 tablet 0   Omega-3 Fatty Acids (OMEGA 3 PO) Take 2 capsules by mouth  daily after lunch.     ondansetron (ZOFRAN) 4 MG tablet Take 1 tablet (4 mg total) by mouth every 4 (four) hours as needed for nausea. 90 tablet 3   pantoprazole (PROTONIX) 40 MG tablet Take 1 tablet (40 mg total) by mouth at bedtime. 30 tablet 0   polyethylene glycol (MIRALAX / GLYCOLAX) 17 g packet Take 17 g by mouth 2 (two) times daily as needed for moderate constipation or severe constipation. (Patient not taking: Reported on 12/27/2021) 14 each 0   Potassium 99 MG TABS Take 1 tablet (99 mg total) by mouth daily after  lunch. (Patient not taking: Reported on 06/05/2022) 20 tablet 0   prochlorperazine (COMPAZINE) 10 MG tablet Take 1 tablet (10 mg total) by mouth every 6 (six) hours as needed for nausea or vomiting. 90 tablet 3   senna (SENOKOT) 8.6 MG TABS tablet Take 2 tablets (17.2 mg total) by mouth at bedtime. 120 tablet 0   No current facility-administered medications for this visit.    HISTORY OF PRESENT ILLNESS:   Oncology History  Malignant neoplasm of upper-outer quadrant of left female breast ([place])  07/15/2020 Initial Diagnosis   Malignant neoplasm of upper-outer quadrant of left female breast ([place])   03/21/2021 -  Chemotherapy   Patient is on Treatment Plan : BREAST METASTATIC fam-trastuzumab deruxtecan-nxki (Enhertu) q21d         REVIEW OF SYSTEMS:   Constitutional: Denies fevers, chills or abnormal weight loss Eyes: Denies blurriness of vision Ears, nose, mouth, throat, and face: Denies mucositis or sore throat Respiratory: Denies cough, dyspnea or wheezes Cardiovascular: Denies palpitation, chest discomfort or lower extremity swelling Gastrointestinal:  Denies nausea, heartburn or change in bowel habits Skin: Denies abnormal skin rashes Lymphatics: Denies new lymphadenopathy or easy bruising Neurological:Denies numbness, tingling or new weaknesses Behavioral/Psych: Mood is stable, no new changes  All other systems were reviewed with the patient and are negative.   VITALS:  Blood pressure 100/64, pulse 86, temperature (!) 97.5 F (36.4 C), temperature source Oral, resp. rate 20, height 5' 1" (1.549 m), weight 145 lb 6.4 oz (66 kg), SpO2 97 %.  Wt Readings from Last 3 Encounters:  06/05/22 145 lb 6.4 oz (66 kg)  05/17/22 148 lb (67.1 kg)  05/15/22 151 lb 14.4 oz (68.9 kg)    Body mass index is 27.47 kg/m.  Performance status (ECOG): 1 - Symptomatic but completely ambulatory  PHYSICAL EXAM:   GENERAL:alert, no distress and comfortable SKIN: skin color, texture, turgor are  normal, no rashes or significant lesions EYES: normal, Conjunctiva are pink and non-injected, sclera clear OROPHARYNX:no exudate, no erythema and lips, buccal mucosa, and tongue normal  NECK: supple, thyroid normal size, non-tender, without nodularity LYMPH:  no palpable lymphadenopathy in the cervical, axillary or inguinal LUNGS: clear to auscultation and percussion with normal breathing effort HEART: regular rate & rhythm and no murmurs and no lower extremity edema ABDOMEN:abdomen soft, non-tender and normal bowel sounds Musculoskeletal:no cyanosis of digits and no clubbing  NEURO: alert & oriented x 3 with fluent speech, no focal motor/sensory deficits  LABORATORY DATA:  I have reviewed the data as listed    Component Value Date/Time   NA 139 06/05/2022 0000   K 3.7 06/05/2022 0000   CL 107 06/05/2022 0000   CO2 23 (A) 06/05/2022 0000   GLUCOSE 109 (H) 02/16/2021 1122   BUN 11 06/05/2022 0000   CREATININE 0.6 06/05/2022 0000   CREATININE 0.70 02/16/2021 1122   CALCIUM 9.2 06/05/2022 0000  PROT 7.2 02/13/2021 0457   ALBUMIN 3.6 06/05/2022 0000   AST 37 (A) 06/05/2022 0000   ALT 24 06/05/2022 0000   ALKPHOS 121 06/05/2022 0000   BILITOT 0.3 02/13/2021 0457   GFRNONAA >60 02/16/2021 1122    No results found for: "SPEP", "UPEP"  Lab Results  Component Value Date   WBC 4.2 06/05/2022   NEUTROABS 2.86 06/05/2022   HGB 8.6 (A) 06/05/2022   HCT 26 (A) 06/05/2022   MCV 96 11/15/2021   PLT 449 (A) 06/05/2022      Chemistry      Component Value Date/Time   NA 139 06/05/2022 0000   K 3.7 06/05/2022 0000   CL 107 06/05/2022 0000   CO2 23 (A) 06/05/2022 0000   BUN 11 06/05/2022 0000   CREATININE 0.6 06/05/2022 0000   CREATININE 0.70 02/16/2021 1122   GLU 92 06/05/2022 0000      Component Value Date/Time   CALCIUM 9.2 06/05/2022 0000   ALKPHOS 121 06/05/2022 0000   AST 37 (A) 06/05/2022 0000   ALT 24 06/05/2022 0000   BILITOT 0.3 02/13/2021 0457        RADIOGRAPHIC STUDIES: I have personally reviewed the radiological images as listed and agreed with the findings in the report. No results found.

## 2022-06-05 NOTE — Assessment & Plan Note (Signed)
A 71 y.o. 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 She also has had subcarinal nodal disease that has resolved over time with therapy. Recent MRI of the brain and CT imaging of the chest/ abdomen and pelvis reveal no new metastatic disease. CBC and CMP are unremarkable. She is tolerating treatment well. We discussed imaging in detail. She will proceed with cycle 21 this week and return to clinic in 3 weeks for repeat evaluation.

## 2022-06-06 ENCOUNTER — Other Ambulatory Visit: Payer: Self-pay

## 2022-06-06 MED FILL — Dexamethasone Sodium Phosphate Inj 100 MG/10ML: INTRAMUSCULAR | Qty: 1 | Status: AC

## 2022-06-06 MED FILL — Fam-Trastuzumab Deruxtecan-nxki For IV Soln 100 MG: INTRAVENOUS | Qty: 12 | Status: AC

## 2022-06-07 ENCOUNTER — Inpatient Hospital Stay: Payer: Self-pay

## 2022-06-07 VITALS — BP 122/74 | HR 87 | Temp 98.2°F | Resp 18 | Ht 61.0 in | Wt 146.0 lb

## 2022-06-07 DIAGNOSIS — C50412 Malignant neoplasm of upper-outer quadrant of left female breast: Secondary | ICD-10-CM

## 2022-06-07 MED ORDER — SODIUM CHLORIDE 0.9 % IV SOLN
10.0000 mg | Freq: Once | INTRAVENOUS | Status: AC
  Administered 2022-06-07: 10 mg via INTRAVENOUS
  Filled 2022-06-07: qty 10

## 2022-06-07 MED ORDER — HEPARIN SOD (PORK) LOCK FLUSH 100 UNIT/ML IV SOLN
500.0000 [IU] | Freq: Once | INTRAVENOUS | Status: AC | PRN
  Administered 2022-06-07: 500 [IU]

## 2022-06-07 MED ORDER — DIPHENHYDRAMINE HCL 25 MG PO CAPS
50.0000 mg | ORAL_CAPSULE | Freq: Once | ORAL | Status: AC
  Administered 2022-06-07: 50 mg via ORAL

## 2022-06-07 MED ORDER — FAM-TRASTUZUMAB DERUXTECAN-NXKI CHEMO 100 MG IV SOLR
3.4000 mg/kg | Freq: Once | INTRAVENOUS | Status: AC
  Administered 2022-06-07: 240 mg via INTRAVENOUS
  Filled 2022-06-07: qty 12

## 2022-06-07 MED ORDER — ACETAMINOPHEN 325 MG PO TABS
650.0000 mg | ORAL_TABLET | Freq: Once | ORAL | Status: AC
  Administered 2022-06-07: 650 mg via ORAL

## 2022-06-07 MED ORDER — DEXTROSE 5 % IV SOLN: Freq: Once | INTRAVENOUS | Status: AC

## 2022-06-07 MED ORDER — SODIUM CHLORIDE 0.9% FLUSH
10.0000 mL | INTRAVENOUS | Status: DC | PRN
  Administered 2022-06-07: 10 mL

## 2022-06-07 MED ORDER — PALONOSETRON HCL INJECTION 0.25 MG/5ML
0.2500 mg | Freq: Once | INTRAVENOUS | Status: AC
  Administered 2022-06-07: 0.25 mg via INTRAVENOUS

## 2022-06-07 NOTE — Patient Instructions (Signed)
Fam-Trastuzumab Deruxtecan Injection What is this medication? FAM-TRASTUZUMAB DERUXTECAN (fam-tras TOOZ eu mab DER ux TEE kan) treats some types of cancer. It works by blocking a protein that causes cancer cells to grow and multiply. This helps to slow or stop the spread of cancer cells. This medicine may be used for other purposes; ask your health care provider or pharmacist if you have questions. COMMON BRAND NAME(S): ENHERTU What should I tell my care team before I take this medication? They need to know if you have any of these conditions: Heart disease Heart failure Infection, especially a viral infection, such as chickenpox, cold sores, or herpes Liver disease Lung or breathing disease, such as asthma or COPD An unusual or allergic reaction to fam-trastuzumab deruxtecan, other medications, foods, dyes, or preservatives Pregnant or trying to get pregnant Breast-feeding How should I use this medication? This medication is injected into a vein. It is given by your care team in a hospital or clinic setting. A special MedGuide will be given to you before each treatment. Be sure to read this information carefully each time. Talk to your care team about the use of this medication in children. Special care may be needed. Overdosage: If you think you have taken too much of this medicine contact a poison control center or emergency room at once. NOTE: This medicine is only for you. Do not share this medicine with others. What if I miss a dose? It is important not to miss your dose. Call your care team if you are unable to keep an appointment. What may interact with this medication? Interactions are not expected. This list may not describe all possible interactions. Give your health care provider a list of all the medicines, herbs, non-prescription drugs, or dietary supplements you use. Also tell them if you smoke, drink alcohol, or use illegal drugs. Some items may interact with your  medicine. What should I watch for while using this medication? Visit your care team for regular checks on your progress. Tell your care team if your symptoms do not start to get better or if they get worse. Your condition will be monitored carefully while you are receiving this medication. Do not become pregnant while taking this medication or for 7 months after stopping it. Women should inform their care team if they wish to become pregnant or think they might be pregnant. Men should not father a child while taking this medication and for 4 months after stopping it. There is potential for serious side effects to an unborn child. Talk to your care team for more information. Do not breast-feed an infant while taking this medication or for 7 months after the last dose. This medication has caused decreased sperm counts in some men. This may make it more difficult to father a child. Talk to your care team if you are concerned about your fertility. This medication may increase your risk to bruise or bleed. Call your care team if you notice any unusual bleeding. Be careful brushing or flossing your teeth or using a toothpick because you may get an infection or bleed more easily. If you have any dental work done, tell your dentist you are receiving this medication. This medication may cause dry eyes and blurred vision. If you wear contact lenses, you may feel some discomfort. Lubricating eye drops may help. See your care team if the problem does not go away or is severe. This medication may increase your risk of getting an infection. Call your care team for  advice if you get a fever, chills, sore throat, or other symptoms of a cold or flu. Do not treat yourself. Try to avoid being around people who are sick. Avoid taking medications that contain aspirin, acetaminophen, ibuprofen, naproxen, or ketoprofen unless instructed by your care team. These medications may hide a fever. What side effects may I notice from  receiving this medication? Side effects that you should report to your care team as soon as possible: Allergic reactions--skin rash, itching, hives, swelling of the face, lips, tongue, or throat Dry cough, shortness of breath or trouble breathing Infection--fever, chills, cough, sore throat, wounds that don't heal, pain or trouble when passing urine, general feeling of discomfort or being unwell Heart failure--shortness of breath, swelling of the ankles, feet, or hands, sudden weight gain, unusual weakness or fatigue Unusual bruising or bleeding Side effects that usually do not require medical attention (report these to your care team if they continue or are bothersome): Constipation Diarrhea Hair loss Muscle pain Nausea Vomiting This list may not describe all possible side effects. Call your doctor for medical advice about side effects. You may report side effects to FDA at 1-800-FDA-1088. Where should I keep my medication? This medication is given in a hospital or clinic. It will not be stored at home. NOTE: This sheet is a summary. It may not cover all possible information. If you have questions about this medicine, talk to your doctor, pharmacist, or health care provider.  2023 Elsevier/Gold Standard (2021-06-28 00:00:00)

## 2022-06-13 IMAGING — MR MR HEAD WO/W CM
7 of 13 series · 21 of 48 positions shown · IV contrast (gadavist)
Comparison: Prior MRI from 01/13/2021.

CLINICAL DATA: Follow-up examination status post tumor resection.

EXAM:
MRI HEAD WITHOUT AND WITH CONTRAST
TECHNIQUE: Multiplanar, multiecho pulse sequences of the brain and surrounding
structures were obtained without and with intravenous contrast.
CONTRAST:  7mL GADAVIST GADOBUTROL 1 MMOL/ML IV SOLN

[Series 2: DWI · axial · 3.0mm · 0.94mm/px · z∈[-142,-9]mm · 6 of 100 slices shown (1 of 2)]
[im 1/100]
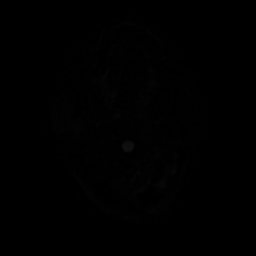
[im 20/100]
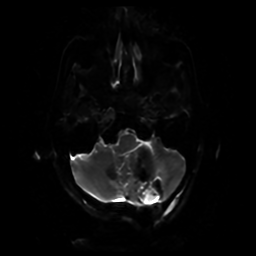
[im 40/100]
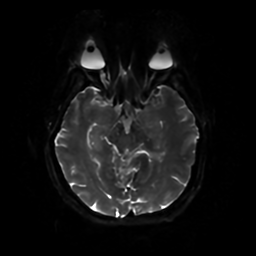
[im 60/100]
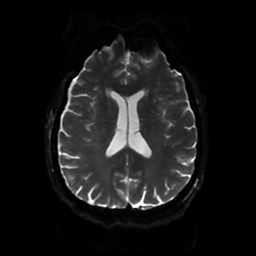
[im 80/100]
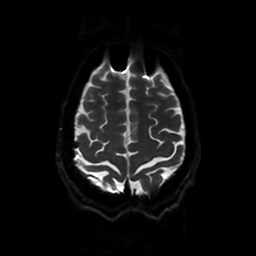
[im 100/100]
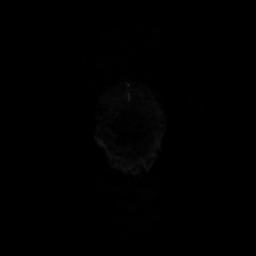

[Series 3: DWI · coronal · 4.0mm · 0.94mm/px · 5 of 72 slices shown (2 of 2)]
[im 1/72]
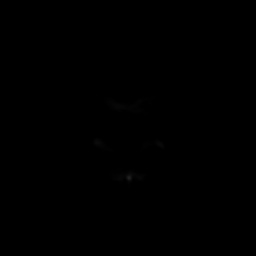
[im 18/72]
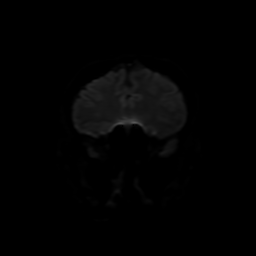
[im 36/72]
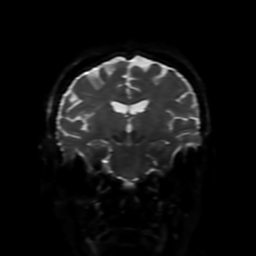
[im 54/72]
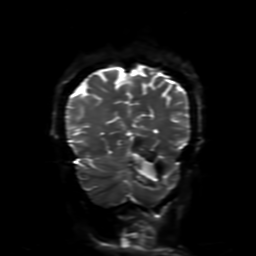
[im 72/72]
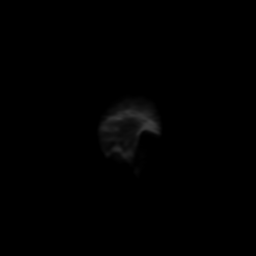

[Series 4: FLAIR · sagittal · 5.0mm · 0.23mm/px · 2 of 25 slices shown (1 of 2)]
[im 1/25]
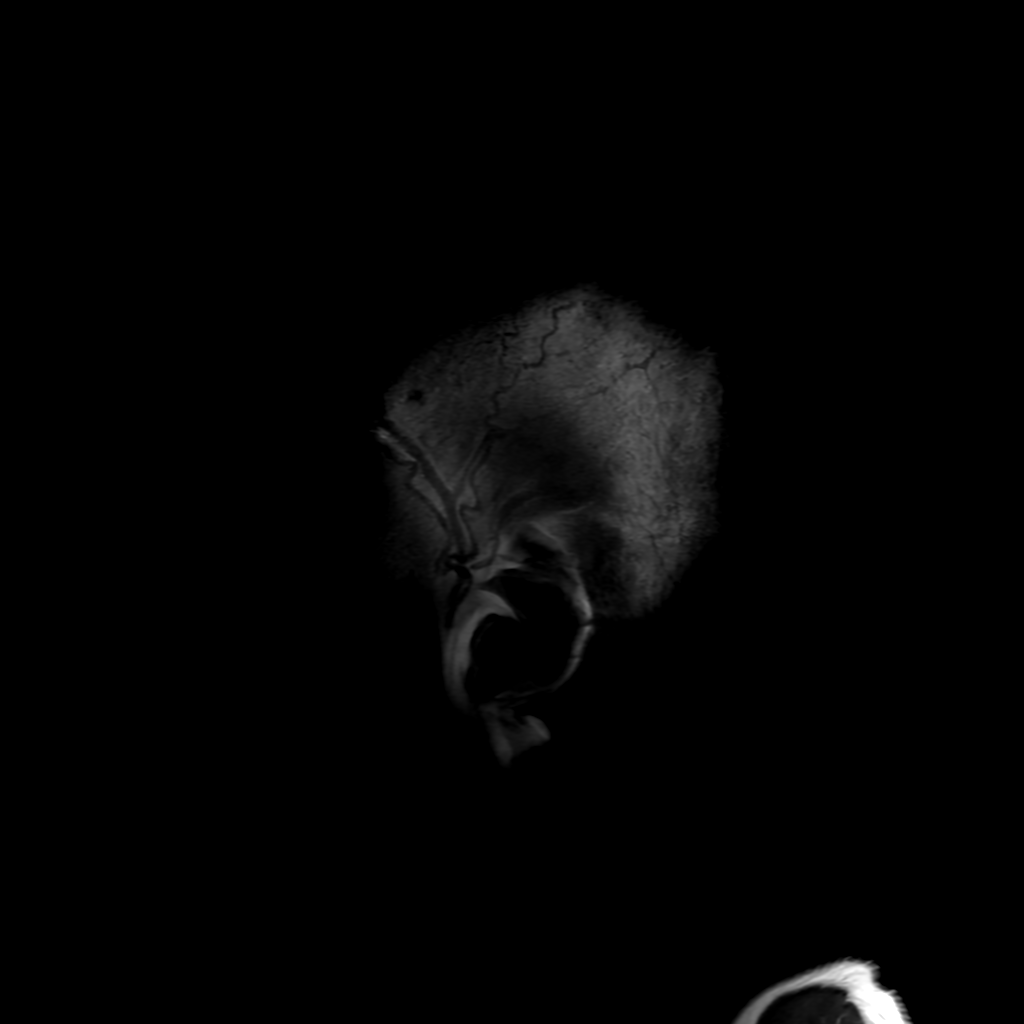
[im 25/25]
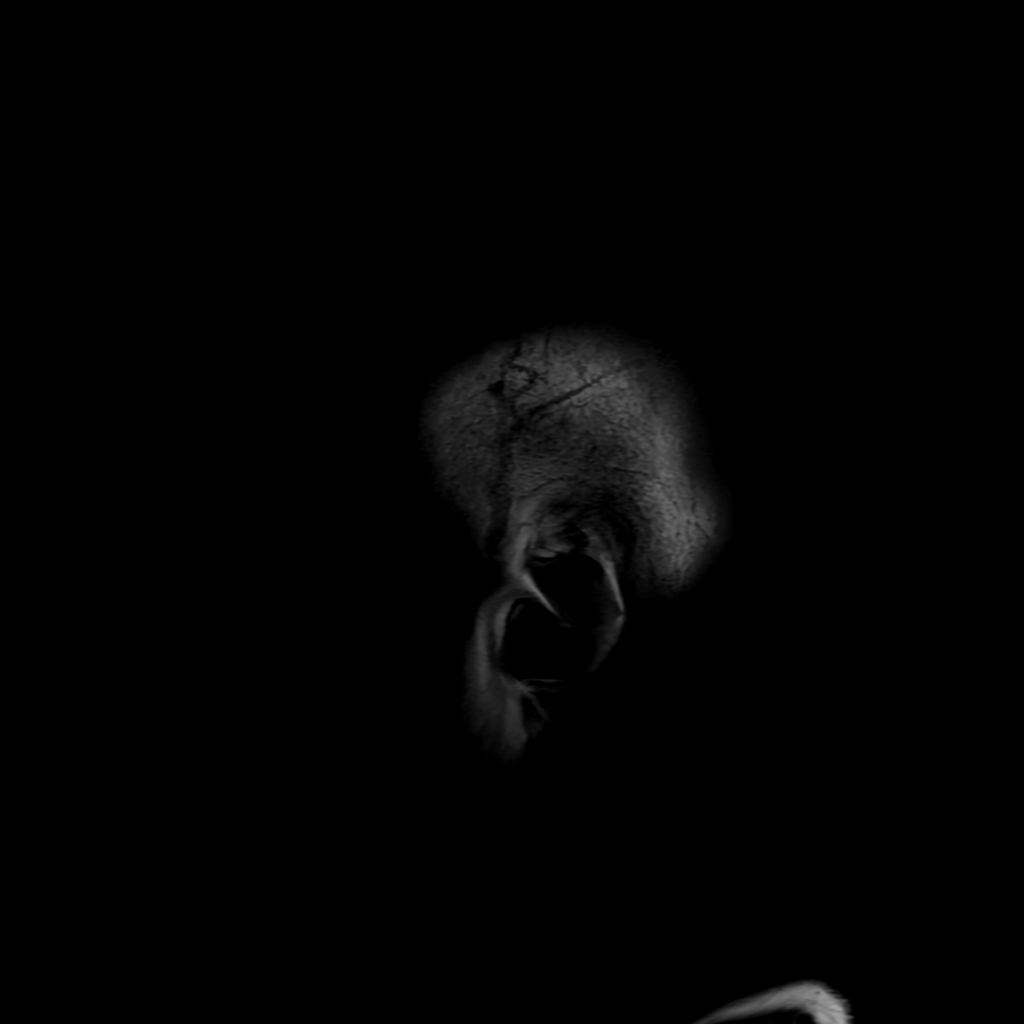

[Series 5: T2 · axial · 5.0mm · 0.23mm/px · 1 of 26 slices shown]
[im 1/26]
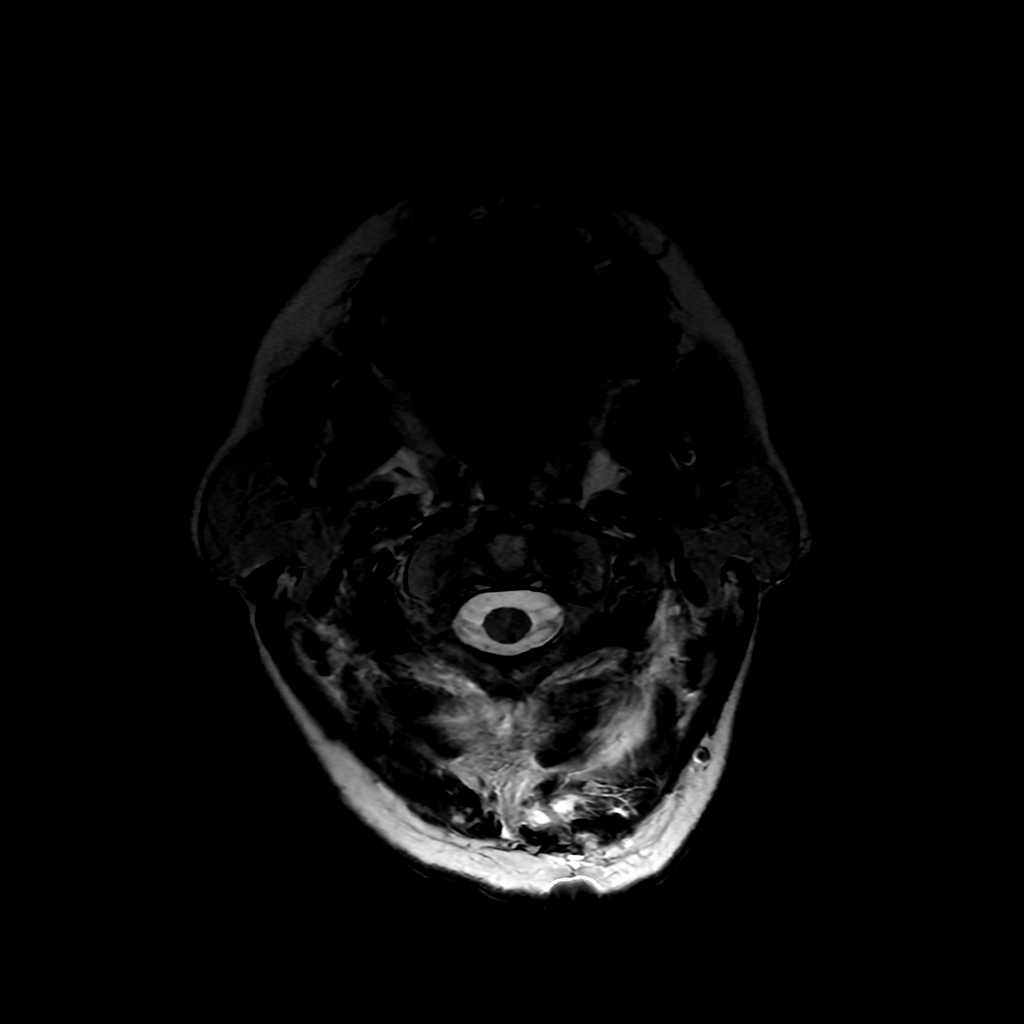

[Series 6: FLAIR · axial · 3.0mm · 0.45mm/px · z∈[-126,+11]mm · 2 of 25 slices shown (2 of 2)]
[im 1/25]
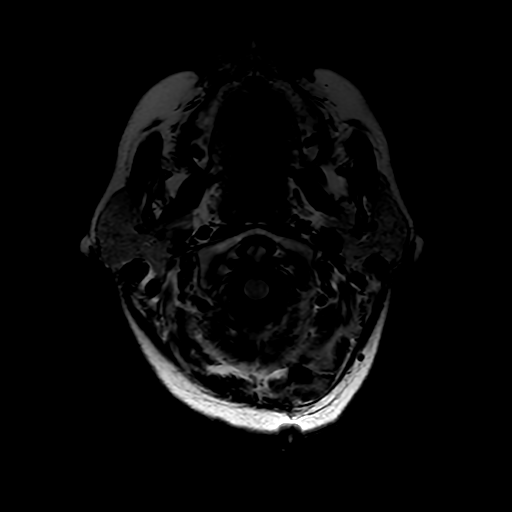
[im 25/25]
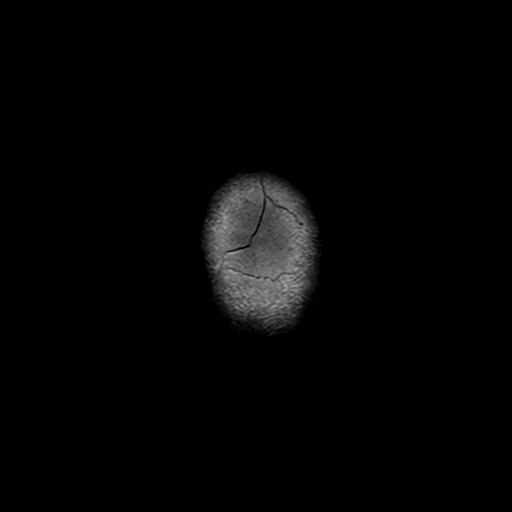

[Series 250: ADC · axial · 3.0mm · 0.94mm/px · z∈[-142,-9]mm · 3 of 45 slices shown (1 of 2)]
[im 1/45]
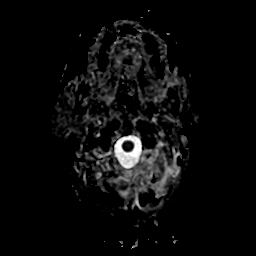
[im 23/45]
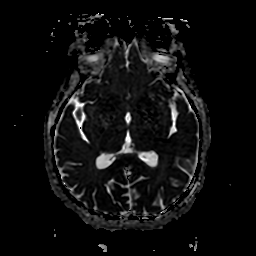
[im 45/45]
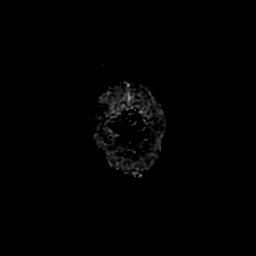

[Series 350: ADC · coronal · 4.0mm · 0.94mm/px · 2 of 35 slices shown (2 of 2)]
[im 1/35]
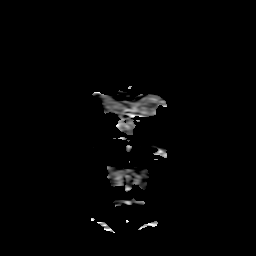
[im 35/35]
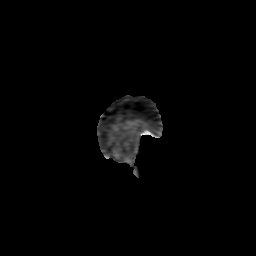

[21 of 48 positions shown; findings below may reference images not displayed]

FINDINGS: Brain: Postoperative changes from interval left suboccipital
craniotomy for tumor resection are seen. Postoperative blood
products seen throughout the resection cavity at the left
cerebellum, with superimposed air-fluid level. The previously seen
cystic tumor has largely been resected. Irregular serpiginous and
patchy enhancement about the resection cavity favored to in large
part be postoperative in nature. No definite residual tumor on this
immediate postcontrast examination. Residual vasogenic edema and
mass effect within the left cerebellar hemisphere is relatively
similar to preoperative exam. No infarct or other complication seen
about the resection cavity. Postoperative pneumocephalus noted
overlying the anterior frontal convexities as well.

Remainder of the brain is otherwise stable. Mild cerebral white
matter changes involving the supratentorial cerebral white matter
noted, nonspecific, but most like related chronic microvascular
ischemic disease. No evidence for acute infarct. No other acute
intracranial hemorrhage. No other mass lesion or abnormal
enhancement. No hydrocephalus or extra-axial collection.

Vascular: Major intracranial vascular flow voids remain patent and
well preserved. Normal flow voids seen within the major dural
sinuses.

Skull and upper cervical spine: Craniocervical junction within
normal limits. Bone marrow signal intensity normal. Sequelae of
interval left suboccipital craniotomy. Calvarium and scalp soft
tissues otherwise unremarkable.

Sinuses/Orbits: Globes and orbital soft tissues within normal
limits. Paranasal sinuses remain largely clear. No significant
mastoid effusion.

Other: None.
IMPRESSION: 1. Postoperative changes from interval left suboccipital craniotomy
for tumor resection without complication or adverse features. Gross
total resection of the left cerebellar mass, without definite
residual tumor on this immediate postoperative examination. Residual
vasogenic edema and mass effect on the adjacent fourth ventricle
without hydrocephalus, similar to previous.
2. No other new acute intracranial abnormality.

## 2022-06-21 NOTE — Progress Notes (Signed)
Sent in request for DOS 06/26/2022 and 06/28/2022 to [employer].

## 2022-06-22 ENCOUNTER — Other Ambulatory Visit: Payer: Self-pay | Admitting: Pharmacist

## 2022-06-22 ENCOUNTER — Telehealth: Payer: Self-pay

## 2022-06-22 NOTE — Telephone Encounter (Addendum)
Susan,RPH: ok thanks, I will adjust her plan.   Dr Felecia Jan P,RPH, Penelope Galas, RN and Verner Chol for Dr Bobby Rumpf all notified that pt will be out of town on dates listed.

## 2022-06-25 NOTE — Progress Notes (Signed)
[name]  [address] [place],    [address] [phone]  Clinic Day:  06/26/2022  Referring physician: Marguerita Merles, MD  HISTORY OF PRESENT ILLNESS:  The patient is a 71 y.o. 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  She also had a subcarinal lymph node for which scans have shown a positive response to therapy. She comes in today to be evaluated before heading into her 22nd cycle of Enhertu. The patient claims that she tolerated her 21st cycle of treatment fairly well.  The only problem she went to recently is a urinary tract infection, which since been treated.  Of note, recent scans showed no evidence of disease progression.  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  The patient received 6 cycles of Taxotere/Herceptin/Perjeta contained.  She was then before being switched to maintenance Perjeta/Herceptin, for which she took 73 cycles of treatment.  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  She had disease recurrence in early 2022, which manifested itself as a left suboccipital and subcarinal nodal metastasis.  This all happened in the setting of a prolonged treatment break from TDM-1.  Since recurrence was found, the patient has been on Enhertu, which began in May 2022.  VITALS:  Blood pressure 114/64, pulse 68, temperature 98.5 F (36.9 C), resp. rate 14, height 5' 1" (1.549 m), weight 146 lb 1.6 oz (66.3 kg), SpO2 98 %.  Wt Readings from Last 3 Encounters:  06/26/22 146 lb 1.6 oz (66.3 kg)  06/07/22 146 lb (66.2 kg)  06/05/22 145 lb 6.4 oz (66 kg)    Body mass index is 27.61 kg/m.  Performance status (ECOG): 1 - Symptomatic  but completely ambulatory  PHYSICAL EXAM:  Physical Exam Constitutional:      General: She is not in acute distress.    Appearance: Normal appearance. She is normal weight.  HENT:     Head: Normocephalic and atraumatic.  Eyes:     General: No scleral icterus.    Extraocular Movements: Extraocular movements intact.     Conjunctiva/sclera: Conjunctivae normal.     Pupils: Pupils are equal, round, and reactive to light.  Cardiovascular:     Rate and Rhythm: Normal rate and regular rhythm.     Pulses: Normal pulses.     Heart sounds: Normal heart sounds. No murmur heard.    No friction rub. No gallop.  Pulmonary:     Effort: Pulmonary effort is normal. No respiratory distress.     Breath sounds: Normal breath sounds.  Abdominal:     General: Bowel sounds are normal. There is no distension.     Palpations: Abdomen is soft. There is no hepatomegaly, splenomegaly or mass.     Tenderness: There is no abdominal tenderness.  Musculoskeletal:        General: Normal range of motion.     Cervical back: Normal range of motion and neck supple.     Right lower leg: No edema.     Left lower leg: No edema.  Lymphadenopathy:     Cervical: No cervical adenopathy.  Skin:    General: Skin is warm and dry.  Neurological:     General: No focal deficit  present.     Mental Status: She is alert and oriented to person, place, and time. Mental status is at baseline.  Psychiatric:        Mood and Affect: Mood normal.        Behavior: Behavior normal.        Thought Content: Thought content normal.        Judgment: Judgment normal.   LABS:   Iron B12 folate levels pending.  ASSESSMENT & PLAN:  Assessment/Plan:  A 70 y.o. 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  She also has had subcarinal nodal disease that has resolved over time with therapy.  The patient will proceed with her 22nd cycle of Enhertu this week.   The patient understands that her recent scans show her to be disease-free.  Overall, she appears to be doing well.  When evaluating all of her labs today, her hemoglobin is much lower than what it has been previously.  Her iron and vitamin B12 levels suggest she is    Otherwise, I will see this patient back in 3 weeks before she heads into her 23rd cycle of Enhertu.  The patient understands all the plans discussed today and is in agreement with them.    Dequincy A Lewis, MD       

## 2022-06-26 ENCOUNTER — Inpatient Hospital Stay (HOSPITAL_BASED_OUTPATIENT_CLINIC_OR_DEPARTMENT_OTHER): Payer: Self-pay | Admitting: Oncology

## 2022-06-26 ENCOUNTER — Inpatient Hospital Stay: Payer: Self-pay

## 2022-06-26 ENCOUNTER — Other Ambulatory Visit: Payer: Self-pay | Admitting: Oncology

## 2022-06-26 VITALS — BP 114/64 | HR 68 | Temp 98.5°F | Resp 14 | Ht 61.0 in | Wt 146.1 lb

## 2022-06-26 DIAGNOSIS — C50912 Malignant neoplasm of unspecified site of left female breast: Secondary | ICD-10-CM

## 2022-06-26 DIAGNOSIS — D508 Other iron deficiency anemias: Secondary | ICD-10-CM

## 2022-06-26 DIAGNOSIS — C50412 Malignant neoplasm of upper-outer quadrant of left female breast: Secondary | ICD-10-CM

## 2022-06-26 DIAGNOSIS — C7931 Secondary malignant neoplasm of brain: Secondary | ICD-10-CM

## 2022-06-26 LAB — IRON AND TIBC
Iron: 22 ug/dL — ABNORMAL LOW (ref 28–170)
Saturation Ratios: 5 % — ABNORMAL LOW (ref 10.4–31.8)
TIBC: 410 ug/dL (ref 250–450)
UIBC: 388 ug/dL

## 2022-06-26 LAB — FOLATE: Folate: 26.6 ng/mL (ref >5.9)

## 2022-06-26 LAB — VITAMIN B12: Vitamin B-12: 679 pg/mL (ref 180–914)

## 2022-06-26 LAB — CBC AND DIFFERENTIAL
HCT: 28 — AB (ref 36–46)
Hemoglobin: 8.7 — AB (ref 12.0–16.0)
Neutrophils Absolute: 1.27
Platelets: 186 10*3/uL (ref 150–400)
WBC: 2.3

## 2022-06-26 LAB — COMPREHENSIVE METABOLIC PANEL
Albumin: 3.6 (ref 3.5–5.0)
Calcium: 9.3 (ref 8.7–10.7)

## 2022-06-26 LAB — FERRITIN: Ferritin: 7 ng/mL — ABNORMAL LOW (ref 11–307)

## 2022-06-27 ENCOUNTER — Encounter: Payer: Self-pay | Admitting: Oncology

## 2022-06-27 ENCOUNTER — Telehealth: Payer: Self-pay

## 2022-06-27 ENCOUNTER — Other Ambulatory Visit: Payer: Self-pay

## 2022-06-27 DIAGNOSIS — D509 Iron deficiency anemia, unspecified: Secondary | ICD-10-CM | POA: Insufficient documentation

## 2022-06-27 DIAGNOSIS — C50412 Malignant neoplasm of upper-outer quadrant of left female breast: Secondary | ICD-10-CM

## 2022-06-27 LAB — HEPATIC FUNCTION PANEL
ALT: 25 U/L (ref 7–35)
AST: 39 — AB (ref 13–35)
Alkaline Phosphatase: 98 (ref 25–125)
Bilirubin, Total: 0.5

## 2022-06-27 LAB — BASIC METABOLIC PANEL
BUN: 10 (ref 4–21)
CO2: 25 — AB (ref 13–22)
Chloride: 112 — AB (ref 99–108)
Creatinine: 0.6 (ref 0.5–1.1)
Glucose: 97
Potassium: 3.6 mEq/L (ref 3.5–5.1)
Sodium: 142 (ref 137–147)

## 2022-06-27 LAB — CBC: RBC: 3.32 — AB (ref 3.87–5.11)

## 2022-06-27 MED FILL — Dexamethasone Sodium Phosphate Inj 100 MG/10ML: INTRAMUSCULAR | Qty: 1 | Status: AC

## 2022-06-27 MED FILL — Fam-Trastuzumab Deruxtecan-nxki For IV Soln 100 MG: INTRAVENOUS | Qty: 12 | Status: AC

## 2022-06-27 NOTE — Telephone Encounter (Signed)
Pt's daughter notified of below and verbalized understanding. Pt will be [place] from 07/04/2022-07/24/2022.     Latest Reference Range & Units 06/26/22 15:20  Iron 28 - 170 ug/dL 22 (L)  UIBC ug/dL 388  TIBC 250 - 450 ug/dL 410  Saturation Ratios 10.4 - 31.8 % 5 (L)  Ferritin 11 - 307 ng/mL 7 (L)  Folate >5.9 ng/mL 26.6  Vitamin B12 180 - 914 pg/mL 679  (L): Data is abnormally low   ASSESSMENT & PLAN:  Assessment/Plan:  A 71 y.o. 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  She also has had subcarinal nodal disease that has resolved over time with therapy.  The patient will proceed with her 22nd cycle of Enhertu this week.  The patient understands that her recent scans show her to be disease-free.  Overall, she appears to be doing well.  When evaluating all of her labs today, her hemoglobin is much lower than what it has been previously.  Her iron levels clearly show that she is iron deficient again.  Based upon this, I will arrange for her to receive IV iron with her 22nd cycle of Enhertu. As her absolute neutrophil count is low, white cell shot therapy will also be given with this cycle of treatment.  Otherwise, I will see this patient back in 3 weeks before she heads into her 23rd cycle of Enhertu.  The patient understands all the plans discussed today and is in agreement with them.     [name], MD

## 2022-06-27 NOTE — Progress Notes (Signed)
OK to proceed with chemotherapy despite ANC=1270 per Dr. Bobby Rumpf.  Pt will receive zarxio injection next week.

## 2022-06-28 ENCOUNTER — Inpatient Hospital Stay: Payer: Self-pay

## 2022-06-28 VITALS — BP 127/67 | HR 69 | Temp 98.2°F | Resp 18 | Ht 61.0 in | Wt 146.0 lb

## 2022-06-28 DIAGNOSIS — Z171 Estrogen receptor negative status [ER-]: Secondary | ICD-10-CM

## 2022-06-28 MED ORDER — DEXTROSE 5 % IV SOLN: Freq: Once | INTRAVENOUS | Status: AC

## 2022-06-28 MED ORDER — PALONOSETRON HCL INJECTION 0.25 MG/5ML
0.2500 mg | Freq: Once | INTRAVENOUS | Status: AC
  Administered 2022-06-28: 0.25 mg via INTRAVENOUS
  Filled 2022-06-28: qty 5

## 2022-06-28 MED ORDER — SODIUM CHLORIDE 0.9 % IV SOLN
10.0000 mg | Freq: Once | INTRAVENOUS | Status: AC
  Administered 2022-06-28: 10 mg via INTRAVENOUS
  Filled 2022-06-28: qty 10

## 2022-06-28 MED ORDER — DIPHENHYDRAMINE HCL 25 MG PO CAPS
50.0000 mg | ORAL_CAPSULE | Freq: Once | ORAL | Status: AC
  Administered 2022-06-28: 50 mg via ORAL
  Filled 2022-06-28: qty 2

## 2022-06-28 MED ORDER — FAM-TRASTUZUMAB DERUXTECAN-NXKI CHEMO 100 MG IV SOLR
3.6300 mg/kg | Freq: Once | INTRAVENOUS | Status: AC
  Administered 2022-06-28: 240 mg via INTRAVENOUS
  Filled 2022-06-28: qty 12

## 2022-06-28 MED ORDER — ACETAMINOPHEN 325 MG PO TABS
650.0000 mg | ORAL_TABLET | Freq: Once | ORAL | Status: AC
  Administered 2022-06-28: 650 mg via ORAL
  Filled 2022-06-28: qty 2

## 2022-06-28 MED ORDER — HEPARIN SOD (PORK) LOCK FLUSH 100 UNIT/ML IV SOLN
500.0000 [IU] | Freq: Once | INTRAVENOUS | Status: AC | PRN
  Administered 2022-06-28: 500 [IU]

## 2022-06-28 MED ORDER — SODIUM CHLORIDE 0.9% FLUSH
10.0000 mL | INTRAVENOUS | Status: DC | PRN
  Administered 2022-06-28: 10 mL

## 2022-06-28 NOTE — Progress Notes (Signed)
Sent in application for free Injectafer to [employer]

## 2022-06-28 NOTE — Patient Instructions (Signed)
Fam-Trastuzumab Deruxtecan Injection What is this medication? FAM-TRASTUZUMAB DERUXTECAN (fam-tras TOOZ eu mab DER ux TEE kan) treats some types of cancer. It works by blocking a protein that causes cancer cells to grow and multiply. This helps to slow or stop the spread of cancer cells. This medicine may be used for other purposes; ask your health care provider or pharmacist if you have questions. COMMON BRAND NAME(S): ENHERTU What should I tell my care team before I take this medication? They need to know if you have any of these conditions: Heart disease Heart failure Infection, especially a viral infection, such as chickenpox, cold sores, or herpes Liver disease Lung or breathing disease, such as asthma or COPD An unusual or allergic reaction to fam-trastuzumab deruxtecan, other medications, foods, dyes, or preservatives Pregnant or trying to get pregnant Breast-feeding How should I use this medication? This medication is injected into a vein. It is given by your care team in a hospital or clinic setting. A special MedGuide will be given to you before each treatment. Be sure to read this information carefully each time. Talk to your care team about the use of this medication in children. Special care may be needed. Overdosage: If you think you have taken too much of this medicine contact a poison control center or emergency room at once. NOTE: This medicine is only for you. Do not share this medicine with others. What if I miss a dose? It is important not to miss your dose. Call your care team if you are unable to keep an appointment. What may interact with this medication? Interactions are not expected. This list may not describe all possible interactions. Give your health care provider a list of all the medicines, herbs, non-prescription drugs, or dietary supplements you use. Also tell them if you smoke, drink alcohol, or use illegal drugs. Some items may interact with your  medicine. What should I watch for while using this medication? Visit your care team for regular checks on your progress. Tell your care team if your symptoms do not start to get better or if they get worse. Your condition will be monitored carefully while you are receiving this medication. Do not become pregnant while taking this medication or for 7 months after stopping it. Women should inform their care team if they wish to become pregnant or think they might be pregnant. Men should not father a child while taking this medication and for 4 months after stopping it. There is potential for serious side effects to an unborn child. Talk to your care team for more information. Do not breast-feed an infant while taking this medication or for 7 months after the last dose. This medication has caused decreased sperm counts in some men. This may make it more difficult to father a child. Talk to your care team if you are concerned about your fertility. This medication may increase your risk to bruise or bleed. Call your care team if you notice any unusual bleeding. Be careful brushing or flossing your teeth or using a toothpick because you may get an infection or bleed more easily. If you have any dental work done, tell your dentist you are receiving this medication. This medication may cause dry eyes and blurred vision. If you wear contact lenses, you may feel some discomfort. Lubricating eye drops may help. See your care team if the problem does not go away or is severe. This medication may increase your risk of getting an infection. Call your care team for  advice if you get a fever, chills, sore throat, or other symptoms of a cold or flu. Do not treat yourself. Try to avoid being around people who are sick. Avoid taking medications that contain aspirin, acetaminophen, ibuprofen, naproxen, or ketoprofen unless instructed by your care team. These medications may hide a fever. What side effects may I notice from  receiving this medication? Side effects that you should report to your care team as soon as possible: Allergic reactions--skin rash, itching, hives, swelling of the face, lips, tongue, or throat Dry cough, shortness of breath or trouble breathing Infection--fever, chills, cough, sore throat, wounds that don't heal, pain or trouble when passing urine, general feeling of discomfort or being unwell Heart failure--shortness of breath, swelling of the ankles, feet, or hands, sudden weight gain, unusual weakness or fatigue Unusual bruising or bleeding Side effects that usually do not require medical attention (report these to your care team if they continue or are bothersome): Constipation Diarrhea Hair loss Muscle pain Nausea Vomiting This list may not describe all possible side effects. Call your doctor for medical advice about side effects. You may report side effects to FDA at 1-800-FDA-1088. Where should I keep my medication? This medication is given in a hospital or clinic. It will not be stored at home. NOTE: This sheet is a summary. It may not cover all possible information. If you have questions about this medicine, talk to your doctor, pharmacist, or health care provider.  2023 Elsevier/Gold Standard (2021-06-28 00:00:00)

## 2022-06-29 ENCOUNTER — Encounter: Payer: Self-pay | Admitting: Oncology

## 2022-06-29 MED FILL — Ferric Carboxymaltose IV Soln 750 MG/15ML (Fe Equivalent): INTRAVENOUS | Qty: 15 | Status: AC

## 2022-06-29 NOTE — Progress Notes (Signed)
Patient is receiving Assistance Medication - Supplied Externally. Medication: Injectafer Manufacturer: [employer] Approval Dates: Approved from 05/29/2022 until 10/28/2022. ID: [id number] Reason: No Insurance First DOS: 07/03/2022

## 2022-06-29 NOTE — Progress Notes (Signed)
Sent in request for DOS 07/03/2022 to [employer].

## 2022-07-03 ENCOUNTER — Inpatient Hospital Stay: Payer: Self-pay | Attending: Oncology

## 2022-07-03 ENCOUNTER — Other Ambulatory Visit: Payer: Self-pay | Admitting: Pharmacist

## 2022-07-03 VITALS — BP 112/74 | HR 81 | Temp 98.1°F | Resp 18 | Ht 61.0 in | Wt 146.0 lb

## 2022-07-03 DIAGNOSIS — Z171 Estrogen receptor negative status [ER-]: Secondary | ICD-10-CM

## 2022-07-03 DIAGNOSIS — R300 Dysuria: Secondary | ICD-10-CM | POA: Insufficient documentation

## 2022-07-03 DIAGNOSIS — D709 Neutropenia, unspecified: Secondary | ICD-10-CM | POA: Insufficient documentation

## 2022-07-03 DIAGNOSIS — Z170 Estrogen receptor positive status [ER+]: Secondary | ICD-10-CM | POA: Insufficient documentation

## 2022-07-03 DIAGNOSIS — Z5112 Encounter for antineoplastic immunotherapy: Secondary | ICD-10-CM | POA: Insufficient documentation

## 2022-07-03 DIAGNOSIS — Z79899 Other long term (current) drug therapy: Secondary | ICD-10-CM | POA: Insufficient documentation

## 2022-07-03 DIAGNOSIS — C7802 Secondary malignant neoplasm of left lung: Secondary | ICD-10-CM | POA: Insufficient documentation

## 2022-07-03 DIAGNOSIS — C7801 Secondary malignant neoplasm of right lung: Secondary | ICD-10-CM | POA: Insufficient documentation

## 2022-07-03 DIAGNOSIS — C7931 Secondary malignant neoplasm of brain: Secondary | ICD-10-CM | POA: Insufficient documentation

## 2022-07-03 DIAGNOSIS — D509 Iron deficiency anemia, unspecified: Secondary | ICD-10-CM | POA: Insufficient documentation

## 2022-07-03 DIAGNOSIS — C50919 Malignant neoplasm of unspecified site of unspecified female breast: Secondary | ICD-10-CM | POA: Insufficient documentation

## 2022-07-03 MED ORDER — FAMOTIDINE IN NACL 20-0.9 MG/50ML-% IV SOLN
20.0000 mg | Freq: Once | INTRAVENOUS | Status: AC
  Administered 2022-07-03: 20 mg via INTRAVENOUS
  Filled 2022-07-03: qty 50

## 2022-07-03 MED ORDER — FILGRASTIM-SNDZ 480 MCG/0.8ML IJ SOSY
480.0000 ug | PREFILLED_SYRINGE | Freq: Once | INTRAMUSCULAR | Status: AC
  Administered 2022-07-03: 480 ug via SUBCUTANEOUS

## 2022-07-03 MED ORDER — ACETAMINOPHEN 325 MG PO TABS
650.0000 mg | ORAL_TABLET | Freq: Once | ORAL | Status: AC
  Administered 2022-07-03: 650 mg via ORAL
  Filled 2022-07-03: qty 2

## 2022-07-03 MED ORDER — SODIUM CHLORIDE 0.9 % IV SOLN
750.0000 mg | Freq: Once | INTRAVENOUS | Status: AC
  Administered 2022-07-03: 750 mg via INTRAVENOUS
  Filled 2022-07-03: qty 15

## 2022-07-03 MED ORDER — SODIUM CHLORIDE 0.9 % IV SOLN: Freq: Once | INTRAVENOUS | Status: AC

## 2022-07-03 NOTE — Patient Instructions (Signed)
Ferric Carboxymaltose Injection What is this medication? FERRIC CARBOXYMALTOSE (FER ik kar BOX ee MAWL tose) treats low levels of iron in your body (iron deficiency anemia). Iron is a mineral that plays an important role in making red blood cells, which carry oxygen from your lungs to the rest of your body. This medicine may be used for other purposes; ask your health care provider or pharmacist if you have questions. COMMON BRAND NAME(S): Injectafer What should I tell my care team before I take this medication? They need to know if you have any of these conditions: High blood pressure High levels of iron in the blood An unusual or allergic reaction to iron, other medications, foods, dyes, or preservatives Pregnant or trying to get pregnant Breast-feeding How should I use this medication? This medication is injected into a vein. It is given by your care team in a hospital or clinic setting. Talk to your care team about the use of this medication in children. While it may be given to children as young as 1 year for selected conditions, precautions do apply. Overdosage: If you think you have taken too much of this medicine contact a poison control center or emergency room at once. NOTE: This medicine is only for you. Do not share this medicine with others. What if I miss a dose? Keep appointments for follow-up doses. It is important not to miss your dose. Call your care team if you are unable to keep an appointment. What may interact with this medication? Do not take this medication with any of the following medications: Deferoxamine Dimercaprol Other iron products This list may not describe all possible interactions. Give your health care provider a list of all the medicines, herbs, non-prescription drugs, or dietary supplements you use. Also tell them if you smoke, drink alcohol, or use illegal drugs. Some items may interact with your medicine. What should I watch for while using this  medication? Visit your care team for regular checks on your progress. Tell your care team if your symptoms do not start to get better or if they get worse. You may need blood work while you are taking this medication. You may need to eat more foods that contain iron. Talk to your care team. Foods that contain iron include whole grains/cereals, dried fruits, beans, or peas, leafy green vegetables, and organ meats (liver, kidney). What side effects may I notice from receiving this medication? Side effects that you should report to your care team as soon as possible: Allergic reactions--skin rash, itching, hives, swelling of the face, lips, tongue, or throat Increase in blood pressure Low blood pressure--dizziness, feeling faint or lightheaded, blurry vision Shortness of breath Side effects that usually do not require medical attention (report to your care team if they continue or are bothersome): Flushing Headache Joint pain Muscle pain Nausea Pain, redness, or irritation at injection site This list may not describe all possible side effects. Call your doctor for medical advice about side effects. You may report side effects to FDA at 1-800-FDA-1088. Where should I keep my medication? This medication is given in a hospital or clinic. It will not be stored at home. NOTE: This sheet is a summary. It may not cover all possible information. If you have questions about this medicine, talk to your doctor, pharmacist, or health care provider.  2023 Elsevier/Gold Standard (2012-05-29 00:00:00)  

## 2022-07-06 NOTE — Progress Notes (Signed)
Sent in application for free drug to [employer] for [place].

## 2022-07-18 NOTE — Progress Notes (Signed)
Sent in request for DOS 07/25/2022 and 07/27/2022 to [employer].

## 2022-07-21 ENCOUNTER — Encounter: Payer: Self-pay | Admitting: Oncology

## 2022-07-24 ENCOUNTER — Ambulatory Visit: Payer: Self-pay | Admitting: Oncology

## 2022-07-24 ENCOUNTER — Other Ambulatory Visit: Payer: Self-pay

## 2022-07-24 NOTE — Progress Notes (Signed)
[facility]  [address] [place],  [place]  [address] [phone]  Clinic Day:  07/25/2022  Referring physician: Marguerita Merles, MD  HISTORY OF PRESENT ILLNESS:  The patient is a 71 y.o. 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  She also had a subcarinal lymph node for which scans have shown a positive response to therapy. She comes in today to be evaluated before heading into her 23rd cycle of Enhertu. The patient claims that she tolerated her 22nd cycle of treatment fairly well.  The patient brings to my attention that she believes her left forearm is swollen.  Furthermore, despite receiving antibiotics for an E. coli urinary tract infection, she still has occasional dysuria which concerns her for persistent UTI being present.  Of note, the patient recently received IV iron for iron deficiency anemia, from which she has noticed an improvement in how she feels on a daily basis.  As it pertains to her metastatic breast cancer, she denies having any new symptoms or findings which concern her for possible disease progression.  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  The patient received 6 cycles of Taxotere/Herceptin/Perjeta contained.  She was then before being switched to maintenance Perjeta/Herceptin, for which she took 73 cycles of treatment.  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  She had disease recurrence in early 2022, which manifested itself as a left suboccipital and subcarinal nodal metastasis.  This all happened in the setting of a prolonged treatment break from TDM-1.  Since recurrence was found, the patient has been on Enhertu, which  began in May 2022.  VITALS:  Blood pressure 116/66, pulse 80, temperature (!) 97.5 F (36.4 C), resp. rate 14, height 5' 1" (1.549 m), weight 147 lb 5.4 oz (66.8 kg), SpO2 96 %.  Wt Readings from Last 3 Encounters:  07/25/22 147 lb 5.4 oz (66.8 kg)  07/03/22 146 lb (66.2 kg)  06/28/22 146 lb (66.2 kg)    Body mass index is 27.84 kg/m.  Performance status (ECOG): 1 - Symptomatic but completely ambulatory  PHYSICAL EXAM:  Physical Exam Constitutional:      General: She is not in acute distress.    Appearance: Normal appearance. She is normal weight.  HENT:     Head: Normocephalic and atraumatic.  Eyes:     General: No scleral icterus.    Extraocular Movements: Extraocular movements intact.     Conjunctiva/sclera: Conjunctivae normal.     Pupils: Pupils are equal, round, and reactive to light.  Cardiovascular:     Rate and Rhythm: Normal rate and regular rhythm.     Pulses: Normal pulses.     Heart sounds: Normal heart sounds. No murmur heard.    No friction rub. No gallop.  Pulmonary:     Effort: Pulmonary effort is normal. No respiratory distress.     Breath sounds: Normal breath sounds.  Abdominal:     General: Bowel sounds are normal. There is no distension.     Palpations: Abdomen is soft. There is no hepatomegaly, splenomegaly or mass.     Tenderness: There is no abdominal tenderness.  Musculoskeletal:        General: Normal range of  motion.     Left forearm: Swelling (minimal in amount) present.     Cervical back: Normal range of motion and neck supple.     Right lower leg: No edema.     Left lower leg: No edema.  Lymphadenopathy:     Cervical: No cervical adenopathy.  Skin:    General: Skin is warm and dry.  Neurological:     General: No focal deficit present.     Mental Status: She is alert and oriented to person, place, and time. Mental status is at baseline.  Psychiatric:        Mood and Affect: Mood normal.        Behavior: Behavior normal.         Thought Content: Thought content normal.        Judgment: Judgment normal.    LABS:    ASSESSMENT & PLAN:  Assessment/Plan:  A 71 y.o. 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  She also has had subcarinal nodal disease that has resolved over time with therapy.  The patient will proceed with her 23rd cycle of Enhertu this week.  As her left arm is mildly swollen, I will have a Doppler ultrasound done of this extremity today to rule out a DVT.  Of note, there was no bulky left axillary or left supraclavicular lymphadenopathy present to cause her left arm swelling.  I will notify her of the results as soon as they become available.  With respect to her dysuria, a urinalysis and urine culture will be collected today.  I will inform her of these results as soon as they become available.   Overall, she appears to be doing well.  I will see this patient back in 3 weeks before she heads into her 24th cycle of Enhertu.  The patient understands all the plans discussed today and is in agreement with them.    [name], MD

## 2022-07-25 ENCOUNTER — Inpatient Hospital Stay: Payer: Self-pay

## 2022-07-25 ENCOUNTER — Inpatient Hospital Stay (HOSPITAL_BASED_OUTPATIENT_CLINIC_OR_DEPARTMENT_OTHER): Payer: Self-pay | Admitting: Oncology

## 2022-07-25 ENCOUNTER — Other Ambulatory Visit: Payer: Self-pay | Admitting: Oncology

## 2022-07-25 DIAGNOSIS — C50912 Malignant neoplasm of unspecified site of left female breast: Secondary | ICD-10-CM

## 2022-07-25 DIAGNOSIS — C50412 Malignant neoplasm of upper-outer quadrant of left female breast: Secondary | ICD-10-CM

## 2022-07-25 DIAGNOSIS — Z171 Estrogen receptor negative status [ER-]: Secondary | ICD-10-CM

## 2022-07-25 LAB — BASIC METABOLIC PANEL
BUN: 14 (ref 4–21)
CO2: 20 (ref 13–22)
Chloride: 111 — AB (ref 99–108)
Creatinine: 0.6 (ref 0.5–1.1)
Glucose: 169
Potassium: 3.6 mEq/L (ref 3.5–5.1)
Sodium: 138 (ref 137–147)

## 2022-07-25 LAB — HEPATIC FUNCTION PANEL
ALT: 38 U/L — AB (ref 7–35)
AST: 49 — AB (ref 13–35)
Alkaline Phosphatase: 134 — AB (ref 25–125)
Bilirubin, Total: 0.5

## 2022-07-25 LAB — CBC AND DIFFERENTIAL
HCT: 35 — AB (ref 36–46)
Hemoglobin: 11.5 — AB (ref 12.0–16.0)
Neutrophils Absolute: 2.44
Platelets: 181 10*3/uL (ref 150–400)
WBC: 3.7

## 2022-07-25 LAB — CBC: RBC: 3.96 (ref 3.87–5.11)

## 2022-07-25 LAB — COMPREHENSIVE METABOLIC PANEL
Albumin: 3.5 (ref 3.5–5.0)
Calcium: 9.1 (ref 8.7–10.7)

## 2022-07-26 ENCOUNTER — Encounter: Payer: Self-pay | Admitting: Oncology

## 2022-07-26 ENCOUNTER — Other Ambulatory Visit: Payer: Self-pay | Admitting: Oncology

## 2022-07-26 ENCOUNTER — Other Ambulatory Visit: Payer: Self-pay

## 2022-07-26 DIAGNOSIS — Z171 Estrogen receptor negative status [ER-]: Secondary | ICD-10-CM

## 2022-07-26 MED FILL — Dexamethasone Sodium Phosphate Inj 100 MG/10ML: INTRAMUSCULAR | Qty: 1 | Status: AC

## 2022-07-26 MED FILL — Ferric Carboxymaltose IV Soln 750 MG/15ML (Fe Equivalent): INTRAVENOUS | Qty: 15 | Status: AC

## 2022-07-26 MED FILL — Fam-Trastuzumab Deruxtecan-nxki For IV Soln 100 MG: INTRAVENOUS | Qty: 12 | Status: AC

## 2022-07-27 ENCOUNTER — Inpatient Hospital Stay: Payer: Self-pay

## 2022-07-27 VITALS — BP 113/65 | HR 78 | Temp 98.2°F | Resp 18 | Wt 147.0 lb

## 2022-07-27 DIAGNOSIS — D509 Iron deficiency anemia, unspecified: Secondary | ICD-10-CM

## 2022-07-27 DIAGNOSIS — C50412 Malignant neoplasm of upper-outer quadrant of left female breast: Secondary | ICD-10-CM

## 2022-07-27 MED ORDER — SODIUM CHLORIDE 0.9% FLUSH
10.0000 mL | INTRAVENOUS | Status: DC | PRN
  Administered 2022-07-27: 10 mL

## 2022-07-27 MED ORDER — SODIUM CHLORIDE 0.9 % IV SOLN
750.0000 mg | Freq: Once | INTRAVENOUS | Status: AC
  Administered 2022-07-27: 750 mg via INTRAVENOUS
  Filled 2022-07-27: qty 15

## 2022-07-27 MED ORDER — SODIUM CHLORIDE 0.9 % IV SOLN
10.0000 mg | Freq: Once | INTRAVENOUS | Status: AC
  Administered 2022-07-27: 10 mg via INTRAVENOUS
  Filled 2022-07-27: qty 10

## 2022-07-27 MED ORDER — ACETAMINOPHEN 325 MG PO TABS
650.0000 mg | ORAL_TABLET | Freq: Once | ORAL | Status: AC
  Administered 2022-07-27: 650 mg via ORAL
  Filled 2022-07-27: qty 2

## 2022-07-27 MED ORDER — PALONOSETRON HCL INJECTION 0.25 MG/5ML
0.2500 mg | Freq: Once | INTRAVENOUS | Status: AC
  Administered 2022-07-27: 0.25 mg via INTRAVENOUS
  Filled 2022-07-27: qty 5

## 2022-07-27 MED ORDER — FAMOTIDINE IN NACL 20-0.9 MG/50ML-% IV SOLN
20.0000 mg | Freq: Once | INTRAVENOUS | Status: AC
  Administered 2022-07-27: 20 mg via INTRAVENOUS
  Filled 2022-07-27: qty 50

## 2022-07-27 MED ORDER — HEPARIN SOD (PORK) LOCK FLUSH 100 UNIT/ML IV SOLN
500.0000 [IU] | Freq: Once | INTRAVENOUS | Status: AC | PRN
  Administered 2022-07-27: 500 [IU]

## 2022-07-27 MED ORDER — DEXTROSE 5 % IV SOLN: Freq: Once | INTRAVENOUS | Status: AC

## 2022-07-27 MED ORDER — FAM-TRASTUZUMAB DERUXTECAN-NXKI CHEMO 100 MG IV SOLR
3.6300 mg/kg | Freq: Once | INTRAVENOUS | Status: AC
  Administered 2022-07-27: 240 mg via INTRAVENOUS
  Filled 2022-07-27: qty 12

## 2022-07-27 MED ORDER — SODIUM CHLORIDE 0.9 % IV SOLN: Freq: Once | INTRAVENOUS | Status: AC

## 2022-07-27 MED ORDER — DIPHENHYDRAMINE HCL 25 MG PO CAPS
50.0000 mg | ORAL_CAPSULE | Freq: Once | ORAL | Status: AC
  Administered 2022-07-27: 50 mg via ORAL
  Filled 2022-07-27: qty 2

## 2022-07-27 NOTE — Progress Notes (Signed)
Patient is receiving Assistance Medication - Supplied Externally. Medication: Zarxio Manufacturer: [employer] Approval Dates: Approved from 07/13/2022 until 10/28/2022. ID:  Reason: No insurance First DOS:

## 2022-07-27 NOTE — Patient Instructions (Signed)
Fam-Trastuzumab Deruxtecan Injection What is this medication? FAM-TRASTUZUMAB DERUXTECAN (fam-tras TOOZ eu mab DER ux TEE kan) treats some types of cancer. It works by blocking a protein that causes cancer cells to grow and multiply. This helps to slow or stop the spread of cancer cells. This medicine may be used for other purposes; ask your health care provider or pharmacist if you have questions. COMMON BRAND NAME(S): ENHERTU What should I tell my care team before I take this medication? They need to know if you have any of these conditions: Heart disease Heart failure Infection, especially a viral infection, such as chickenpox, cold sores, or herpes Liver disease Lung or breathing disease, such as asthma or COPD An unusual or allergic reaction to fam-trastuzumab deruxtecan, other medications, foods, dyes, or preservatives Pregnant or trying to get pregnant Breast-feeding How should I use this medication? This medication is injected into a vein. It is given by your care team in a hospital or clinic setting. A special MedGuide will be given to you before each treatment. Be sure to read this information carefully each time. Talk to your care team about the use of this medication in children. Special care may be needed. Overdosage: If you think you have taken too much of this medicine contact a poison control center or emergency room at once. NOTE: This medicine is only for you. Do not share this medicine with others. What if I miss a dose? It is important not to miss your dose. Call your care team if you are unable to keep an appointment. What may interact with this medication? Interactions are not expected. This list may not describe all possible interactions. Give your health care provider a list of all the medicines, herbs, non-prescription drugs, or dietary supplements you use. Also tell them if you smoke, drink alcohol, or use illegal drugs. Some items may interact with your  medicine. What should I watch for while using this medication? Visit your care team for regular checks on your progress. Tell your care team if your symptoms do not start to get better or if they get worse. Your condition will be monitored carefully while you are receiving this medication. Do not become pregnant while taking this medication or for 7 months after stopping it. Women should inform their care team if they wish to become pregnant or think they might be pregnant. Men should not father a child while taking this medication and for 4 months after stopping it. There is potential for serious side effects to an unborn child. Talk to your care team for more information. Do not breast-feed an infant while taking this medication or for 7 months after the last dose. This medication has caused decreased sperm counts in some men. This may make it more difficult to father a child. Talk to your care team if you are concerned about your fertility. This medication may increase your risk to bruise or bleed. Call your care team if you notice any unusual bleeding. Be careful brushing or flossing your teeth or using a toothpick because you may get an infection or bleed more easily. If you have any dental work done, tell your dentist you are receiving this medication. This medication may cause dry eyes and blurred vision. If you wear contact lenses, you may feel some discomfort. Lubricating eye drops may help. See your care team if the problem does not go away or is severe. This medication may increase your risk of getting an infection. Call your care team for  advice if you get a fever, chills, sore throat, or other symptoms of a cold or flu. Do not treat yourself. Try to avoid being around people who are sick. Avoid taking medications that contain aspirin, acetaminophen, ibuprofen, naproxen, or ketoprofen unless instructed by your care team. These medications may hide a fever. What side effects may I notice from  receiving this medication? Side effects that you should report to your care team as soon as possible: Allergic reactions--skin rash, itching, hives, swelling of the face, lips, tongue, or throat Dry cough, shortness of breath or trouble breathing Infection--fever, chills, cough, sore throat, wounds that don't heal, pain or trouble when passing urine, general feeling of discomfort or being unwell Heart failure--shortness of breath, swelling of the ankles, feet, or hands, sudden weight gain, unusual weakness or fatigue Unusual bruising or bleeding Side effects that usually do not require medical attention (report these to your care team if they continue or are bothersome): Constipation Diarrhea Hair loss Muscle pain Nausea Vomiting This list may not describe all possible side effects. Call your doctor for medical advice about side effects. You may report side effects to FDA at 1-800-FDA-1088. Where should I keep my medication? This medication is given in a hospital or clinic. It will not be stored at home. NOTE: This sheet is a summary. It may not cover all possible information. If you have questions about this medicine, talk to your doctor, pharmacist, or health care provider.  2023 Elsevier/Gold Standard (2021-06-28 00:00:00)

## 2022-08-08 NOTE — Progress Notes (Signed)
Sent in request for DOS 08/14/2022 and 08/17/2022 to [employer].

## 2022-08-13 NOTE — Progress Notes (Unsigned)
[facility]  [address] [place],  [place]  [address] [phone]  Clinic Day:  08/14/2022  Referring physician: Marguerita Merles, MD  HISTORY OF PRESENT ILLNESS:  The patient is a 71 y.o. 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  She also had a subcarinal lymph node for which scans have shown a positive response to therapy. She comes in today to be evaluated before heading into her 24th cycle of Enhertu. The patient claims that she tolerated her 23rd cycle of treatment fairly well.  As it pertains to her metastatic breast cancer, she denies having any new symptoms or findings which concern her for possible disease progression.  The patient still has some intermittent back pain which she believes may be related to a urinary tract infection.  Of note, a recent urine culture did reveal E. coli.  The patient also had issues with left arm swelling at her last visit.  Fortunately, this has gone down over time.  A Doppler ultrasound done of her left upper extremity did not reveal a DVT.  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  The patient received 6 cycles of Taxotere/Herceptin/Perjeta before being switched to maintenance Perjeta/Herceptin, for which she took 73 cycles of treatment.  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  This all happened in the setting of a prolonged treatment break from TDM-1.  Since recurrence was found, the patient has been on Enhertu since May 2022.  VITALS:  Blood pressure 124/66, pulse 66, temperature  (!) 97.5 F (36.4 C), resp. rate 16, height 5' 1"  (1.549 m), weight 147 lb 8 oz (66.9 kg), SpO2 97 %.  Wt Readings from Last 3 Encounters:  08/14/22 147 lb 8 oz (66.9 kg)  07/27/22 147 lb (66.7 kg)  07/25/22 147 lb 5.4 oz (66.8 kg)    Body mass index is 27.87 kg/m.  Performance status (ECOG): 1 - Symptomatic but completely ambulatory  PHYSICAL EXAM:  Physical Exam Constitutional:      General: She is not in acute distress.    Appearance: Normal appearance. She is normal weight.  HENT:     Head: Normocephalic and atraumatic.  Eyes:     General: No scleral icterus.    Extraocular Movements: Extraocular movements intact.     Conjunctiva/sclera: Conjunctivae normal.     Pupils: Pupils are equal, round, and reactive to light.  Cardiovascular:     Rate and Rhythm: Normal rate and regular rhythm.     Pulses: Normal pulses.     Heart sounds: Normal heart sounds. No murmur heard.    No friction rub. No gallop.  Pulmonary:     Effort: Pulmonary effort is normal. No respiratory distress.     Breath sounds: Normal breath sounds.  Abdominal:     General: Bowel sounds are normal. There is no distension.     Palpations: Abdomen is soft. There is no hepatomegaly, splenomegaly or mass.     Tenderness: There is no abdominal tenderness.  Musculoskeletal:        General: Normal range of motion.  Left forearm: Swelling (minimal in amount) present.     Cervical back: Normal range of motion and neck supple.     Right lower leg: No edema.     Left lower leg: No edema.  Lymphadenopathy:     Cervical: No cervical adenopathy.  Skin:    General: Skin is warm and dry.  Neurological:     General: No focal deficit present.     Mental Status: She is alert and oriented to person, place, and time. Mental status is at baseline.  Psychiatric:        Mood and Affect: Mood normal.        Behavior: Behavior normal.        Thought Content: Thought content normal.        Judgment: Judgment normal.     LABS:  Latest Reference Range & Units 08/14/22 00:00  Sodium 137 - 147  138 (E)  Potassium 3.5 - 5.1 mEq/L 3.6 (E)  Chloride 99 - 108  111 ! (E)  CO2 13 - 22  24 ! (E)  Glucose  113 (E)  BUN 4 - 21  12 (E)  Creatinine 0.5 - 1.1  0.5 (E)  Calcium 8.7 - 10.7  9.4 (E)  Alkaline Phosphatase 25 - 125  143 ! (E)  Albumin 3.5 - 5.0  4.0 (E)  AST 13 - 35  55 ! (E)  ALT 7 - 35 U/L 36 ! (E)  Bilirubin, Total  0.6 (E)  WBC  3.1 (E)  RBC 3.87 - 5.11  4.23 (E)  Hemoglobin 12.0 - 16.0  12.6 (E)  HCT 36 - 46  38 (E)  Platelets 150 - 400 K/uL 134 ! (E)  NEUT#  2.08 (E)  !: Data is abnormal (E): External lab result  ASSESSMENT & PLAN:  Assessment/Plan:  A 71 y.o. 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  She also has had subcarinal nodal disease that has resolved over time with therapy.  The patient will proceed with her 24th cycle of Enhertu this week.  As a recent urine culture did reveal greater than 100,000 colony-forming units of E. coli, I will prescribe her Levaquin 750 mg daily for 5 days.  Overall, she appears to be doing well.  I will see this patient back in 3 weeks before she heads into her 25th cycle of Enhertu.  The patient understands all the plans discussed today and is in agreement with them.    Braydyn Schultes Macarthur Critchley, MD

## 2022-08-14 ENCOUNTER — Other Ambulatory Visit: Payer: Self-pay | Admitting: Oncology

## 2022-08-14 ENCOUNTER — Inpatient Hospital Stay: Payer: Self-pay | Attending: Oncology

## 2022-08-14 ENCOUNTER — Other Ambulatory Visit: Payer: Self-pay

## 2022-08-14 ENCOUNTER — Inpatient Hospital Stay (HOSPITAL_BASED_OUTPATIENT_CLINIC_OR_DEPARTMENT_OTHER): Payer: Self-pay | Admitting: Oncology

## 2022-08-14 DIAGNOSIS — Z79899 Other long term (current) drug therapy: Secondary | ICD-10-CM | POA: Insufficient documentation

## 2022-08-14 DIAGNOSIS — Z171 Estrogen receptor negative status [ER-]: Secondary | ICD-10-CM | POA: Insufficient documentation

## 2022-08-14 DIAGNOSIS — C50919 Malignant neoplasm of unspecified site of unspecified female breast: Secondary | ICD-10-CM | POA: Insufficient documentation

## 2022-08-14 DIAGNOSIS — C7931 Secondary malignant neoplasm of brain: Secondary | ICD-10-CM | POA: Insufficient documentation

## 2022-08-14 DIAGNOSIS — C50412 Malignant neoplasm of upper-outer quadrant of left female breast: Secondary | ICD-10-CM

## 2022-08-14 DIAGNOSIS — Z5112 Encounter for antineoplastic immunotherapy: Secondary | ICD-10-CM | POA: Insufficient documentation

## 2022-08-14 LAB — HEPATIC FUNCTION PANEL
ALT: 36 U/L — AB (ref 7–35)
AST: 55 — AB (ref 13–35)
Alkaline Phosphatase: 143 — AB (ref 25–125)
Bilirubin, Total: 0.6

## 2022-08-14 LAB — BASIC METABOLIC PANEL
BUN: 12 (ref 4–21)
CO2: 24 — AB (ref 13–22)
Chloride: 111 — AB (ref 99–108)
Creatinine: 0.5 (ref 0.5–1.1)
Glucose: 113
Potassium: 3.6 mEq/L (ref 3.5–5.1)
Sodium: 138 (ref 137–147)

## 2022-08-14 LAB — CBC: RBC: 4.23 (ref 3.87–5.11)

## 2022-08-14 LAB — COMPREHENSIVE METABOLIC PANEL
Albumin: 4 (ref 3.5–5.0)
Calcium: 9.4 (ref 8.7–10.7)

## 2022-08-14 LAB — CBC AND DIFFERENTIAL
HCT: 38 (ref 36–46)
Hemoglobin: 12.6 (ref 12.0–16.0)
Neutrophils Absolute: 2.08
Platelets: 134 10*3/uL — AB (ref 150–400)
WBC: 3.1

## 2022-08-14 MED ORDER — LEVOFLOXACIN 750 MG PO TABS: 750.0000 mg | ORAL_TABLET | Freq: Every day | ORAL | 0 refills | Status: DC

## 2022-08-16 MED FILL — Dexamethasone Sodium Phosphate Inj 100 MG/10ML: INTRAMUSCULAR | Qty: 1 | Status: AC

## 2022-08-16 MED FILL — Fam-Trastuzumab Deruxtecan-nxki For IV Soln 100 MG: INTRAVENOUS | Qty: 12 | Status: AC

## 2022-08-17 ENCOUNTER — Inpatient Hospital Stay: Payer: Self-pay

## 2022-08-17 VITALS — BP 137/87 | HR 61 | Temp 98.1°F | Resp 17 | Wt 146.0 lb

## 2022-08-17 DIAGNOSIS — C50412 Malignant neoplasm of upper-outer quadrant of left female breast: Secondary | ICD-10-CM

## 2022-08-17 MED ORDER — DIPHENHYDRAMINE HCL 25 MG PO CAPS
50.0000 mg | ORAL_CAPSULE | Freq: Once | ORAL | Status: AC
  Administered 2022-08-17: 50 mg via ORAL
  Filled 2022-08-17: qty 2

## 2022-08-17 MED ORDER — SODIUM CHLORIDE 0.9 % IV SOLN
10.0000 mg | Freq: Once | INTRAVENOUS | Status: AC
  Administered 2022-08-17: 10 mg via INTRAVENOUS
  Filled 2022-08-17: qty 10

## 2022-08-17 MED ORDER — DEXTROSE 5 % IV SOLN: Freq: Once | INTRAVENOUS | Status: AC

## 2022-08-17 MED ORDER — SODIUM CHLORIDE 0.9% FLUSH
10.0000 mL | INTRAVENOUS | Status: DC | PRN
  Administered 2022-08-17: 10 mL

## 2022-08-17 MED ORDER — PALONOSETRON HCL INJECTION 0.25 MG/5ML
0.2500 mg | Freq: Once | INTRAVENOUS | Status: AC
  Administered 2022-08-17: 0.25 mg via INTRAVENOUS
  Filled 2022-08-17: qty 5

## 2022-08-17 MED ORDER — ACETAMINOPHEN 325 MG PO TABS
650.0000 mg | ORAL_TABLET | Freq: Once | ORAL | Status: AC
  Administered 2022-08-17: 650 mg via ORAL
  Filled 2022-08-17: qty 2

## 2022-08-17 MED ORDER — FAM-TRASTUZUMAB DERUXTECAN-NXKI CHEMO 100 MG IV SOLR
3.6300 mg/kg | Freq: Once | INTRAVENOUS | Status: AC
  Administered 2022-08-17: 240 mg via INTRAVENOUS
  Filled 2022-08-17: qty 12

## 2022-08-17 MED ORDER — HEPARIN SOD (PORK) LOCK FLUSH 100 UNIT/ML IV SOLN
500.0000 [IU] | Freq: Once | INTRAVENOUS | Status: AC | PRN
  Administered 2022-08-17: 500 [IU]

## 2022-08-17 NOTE — Progress Notes (Signed)
Patient tolerated ENHERTU infusion well, no questions/concerns voiced. Patient stable at discharge. AVS given.

## 2022-08-17 NOTE — Patient Instructions (Signed)
Instrucciones al darle de alta: Discharge Instructions Gracias por elegir al [facility] de Cncer de [place] para brindarle atencin mdica de oncologa y Music therapist.   Si usted tiene una cita de laboratorio con [place], por favor vaya directamente [place] y regstrese en el rea de Control and instrumentation engineer.   Use ropa cmoda y [place] para tener fcil acceso a las vas del Portacath (acceso venoso de Engineer, site duracin) o la lnea PICC (catter central colocado por va perifrica).   Nos esforzamos por ofrecerle tiempo de calidad con su proveedor. Es posible que tenga que volver a programar su cita si llega tarde (15 minutos o ms).  El llegar tarde le afecta a usted y a otros pacientes cuyas citas son posteriores a Merchandiser, retail.  Adems, si usted falta a tres o ms citas sin avisar a la oficina, puede ser retirado(a) de la clnica a discrecin del proveedor.      Para las solicitudes de renovacin de recetas, pida a su farmacia que se ponga en contacto con nuestra oficina y deje que transcurran 73 horas para que se complete el proceso de las renovaciones.    Hoy usted recibi los siguientes agentes de quimioterapia e/o inmunoterapia: ENHERTU   Para ayudar a prevenir las nuseas y los vmitos despus de su tratamiento, le recomendamos que tome su medicamento para las nuseas segn las indicaciones.  LOS SNTOMAS QUE DEBEN COMUNICARSE INMEDIATAMENTE SE INDICAN A CONTINUACIN: *FIEBRE SUPERIOR A 100.4 F (38 C) O MS *ESCALOFROS O SUDORACIN *NUSEAS Y VMITOS QUE NO SE CONTROLAN CON EL MEDICAMENTO PARA LAS NUSEAS *DIFICULTAD INUSUAL PARA RESPIRAR  *MORETONES O HEMORRAGIAS NO HABITUALES *PROBLEMAS URINARIOS (dolor o ardor al Garment/textile technologist o frecuencia para Garment/textile technologist) *PROBLEMAS INTESTINALES (diarrea inusual, estreimiento, dolor cerca del ano) SENSIBILIDAD EN LA BOCA Y EN LA GARGANTA CON O SIN LA PRESENCIA DE LCERAS (dolor de garganta, llagas en la boca o dolor de muelas/dientes) ERUPCIN,  HINCHAZN O DOLORES INUSUALES FLUJO VAGINAL INUSUAL O PICAZN/RASQUIA    Los puntos marcados con un asterisco ( *) indican una posible emergencia y debe hacer un seguimiento tan pronto como le sea posible o vaya al Departamento de Emergencias si se le presenta algn problema.  Por favor, muestre la [place] DE ADVERTENCIA DE Windy Canny DE ADVERTENCIA DE Benay Spice al registrarse en [address] de Emergencias y a la enfermera de triaje.  Si tiene preguntas despus de su visita o necesita cancelar o volver a programar su cita, por favor pngase en contacto con [place]  Dept: [phone]  y [place] instrucciones. Las horas de oficina son de 8:00 a.m. a 4:30 p.m. de lunes a viernes. Por favor, tenga en cuenta que los mensajes de voz que se dejan despus de las 4:00 p.m. posiblemente no se devolvern hasta el siguiente da de [place].  Cerramos los fines de semana y [employer]. En todo momento tiene acceso a una enfermera para preguntas urgentes. Por favor, llame al nmero principal de la clnica Dept: [phone] y [place] instrucciones.   Para cualquier pregunta que no sea de carcter urgente, tambin puede ponerse en contacto con su proveedor [employer] Ahora ofrecemos visitas electrnicas para cualquier persona mayor de 18 aos que solicite atencin mdica en lnea para los sntomas que no sean urgentes. Para ms detalles vaya a [email or web address].   Tambin puede bajar la aplicacin de MyChart! Vaya a la tienda de aplicaciones, busque "MyChart", abra la aplicacin, seleccione [employer],  e ingrese con su nombre de usuario y la contrasea de Pharmacist, community.  Las mscaras son opcionales en los centros de Hotel manager. Si desea que su equipo de cuidados mdicos use una [employer] atienden, por favor hgaselo saber al personal. Bethann Berkshire una persona de apoyo que tenga por lo menos 16 aos para que le acompae a sus  citas.

## 2022-08-21 ENCOUNTER — Encounter: Payer: Self-pay | Admitting: Oncology

## 2022-08-29 NOTE — Progress Notes (Signed)
Sent in request for DOS 09/04/2022 and 09/07/2022 to [employer].

## 2022-09-03 NOTE — Progress Notes (Signed)
[facility]  [address] [place],  [place]  [address] [phone]  Clinic Day:  09/04/2022  HISTORY OF PRESENT ILLNESS:  The patient is a 71 y.o. 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  She also had a subcarinal lymph node for which scans have shown a positive response to therapy. She comes in today to be evaluated before heading into her 25th cycle of Enhertu. The patient claims that she tolerated her 24th cycle of treatment fairly well.  As it pertains to her metastatic breast cancer, she denies having any new symptoms or findings which concern her for possible disease progression.    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  The patient received 6 cycles of Taxotere/Herceptin/Perjeta before being switched to maintenance Perjeta/Herceptin, for which she took 73 cycles of treatment.  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  This all happened in the setting of a prolonged treatment break from TDM-1.  Since recurrence was found, the patient has been on Enhertu since May 2022.  VITALS:  Blood pressure 126/63, pulse 77, temperature 97.7 F (36.5 C), resp. rate 16, height _0  (1.549 m), weight 148 lb 8 oz (67.4 kg), SpO2 97 %.  Wt Readings from Last 3 Encounters:  09/04/22 148 lb 8 oz (67.4 kg)  08/17/22 146 lb (66.2 kg)  08/14/22 147 lb 8 oz (66.9 kg)    Body mass index is 28.06 kg/m.  Performance status (ECOG): 1 - Symptomatic but completely ambulatory  PHYSICAL EXAM:  Physical Exam Constitutional:       General: She is not in acute distress.    Appearance: Normal appearance. She is normal weight.  HENT:     Head: Normocephalic and atraumatic.  Eyes:     General: No scleral icterus.    Extraocular Movements: Extraocular movements intact.     Conjunctiva/sclera: Conjunctivae normal.     Pupils: Pupils are equal, round, and reactive to light.  Cardiovascular:     Rate and Rhythm: Normal rate and regular rhythm.     Pulses: Normal pulses.     Heart sounds: Normal heart sounds. No murmur heard.    No friction rub. No gallop.  Pulmonary:     Effort: Pulmonary effort is normal. No respiratory distress.     Breath sounds: Normal breath sounds.  Abdominal:     General: Bowel sounds are normal. There is no distension.     Palpations: Abdomen is soft. There is no hepatomegaly, splenomegaly or mass.     Tenderness: There is no abdominal tenderness.  Musculoskeletal:        General: Normal range of motion.     Cervical back: Normal range of motion and neck supple.     Right lower leg: No edema.     Left lower leg: No edema.  Lymphadenopathy:     Cervical: No cervical adenopathy.  Skin:    General: Skin is warm and dry.  Neurological:     General: No focal deficit present.     Mental Status: She is alert and oriented to person, place,  and time. Mental status is at baseline.  Psychiatric:        Mood and Affect: Mood normal.        Behavior: Behavior normal.        Thought Content: Thought content normal.        Judgment: Judgment normal.    LABS:  Latest Reference Range & Units 09/04/22 14:09  Sodium 135 - 145 mmol/L 142  Potassium 3.5 - 5.1 mmol/L 3.9  Chloride 98 - 111 mmol/L 111  CO2 22 - 32 mmol/L 25  Glucose 70 - 99 mg/dL 94  BUN 8 - 23 mg/dL 11  Creatinine 0.44 - 1.00 mg/dL 0.71  Calcium 8.9 - 10.3 mg/dL 9.4  Anion gap 5 - 15  6  Alkaline Phosphatase 38 - 126 U/L 128 (H)  Albumin 3.5 - 5.0 g/dL 3.4 (L)  AST 15 - 41 U/L 45 (H)  ALT 0 - 44 U/L 36  Total Protein 6.5 - 8.1  g/dL 7.3  Total Bilirubin 0.3 - 1.2 mg/dL 0.4  GFR, Est Non African American >60 mL/min >60  WBC 4.0 - 10.5 K/uL 3.5 (L)  RBC 3.87 - 5.11 MIL/uL 3.96  Hemoglobin 12.0 - 15.0 g/dL 12.2  HCT 36.0 - 46.0 % 37.6  MCV 80.0 - 100.0 fL 94.9  MCH 26.0 - 34.0 pg 30.8  MCHC 30.0 - 36.0 g/dL 32.4  RDW 11.5 - 15.5 % 21.0 (H)  Platelets 150 - 400 K/uL 156  nRBC 0.0 - 0.2 % 0.0  Neutrophils % 59  Lymphocytes % 23  Monocytes Relative % 13  Eosinophil % 5  Basophil % 0  Immature Granulocytes % 0  NEUT# 1.7 - 7.7 K/uL 2.0  (H): Data is abnormally high (L): Data is abnormally low  ASSESSMENT & PLAN:  Assessment/Plan:  A 71 y.o. 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  She also has had subcarinal nodal disease that has resolved over time with therapy.  The patient will proceed with her 25th cycle of Enhertu this week.   Overall, she appears to be doing well.  I will see this patient back in 3 weeks before she heads into her 26th cycle of Enhertu.  CT scans will be done a day before her next visit to ascertain her new disease baseline after 25 cycles of Enhertu therapy.  The patient understands all the plans discussed today and is in agreement with them.    Kearie Mennen Macarthur Critchley, MD

## 2022-09-04 ENCOUNTER — Inpatient Hospital Stay: Payer: Self-pay | Attending: Oncology | Admitting: Oncology

## 2022-09-04 ENCOUNTER — Other Ambulatory Visit: Payer: Self-pay | Admitting: Oncology

## 2022-09-04 ENCOUNTER — Inpatient Hospital Stay: Payer: Self-pay

## 2022-09-04 VITALS — BP 126/63 | HR 77 | Temp 97.7°F | Resp 16 | Ht 61.0 in | Wt 148.5 lb

## 2022-09-04 DIAGNOSIS — C50412 Malignant neoplasm of upper-outer quadrant of left female breast: Secondary | ICD-10-CM

## 2022-09-04 DIAGNOSIS — C7931 Secondary malignant neoplasm of brain: Secondary | ICD-10-CM | POA: Insufficient documentation

## 2022-09-04 DIAGNOSIS — Z171 Estrogen receptor negative status [ER-]: Secondary | ICD-10-CM

## 2022-09-04 DIAGNOSIS — C50919 Malignant neoplasm of unspecified site of unspecified female breast: Secondary | ICD-10-CM | POA: Insufficient documentation

## 2022-09-04 DIAGNOSIS — C50912 Malignant neoplasm of unspecified site of left female breast: Secondary | ICD-10-CM

## 2022-09-04 DIAGNOSIS — Z5112 Encounter for antineoplastic immunotherapy: Secondary | ICD-10-CM | POA: Insufficient documentation

## 2022-09-04 DIAGNOSIS — Z79899 Other long term (current) drug therapy: Secondary | ICD-10-CM | POA: Insufficient documentation

## 2022-09-04 LAB — CBC WITH DIFFERENTIAL (CANCER CENTER ONLY)
Abs Immature Granulocytes: 0.01 10*3/uL (ref 0.00–0.07)
Basophils Absolute: 0 10*3/uL (ref 0.0–0.1)
Basophils Relative: 0 %
Eosinophils Absolute: 0.2 10*3/uL (ref 0.0–0.5)
Eosinophils Relative: 5 %
HCT: 37.6 % (ref 36.0–46.0)
Hemoglobin: 12.2 g/dL (ref 12.0–15.0)
Immature Granulocytes: 0 %
Lymphocytes Relative: 23 %
Lymphs Abs: 0.8 10*3/uL (ref 0.7–4.0)
MCH: 30.8 pg (ref 26.0–34.0)
MCHC: 32.4 g/dL (ref 30.0–36.0)
MCV: 94.9 fL (ref 80.0–100.0)
Monocytes Absolute: 0.4 10*3/uL (ref 0.1–1.0)
Monocytes Relative: 13 %
Neutro Abs: 2 10*3/uL (ref 1.7–7.7)
Neutrophils Relative %: 59 %
Platelet Count: 156 10*3/uL (ref 150–400)
RBC: 3.96 MIL/uL (ref 3.87–5.11)
RDW: 21 % — ABNORMAL HIGH (ref 11.5–15.5)
WBC Count: 3.5 10*3/uL — ABNORMAL LOW (ref 4.0–10.5)
nRBC: 0 % (ref 0.0–0.2)

## 2022-09-04 LAB — CMP (CANCER CENTER ONLY)
ALT: 36 U/L (ref 0–44)
AST: 45 U/L — ABNORMAL HIGH (ref 15–41)
Albumin: 3.4 g/dL — ABNORMAL LOW (ref 3.5–5.0)
Alkaline Phosphatase: 128 U/L — ABNORMAL HIGH (ref 38–126)
Anion gap: 6 (ref 5–15)
BUN: 11 mg/dL (ref 8–23)
CO2: 25 mmol/L (ref 22–32)
Calcium: 9.4 mg/dL (ref 8.9–10.3)
Chloride: 111 mmol/L (ref 98–111)
Creatinine: 0.71 mg/dL (ref 0.44–1.00)
GFR, Estimated: >60 mL/min
Glucose, Bld: 94 mg/dL (ref 70–99)
Potassium: 3.9 mmol/L (ref 3.5–5.1)
Sodium: 142 mmol/L (ref 135–145)
Total Bilirubin: 0.4 mg/dL (ref 0.3–1.2)
Total Protein: 7.3 g/dL (ref 6.5–8.1)

## 2022-09-06 MED FILL — Fam-Trastuzumab Deruxtecan-nxki For IV Soln 100 MG: INTRAVENOUS | Qty: 12 | Status: AC

## 2022-09-06 MED FILL — Dexamethasone Sodium Phosphate Inj 100 MG/10ML: INTRAMUSCULAR | Qty: 1 | Status: AC

## 2022-09-07 ENCOUNTER — Encounter: Payer: Self-pay | Admitting: Oncology

## 2022-09-07 ENCOUNTER — Inpatient Hospital Stay: Payer: Self-pay

## 2022-09-11 ENCOUNTER — Encounter: Payer: Self-pay | Admitting: Oncology

## 2022-09-12 ENCOUNTER — Other Ambulatory Visit: Payer: Self-pay

## 2022-09-13 ENCOUNTER — Ambulatory Visit: Payer: Self-pay

## 2022-09-14 ENCOUNTER — Inpatient Hospital Stay: Payer: Self-pay

## 2022-09-14 DIAGNOSIS — C50412 Malignant neoplasm of upper-outer quadrant of left female breast: Secondary | ICD-10-CM

## 2022-09-14 LAB — CBC WITH DIFFERENTIAL (CANCER CENTER ONLY)
Abs Immature Granulocytes: 0.01 10*3/uL (ref 0.00–0.07)
Basophils Absolute: 0 10*3/uL (ref 0.0–0.1)
Basophils Relative: 0 %
Eosinophils Absolute: 0.2 10*3/uL (ref 0.0–0.5)
Eosinophils Relative: 5 %
HCT: 35.5 % — ABNORMAL LOW (ref 36.0–46.0)
Hemoglobin: 11.4 g/dL — ABNORMAL LOW (ref 12.0–15.0)
Immature Granulocytes: 0 %
Lymphocytes Relative: 14 %
Lymphs Abs: 0.5 10*3/uL — ABNORMAL LOW (ref 0.7–4.0)
MCH: 30.2 pg (ref 26.0–34.0)
MCHC: 32.1 g/dL (ref 30.0–36.0)
MCV: 94.2 fL (ref 80.0–100.0)
Monocytes Absolute: 0.4 10*3/uL (ref 0.1–1.0)
Monocytes Relative: 11 %
Neutro Abs: 2.7 10*3/uL (ref 1.7–7.7)
Neutrophils Relative %: 70 %
Platelet Count: 138 10*3/uL — ABNORMAL LOW (ref 150–400)
RBC: 3.77 MIL/uL — ABNORMAL LOW (ref 3.87–5.11)
RDW: 19.1 % — ABNORMAL HIGH (ref 11.5–15.5)
WBC Count: 3.9 10*3/uL — ABNORMAL LOW (ref 4.0–10.5)
nRBC: 0 % (ref 0.0–0.2)

## 2022-09-14 LAB — CMP (CANCER CENTER ONLY)
ALT: 27 U/L (ref 0–44)
AST: 39 U/L (ref 15–41)
Albumin: 3.2 g/dL — ABNORMAL LOW (ref 3.5–5.0)
Alkaline Phosphatase: 115 U/L (ref 38–126)
Anion gap: 7 (ref 5–15)
BUN: 12 mg/dL (ref 8–23)
CO2: 23 mmol/L (ref 22–32)
Calcium: 8.9 mg/dL (ref 8.9–10.3)
Chloride: 110 mmol/L (ref 98–111)
Creatinine: 0.7 mg/dL (ref 0.44–1.00)
GFR, Estimated: >60 mL/min (ref >60)
Glucose, Bld: 112 mg/dL — ABNORMAL HIGH (ref 70–99)
Potassium: 3.9 mmol/L (ref 3.5–5.1)
Sodium: 140 mmol/L (ref 135–145)
Total Bilirubin: 0.4 mg/dL (ref 0.3–1.2)
Total Protein: 7.1 g/dL (ref 6.5–8.1)

## 2022-09-17 ENCOUNTER — Telehealth: Payer: Self-pay | Admitting: Oncology

## 2022-09-17 MED FILL — Dexamethasone Sodium Phosphate Inj 100 MG/10ML: INTRAMUSCULAR | Qty: 1 | Status: AC

## 2022-09-17 MED FILL — Fam-Trastuzumab Deruxtecan-nxki For IV Soln 100 MG: INTRAVENOUS | Qty: 12 | Status: AC

## 2022-09-17 NOTE — Telephone Encounter (Signed)
Patient has been scheduled. Aware of appt date and time   Scheduling Message Entered by Ulice Dash M on 09/17/2022 at 10:37 AM Priority: Routine  <No visit type provided>  Department: [facility]  Provider:  Appointment Notes:  Please schedule pt for an ECHO around 12/4.  Scheduling Notes:

## 2022-09-18 ENCOUNTER — Inpatient Hospital Stay: Payer: Self-pay

## 2022-09-18 VITALS — BP 109/68 | HR 77 | Temp 98.3°F | Resp 18 | Ht 61.0 in | Wt 149.1 lb

## 2022-09-18 DIAGNOSIS — Z171 Estrogen receptor negative status [ER-]: Secondary | ICD-10-CM

## 2022-09-18 MED ORDER — ACETAMINOPHEN 325 MG PO TABS
650.0000 mg | ORAL_TABLET | Freq: Once | ORAL | Status: AC
  Administered 2022-09-18: 650 mg via ORAL
  Filled 2022-09-18: qty 2

## 2022-09-18 MED ORDER — SODIUM CHLORIDE 0.9 % IV SOLN
10.0000 mg | Freq: Once | INTRAVENOUS | Status: AC
  Administered 2022-09-18: 10 mg via INTRAVENOUS
  Filled 2022-09-18: qty 1
  Filled 2022-09-18: qty 10

## 2022-09-18 MED ORDER — DEXTROSE 5 % IV SOLN: Freq: Once | INTRAVENOUS | Status: AC

## 2022-09-18 MED ORDER — PALONOSETRON HCL INJECTION 0.25 MG/5ML
0.2500 mg | Freq: Once | INTRAVENOUS | Status: AC
  Administered 2022-09-18: 0.25 mg via INTRAVENOUS
  Filled 2022-09-18: qty 5

## 2022-09-18 MED ORDER — DIPHENHYDRAMINE HCL 25 MG PO CAPS
50.0000 mg | ORAL_CAPSULE | Freq: Once | ORAL | Status: AC
  Administered 2022-09-18: 50 mg via ORAL
  Filled 2022-09-18: qty 2

## 2022-09-18 MED ORDER — FAM-TRASTUZUMAB DERUXTECAN-NXKI CHEMO 100 MG IV SOLR
3.6300 mg/kg | Freq: Once | INTRAVENOUS | Status: AC
  Administered 2022-09-18: 240 mg via INTRAVENOUS
  Filled 2022-09-18 (×2): qty 12

## 2022-09-18 NOTE — Patient Instructions (Signed)
Fam-Trastuzumab Deruxtecan Injection What is this medication? FAM-TRASTUZUMAB DERUXTECAN (fam-tras TOOZ eu mab DER ux TEE kan) treats some types of cancer. It works by blocking a protein that causes cancer cells to grow and multiply. This helps to slow or stop the spread of cancer cells. This medicine may be used for other purposes; ask your health care provider or pharmacist if you have questions. COMMON BRAND NAME(S): ENHERTU What should I tell my care team before I take this medication? They need to know if you have any of these conditions: Heart disease Heart failure Infection, especially a viral infection, such as chickenpox, cold sores, or herpes Liver disease Lung or breathing disease, such as asthma or COPD An unusual or allergic reaction to fam-trastuzumab deruxtecan, other medications, foods, dyes, or preservatives Pregnant or trying to get pregnant Breast-feeding How should I use this medication? This medication is injected into a vein. It is given by your care team in a hospital or clinic setting. A special MedGuide will be given to you before each treatment. Be sure to read this information carefully each time. Talk to your care team about the use of this medication in children. Special care may be needed. Overdosage: If you think you have taken too much of this medicine contact a poison control center or emergency room at once. NOTE: This medicine is only for you. Do not share this medicine with others. What if I miss a dose? It is important not to miss your dose. Call your care team if you are unable to keep an appointment. What may interact with this medication? Interactions are not expected. This list may not describe all possible interactions. Give your health care provider a list of all the medicines, herbs, non-prescription drugs, or dietary supplements you use. Also tell them if you smoke, drink alcohol, or use illegal drugs. Some items may interact with your  medicine. What should I watch for while using this medication? Visit your care team for regular checks on your progress. Tell your care team if your symptoms do not start to get better or if they get worse. Your condition will be monitored carefully while you are receiving this medication. Do not become pregnant while taking this medication or for 7 months after stopping it. Women should inform their care team if they wish to become pregnant or think they might be pregnant. Men should not father a child while taking this medication and for 4 months after stopping it. There is potential for serious side effects to an unborn child. Talk to your care team for more information. Do not breast-feed an infant while taking this medication or for 7 months after the last dose. This medication has caused decreased sperm counts in some men. This may make it more difficult to father a child. Talk to your care team if you are concerned about your fertility. This medication may increase your risk to bruise or bleed. Call your care team if you notice any unusual bleeding. Be careful brushing or flossing your teeth or using a toothpick because you may get an infection or bleed more easily. If you have any dental work done, tell your dentist you are receiving this medication. This medication may cause dry eyes and blurred vision. If you wear contact lenses, you may feel some discomfort. Lubricating eye drops may help. See your care team if the problem does not go away or is severe. This medication may increase your risk of getting an infection. Call your care team for  advice if you get a fever, chills, sore throat, or other symptoms of a cold or flu. Do not treat yourself. Try to avoid being around people who are sick. Avoid taking medications that contain aspirin, acetaminophen, ibuprofen, naproxen, or ketoprofen unless instructed by your care team. These medications may hide a fever. What side effects may I notice from  receiving this medication? Side effects that you should report to your care team as soon as possible: Allergic reactions--skin rash, itching, hives, swelling of the face, lips, tongue, or throat Dry cough, shortness of breath or trouble breathing Infection--fever, chills, cough, sore throat, wounds that don't heal, pain or trouble when passing urine, general feeling of discomfort or being unwell Heart failure--shortness of breath, swelling of the ankles, feet, or hands, sudden weight gain, unusual weakness or fatigue Unusual bruising or bleeding Side effects that usually do not require medical attention (report these to your care team if they continue or are bothersome): Constipation Diarrhea Hair loss Muscle pain Nausea Vomiting This list may not describe all possible side effects. Call your doctor for medical advice about side effects. You may report side effects to FDA at 1-800-FDA-1088. Where should I keep my medication? This medication is given in a hospital or clinic. It will not be stored at home. NOTE: This sheet is a summary. It may not cover all possible information. If you have questions about this medicine, talk to your doctor, pharmacist, or health care provider.  2023 Elsevier/Gold Standard (2021-06-28 00:00:00)

## 2022-09-25 ENCOUNTER — Ambulatory Visit: Payer: Self-pay | Admitting: Oncology

## 2022-09-25 ENCOUNTER — Other Ambulatory Visit: Payer: Self-pay

## 2022-09-27 DIAGNOSIS — Z171 Estrogen receptor negative status [ER-]: Secondary | ICD-10-CM

## 2022-09-27 DIAGNOSIS — C50412 Malignant neoplasm of upper-outer quadrant of left female breast: Secondary | ICD-10-CM

## 2022-09-27 DIAGNOSIS — Z79899 Other long term (current) drug therapy: Secondary | ICD-10-CM

## 2022-09-28 ENCOUNTER — Ambulatory Visit: Payer: Self-pay

## 2022-10-04 LAB — BASIC METABOLIC PANEL
BUN: 12 (ref 4–21)
CO2: 23 — AB (ref 13–22)
Chloride: 112 — AB (ref 99–108)
Creatinine: 0.5 (ref 0.5–1.1)
Glucose: 92
Potassium: 4 mEq/L (ref 3.5–5.1)
Sodium: 141 (ref 137–147)

## 2022-10-04 LAB — CBC AND DIFFERENTIAL
HCT: 36 (ref 36–46)
Hemoglobin: 11.9 — AB (ref 12.0–16.0)
Neutrophils Absolute: 1.89
Platelets: 148 10*3/uL — AB (ref 150–400)
WBC: 3

## 2022-10-04 LAB — HEPATIC FUNCTION PANEL
ALT: 34 U/L (ref 7–35)
AST: 50 — AB (ref 13–35)
Alkaline Phosphatase: 128 — AB (ref 25–125)
Bilirubin, Total: 0.6

## 2022-10-04 LAB — COMPREHENSIVE METABOLIC PANEL
Albumin: 3.6 (ref 3.5–5.0)
Calcium: 9.1 (ref 8.7–10.7)

## 2022-10-04 LAB — CBC: RBC: 3.87 (ref 3.87–5.11)

## 2022-10-04 NOTE — Progress Notes (Signed)
[facility]  [address] [place],  [place]  [address] [phone]  Clinic Day:  10/05/2022  HISTORY OF PRESENT ILLNESS:  The patient is a 71 y.o. 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  She also had a subcarinal lymph node for which scans have shown a positive response to therapy. She comes in today to go over her CT scans to ascertain her new disease baseline after receiving 25 cycle of Enhertu. The patient claims that she tolerated her 25th cycle of treatment fairly well.  As it pertains to her metastatic breast cancer, she denies having any new symptoms or findings which concern her for overt signs of disease progression.    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  The patient received 6 cycles of Taxotere/Herceptin/Perjeta before being switched to maintenance Perjeta/Herceptin, for which she took 73 cycles of treatment.  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  This all happened in the setting of a prolonged treatment break from TDM-1.  Since recurrence was found, the patient has been on Enhertu since May 2022.  VITALS:  Blood pressure 119/68, pulse 86, temperature 98.3 F (36.8 C), temperature source Oral, resp. rate 14, height 5' 1" (1.549 m), weight 149 lb 1.6 oz (67.6 kg), SpO2 95 %.  Wt Readings from Last 3 Encounters:  10/05/22 149 lb 1.6 oz (67.6 kg)  09/18/22 149 lb 1.9 oz (67.6 kg)  09/04/22 148 lb 8 oz (67.4 kg)    Body mass index is 28.17 kg/m.  Performance status (ECOG): 1 - Symptomatic but  completely ambulatory  PHYSICAL EXAM:  Physical Exam Constitutional:      General: She is not in acute distress.    Appearance: Normal appearance. She is normal weight.  HENT:     Head: Normocephalic and atraumatic.  Eyes:     General: No scleral icterus.    Extraocular Movements: Extraocular movements intact.     Conjunctiva/sclera: Conjunctivae normal.     Pupils: Pupils are equal, round, and reactive to light.  Cardiovascular:     Rate and Rhythm: Normal rate and regular rhythm.     Pulses: Normal pulses.     Heart sounds: Normal heart sounds. No murmur heard.    No friction rub. No gallop.  Pulmonary:     Effort: Pulmonary effort is normal. No respiratory distress.     Breath sounds: Normal breath sounds.  Abdominal:     General: Bowel sounds are normal. There is no distension.     Palpations: Abdomen is soft. There is no hepatomegaly, splenomegaly or mass.     Tenderness: There is no abdominal tenderness.  Musculoskeletal:        General: Normal range of motion.     Cervical back: Normal range of motion and neck supple.     Right lower leg: No edema.     Left lower leg: No edema.  Lymphadenopathy:     Cervical: No cervical adenopathy.  Skin:    General: Skin is warm and dry.  Neurological:     General: No focal deficit present.       Mental Status: She is alert and oriented to person, place, and time. Mental status is at baseline.  Psychiatric:        Mood and Affect: Mood normal.        Behavior: Behavior normal.        Thought Content: Thought content normal.        Judgment: Judgment normal.   SCANS: CT scans of her chest/abdomen/pelvis revealed the following: FINDINGS: CT CHEST FINDINGS  Cardiovascular: Right chest port catheter. Aortic atherosclerosis. Normal heart size. No pericardial effusion.  Mediastinum/Nodes: No enlarged mediastinal, hilar, or axillary lymph nodes. Surgical clips in the left axilla. Moderate hiatal hernia with intrathoracic position  of the gastric fundus. Thyroid gland, trachea, and esophagus demonstrate no significant findings.  Lungs/Pleura: Mild dependent bibasilar scarring or atelectasis. Occasional small bilateral pulmonary nodules are unchanged, for example a 0.3 cm nodule of the right pulmonary apex (series 301, image 22), a 0.4 cm nodule of the inferior right upper lobe (series 301, image 59), and a 0.3 cm nodule of the posterior left upper lobe (series 301, image 26). No pleural effusion or pneumothorax.  Musculoskeletal: No chest wall abnormality. No acute osseous findings.  CT ABDOMEN PELVIS FINDINGS  Hepatobiliary: No solid liver abnormality is seen. Multiple simple, benign liver parenchymal cysts, for which no further follow-up or characterization is required. No gallstones, gallbladder wall thickening, or biliary dilatation.  Pancreas: Unremarkable. No pancreatic ductal dilatation or surrounding inflammatory changes.  Spleen: Normal in size without significant abnormality.  Adrenals/Urinary Tract: Adrenal glands are unremarkable. Numerous bilateral parapelvic renal cysts, benign, for which no further follow-up or characterization is required. Kidneys are otherwise normal, without renal calculi, solid lesion, or hydronephrosis. Bladder is unremarkable.  Stomach/Bowel: Stomach is within normal limits. Appendix appears normal. No evidence of bowel wall thickening, distention, or inflammatory changes. Sigmoid diverticulosis.  Vascular/Lymphatic: Scattered aortic atherosclerosis. No enlarged abdominal or pelvic lymph nodes.  Reproductive: No mass or other abnormality.  Other: Unchanged small, fat containing bilateral inguinal hernias. Unchanged 1.0 x 1.0 cm fluid attenuation lesion in the left groin (series 2, image 108). No ascites.  Musculoskeletal: No acute osseous findings.  IMPRESSION: 1. Occasional small bilateral pulmonary nodules are unchanged, most likely benign sequelae of prior  infection or inflammation. Attention on follow-up. 2. No evidence of lymphadenopathy or metastatic disease in the chest, abdomen, or pelvis. 3. Unchanged 1.0 x 1.0 cm fluid attenuation lesion in the left groin, most likely a small seroma or lymphocele. 4. Hiatal hernia. 5. Sigmoid diverticulosis without evidence of acute diverticulitis.  Aortic Atherosclerosis (ICD10-I70.0).  LABS:   ASSESSMENT & PLAN:  Assessment/Plan:  A 71 y.o. 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  She also has had subcarinal nodal disease that has resolved over time with therapy.  In clinic today, I went over all of her CT scan images with her, for which she can see there remains no radiographic evidence of disease progression while on her Enhertu therapy.  Understandably, the patient was pleased with her radiographic images.  As she continues to have a good response to her Enhertu therapy, she will proceed with her 26th cycle of Enhertu next week.  I will see her back in 3 weeks before she heads into her 27th cycle of Enhertu therapy.  The patient understands all the plans discussed today and is in agreement with them.    Llana Deshazo Macarthur Critchley, MD

## 2022-10-05 ENCOUNTER — Ambulatory Visit: Payer: Self-pay | Admitting: Oncology

## 2022-10-05 ENCOUNTER — Encounter: Payer: Self-pay | Admitting: Oncology

## 2022-10-05 ENCOUNTER — Inpatient Hospital Stay: Payer: Self-pay | Attending: Oncology | Admitting: Oncology

## 2022-10-05 DIAGNOSIS — Z79899 Other long term (current) drug therapy: Secondary | ICD-10-CM | POA: Insufficient documentation

## 2022-10-05 DIAGNOSIS — Z5112 Encounter for antineoplastic immunotherapy: Secondary | ICD-10-CM | POA: Insufficient documentation

## 2022-10-05 DIAGNOSIS — Z171 Estrogen receptor negative status [ER-]: Secondary | ICD-10-CM | POA: Insufficient documentation

## 2022-10-05 DIAGNOSIS — C7802 Secondary malignant neoplasm of left lung: Secondary | ICD-10-CM | POA: Insufficient documentation

## 2022-10-05 DIAGNOSIS — C50412 Malignant neoplasm of upper-outer quadrant of left female breast: Secondary | ICD-10-CM

## 2022-10-05 DIAGNOSIS — C50919 Malignant neoplasm of unspecified site of unspecified female breast: Secondary | ICD-10-CM | POA: Insufficient documentation

## 2022-10-05 DIAGNOSIS — C7801 Secondary malignant neoplasm of right lung: Secondary | ICD-10-CM | POA: Insufficient documentation

## 2022-10-05 DIAGNOSIS — C7931 Secondary malignant neoplasm of brain: Secondary | ICD-10-CM | POA: Insufficient documentation

## 2022-10-07 ENCOUNTER — Other Ambulatory Visit: Payer: Self-pay

## 2022-10-07 DIAGNOSIS — Z171 Estrogen receptor negative status [ER-]: Secondary | ICD-10-CM

## 2022-10-08 ENCOUNTER — Encounter: Payer: Self-pay | Admitting: Oncology

## 2022-10-08 MED FILL — Fam-Trastuzumab Deruxtecan-nxki For IV Soln 100 MG: INTRAVENOUS | Qty: 12 | Status: AC

## 2022-10-08 MED FILL — Dexamethasone Sodium Phosphate Inj 100 MG/10ML: INTRAMUSCULAR | Qty: 1 | Status: AC

## 2022-10-09 ENCOUNTER — Inpatient Hospital Stay: Payer: Self-pay

## 2022-10-09 VITALS — BP 117/64 | HR 85 | Temp 98.9°F | Resp 18 | Ht 61.0 in | Wt 151.0 lb

## 2022-10-09 DIAGNOSIS — Z171 Estrogen receptor negative status [ER-]: Secondary | ICD-10-CM

## 2022-10-09 MED ORDER — DEXTROSE 5 % IV SOLN: Freq: Once | INTRAVENOUS | Status: AC

## 2022-10-09 MED ORDER — SODIUM CHLORIDE 0.9 % IV SOLN
10.0000 mg | Freq: Once | INTRAVENOUS | Status: AC
  Administered 2022-10-09: 10 mg via INTRAVENOUS
  Filled 2022-10-09: qty 10

## 2022-10-09 MED ORDER — HEPARIN SOD (PORK) LOCK FLUSH 100 UNIT/ML IV SOLN
500.0000 [IU] | Freq: Once | INTRAVENOUS | Status: AC | PRN
  Administered 2022-10-09: 500 [IU]

## 2022-10-09 MED ORDER — DIPHENHYDRAMINE HCL 25 MG PO CAPS
50.0000 mg | ORAL_CAPSULE | Freq: Once | ORAL | Status: AC
  Administered 2022-10-09: 50 mg via ORAL
  Filled 2022-10-09: qty 2

## 2022-10-09 MED ORDER — SODIUM CHLORIDE 0.9% FLUSH
10.0000 mL | INTRAVENOUS | Status: DC | PRN
  Administered 2022-10-09: 10 mL

## 2022-10-09 MED ORDER — ACETAMINOPHEN 325 MG PO TABS
650.0000 mg | ORAL_TABLET | Freq: Once | ORAL | Status: AC
  Administered 2022-10-09: 650 mg via ORAL
  Filled 2022-10-09: qty 2

## 2022-10-09 MED ORDER — PALONOSETRON HCL INJECTION 0.25 MG/5ML
0.2500 mg | Freq: Once | INTRAVENOUS | Status: AC
  Administered 2022-10-09: 0.25 mg via INTRAVENOUS
  Filled 2022-10-09: qty 5

## 2022-10-09 MED ORDER — FAM-TRASTUZUMAB DERUXTECAN-NXKI CHEMO 100 MG IV SOLR
3.6300 mg/kg | Freq: Once | INTRAVENOUS | Status: AC
  Administered 2022-10-09: 240 mg via INTRAVENOUS
  Filled 2022-10-09: qty 12

## 2022-10-09 NOTE — Patient Instructions (Signed)
Fam-Trastuzumab Deruxtecan Injection What is this medication? FAM-TRASTUZUMAB DERUXTECAN (fam-tras TOOZ eu mab DER ux TEE kan) treats some types of cancer. It works by blocking a protein that causes cancer cells to grow and multiply. This helps to slow or stop the spread of cancer cells. This medicine may be used for other purposes; ask your health care provider or pharmacist if you have questions. COMMON BRAND NAME(S): ENHERTU What should I tell my care team before I take this medication? They need to know if you have any of these conditions: Heart disease Heart failure Infection, especially a viral infection, such as chickenpox, cold sores, or herpes Liver disease Lung or breathing disease, such as asthma or COPD An unusual or allergic reaction to fam-trastuzumab deruxtecan, other medications, foods, dyes, or preservatives Pregnant or trying to get pregnant Breast-feeding How should I use this medication? This medication is injected into a vein. It is given by your care team in a hospital or clinic setting. A special MedGuide will be given to you before each treatment. Be sure to read this information carefully each time. Talk to your care team about the use of this medication in children. Special care may be needed. Overdosage: If you think you have taken too much of this medicine contact a poison control center or emergency room at once. NOTE: This medicine is only for you. Do not share this medicine with others. What if I miss a dose? It is important not to miss your dose. Call your care team if you are unable to keep an appointment. What may interact with this medication? Interactions are not expected. This list may not describe all possible interactions. Give your health care provider a list of all the medicines, herbs, non-prescription drugs, or dietary supplements you use. Also tell them if you smoke, drink alcohol, or use illegal drugs. Some items may interact with your  medicine. What should I watch for while using this medication? Visit your care team for regular checks on your progress. Tell your care team if your symptoms do not start to get better or if they get worse. Your condition will be monitored carefully while you are receiving this medication. Do not become pregnant while taking this medication or for 7 months after stopping it. Women should inform their care team if they wish to become pregnant or think they might be pregnant. Men should not father a child while taking this medication and for 4 months after stopping it. There is potential for serious side effects to an unborn child. Talk to your care team for more information. Do not breast-feed an infant while taking this medication or for 7 months after the last dose. This medication has caused decreased sperm counts in some men. This may make it more difficult to father a child. Talk to your care team if you are concerned about your fertility. This medication may increase your risk to bruise or bleed. Call your care team if you notice any unusual bleeding. Be careful brushing or flossing your teeth or using a toothpick because you may get an infection or bleed more easily. If you have any dental work done, tell your dentist you are receiving this medication. This medication may cause dry eyes and blurred vision. If you wear contact lenses, you may feel some discomfort. Lubricating eye drops may help. See your care team if the problem does not go away or is severe. This medication may increase your risk of getting an infection. Call your care team for  advice if you get a fever, chills, sore throat, or other symptoms of a cold or flu. Do not treat yourself. Try to avoid being around people who are sick. Avoid taking medications that contain aspirin, acetaminophen, ibuprofen, naproxen, or ketoprofen unless instructed by your care team. These medications may hide a fever. What side effects may I notice from  receiving this medication? Side effects that you should report to your care team as soon as possible: Allergic reactions--skin rash, itching, hives, swelling of the face, lips, tongue, or throat Dry cough, shortness of breath or trouble breathing Infection--fever, chills, cough, sore throat, wounds that don't heal, pain or trouble when passing urine, general feeling of discomfort or being unwell Heart failure--shortness of breath, swelling of the ankles, feet, or hands, sudden weight gain, unusual weakness or fatigue Unusual bruising or bleeding Side effects that usually do not require medical attention (report these to your care team if they continue or are bothersome): Constipation Diarrhea Hair loss Muscle pain Nausea Vomiting This list may not describe all possible side effects. Call your doctor for medical advice about side effects. You may report side effects to FDA at 1-800-FDA-1088. Where should I keep my medication? This medication is given in a hospital or clinic. It will not be stored at home. NOTE: This sheet is a summary. It may not cover all possible information. If you have questions about this medicine, talk to your doctor, pharmacist, or health care provider.  2023 Elsevier/Gold Standard (2021-06-28 00:00:00)

## 2022-10-17 ENCOUNTER — Encounter: Payer: Self-pay | Admitting: Oncology

## 2022-10-24 NOTE — Progress Notes (Cosign Needed)
[facility]  [address] [place],  [place]  [address] [phone]  Clinic Day:  10/26/2022  Referring physician: Marguerita Merles, MD   HISTORY OF PRESENT ILLNESS:  The patient is a 71 y.o. 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  She also had a subcarinal lymph node for which scans have shown a positive response to therapy. CT chest, abdomen and pelvis after receiving 25 cycle of Enhertu on December 7th did not reveal any evidence of disease progression. The patient claims that she tolerated her 26th cycle of treatment fairly well.  As it pertains to her metastatic breast cancer, she denies having any new symptoms or findings which concern her for overt signs of disease progression. She reports fatigue, as well as persistent swelling and rash of the left forearm.  Previous left upper extremity ultrasound was negative.   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  The patient received 6 cycles of Taxotere/Herceptin/Perjeta before being switched to maintenance Perjeta/Herceptin, for which she took 73 cycles of treatment.  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  This all happened in the setting of a prolonged treatment break from TDM-1.  Since recurrence was found, the patient has been on Enhertu since May 2022 and her disease has remained controlled.   PHYSICAL EXAM:  Blood pressure 129/65, pulse 79, temperature 97.7 F (36.5 C), resp. rate 14, height _0  (1.549 m), weight 150 lb 8 oz (68.3 kg), SpO2 97 %. Wt  Readings from Last 3 Encounters:  10/26/22 150 lb 8 oz (68.3 kg)  10/09/22 151 lb 0.6 oz (68.5 kg)  10/05/22 149 lb 1.6 oz (67.6 kg)   Body mass index is 28.44 kg/m.  Performance status (ECOG): 1 - Symptomatic but completely ambulatory  Physical Exam Vitals and nursing note reviewed.  Constitutional:      General: She is not in acute distress.    Appearance: Normal appearance.  HENT:     Head: Normocephalic and atraumatic.     Mouth/Throat:     Mouth: Mucous membranes are moist.     Pharynx: Oropharynx is clear. No oropharyngeal exudate or posterior oropharyngeal erythema.  Eyes:     General: No scleral icterus.    Extraocular Movements: Extraocular movements intact.     Conjunctiva/sclera: Conjunctivae normal.     Pupils: Pupils are equal, round, and reactive to light.  Cardiovascular:     Rate and Rhythm: Normal rate and regular rhythm.     Heart sounds: Normal heart sounds. No murmur heard.    No friction rub. No gallop.  Pulmonary:     Effort: Pulmonary effort is normal.     Breath sounds: Normal breath sounds. No wheezing, rhonchi or rales.  Abdominal:     General: There is no distension.     Palpations: Abdomen is soft. There is no hepatomegaly, splenomegaly or mass.     Tenderness: There is no abdominal tenderness.  Musculoskeletal:        General: Normal range of motion.     Cervical back: Normal range of motion and  neck supple. No tenderness.     Right lower leg: No edema.     Left lower leg: No edema.  Lymphadenopathy:     Cervical: No cervical adenopathy.     Upper Body:     Right upper body: No supraclavicular or axillary adenopathy.     Left upper body: No supraclavicular or axillary adenopathy.     Lower Body: No right inguinal adenopathy. No left inguinal adenopathy.  Skin:    General: Skin is warm and dry.     Coloration: Skin is not jaundiced.     Findings: No rash.  Neurological:     Mental Status: She is alert and oriented to person, place, and  time.     Cranial Nerves: No cranial nerve deficit.  Psychiatric:        Mood and Affect: Mood normal.        Behavior: Behavior normal.        Thought Content: Thought content normal.     LABS:      Latest Ref Rng & Units 10/26/2022   12:00 AM 10/04/2022   10:15 AM 09/14/2022    1:07 PM  CBC  WBC  2.3     3.0     3.9   Hemoglobin 12.0 - 16.0 11.7     11.9     11.4   Hematocrit 36 - 46 35     36     35.5   Platelets 150 - 400 K/uL 135     148     138      This result is from an external source.      Latest Ref Rng & Units 10/26/2022   12:00 AM 10/04/2022   10:15 AM 09/14/2022    1:07 PM  CMP  Glucose 70 - 99 mg/dL   112   BUN 4 - _0 Creatinine 0.5 - 1.1 0.6     0.5     0.70   Sodium 137 - 147 140     141     140   Potassium 3.5 - 5.1 mEq/L 3.6     4.0     3.9   Chloride 99 - 108 110     112     110   CO2 13 - _1 Calcium 8.7 - 10.7 9.2     9.1     8.9   Total Protein 6.5 - 8.1 g/dL   7.1   Total Bilirubin 0.3 - 1.2 mg/dL   0.4   Alkaline Phos 25 - 125 116     128     115   AST 13 - 35 51     50     39   ALT 7 - 35 U/L 36     34     27      This result is from an external source.     No results found for: "CEA1", "CEA" / No results found for: "CEA1", "CEA" No results found for: "PSA1" No results found for: "PRF163" No results found for: "CAN125"  No results found for: "TOTALPROTELP", "ALBUMINELP", "A1GS", "A2GS", "BETS", "BETA2SER", "GAMS", "MSPIKE", "SPEI" Lab Results  Component Value Date   TIBC 410 06/26/2022   FERRITIN 7 (L) 06/26/2022   IRONPCTSAT 5 (L) 06/26/2022   No results found for: "LDH"  No results found for: "AFPTUMOR", "TOTALPROTELP", "ALBUMINELP", "A1GS", "A2GS", "BETS", "BETA2SER", "GAMS", "MSPIKE", "SPEI", "LDH", "CEA1", "CEA", "PSA1", "IGASERUM", "IGGSERUM", "IGMSERUM", "THGAB", "THYROGLB"  Review Flowsheet       Latest Ref Rng & Units 06/26/2022  Oncology Labs  Ferritin 11 - 307 ng/mL 7   %SAT  10.4 - 31.8 % 5      STUDIES:  No results found.    ASSESSMENT & PLAN:   Assessment/Plan:  71 y.o. 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  She also has had subcarinal nodal disease that resolved over time with therapy.  She will proceed with her 27th cycle of Enhertu next week.  I will see her back in 3 weeks before she heads into her 28th cycle of Enhertu therapy.   The patient understands all the plans discussed today and is in agreement with them.  She knows to contact our office if she develops concerns prior to her next appointment.  The entire visit was conducted via hospital interpreter.    Marvia Pickles, PA-C

## 2022-10-26 ENCOUNTER — Encounter: Payer: Self-pay | Admitting: Oncology

## 2022-10-26 ENCOUNTER — Other Ambulatory Visit: Payer: Self-pay

## 2022-10-26 ENCOUNTER — Inpatient Hospital Stay (HOSPITAL_BASED_OUTPATIENT_CLINIC_OR_DEPARTMENT_OTHER): Payer: Self-pay | Admitting: Hematology and Oncology

## 2022-10-26 ENCOUNTER — Other Ambulatory Visit: Payer: Self-pay | Admitting: Pharmacist

## 2022-10-26 ENCOUNTER — Encounter: Payer: Self-pay | Admitting: Hematology and Oncology

## 2022-10-26 ENCOUNTER — Ambulatory Visit: Payer: Self-pay | Admitting: Oncology

## 2022-10-26 ENCOUNTER — Inpatient Hospital Stay: Payer: Self-pay

## 2022-10-26 VITALS — BP 129/65 | HR 79 | Temp 97.7°F | Resp 14 | Ht 61.0 in | Wt 150.5 lb

## 2022-10-26 DIAGNOSIS — C50412 Malignant neoplasm of upper-outer quadrant of left female breast: Secondary | ICD-10-CM

## 2022-10-26 DIAGNOSIS — Z171 Estrogen receptor negative status [ER-]: Secondary | ICD-10-CM

## 2022-10-26 DIAGNOSIS — C50912 Malignant neoplasm of unspecified site of left female breast: Secondary | ICD-10-CM

## 2022-10-26 DIAGNOSIS — C7931 Secondary malignant neoplasm of brain: Secondary | ICD-10-CM

## 2022-10-26 LAB — CBC AND DIFFERENTIAL
HCT: 35 — AB (ref 36–46)
Hemoglobin: 11.7 — AB (ref 12.0–16.0)
MCV: 93 (ref 81–99)
Neutrophils Absolute: 1.45
Platelets: 135 10*3/uL — AB (ref 150–400)
WBC: 2.3

## 2022-10-26 LAB — BASIC METABOLIC PANEL
BUN: 15 (ref 4–21)
CO2: 22 (ref 13–22)
Chloride: 110 — AB (ref 99–108)
Creatinine: 0.6 (ref 0.5–1.1)
Glucose: 131
Potassium: 3.6 mEq/L (ref 3.5–5.1)
Sodium: 140 (ref 137–147)

## 2022-10-26 LAB — HEPATIC FUNCTION PANEL
ALT: 36 U/L — AB (ref 7–35)
AST: 51 — AB (ref 13–35)
Alkaline Phosphatase: 116 (ref 25–125)
Bilirubin, Total: 0.5

## 2022-10-26 LAB — CBC: RBC: 3.76 — AB (ref 3.87–5.11)

## 2022-10-26 LAB — COMPREHENSIVE METABOLIC PANEL
Albumin: 3.7 (ref 3.5–5.0)
Calcium: 9.2 (ref 8.7–10.7)

## 2022-10-26 MED FILL — Dexamethasone Sodium Phosphate Inj 100 MG/10ML: INTRAMUSCULAR | Qty: 1 | Status: AC

## 2022-10-26 MED FILL — Fam-Trastuzumab Deruxtecan-nxki For IV Soln 100 MG: INTRAVENOUS | Qty: 12 | Status: AC

## 2022-10-26 NOTE — Progress Notes (Signed)
Ok to proceed with chemo despite ANC=1450 per [facility].

## 2022-10-27 ENCOUNTER — Other Ambulatory Visit: Payer: Self-pay

## 2022-10-30 ENCOUNTER — Inpatient Hospital Stay: Payer: Self-pay | Attending: Oncology

## 2022-10-30 VITALS — BP 120/77 | HR 77 | Temp 98.0°F | Resp 20 | Ht 61.0 in | Wt 152.8 lb

## 2022-10-30 DIAGNOSIS — Z79899 Other long term (current) drug therapy: Secondary | ICD-10-CM | POA: Insufficient documentation

## 2022-10-30 DIAGNOSIS — C7802 Secondary malignant neoplasm of left lung: Secondary | ICD-10-CM | POA: Insufficient documentation

## 2022-10-30 DIAGNOSIS — Z5112 Encounter for antineoplastic immunotherapy: Secondary | ICD-10-CM | POA: Insufficient documentation

## 2022-10-30 DIAGNOSIS — Z170 Estrogen receptor positive status [ER+]: Secondary | ICD-10-CM | POA: Insufficient documentation

## 2022-10-30 DIAGNOSIS — Z171 Estrogen receptor negative status [ER-]: Secondary | ICD-10-CM

## 2022-10-30 DIAGNOSIS — C7931 Secondary malignant neoplasm of brain: Secondary | ICD-10-CM | POA: Insufficient documentation

## 2022-10-30 DIAGNOSIS — C7801 Secondary malignant neoplasm of right lung: Secondary | ICD-10-CM | POA: Insufficient documentation

## 2022-10-30 DIAGNOSIS — C50919 Malignant neoplasm of unspecified site of unspecified female breast: Secondary | ICD-10-CM | POA: Insufficient documentation

## 2022-10-30 MED ORDER — ACETAMINOPHEN 325 MG PO TABS
650.0000 mg | ORAL_TABLET | Freq: Once | ORAL | Status: AC
  Administered 2022-10-30: 650 mg via ORAL
  Filled 2022-10-30: qty 2

## 2022-10-30 MED ORDER — FAM-TRASTUZUMAB DERUXTECAN-NXKI CHEMO 100 MG IV SOLR
3.6300 mg/kg | Freq: Once | INTRAVENOUS | Status: AC
  Administered 2022-10-30: 240 mg via INTRAVENOUS
  Filled 2022-10-30: qty 12

## 2022-10-30 MED ORDER — HEPARIN SOD (PORK) LOCK FLUSH 100 UNIT/ML IV SOLN: 500.0000 [IU] | Freq: Once | INTRAVENOUS | Status: DC | PRN

## 2022-10-30 MED ORDER — PALONOSETRON HCL INJECTION 0.25 MG/5ML
0.2500 mg | Freq: Once | INTRAVENOUS | Status: AC
  Administered 2022-10-30: 0.25 mg via INTRAVENOUS
  Filled 2022-10-30: qty 5

## 2022-10-30 MED ORDER — SODIUM CHLORIDE 0.9% FLUSH: 10.0000 mL | INTRAVENOUS | Status: DC | PRN

## 2022-10-30 MED ORDER — DIPHENHYDRAMINE HCL 25 MG PO CAPS
50.0000 mg | ORAL_CAPSULE | Freq: Once | ORAL | Status: AC
  Administered 2022-10-30: 50 mg via ORAL
  Filled 2022-10-30: qty 2

## 2022-10-30 MED ORDER — DEXTROSE 5 % IV SOLN: Freq: Once | INTRAVENOUS | Status: AC

## 2022-10-30 MED ORDER — SODIUM CHLORIDE 0.9 % IV SOLN
10.0000 mg | Freq: Once | INTRAVENOUS | Status: AC
  Administered 2022-10-30: 10 mg via INTRAVENOUS
  Filled 2022-10-30: qty 10

## 2022-10-30 NOTE — Patient Instructions (Signed)
Fam-Trastuzumab Deruxtecan Injection What is this medication? FAM-TRASTUZUMAB DERUXTECAN (fam-tras TOOZ eu mab DER ux TEE kan) treats some types of cancer. It works by blocking a protein that causes cancer cells to grow and multiply. This helps to slow or stop the spread of cancer cells. This medicine may be used for other purposes; ask your health care provider or pharmacist if you have questions. COMMON BRAND NAME(S): ENHERTU What should I tell my care team before I take this medication? They need to know if you have any of these conditions: Heart disease Heart failure Infection, especially a viral infection, such as chickenpox, cold sores, or herpes Liver disease Lung or breathing disease, such as asthma or COPD An unusual or allergic reaction to fam-trastuzumab deruxtecan, other medications, foods, dyes, or preservatives Pregnant or trying to get pregnant Breast-feeding How should I use this medication? This medication is injected into a vein. It is given by your care team in a hospital or clinic setting. A special MedGuide will be given to you before each treatment. Be sure to read this information carefully each time. Talk to your care team about the use of this medication in children. Special care may be needed. Overdosage: If you think you have taken too much of this medicine contact a poison control center or emergency room at once. NOTE: This medicine is only for you. Do not share this medicine with others. What if I miss a dose? It is important not to miss your dose. Call your care team if you are unable to keep an appointment. What may interact with this medication? Interactions are not expected. This list may not describe all possible interactions. Give your health care provider a list of all the medicines, herbs, non-prescription drugs, or dietary supplements you use. Also tell them if you smoke, drink alcohol, or use illegal drugs. Some items may interact with your  medicine. What should I watch for while using this medication? Visit your care team for regular checks on your progress. Tell your care team if your symptoms do not start to get better or if they get worse. Your condition will be monitored carefully while you are receiving this medication. Do not become pregnant while taking this medication or for 7 months after stopping it. Women should inform their care team if they wish to become pregnant or think they might be pregnant. Men should not father a child while taking this medication and for 4 months after stopping it. There is potential for serious side effects to an unborn child. Talk to your care team for more information. Do not breast-feed an infant while taking this medication or for 7 months after the last dose. This medication has caused decreased sperm counts in some men. This may make it more difficult to father a child. Talk to your care team if you are concerned about your fertility. This medication may increase your risk to bruise or bleed. Call your care team if you notice any unusual bleeding. Be careful brushing or flossing your teeth or using a toothpick because you may get an infection or bleed more easily. If you have any dental work done, tell your dentist you are receiving this medication. This medication may cause dry eyes and blurred vision. If you wear contact lenses, you may feel some discomfort. Lubricating eye drops may help. See your care team if the problem does not go away or is severe. This medication may increase your risk of getting an infection. Call your care team for  advice if you get a fever, chills, sore throat, or other symptoms of a cold or flu. Do not treat yourself. Try to avoid being around people who are sick. Avoid taking medications that contain aspirin, acetaminophen, ibuprofen, naproxen, or ketoprofen unless instructed by your care team. These medications may hide a fever. What side effects may I notice from  receiving this medication? Side effects that you should report to your care team as soon as possible: Allergic reactions--skin rash, itching, hives, swelling of the face, lips, tongue, or throat Dry cough, shortness of breath or trouble breathing Infection--fever, chills, cough, sore throat, wounds that don't heal, pain or trouble when passing urine, general feeling of discomfort or being unwell Heart failure--shortness of breath, swelling of the ankles, feet, or hands, sudden weight gain, unusual weakness or fatigue Unusual bruising or bleeding Side effects that usually do not require medical attention (report these to your care team if they continue or are bothersome): Constipation Diarrhea Hair loss Muscle pain Nausea Vomiting This list may not describe all possible side effects. Call your doctor for medical advice about side effects. You may report side effects to FDA at 1-800-FDA-1088. Where should I keep my medication? This medication is given in a hospital or clinic. It will not be stored at home. NOTE: This sheet is a summary. It may not cover all possible information. If you have questions about this medicine, talk to your doctor, pharmacist, or health care provider.  2023 Elsevier/Gold Standard (2021-06-28 00:00:00)

## 2022-11-12 NOTE — Progress Notes (Signed)
Sent in request for DOS 11/16/2022 and 11/20/2022 to [employer].

## 2022-11-13 ENCOUNTER — Encounter: Payer: Self-pay | Admitting: Oncology

## 2022-11-16 ENCOUNTER — Inpatient Hospital Stay (HOSPITAL_BASED_OUTPATIENT_CLINIC_OR_DEPARTMENT_OTHER): Payer: Self-pay | Admitting: Oncology

## 2022-11-16 ENCOUNTER — Inpatient Hospital Stay: Payer: Self-pay

## 2022-11-16 ENCOUNTER — Other Ambulatory Visit: Payer: Self-pay | Admitting: Oncology

## 2022-11-16 VITALS — BP 120/69 | HR 83 | Temp 98.2°F | Resp 16 | Ht 61.0 in | Wt 149.9 lb

## 2022-11-16 DIAGNOSIS — C50412 Malignant neoplasm of upper-outer quadrant of left female breast: Secondary | ICD-10-CM

## 2022-11-16 DIAGNOSIS — Z170 Estrogen receptor positive status [ER+]: Secondary | ICD-10-CM

## 2022-11-16 LAB — CBC AND DIFFERENTIAL
HCT: 36 (ref 36–46)
Hemoglobin: 11.9 — AB (ref 12.0–16.0)
Neutrophils Absolute: 2.04
Platelets: 164 10*3/uL (ref 150–400)
WBC: 3

## 2022-11-16 LAB — CMP (CANCER CENTER ONLY)
ALT: 23 U/L (ref 0–44)
AST: 36 U/L (ref 15–41)
Albumin: 3.3 g/dL — ABNORMAL LOW (ref 3.5–5.0)
Alkaline Phosphatase: 98 U/L (ref 38–126)
Anion gap: 8 (ref 5–15)
BUN: 13 mg/dL (ref 8–23)
CO2: 23 mmol/L (ref 22–32)
Calcium: 9.4 mg/dL (ref 8.9–10.3)
Chloride: 109 mmol/L (ref 98–111)
Creatinine: 0.88 mg/dL (ref 0.44–1.00)
GFR, Estimated: >60 mL/min (ref >60)
Glucose, Bld: 100 mg/dL — ABNORMAL HIGH (ref 70–99)
Potassium: 3.6 mmol/L (ref 3.5–5.1)
Sodium: 140 mmol/L (ref 135–145)
Total Bilirubin: 0.6 mg/dL (ref 0.3–1.2)
Total Protein: 7.5 g/dL (ref 6.5–8.1)

## 2022-11-16 LAB — CBC: RBC: 3.8 — AB (ref 3.87–5.11)

## 2022-11-16 NOTE — Progress Notes (Signed)
[facility]  [address] [place],  [place]  [address] [phone]  Clinic Day:  11/16/2022  HISTORY OF PRESENT ILLNESS:  The patient is a 72 y.o. 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  She also had a subcarinal lymph node for which scans have shown a positive response to therapy. She comes in today to be evaluated before heading into her 28th cycle of Enhertu. The patient claims that she tolerated her 27th cycle of treatment fairly well.  As it pertains to her metastatic breast cancer, she denies having any new symptoms or findings which concern her for overt signs of disease progression.    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  The patient received 6 cycles of Taxotere/Herceptin/Perjeta before being switched to maintenance Perjeta/Herceptin, for which she took 73 cycles of treatment.  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  This all happened in the setting of a prolonged treatment break from TDM-1.  Since recurrence was found, the patient has been on Enhertu since May 2022.  VITALS:  Blood pressure 120/69, pulse 83, temperature 98.2 F (36.8 C), resp. rate 16, height 5\' 1"  (1.549 m), weight 149 lb 14.4 oz (68 kg), SpO2 96 %.  Wt Readings from Last 3 Encounters:  11/16/22 149 lb 14.4 oz (68 kg)  10/30/22 152 lb 12 oz (69.3 kg)  10/26/22 150 lb 8 oz (68.3 kg)    Body mass index is 28.32 kg/m.  Performance status (ECOG): 1 - Symptomatic but completely ambulatory  PHYSICAL EXAM:  Physical  Exam Constitutional:      General: She is not in acute distress.    Appearance: Normal appearance. She is normal weight.  HENT:     Head: Normocephalic and atraumatic.  Eyes:     General: No scleral icterus.    Extraocular Movements: Extraocular movements intact.     Conjunctiva/sclera: Conjunctivae normal.     Pupils: Pupils are equal, round, and reactive to light.  Cardiovascular:     Rate and Rhythm: Normal rate and regular rhythm.     Pulses: Normal pulses.     Heart sounds: Normal heart sounds. No murmur heard.    No friction rub. No gallop.  Pulmonary:     Effort: Pulmonary effort is normal. No respiratory distress.     Breath sounds: Normal breath sounds.  Abdominal:     General: Bowel sounds are normal. There is no distension.     Palpations: Abdomen is soft. There is no hepatomegaly, splenomegaly or mass.     Tenderness: There is no abdominal tenderness.  Musculoskeletal:        General: Normal range of motion.     Cervical back: Normal range of motion and neck supple.     Right lower leg: No edema.     Left lower leg: No edema.  Lymphadenopathy:     Cervical: No cervical adenopathy.  Skin:    General: Skin is warm and dry.  Neurological:     General: No focal deficit present.     Mental Status: She is alert and  oriented to person, place, and time. Mental status is at baseline.  Psychiatric:        Mood and Affect: Mood normal.        Behavior: Behavior normal.        Thought Content: Thought content normal.        Judgment: Judgment normal.    LABS:   ASSESSMENT & PLAN:  Assessment/Plan:  A 72 y.o. 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  She will proceed with her 28th cycle of Enhertu next week.  Clinically, she continues to do very well.  I will see her back in 3 weeks before she heads into her 29th cycle of Enhertu therapy.  The patient understands all the plans discussed  today and is in agreement with them.    Seymone Forlenza Kirby Funk, MD

## 2022-11-18 ENCOUNTER — Encounter: Payer: Self-pay | Admitting: Oncology

## 2022-11-18 ENCOUNTER — Other Ambulatory Visit: Payer: Self-pay

## 2022-11-20 ENCOUNTER — Inpatient Hospital Stay: Payer: Self-pay

## 2022-11-20 VITALS — BP 129/60 | HR 88 | Temp 98.0°F | Resp 18 | Ht 61.0 in | Wt 149.0 lb

## 2022-11-20 DIAGNOSIS — C50412 Malignant neoplasm of upper-outer quadrant of left female breast: Secondary | ICD-10-CM

## 2022-11-20 MED ORDER — HEPARIN SOD (PORK) LOCK FLUSH 100 UNIT/ML IV SOLN
500.0000 [IU] | Freq: Once | INTRAVENOUS | Status: AC | PRN
  Administered 2022-11-20: 500 [IU]

## 2022-11-20 MED ORDER — ACETAMINOPHEN 325 MG PO TABS
650.0000 mg | ORAL_TABLET | Freq: Once | ORAL | Status: AC
  Administered 2022-11-20: 650 mg via ORAL
  Filled 2022-11-20: qty 2

## 2022-11-20 MED ORDER — PALONOSETRON HCL INJECTION 0.25 MG/5ML
0.2500 mg | Freq: Once | INTRAVENOUS | Status: AC
  Administered 2022-11-20: 0.25 mg via INTRAVENOUS
  Filled 2022-11-20: qty 5

## 2022-11-20 MED ORDER — DEXTROSE 5 % IV SOLN: Freq: Once | INTRAVENOUS | Status: AC

## 2022-11-20 MED ORDER — FAM-TRASTUZUMAB DERUXTECAN-NXKI CHEMO 100 MG IV SOLR
3.6300 mg/kg | Freq: Once | INTRAVENOUS | Status: AC
  Administered 2022-11-20: 240 mg via INTRAVENOUS
  Filled 2022-11-20: qty 12

## 2022-11-20 MED ORDER — SODIUM CHLORIDE 0.9% FLUSH
10.0000 mL | INTRAVENOUS | Status: DC | PRN
  Administered 2022-11-20: 10 mL

## 2022-11-20 MED ORDER — DIPHENHYDRAMINE HCL 25 MG PO CAPS
50.0000 mg | ORAL_CAPSULE | Freq: Once | ORAL | Status: AC
  Administered 2022-11-20: 50 mg via ORAL
  Filled 2022-11-20: qty 2

## 2022-11-20 MED ORDER — SODIUM CHLORIDE 0.9 % IV SOLN
10.0000 mg | Freq: Once | INTRAVENOUS | Status: AC
  Administered 2022-11-20: 10 mg via INTRAVENOUS
  Filled 2022-11-20: qty 10

## 2022-11-20 NOTE — Patient Instructions (Signed)
Fam-Trastuzumab Deruxtecan Injection What is this medication? FAM-TRASTUZUMAB DERUXTECAN (fam-tras TOOZ eu mab DER ux TEE kan) treats some types of cancer. It works by blocking a protein that causes cancer cells to grow and multiply. This helps to slow or stop the spread of cancer cells. This medicine may be used for other purposes; ask your health care provider or pharmacist if you have questions. COMMON BRAND NAME(S): ENHERTU What should I tell my care team before I take this medication? They need to know if you have any of these conditions: Heart disease Heart failure Infection, especially a viral infection, such as chickenpox, cold sores, or herpes Liver disease Lung or breathing disease, such as asthma or COPD An unusual or allergic reaction to fam-trastuzumab deruxtecan, other medications, foods, dyes, or preservatives Pregnant or trying to get pregnant Breast-feeding How should I use this medication? This medication is injected into a vein. It is given by your care team in a hospital or clinic setting. A special MedGuide will be given to you before each treatment. Be sure to read this information carefully each time. Talk to your care team about the use of this medication in children. Special care may be needed. Overdosage: If you think you have taken too much of this medicine contact a poison control center or emergency room at once. NOTE: This medicine is only for you. Do not share this medicine with others. What if I miss a dose? It is important not to miss your dose. Call your care team if you are unable to keep an appointment. What may interact with this medication? Interactions are not expected. This list may not describe all possible interactions. Give your health care provider a list of all the medicines, herbs, non-prescription drugs, or dietary supplements you use. Also tell them if you smoke, drink alcohol, or use illegal drugs. Some items may interact with your  medicine. What should I watch for while using this medication? Visit your care team for regular checks on your progress. Tell your care team if your symptoms do not start to get better or if they get worse. Your condition will be monitored carefully while you are receiving this medication. Do not become pregnant while taking this medication or for 7 months after stopping it. Women should inform their care team if they wish to become pregnant or think they might be pregnant. Men should not father a child while taking this medication and for 4 months after stopping it. There is potential for serious side effects to an unborn child. Talk to your care team for more information. Do not breast-feed an infant while taking this medication or for 7 months after the last dose. This medication has caused decreased sperm counts in some men. This may make it more difficult to father a child. Talk to your care team if you are concerned about your fertility. This medication may increase your risk to bruise or bleed. Call your care team if you notice any unusual bleeding. Be careful brushing or flossing your teeth or using a toothpick because you may get an infection or bleed more easily. If you have any dental work done, tell your dentist you are receiving this medication. This medication may cause dry eyes and blurred vision. If you wear contact lenses, you may feel some discomfort. Lubricating eye drops may help. See your care team if the problem does not go away or is severe. This medication may increase your risk of getting an infection. Call your care team for  advice if you get a fever, chills, sore throat, or other symptoms of a cold or flu. Do not treat yourself. Try to avoid being around people who are sick. Avoid taking medications that contain aspirin, acetaminophen, ibuprofen, naproxen, or ketoprofen unless instructed by your care team. These medications may hide a fever. What side effects may I notice from  receiving this medication? Side effects that you should report to your care team as soon as possible: Allergic reactions--skin rash, itching, hives, swelling of the face, lips, tongue, or throat Dry cough, shortness of breath or trouble breathing Infection--fever, chills, cough, sore throat, wounds that don't heal, pain or trouble when passing urine, general feeling of discomfort or being unwell Heart failure--shortness of breath, swelling of the ankles, feet, or hands, sudden weight gain, unusual weakness or fatigue Unusual bruising or bleeding Side effects that usually do not require medical attention (report these to your care team if they continue or are bothersome): Constipation Diarrhea Hair loss Muscle pain Nausea Vomiting This list may not describe all possible side effects. Call your doctor for medical advice about side effects. You may report side effects to FDA at 1-800-FDA-1088. Where should I keep my medication? This medication is given in a hospital or clinic. It will not be stored at home. NOTE: This sheet is a summary. It may not cover all possible information. If you have questions about this medicine, talk to your doctor, pharmacist, or health care provider.  2023 Elsevier/Gold Standard (2021-06-28 00:00:00)

## 2022-12-03 NOTE — Progress Notes (Signed)
Sent in request for DOS 12/07/2022 and 12/11/2022 to [employer].

## 2022-12-06 NOTE — Progress Notes (Signed)
[facility]  [address] [place],  [place]  [address] [phone]  Clinic Day:  12/07/2022  HISTORY OF PRESENT ILLNESS:  The patient is a 72 y.o. 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  She also had a subcarinal lymph node for which scans have shown a positive response to therapy. She comes in today to be evaluated before heading into her 29th cycle of Enhertu. The patient claims that she tolerated her 28th cycle of treatment fairly well.  She has chronic, intermittent joint pains likely related to underlying arthritis.  As it pertains to her metastatic breast cancer, she denies having any new symptoms or findings which concern her for overt signs of disease progression.    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  The patient received 6 cycles of Taxotere/Herceptin/Perjeta before being switched to maintenance Perjeta/Herceptin, for which she took 73 cycles of treatment.  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  This all happened in the setting of a prolonged treatment break from TDM-1.  Since recurrence was found, the patient has been on Enhertu since May 2022.  VITALS:  Blood pressure 119/65, pulse 73, temperature 97.9 F (36.6 C), resp. rate 16, height 5\' 1"  (1.549 m), weight 152 lb (68.9 kg), SpO2 97 %.  Wt Readings from Last 3 Encounters:  12/11/22 153 lb (69.4 kg)  12/07/22 152 lb (68.9 kg)  11/20/22 149 lb (67.6 kg)    Body mass index is 28.72 kg/m.  Performance status (ECOG): 1 - Symptomatic but completely  ambulatory  PHYSICAL EXAM:  Physical Exam Constitutional:      General: She is not in acute distress.    Appearance: Normal appearance. She is normal weight.  HENT:     Head: Normocephalic and atraumatic.  Eyes:     General: No scleral icterus.    Extraocular Movements: Extraocular movements intact.     Conjunctiva/sclera: Conjunctivae normal.     Pupils: Pupils are equal, round, and reactive to light.  Cardiovascular:     Rate and Rhythm: Normal rate and regular rhythm.     Pulses: Normal pulses.     Heart sounds: Normal heart sounds. No murmur heard.    No friction rub. No gallop.  Pulmonary:     Effort: Pulmonary effort is normal. No respiratory distress.     Breath sounds: Normal breath sounds.  Abdominal:     General: Bowel sounds are normal. There is no distension.     Palpations: Abdomen is soft. There is no hepatomegaly, splenomegaly or mass.     Tenderness: There is no abdominal tenderness.  Musculoskeletal:        General: Normal range of motion.     Cervical back: Normal range of motion and neck supple.     Right lower leg: No edema.     Left lower leg: No edema.  Lymphadenopathy:     Cervical: No cervical adenopathy.  Skin:    General: Skin is warm and dry.  Neurological:     General: No focal deficit present.     Mental Status:  She is alert and oriented to person, place, and time. Mental status is at baseline.  Psychiatric:        Mood and Affect: Mood normal.        Behavior: Behavior normal.        Thought Content: Thought content normal.        Judgment: Judgment normal.    LABS:  Latest Reference Range & Units 12/07/22 15:35  WBC 4.0 - 10.5 K/uL 2.5 (L)  RBC 3.87 - 5.11 MIL/uL 3.83 (L)  Hemoglobin 12.0 - 15.0 g/dL 12.0  HCT 36.0 - 46.0 % 37.3  MCV 80.0 - 100.0 fL 97.4  MCH 26.0 - 34.0 pg 31.3  MCHC 30.0 - 36.0 g/dL 32.2  RDW 11.5 - 15.5 % 15.7 (H)  Platelets 150 - 400 K/uL 138 (L)  nRBC 0.0 - 0.2 % 0.0  Neutrophils % 60  Lymphocytes % 24   Monocytes Relative % 12  Eosinophil % 4  Basophil % 0  Immature Granulocytes % 0  NEUT# 1.7 - 7.7 K/uL 1.5 (L)  (L): Data is abnormally low (H): Data is abnormally high  Latest Reference Range & Units 12/07/22 15:35  Sodium 135 - 145 mmol/L 139  Potassium 3.5 - 5.1 mmol/L 3.9  Chloride 98 - 111 mmol/L 107  CO2 22 - 32 mmol/L 25  Glucose 70 - 99 mg/dL 99  BUN 8 - 23 mg/dL 15  Creatinine 0.44 - 1.00 mg/dL 0.83  Calcium 8.9 - 10.3 mg/dL 9.1  Anion gap 5 - 15  7  Alkaline Phosphatase 38 - 126 U/L 93  Albumin 3.5 - 5.0 g/dL 3.4 (L)  AST 15 - 41 U/L 37  ALT 0 - 44 U/L 26  Total Protein 6.5 - 8.1 g/dL 7.6  Total Bilirubin 0.3 - 1.2 mg/dL 0.7  GFR, Est Non African American >60 mL/min >60  (L): Data is abnormally low  ASSESSMENT & PLAN:  Assessment/Plan:  A 72 y.o. 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  She will proceed with her 29th cycle of Enhertu next week.  Clinically, she continues to do very well.  I will see her back in 3 weeks before she heads into her 30th cycle of Enhertu therapy.  The patient understands all the plans discussed today and is in agreement with them.    Neesha Langton Macarthur Critchley, MD

## 2022-12-07 ENCOUNTER — Inpatient Hospital Stay: Payer: Self-pay | Attending: Oncology | Admitting: Oncology

## 2022-12-07 ENCOUNTER — Inpatient Hospital Stay: Payer: Self-pay

## 2022-12-07 DIAGNOSIS — C770 Secondary and unspecified malignant neoplasm of lymph nodes of head, face and neck: Secondary | ICD-10-CM | POA: Insufficient documentation

## 2022-12-07 DIAGNOSIS — Z79899 Other long term (current) drug therapy: Secondary | ICD-10-CM | POA: Insufficient documentation

## 2022-12-07 DIAGNOSIS — Z170 Estrogen receptor positive status [ER+]: Secondary | ICD-10-CM | POA: Insufficient documentation

## 2022-12-07 DIAGNOSIS — Z171 Estrogen receptor negative status [ER-]: Secondary | ICD-10-CM

## 2022-12-07 DIAGNOSIS — C50412 Malignant neoplasm of upper-outer quadrant of left female breast: Secondary | ICD-10-CM

## 2022-12-07 DIAGNOSIS — Z5112 Encounter for antineoplastic immunotherapy: Secondary | ICD-10-CM | POA: Insufficient documentation

## 2022-12-07 DIAGNOSIS — C7931 Secondary malignant neoplasm of brain: Secondary | ICD-10-CM | POA: Insufficient documentation

## 2022-12-07 DIAGNOSIS — C50919 Malignant neoplasm of unspecified site of unspecified female breast: Secondary | ICD-10-CM | POA: Insufficient documentation

## 2022-12-07 LAB — CBC WITH DIFFERENTIAL (CANCER CENTER ONLY)
Abs Immature Granulocytes: 0.01 10*3/uL (ref 0.00–0.07)
Basophils Absolute: 0 10*3/uL (ref 0.0–0.1)
Basophils Relative: 0 %
Eosinophils Absolute: 0.1 10*3/uL (ref 0.0–0.5)
Eosinophils Relative: 4 %
HCT: 37.3 % (ref 36.0–46.0)
Hemoglobin: 12 g/dL (ref 12.0–15.0)
Immature Granulocytes: 0 %
Lymphocytes Relative: 24 %
Lymphs Abs: 0.6 10*3/uL — ABNORMAL LOW (ref 0.7–4.0)
MCH: 31.3 pg (ref 26.0–34.0)
MCHC: 32.2 g/dL (ref 30.0–36.0)
MCV: 97.4 fL (ref 80.0–100.0)
Monocytes Absolute: 0.3 10*3/uL (ref 0.1–1.0)
Monocytes Relative: 12 %
Neutro Abs: 1.5 10*3/uL — ABNORMAL LOW (ref 1.7–7.7)
Neutrophils Relative %: 60 %
Platelet Count: 138 10*3/uL — ABNORMAL LOW (ref 150–400)
RBC: 3.83 MIL/uL — ABNORMAL LOW (ref 3.87–5.11)
RDW: 15.7 % — ABNORMAL HIGH (ref 11.5–15.5)
WBC Count: 2.5 10*3/uL — ABNORMAL LOW (ref 4.0–10.5)
nRBC: 0 % (ref 0.0–0.2)

## 2022-12-07 LAB — CMP (CANCER CENTER ONLY)
ALT: 26 U/L (ref 0–44)
AST: 37 U/L (ref 15–41)
Albumin: 3.4 g/dL — ABNORMAL LOW (ref 3.5–5.0)
Alkaline Phosphatase: 93 U/L (ref 38–126)
Anion gap: 7 (ref 5–15)
BUN: 15 mg/dL (ref 8–23)
CO2: 25 mmol/L (ref 22–32)
Calcium: 9.1 mg/dL (ref 8.9–10.3)
Chloride: 107 mmol/L (ref 98–111)
Creatinine: 0.83 mg/dL (ref 0.44–1.00)
GFR, Estimated: >60 mL/min (ref >60)
Glucose, Bld: 99 mg/dL (ref 70–99)
Potassium: 3.9 mmol/L (ref 3.5–5.1)
Sodium: 139 mmol/L (ref 135–145)
Total Bilirubin: 0.7 mg/dL (ref 0.3–1.2)
Total Protein: 7.6 g/dL (ref 6.5–8.1)

## 2022-12-10 ENCOUNTER — Encounter: Payer: Self-pay | Admitting: Oncology

## 2022-12-10 MED FILL — Dexamethasone Sodium Phosphate Inj 100 MG/10ML: INTRAMUSCULAR | Qty: 1 | Status: AC

## 2022-12-11 ENCOUNTER — Inpatient Hospital Stay: Payer: Self-pay

## 2022-12-11 VITALS — BP 111/77 | HR 83 | Temp 98.0°F | Resp 18 | Ht 61.0 in | Wt 153.0 lb

## 2022-12-11 DIAGNOSIS — Z171 Estrogen receptor negative status [ER-]: Secondary | ICD-10-CM

## 2022-12-11 MED ORDER — SODIUM CHLORIDE 0.9 % IV SOLN
10.0000 mg | Freq: Once | INTRAVENOUS | Status: AC
  Administered 2022-12-11: 10 mg via INTRAVENOUS
  Filled 2022-12-11: qty 10

## 2022-12-11 MED ORDER — PALONOSETRON HCL INJECTION 0.25 MG/5ML
0.2500 mg | Freq: Once | INTRAVENOUS | Status: AC
  Administered 2022-12-11: 0.25 mg via INTRAVENOUS
  Filled 2022-12-11: qty 5

## 2022-12-11 MED ORDER — SODIUM CHLORIDE 0.9% FLUSH: 10.0000 mL | INTRAVENOUS | Status: DC | PRN

## 2022-12-11 MED ORDER — DEXTROSE 5 % IV SOLN: Freq: Once | INTRAVENOUS | Status: AC

## 2022-12-11 MED ORDER — DIPHENHYDRAMINE HCL 25 MG PO CAPS
50.0000 mg | ORAL_CAPSULE | Freq: Once | ORAL | Status: AC
  Administered 2022-12-11: 50 mg via ORAL
  Filled 2022-12-11: qty 2

## 2022-12-11 MED ORDER — HEPARIN SOD (PORK) LOCK FLUSH 100 UNIT/ML IV SOLN: 500.0000 [IU] | Freq: Once | INTRAVENOUS | Status: DC | PRN

## 2022-12-11 MED ORDER — FAM-TRASTUZUMAB DERUXTECAN-NXKI CHEMO 100 MG IV SOLR
3.6300 mg/kg | Freq: Once | INTRAVENOUS | Status: AC
  Administered 2022-12-11: 240 mg via INTRAVENOUS
  Filled 2022-12-11: qty 12

## 2022-12-11 MED ORDER — ACETAMINOPHEN 325 MG PO TABS
650.0000 mg | ORAL_TABLET | Freq: Once | ORAL | Status: AC
  Administered 2022-12-11: 650 mg via ORAL
  Filled 2022-12-11: qty 2

## 2022-12-11 NOTE — Patient Instructions (Signed)
Fam-Trastuzumab Deruxtecan Injection What is this medication? FAM-TRASTUZUMAB DERUXTECAN (fam-tras TOOZ eu mab DER ux TEE kan) treats some types of cancer. It works by blocking a protein that causes cancer cells to grow and multiply. This helps to slow or stop the spread of cancer cells. This medicine may be used for other purposes; ask your health care provider or pharmacist if you have questions. COMMON BRAND NAME(S): ENHERTU What should I tell my care team before I take this medication? They need to know if you have any of these conditions: Heart disease Heart failure Infection, especially a viral infection, such as chickenpox, cold sores, or herpes Liver disease Lung or breathing disease, such as asthma or COPD An unusual or allergic reaction to fam-trastuzumab deruxtecan, other medications, foods, dyes, or preservatives Pregnant or trying to get pregnant Breast-feeding How should I use this medication? This medication is injected into a vein. It is given by your care team in a hospital or clinic setting. A special MedGuide will be given to you before each treatment. Be sure to read this information carefully each time. Talk to your care team about the use of this medication in children. Special care may be needed. Overdosage: If you think you have taken too much of this medicine contact a poison control center or emergency room at once. NOTE: This medicine is only for you. Do not share this medicine with others. What if I miss a dose? It is important not to miss your dose. Call your care team if you are unable to keep an appointment. What may interact with this medication? Interactions are not expected. This list may not describe all possible interactions. Give your health care provider a list of all the medicines, herbs, non-prescription drugs, or dietary supplements you use. Also tell them if you smoke, drink alcohol, or use illegal drugs. Some items may interact with your  medicine. What should I watch for while using this medication? Visit your care team for regular checks on your progress. Tell your care team if your symptoms do not start to get better or if they get worse. Your condition will be monitored carefully while you are receiving this medication. Do not become pregnant while taking this medication or for 7 months after stopping it. Women should inform their care team if they wish to become pregnant or think they might be pregnant. Men should not father a child while taking this medication and for 4 months after stopping it. There is potential for serious side effects to an unborn child. Talk to your care team for more information. Do not breast-feed an infant while taking this medication or for 7 months after the last dose. This medication has caused decreased sperm counts in some men. This may make it more difficult to father a child. Talk to your care team if you are concerned about your fertility. This medication may increase your risk to bruise or bleed. Call your care team if you notice any unusual bleeding. Be careful brushing or flossing your teeth or using a toothpick because you may get an infection or bleed more easily. If you have any dental work done, tell your dentist you are receiving this medication. This medication may cause dry eyes and blurred vision. If you wear contact lenses, you may feel some discomfort. Lubricating eye drops may help. See your care team if the problem does not go away or is severe. This medication may increase your risk of getting an infection. Call your care team for  advice if you get a fever, chills, sore throat, or other symptoms of a cold or flu. Do not treat yourself. Try to avoid being around people who are sick. Avoid taking medications that contain aspirin, acetaminophen, ibuprofen, naproxen, or ketoprofen unless instructed by your care team. These medications may hide a fever. What side effects may I notice from  receiving this medication? Side effects that you should report to your care team as soon as possible: Allergic reactions--skin rash, itching, hives, swelling of the face, lips, tongue, or throat Dry cough, shortness of breath or trouble breathing Infection--fever, chills, cough, sore throat, wounds that don't heal, pain or trouble when passing urine, general feeling of discomfort or being unwell Heart failure--shortness of breath, swelling of the ankles, feet, or hands, sudden weight gain, unusual weakness or fatigue Unusual bruising or bleeding Side effects that usually do not require medical attention (report these to your care team if they continue or are bothersome): Constipation Diarrhea Hair loss Muscle pain Nausea Vomiting This list may not describe all possible side effects. Call your doctor for medical advice about side effects. You may report side effects to FDA at 1-800-FDA-1088. Where should I keep my medication? This medication is given in a hospital or clinic. It will not be stored at home. NOTE: This sheet is a summary. It may not cover all possible information. If you have questions about this medicine, talk to your doctor, pharmacist, or health care provider.  2023 Elsevier/Gold Standard (2021-06-28 00:00:00)

## 2022-12-14 ENCOUNTER — Encounter: Payer: Self-pay | Admitting: Oncology

## 2022-12-18 ENCOUNTER — Encounter: Payer: Self-pay | Admitting: Oncology

## 2022-12-20 ENCOUNTER — Encounter: Payer: Self-pay | Admitting: Oncology

## 2022-12-21 ENCOUNTER — Ambulatory Visit: Payer: Self-pay | Attending: Oncology

## 2022-12-24 NOTE — Progress Notes (Signed)
Sent in request for DOS 12/28/2022 and 01/01/2023 to [employer].

## 2022-12-28 ENCOUNTER — Encounter: Payer: Self-pay | Admitting: Oncology

## 2022-12-28 ENCOUNTER — Inpatient Hospital Stay: Payer: Self-pay

## 2022-12-28 ENCOUNTER — Other Ambulatory Visit: Payer: Self-pay

## 2022-12-28 ENCOUNTER — Inpatient Hospital Stay: Payer: Self-pay | Attending: Oncology | Admitting: Oncology

## 2022-12-28 ENCOUNTER — Other Ambulatory Visit: Payer: Self-pay | Admitting: Oncology

## 2022-12-28 VITALS — BP 138/76 | HR 64 | Temp 97.7°F | Resp 14 | Ht 61.0 in | Wt 150.7 lb

## 2022-12-28 DIAGNOSIS — Z170 Estrogen receptor positive status [ER+]: Secondary | ICD-10-CM | POA: Insufficient documentation

## 2022-12-28 DIAGNOSIS — C7931 Secondary malignant neoplasm of brain: Secondary | ICD-10-CM | POA: Insufficient documentation

## 2022-12-28 DIAGNOSIS — C50919 Malignant neoplasm of unspecified site of unspecified female breast: Secondary | ICD-10-CM | POA: Insufficient documentation

## 2022-12-28 DIAGNOSIS — Z5112 Encounter for antineoplastic immunotherapy: Secondary | ICD-10-CM | POA: Insufficient documentation

## 2022-12-28 DIAGNOSIS — Z171 Estrogen receptor negative status [ER-]: Secondary | ICD-10-CM

## 2022-12-28 DIAGNOSIS — C771 Secondary and unspecified malignant neoplasm of intrathoracic lymph nodes: Secondary | ICD-10-CM | POA: Insufficient documentation

## 2022-12-28 DIAGNOSIS — C50412 Malignant neoplasm of upper-outer quadrant of left female breast: Secondary | ICD-10-CM

## 2022-12-28 LAB — CMP (CANCER CENTER ONLY)
ALT: 31 U/L (ref 0–44)
AST: 45 U/L — ABNORMAL HIGH (ref 15–41)
Albumin: 3.6 g/dL (ref 3.5–5.0)
Alkaline Phosphatase: 106 U/L (ref 38–126)
Anion gap: 9 (ref 5–15)
BUN: 13 mg/dL (ref 8–23)
CO2: 22 mmol/L (ref 22–32)
Calcium: 9.5 mg/dL (ref 8.9–10.3)
Chloride: 109 mmol/L (ref 98–111)
Creatinine: 0.67 mg/dL (ref 0.44–1.00)
GFR, Estimated: >60 mL/min (ref >60)
Glucose, Bld: 87 mg/dL (ref 70–99)
Potassium: 3.7 mmol/L (ref 3.5–5.1)
Sodium: 140 mmol/L (ref 135–145)
Total Bilirubin: 0.5 mg/dL (ref 0.3–1.2)
Total Protein: 7.8 g/dL (ref 6.5–8.1)

## 2022-12-28 LAB — CBC: RBC: 4.04 (ref 3.87–5.11)

## 2022-12-28 LAB — CBC AND DIFFERENTIAL
HCT: 38 (ref 36–46)
Hemoglobin: 12.7 (ref 12.0–16.0)
Neutrophils Absolute: 2.39
Platelets: 141 10*3/uL — AB (ref 150–400)
WBC: 3.8

## 2022-12-28 NOTE — Progress Notes (Signed)
[facility]  [address] [place],  [place]  [address] [phone]  Clinic Day: 12/28/2022  HISTORY OF PRESENT ILLNESS:  The patient is a 72 y.o. 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  She also had a subcarinal lymph node for which scans have shown a positive response to therapy. She comes in today to be evaluated before heading into her 30th cycle of Enhertu. The patient claims that she tolerated her 29th cycle of treatment fairly well.  She has chronic, intermittent joint pains likely related to underlying arthritis.  As it pertains to her metastatic breast cancer, she denies having any new symptoms or findings which concern her for overt signs of disease progression.    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  The patient received 6 cycles of Taxotere/Herceptin/Perjeta before being switched to maintenance Perjeta/Herceptin, for which she took 73 cycles of treatment.  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  This all happened in the setting of a prolonged treatment break from TDM-1.  Since recurrence was found, the patient has been on Enhertu since May 2022.  VITALS:  Blood pressure 138/76, pulse 64, temperature 97.7 F (36.5 C), resp. rate 14, height '5\' 1"'$  (1.549 m), weight 150 lb 11.2 oz (68.4 kg), SpO2 96 %.  Wt Readings from Last 3 Encounters:  12/28/22 150 lb 11.2 oz (68.4 kg)  12/11/22 153 lb (69.4 kg)  12/07/22 152 lb (68.9 kg)    Body mass index is 28.47 kg/m.  Performance status (ECOG): 1 - Symptomatic  but completely ambulatory  PHYSICAL EXAM:  Physical Exam Constitutional:      General: She is not in acute distress.    Appearance: Normal appearance. She is normal weight.  HENT:     Head: Normocephalic and atraumatic.  Eyes:     General: No scleral icterus.    Extraocular Movements: Extraocular movements intact.     Conjunctiva/sclera: Conjunctivae normal.     Pupils: Pupils are equal, round, and reactive to light.  Cardiovascular:     Rate and Rhythm: Normal rate and regular rhythm.     Pulses: Normal pulses.     Heart sounds: Normal heart sounds. No murmur heard.    No friction rub. No gallop.  Pulmonary:     Effort: Pulmonary effort is normal. No respiratory distress.     Breath sounds: Normal breath sounds.  Abdominal:     General: Bowel sounds are normal. There is no distension.     Palpations: Abdomen is soft. There is no hepatomegaly, splenomegaly or mass.     Tenderness: There is no abdominal tenderness.  Musculoskeletal:        General: Normal range of motion.     Cervical back: Normal range of motion and neck supple.     Right lower leg: No edema.     Left lower leg: No edema.  Lymphadenopathy:     Cervical: No cervical adenopathy.  Skin:    General: Skin is warm and dry.  Neurological:     General: No focal deficit present.  Mental Status: She is alert and oriented to person, place, and time. Mental status is at baseline.  Psychiatric:        Mood and Affect: Mood normal.        Behavior: Behavior normal.        Thought Content: Thought content normal.        Judgment: Judgment normal.   LABS:  Latest Reference Range & Units 12/28/22 00:00  WBC  3.8 (E)  RBC 3.87 - 5.11  4.04 (E)  Hemoglobin 12.0 - 16.0  12.7 (E)  HCT 36 - 46  38 (E)  Platelets 150 - 400 K/uL 141 ! (E)  NEUT#  2.39 (E)  !: Data is abnormal (E): External lab result  ASSESSMENT & PLAN:  Assessment/Plan:  A 72 y.o. female with metastatic HER2 Neu receptor positive breast cancer,  status post a suboccipital brain metastatecomy in April 2022.  She will proceed with her 30th cycle of Enhertu next week.  Clinically, she continues to do very well.  I will see her back in 3 weeks before she heads into her 31st cycle of Enhertu therapy.  CT scans will be done a day before her next visit to ascertain her new disease baseline after 30 cycles of Enhertu.  The patient understands all the plans discussed today and is in agreement with them.    Teresa Wenk Macarthur Critchley, MD

## 2022-12-29 ENCOUNTER — Other Ambulatory Visit: Payer: Self-pay

## 2022-12-31 ENCOUNTER — Encounter: Payer: Self-pay | Admitting: Oncology

## 2022-12-31 MED FILL — Dexamethasone Sodium Phosphate Inj 100 MG/10ML: INTRAMUSCULAR | Qty: 1 | Status: AC

## 2023-01-01 ENCOUNTER — Inpatient Hospital Stay: Payer: Self-pay

## 2023-01-01 VITALS — BP 123/72 | HR 73 | Temp 98.0°F | Resp 18 | Ht 61.0 in | Wt 154.0 lb

## 2023-01-01 DIAGNOSIS — C50412 Malignant neoplasm of upper-outer quadrant of left female breast: Secondary | ICD-10-CM

## 2023-01-01 MED ORDER — DIPHENHYDRAMINE HCL 25 MG PO CAPS
50.0000 mg | ORAL_CAPSULE | Freq: Once | ORAL | Status: AC
  Administered 2023-01-01: 50 mg via ORAL
  Filled 2023-01-01: qty 2

## 2023-01-01 MED ORDER — SODIUM CHLORIDE 0.9% FLUSH
10.0000 mL | INTRAVENOUS | Status: DC | PRN
  Administered 2023-01-01: 10 mL

## 2023-01-01 MED ORDER — DEXTROSE 5 % IV SOLN: Freq: Once | INTRAVENOUS | Status: AC

## 2023-01-01 MED ORDER — ACETAMINOPHEN 325 MG PO TABS
650.0000 mg | ORAL_TABLET | Freq: Once | ORAL | Status: AC
  Administered 2023-01-01: 650 mg via ORAL
  Filled 2023-01-01: qty 2

## 2023-01-01 MED ORDER — SODIUM CHLORIDE 0.9 % IV SOLN
10.0000 mg | Freq: Once | INTRAVENOUS | Status: AC
  Administered 2023-01-01: 10 mg via INTRAVENOUS
  Filled 2023-01-01: qty 10

## 2023-01-01 MED ORDER — PALONOSETRON HCL INJECTION 0.25 MG/5ML
0.2500 mg | Freq: Once | INTRAVENOUS | Status: AC
  Administered 2023-01-01: 0.25 mg via INTRAVENOUS
  Filled 2023-01-01: qty 5

## 2023-01-01 MED ORDER — HEPARIN SOD (PORK) LOCK FLUSH 100 UNIT/ML IV SOLN
500.0000 [IU] | Freq: Once | INTRAVENOUS | Status: AC | PRN
  Administered 2023-01-01: 500 [IU]

## 2023-01-01 MED ORDER — FAM-TRASTUZUMAB DERUXTECAN-NXKI CHEMO 100 MG IV SOLR
3.6300 mg/kg | Freq: Once | INTRAVENOUS | Status: AC
  Administered 2023-01-01: 240 mg via INTRAVENOUS
  Filled 2023-01-01: qty 12

## 2023-01-01 NOTE — Patient Instructions (Signed)
Fam-Trastuzumab Deruxtecan Injection Qu es este medicamento? El FAM-TRASTUZUMAB DERUXTECAN trata algunos tipos de cncer. Acta bloqueando una protena que hace que las clulas cancerosas crezcan y se multipliquen. Esto ayuda a Music therapist propagacin de las clulas cancerosas. Este medicamento puede ser utilizado para otros usos; si tiene alguna pregunta consulte con su proveedor de atencin mdica o con su farmacutico. MARCAS COMUNES: ENHERTU Qu le debo informar a mi profesional de la salud antes de tomar este medicamento? Necesitan saber si usted presenta alguno de los [employer] o situaciones: Enfermedad cardiaca Insuficiencia cardiaca Infeccin, especialmente infecciones virales, tales como varicela, fuegos labiales o herpes Enfermedad heptica Enfermedad pulmonar o respiratoria, tales como asma o EPOC Una reaccin alrgica o inusual al fam-trastuzumab deruxtecan, a otros medicamentos, alimentos, colorantes o conservantes Si est embarazada o buscando quedar embarazada Si est amamantando a un beb Cmo debo utilizar este medicamento? Este medicamento se inyecta en una vena. Su equipo de atencin lo [place] en un hospital o en un entorno clnico. Se le entregar una Gua del medicamento (MedGuide, su nombre en ingls) especial antes de cada tratamiento. Asegrese de leer esta informacin cada vez cuidadosamente. Hable con su equipo de atencin sobre el uso de este medicamento en nios. Puede requerir atencin especial. Sobredosis: Pngase en contacto inmediatamente con un centro toxicolgico o una sala de urgencia si usted cree que haya tomado demasiado medicamento. ATENCIN: [employer] es solo para usted. No comparta este medicamento con nadie. Qu sucede si me olvido de una dosis? Es importante no olvidar ninguna dosis. Llame a su equipo de atencin si no puede asistir a una cita. Qu puede interactuar con este medicamento? No se anticipan  interacciones. Puede ser que esta lista no menciona todas las posibles interacciones. Informe a su profesional de [employer]	[place] de [employer] productos a base de hierbas, medicamentos de [place] o suplementos nutritivos que est tomando. Si usted fuma, consume bebidas alcohlicas o si utiliza drogas ilegales, indqueselo tambin a su profesional de [employer]	[place]. Algunas sustancias pueden interactuar con su medicamento. A qu debo estar atento al usar Coca[employer]Cola? Visite a su equipo de atencin para que revise su evolucin peridicamente. Si los sntomas no comienzan a mejorar o si empeoran, consulte con su equipo de atencin. Se supervisar su estado de salud atentamente mientras reciba este medicamento. No debe quedar embarazada mientras est usando este medicamento o por 7 meses despus de dejar de usarlo. Las mujeres deben informar a su equipo de atencin si estn buscando quedar embarazadas o si creen que podran estar embarazadas. Los hombres no deben Social worker a Social research officer, government estn recibiendo Coca-Cola y [place] 4 meses despus de dejar de usarlo. Existe la posibilidad de que ocurran efectos secundarios graves en un beb sin nacer. Para obtener ms informacin, hable con su equipo de atencin. No debe amamantar a un beb mientras est usando este medicamento o durante 7 meses despus de la ltima dosis. Este medicamento ha causado recuentos de esperma reducidos en algunos hombres. Esto puede hacer ms difcil que un hombre embarace a Musician. Hable con su equipo de atencin si le preocupa su fertilidad. Este medicamento podra aumentar el riesgo de moretones o sangrado. Llame a su equipo de atencin si observa sangrados inusuales. Proceda con cuidado al cepillar sus dientes, usar hilo dental o Risk manager palillos para los dientes, ya que podra contraer una infeccin o Therapist, art con mayor facilidad. Si recibe algn tratamiento dental, informe a su dentista que est [employer]  medicamento. Este medicamento puede resecarle los ojos y provocar visin borrosa. Si [place] lentes de contacto, puede sentir ciertas molestias. Las gotas lubricantes para los ojos pueden ser tiles. Si el problema no desaparece o es grave, consulte a su equipo de atencin. Este medicamento puede aumentar su riesgo de contraer una infeccin. Llame a su equipo de atencin si tiene fiebre, escalofros, dolor de garganta o cualquier otro sntoma de resfriado o gripe. No se trate usted mismo. Trate de no acercarse a personas que estn enfermas. Evite usar [employer] contienen aspirina, acetaminofeno, ibuprofeno, naproxeno o ketoprofeno, a menos que as lo indique su equipo de atencin. Estos medicamentos pueden ocultar la fiebre. Qu efectos secundarios puedo tener al [employer] este medicamento? Efectos secundarios que debe informar a su equipo de atencin tan pronto como sea posible: Reacciones alrgicas: erupcin cutnea, comezn/picazn, urticaria, hinchazn de la cara, los labios, la lengua o la garganta Tos seca, falta de aire o problemas para respirar Infeccin: fiebre, escalofros, tos, dolor de garganta, heridas que no sanan, dolor o problemas para Garment/textile technologist, sensacin general de molestia o Physicist, medical cardiaca: falta de aire, hinchazn de los tobillos, los pies o las [place], aumento de peso repentino, debilidad o fatiga inusuales Sangrado o moretones inusuales Efectos secundarios que generalmente no requieren atencin mdica (debe informarlos a su equipo de atencin si persisten o si son molestos): Estreimiento Diarrea Cada del cabello Dolor muscular Nuseas Vmito Puede ser que esta lista no menciona todos los posibles efectos secundarios. Comunquese a su mdico por asesoramiento mdico [employer]. Usted puede informar los efectos secundarios a la FDA por telfono al 1-800-FDA-1088. Dnde debo guardar mi medicina? Este medicamento se administra en hospitales  o clnicas. No se guarda en su casa. ATENCIN: Este folleto es un resumen. Puede ser que no cubra toda la posible informacin. Si usted tiene preguntas acerca de esta medicina, consulte con su mdico, su farmacutico o su profesional de Technical sales engineer.  2023 Elsevier/Gold Standard (2021-09-06 00:00:00)

## 2023-01-02 ENCOUNTER — Other Ambulatory Visit: Payer: Self-pay

## 2023-01-04 ENCOUNTER — Ambulatory Visit: Payer: Self-pay | Attending: Oncology

## 2023-01-04 DIAGNOSIS — Z171 Estrogen receptor negative status [ER-]: Secondary | ICD-10-CM

## 2023-01-04 DIAGNOSIS — C50412 Malignant neoplasm of upper-outer quadrant of left female breast: Secondary | ICD-10-CM

## 2023-01-04 LAB — ECHOCARDIOGRAM COMPLETE
Area-P 1/2: 4.36 cm2
S' Lateral: 2.4 cm

## 2023-01-16 NOTE — Progress Notes (Signed)
Sent in request for DOS 01/17/2023, 01/18/2023, and 01/22/2023 to [employer].

## 2023-01-17 LAB — CBC AND DIFFERENTIAL
HCT: 34 — AB (ref 36–46)
Hemoglobin: 11.1 — AB (ref 12.0–16.0)
Neutrophils Absolute: 1.57
Platelets: 147 10*3/uL — AB (ref 150–400)
WBC: 2.8

## 2023-01-17 LAB — BASIC METABOLIC PANEL
BUN: 14 (ref 4–21)
CO2: 22 (ref 13–22)
Chloride: 111 — AB (ref 99–108)
Creatinine: 0.5 (ref 0.5–1.1)
Glucose: 71
Potassium: 4 mEq/L (ref 3.5–5.1)
Sodium: 141 (ref 137–147)

## 2023-01-17 LAB — CBC: RBC: 3.57 — AB (ref 3.87–5.11)

## 2023-01-17 LAB — HEPATIC FUNCTION PANEL
ALT: 28 U/L (ref 7–35)
AST: 44 — AB (ref 13–35)
Alkaline Phosphatase: 90 (ref 25–125)
Bilirubin, Total: 0.5

## 2023-01-17 LAB — COMPREHENSIVE METABOLIC PANEL
Albumin: 3.7 (ref 3.5–5.0)
Calcium: 9.2 (ref 8.7–10.7)

## 2023-01-17 NOTE — Progress Notes (Signed)
[facility]  [address] [place],  [place]  [address] [phone]  Clinic Day:  01/18/2023  HISTORY OF PRESENT ILLNESS:  The patient is a 72 y.o. 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  She also had a subcarinal lymph node for which scans have shown a positive response to therapy. She comes in today to go over her CT scans to ascertain her new disease baseline after receiving 30 cycles of Enhertu. The patient claims that she tolerated her 30th cycle of treatment fairly well.  She has chronic, intermittent joint pains likely related to underlying arthritis.  As it pertains to her metastatic breast cancer, she denies having any new symptoms or findings which concern her for overt signs of disease progression.    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  The patient received 6 cycles of Taxotere/Herceptin/Perjeta before being switched to maintenance Perjeta/Herceptin, for which she took 73 cycles of treatment.  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  This all happened in the setting of a prolonged treatment break from TDM-1.  Since recurrence was found, the patient has been on Enhertu since May 2022.  VITALS:  Blood pressure 133/68, pulse 79, temperature 98.3 F (36.8 C), resp. rate 18, height 5\' 1"  (1.549 m), weight 152 lb 12.8 oz (69.3 kg), SpO2 96 %.  Wt Readings from Last 3 Encounters:  01/18/23 152 lb 12.8 oz (69.3 kg)  01/01/23 154 lb (69.9 kg)  12/28/22 150 lb 11.2 oz (68.4 kg)    Body mass index is 28.87  kg/m.  Performance status (ECOG): 1 - Symptomatic but completely ambulatory  PHYSICAL EXAM:  Physical Exam Constitutional:      General: She is not in acute distress.    Appearance: Normal appearance. She is normal weight.  HENT:     Head: Normocephalic and atraumatic.  Eyes:     General: No scleral icterus.    Extraocular Movements: Extraocular movements intact.     Conjunctiva/sclera: Conjunctivae normal.     Pupils: Pupils are equal, round, and reactive to light.  Cardiovascular:     Rate and Rhythm: Normal rate and regular rhythm.     Pulses: Normal pulses.     Heart sounds: Normal heart sounds. No murmur heard.    No friction rub. No gallop.  Pulmonary:     Effort: Pulmonary effort is normal. No respiratory distress.     Breath sounds: Normal breath sounds.  Abdominal:     General: Bowel sounds are normal. There is no distension.     Palpations: Abdomen is soft. There is no hepatomegaly, splenomegaly or mass.     Tenderness: There is no abdominal tenderness.  Musculoskeletal:        General: Normal range of motion.     Cervical back: Normal range of motion and neck supple.     Right lower leg: No edema.     Left lower leg: No edema.  Lymphadenopathy:     Cervical: No cervical adenopathy.  Skin:    General: Skin is warm and dry.  Neurological:  General: No focal deficit present.     Mental Status: She is alert and oriented to person, place, and time. Mental status is at baseline.  Psychiatric:        Mood and Affect: Mood normal.        Behavior: Behavior normal.        Thought Content: Thought content normal.        Judgment: Judgment normal.   SCANS:  CT scans of her chest/abdomen/pelvis revealed the following: FINDINGS: CT CHEST FINDINGS  Cardiovascular: Accessed right chest Port-A-Cath with tip at the superior cavoatrial junction. Aortic atherosclerosis. Normal size heart. No significant pericardial effusion/thickening..  Mediastinum/Nodes: No  suspicious thyroid nodule.  No pathologically enlarged mediastinal, hilar or axillary lymph nodes. Prior left axillary lymph node dissection. Moderate size hiatal hernia.  Lungs/Pleura: No significant interval change in the scattered tiny bilateral pulmonary nodules. No new suspicious pulmonary nodules or masses. Previously indexed nodules are as follows:  -nodule in the right lung apex measures 3 mm on image 19/301, unchanged.  -Inferior right upper lobe pulmonary nodule measures 4 mm on image 51/301.  -posterior left upper lobe pulmonary nodule measures 3 mm on image 29/301.  Bibasilar scarring with bronchiolectasis. No pleural effusion. No pneumothorax.  Musculoskeletal: No aggressive lytic or blastic lesion of bone. Multilevel degenerative changes spine.  CT ABDOMEN PELVIS FINDINGS  Hepatobiliary: Stable bilobar hepatic cysts. No new suspicious hepatic lesion. Gallbladder is unremarkable. No biliary ductal dilation.  Pancreas: No pancreatic ductal dilation or evidence of acute inflammation.  Spleen: No splenomegaly.  Adrenals/Urinary Tract: Bilateral adrenal glands appear normal. No hydronephrosis. Bilateral renal sinus cysts are considered benign and requiring no independent imaging follow-up. Kidneys demonstrate symmetric enhancement and excretion of contrast material. Fluid density cortical cysts in the right kidney measuring 12 mm on image 63/3 is considered benign and requires no independent imaging follow-up. Urinary bladder is unremarkable for degree of distension.  Stomach/Bowel: Stomach is distended with ingested material without focal wall thickening. No pathologic dilation of small or large bowel. Left-sided colonic diverticulosis without findings of acute diverticulitis.  Vascular/Lymphatic: Aortic atherosclerosis without aneurysmal dilation of the aorta. Smooth IVC contours. No pathologically enlarged abdominal or pelvic lymph  nodes.  Reproductive: Uterus and bilateral adnexa are unremarkable.  Other: Fluid attenuation lesion in the left groin measures 9 x 7 mm on image 107/3 previously 10 x 10 mm. Small bilateral fat containing inguinal hernias. No significant abdominopelvic free fluid.  Musculoskeletal: No aggressive lytic or blastic lesion of bone. Stable sclerotic foci in the bilateral femoral heads present dating back to at least April 07, 2014 and favored to reflect benign bone islands. Multilevel degenerative change of the spine. Degenerative change of the bilateral SI joints with partial bony ankylosis of the right SI joint. Degenerative spurring of the bilateral hips.  IMPRESSION: 1. Stable examination without new or progressive findings in the chest, abdomen or pelvis. 2. Stable tiny bilateral pulmonary nodules, favored sequela of prior infection or inflammation. Suggest continued attention on follow-up imaging. 3. Fluid attenuation lesion in the left groin is slightly decreased in size and favored to reflect a small seroma or lymphocele. 4. Moderate size hiatal hernia. 5. Left-sided colonic diverticulosis without findings of acute diverticulitis. 6. Aortic Atherosclerosis (ICD10-I70.0).  LABS:  Latest Reference Range & Units 01/17/23 00:00  Sodium 137 - 147  141 (E)  Potassium 3.5 - 5.1 mEq/L 4.0 (E)  Chloride 99 - 108  111 ! (E)  CO2 13 - 22  22 (E)  Glucose  71 (E)  BUN 4 - 21  14 (E)  Creatinine 0.5 - 1.1  0.5 (E)  Calcium 8.7 - 10.7  9.2 (E)  Alkaline Phosphatase 25 - 125  90 (E)  Albumin 3.5 - 5.0  3.7 (E)  AST 13 - 35  44 ! (E)  ALT 7 - 35 U/L 28 (E)  Bilirubin, Total  0.5 (E)  WBC  2.8 (E)  RBC 3.87 - 5.11  3.57 ! (E)  Hemoglobin 12.0 - 16.0  11.1 ! (E)  HCT 36 - 46  34 ! (E)  Platelets 150 - 400 K/uL 147 ! (E)  NEUT#  1.57 (E)  !: Data is abnormal (E): External lab result  ASSESSMENT & PLAN:  Assessment/Plan:  A 72 y.o. female with metastatic HER2 Neu receptor positive  breast cancer, status post a suboccipital brain metastatecomy in April 2022.  In clinic today, I went over all of her CT scan images with her, for which she could see that her disease remains under ideal control.  Based upon this, she will proceed with her 31st cycle of Enhertu next week.  Clinically, she continues to do very well.  I will see her back in 3 weeks before she heads into her 32nd cycle of Enhertu therapy.  The patient understands all the plans discussed today and is in agreement with them.    Darianna Amy Kirby Funk, MD

## 2023-01-18 ENCOUNTER — Inpatient Hospital Stay (HOSPITAL_BASED_OUTPATIENT_CLINIC_OR_DEPARTMENT_OTHER): Payer: Self-pay | Admitting: Oncology

## 2023-01-18 DIAGNOSIS — C50412 Malignant neoplasm of upper-outer quadrant of left female breast: Secondary | ICD-10-CM

## 2023-01-18 DIAGNOSIS — Z171 Estrogen receptor negative status [ER-]: Secondary | ICD-10-CM

## 2023-01-21 ENCOUNTER — Other Ambulatory Visit: Payer: Self-pay | Admitting: Oncology

## 2023-01-21 DIAGNOSIS — Z171 Estrogen receptor negative status [ER-]: Secondary | ICD-10-CM

## 2023-01-21 MED FILL — Dexamethasone Sodium Phosphate Inj 100 MG/10ML: INTRAMUSCULAR | Qty: 1 | Status: AC

## 2023-01-22 ENCOUNTER — Inpatient Hospital Stay: Payer: Self-pay

## 2023-01-22 VITALS — BP 112/79 | HR 77 | Temp 97.8°F | Resp 16

## 2023-01-22 DIAGNOSIS — Z171 Estrogen receptor negative status [ER-]: Secondary | ICD-10-CM

## 2023-01-22 MED ORDER — SODIUM CHLORIDE 0.9% FLUSH
10.0000 mL | INTRAVENOUS | Status: DC | PRN
  Administered 2023-01-22: 10 mL

## 2023-01-22 MED ORDER — FAM-TRASTUZUMAB DERUXTECAN-NXKI CHEMO 100 MG IV SOLR
3.6300 mg/kg | Freq: Once | INTRAVENOUS | Status: AC
  Administered 2023-01-22: 240 mg via INTRAVENOUS
  Filled 2023-01-22: qty 12

## 2023-01-22 MED ORDER — DEXTROSE 5 % IV SOLN: Freq: Once | INTRAVENOUS | Status: AC

## 2023-01-22 MED ORDER — PALONOSETRON HCL INJECTION 0.25 MG/5ML
0.2500 mg | Freq: Once | INTRAVENOUS | Status: AC
  Administered 2023-01-22: 0.25 mg via INTRAVENOUS
  Filled 2023-01-22: qty 5

## 2023-01-22 MED ORDER — ACETAMINOPHEN 325 MG PO TABS
650.0000 mg | ORAL_TABLET | Freq: Once | ORAL | Status: AC
  Administered 2023-01-22: 650 mg via ORAL
  Filled 2023-01-22: qty 2

## 2023-01-22 MED ORDER — HEPARIN SOD (PORK) LOCK FLUSH 100 UNIT/ML IV SOLN
500.0000 [IU] | Freq: Once | INTRAVENOUS | Status: AC | PRN
  Administered 2023-01-22: 500 [IU]

## 2023-01-22 MED ORDER — DIPHENHYDRAMINE HCL 25 MG PO CAPS
50.0000 mg | ORAL_CAPSULE | Freq: Once | ORAL | Status: AC
  Administered 2023-01-22: 50 mg via ORAL
  Filled 2023-01-22: qty 2

## 2023-01-22 MED ORDER — SODIUM CHLORIDE 0.9 % IV SOLN
10.0000 mg | Freq: Once | INTRAVENOUS | Status: AC
  Administered 2023-01-22: 10 mg via INTRAVENOUS
  Filled 2023-01-22: qty 10

## 2023-01-22 NOTE — Patient Instructions (Signed)
Instrucciones al darle de alta: Discharge Instructions Gracias por elegir al [facility] de [place] para brindarle atencin mdica de oncologa y Music therapist.   Si usted tiene una cita de laboratorio con [place], por favor vaya directamente [place] y regstrese en el rea de Control and instrumentation engineer.   Use ropa cmoda y [place] para tener fcil acceso a las vas del Portacath (acceso venoso de Engineer, site duracin) o la lnea PICC (catter central colocado por va perifrica).   Nos esforzamos por ofrecerle tiempo de calidad con su proveedor. Es posible que tenga que volver a programar su cita si llega tarde (15 minutos o ms).  El llegar tarde le afecta a usted y a otros pacientes cuyas citas son posteriores a Merchandiser, retail.  Adems, si usted falta a tres o ms citas sin avisar a la oficina, puede ser retirado(a) de la clnica a discrecin del proveedor.      Para las solicitudes de renovacin de recetas, pida a su farmacia que se ponga en contacto con nuestra oficina y deje que transcurran 32 horas para que se complete el proceso de las renovaciones.    Hoy usted recibi los siguientes agentes de quimioterapia e/o inmunoterapia Enhertu      Para ayudar a prevenir las nuseas y los vmitos despus de su tratamiento, le recomendamos que tome su medicamento para las nuseas segn las indicaciones.  LOS SNTOMAS QUE DEBEN COMUNICARSE INMEDIATAMENTE SE INDICAN A CONTINUACIN: *FIEBRE SUPERIOR A 100.4 F (38 C) O MS *ESCALOFROS O SUDORACIN *NUSEAS Y VMITOS QUE NO SE CONTROLAN CON EL MEDICAMENTO PARA LAS NUSEAS *DIFICULTAD INUSUAL PARA RESPIRAR  *MORETONES O HEMORRAGIAS NO HABITUALES *PROBLEMAS URINARIOS (dolor o ardor al Garment/textile technologist o frecuencia para Garment/textile technologist) *PROBLEMAS INTESTINALES (diarrea inusual, estreimiento, dolor cerca del ano) SENSIBILIDAD EN LA BOCA Y EN LA GARGANTA CON O SIN LA PRESENCIA DE LCERAS (dolor de garganta, llagas en la boca o dolor de muelas/dientes) ERUPCIN,  HINCHAZN O DOLORES INUSUALES FLUJO VAGINAL INUSUAL O PICAZN/RASQUIA    Los puntos marcados con un asterisco ( *) indican una posible emergencia y debe hacer un seguimiento tan pronto como le sea posible o vaya al Departamento de Emergencias si se le presenta algn problema.  Por favor, muestre la [place] DE ADVERTENCIA DE Windy Canny DE ADVERTENCIA DE Benay Spice al registrarse en [address] de Emergencias y a la enfermera de triaje.  Si tiene preguntas despus de su visita o necesita cancelar o volver a programar su cita, por favor pngase en contacto con [place]  Dept: [phone]  y [place] instrucciones. Las horas de oficina son de 8:00 a.m. a 4:30 p.m. de lunes a viernes. Por favor, tenga en cuenta que los mensajes de voz que se dejan despus de las 4:00 p.m. posiblemente no se devolvern hasta el siguiente da de [place].  Cerramos los fines de semana y [employer]. En todo momento tiene acceso a una enfermera para preguntas urgentes. Por favor, llame al nmero principal de la clnica Dept: [phone] y [place] instrucciones.   Para cualquier pregunta que no sea de carcter urgente, tambin puede ponerse en contacto con su proveedor [employer]. Ahora ofrecemos visitas electrnicas para cualquier persona mayor de 18 aos que solicite atencin mdica en lnea para los sntomas que no sean urgentes. Para ms detalles vaya a [email or web address].   Tambin puede bajar la aplicacin de MyChart! Vaya a la tienda de aplicaciones, busque "MyChart", abra la aplicacin,  seleccione Lenora, e ingrese con su nombre de usuario y la contrasea de MyChart.   

## 2023-01-25 ENCOUNTER — Encounter: Payer: Self-pay | Admitting: Oncology

## 2023-01-29 ENCOUNTER — Other Ambulatory Visit: Payer: Self-pay

## 2023-02-04 NOTE — Progress Notes (Signed)
Sent in request for DOS 02/08/2023 and 02/12/2023 to [employer].

## 2023-02-07 ENCOUNTER — Encounter: Payer: Self-pay | Admitting: Oncology

## 2023-02-07 NOTE — Progress Notes (Signed)
[facility]  [address] [place],  [place]  [address] [phone]  Clinic Day:  02/08/2023  HISTORY OF PRESENT ILLNESS:  The patient is a 72 y.o. 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  She also had a subcarinal lymph node for which scans have consistently shown a positive response to therapy. She comes in today to be evaluated before heading into her 32nd cycle of Enhertu. The patient claims that she tolerated her 31st cycle of treatment fairly well.  She has chronic, intermittent joint pains likely related to underlying arthritis.  As it pertains to her metastatic breast cancer, she denies having any new symptoms or findings which concern her for overt signs of disease progression.    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  The patient received 6 cycles of Taxotere/Herceptin/Perjeta before being switched to maintenance Perjeta/Herceptin, for which she took 73 cycles of treatment.  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  This all happened in the setting of a prolonged treatment break from TDM-1.  Since recurrence was found, the patient has been on Enhertu since May 2022.  VITALS:  Blood pressure 121/67, pulse 70, temperature 99.1 F (37.3 C), resp. rate 14, height 5\' 1"  (1.549 m), weight 152 lb 14.4 oz (69.4 kg), SpO2 96 %.  Wt Readings from Last 3 Encounters:  02/12/23 152 lb 1.9 oz (69 kg)  02/08/23 152 lb 14.4 oz (69.4 kg)  01/18/23 152 lb 12.8 oz (69.3 kg)    Body mass index is 28.89 kg/m.  Performance  status (ECOG): 1 - Symptomatic but completely ambulatory  PHYSICAL EXAM:  Physical Exam Constitutional:      General: She is not in acute distress.    Appearance: Normal appearance. She is normal weight.  HENT:     Head: Normocephalic and atraumatic.  Eyes:     General: No scleral icterus.    Extraocular Movements: Extraocular movements intact.     Conjunctiva/sclera: Conjunctivae normal.     Pupils: Pupils are equal, round, and reactive to light.  Cardiovascular:     Rate and Rhythm: Normal rate and regular rhythm.     Pulses: Normal pulses.     Heart sounds: Normal heart sounds. No murmur heard.    No friction rub. No gallop.  Pulmonary:     Effort: Pulmonary effort is normal. No respiratory distress.     Breath sounds: Normal breath sounds.  Abdominal:     General: Bowel sounds are normal. There is no distension.     Palpations: Abdomen is soft. There is no hepatomegaly, splenomegaly or mass.     Tenderness: There is no abdominal tenderness.  Musculoskeletal:        General: Normal range of motion.     Cervical back: Normal range of motion and neck supple.     Right lower leg: No edema.     Left lower leg: No edema.  Lymphadenopathy:     Cervical: No cervical adenopathy.  Skin:    General: Skin is warm and dry.  Neurological:     General: No  focal deficit present.     Mental Status: She is alert and oriented to person, place, and time. Mental status is at baseline.  Psychiatric:        Mood and Affect: Mood normal.        Behavior: Behavior normal.        Thought Content: Thought content normal.        Judgment: Judgment normal.    LABS:  Latest Reference Range & Units 02/08/23 00:00  WBC  2.5 (E)  RBC 3.87 - 5.11  3.30 ! (E)  Hemoglobin 12.0 - 16.0  10.5 ! (E)  HCT 36 - 46  31 ! (E)  Platelets 150 - 400 K/uL 173 (E)  NEUT#  1.40 (E)  !: Data is abnormal (E): External lab result  Latest Reference Range & Units 02/08/23 14:00  Sodium 135 - 145 mmol/L 140   Potassium 3.5 - 5.1 mmol/L 3.7  Chloride 98 - 111 mmol/L 111  CO2 22 - 32 mmol/L 23  Glucose 70 - 99 mg/dL 161 (H)  BUN 8 - 23 mg/dL 13  Creatinine 0.96 - 0.45 mg/dL 4.09  Calcium 8.9 - 81.1 mg/dL 8.9  Anion gap 5 - 15  6  Alkaline Phosphatase 38 - 126 U/L 90  Albumin 3.5 - 5.0 g/dL 3.2 (L)  AST 15 - 41 U/L 39  ALT 0 - 44 U/L 25  Total Protein 6.5 - 8.1 g/dL 7.1  Total Bilirubin 0.3 - 1.2 mg/dL 0.4  GFR, Est Non African American >60 mL/min >60  (H): Data is abnormally high (L): Data is abnormally low  ASSESSMENT & PLAN:  Assessment/Plan:  A 72 y.o. 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  She will proceed with her 32nd cycle of Enhertu next week.  Clinically, she continues to do very well.  I will see her back in 3 weeks before she heads into her 33rd cycle of Enhertu therapy.  The patient understands all the plans discussed today and is in agreement with them.    Ivin Rosenbloom Kirby Funk, MD

## 2023-02-07 NOTE — Progress Notes (Deleted)
[facility]  [address] [place],  [place]  [id number] [phone]  Clinic Day: 01/18/2023  HISTORY OF PRESENT ILLNESS:  The patient is a 72 y.o. 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  She also had a subcarinal lymph node for which scans have shown a positive response to therapy. She comes in today to go over her CT scans to ascertain her new disease baseline after receiving 30 cycles of Enhertu. The patient claims that she tolerated her 30th cycle of treatment fairly well.  She has chronic, intermittent joint pains likely related to underlying arthritis.  As it pertains to her metastatic breast cancer, she denies having any new symptoms or findings which concern her for overt signs of disease progression.    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  The patient received 6 cycles of Taxotere/Herceptin/Perjeta before being switched to maintenance Perjeta/Herceptin, for which she took 73 cycles of treatment.  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  This all happened in the setting of a prolonged treatment break from TDM-1.  Since recurrence was found, the patient has been on Enhertu since May 2022.  VITALS:  There were no vitals taken for this visit.  Wt Readings from Last 3 Encounters:  01/18/23 152 lb 12.8 oz (69.3 kg)  01/01/23 154 lb (69.9 kg)  12/28/22 150 lb 11.2 oz (68.4 kg)    There is no height or weight on file to calculate BMI.  Performance status (ECOG): 1 - Symptomatic but completely  ambulatory  PHYSICAL EXAM:  Physical Exam Constitutional:      General: She is not in acute distress.    Appearance: Normal appearance. She is normal weight.  HENT:     Head: Normocephalic and atraumatic.  Eyes:     General: No scleral icterus.    Extraocular Movements: Extraocular movements intact.     Conjunctiva/sclera: Conjunctivae normal.     Pupils: Pupils are equal, round, and reactive to light.  Cardiovascular:     Rate and Rhythm: Normal rate and regular rhythm.     Pulses: Normal pulses.     Heart sounds: Normal heart sounds. No murmur heard.    No friction rub. No gallop.  Pulmonary:     Effort: Pulmonary effort is normal. No respiratory distress.     Breath sounds: Normal breath sounds.  Abdominal:     General: Bowel sounds are normal. There is no distension.     Palpations: Abdomen is soft. There is no hepatomegaly, splenomegaly or mass.     Tenderness: There is no abdominal tenderness.  Musculoskeletal:        General: Normal range of motion.     Cervical back: Normal range of motion and neck supple.     Right lower leg: No edema.     Left lower leg: No edema.  Lymphadenopathy:     Cervical: No cervical adenopathy.  Skin:    General: Skin is warm and dry.  Neurological:     General: No focal deficit present.     Mental Status: She is alert  and oriented to person, place, and time. Mental status is at baseline.  Psychiatric:        Mood and Affect: Mood normal.        Behavior: Behavior normal.        Thought Content: Thought content normal.        Judgment: Judgment normal.   SCANS:  CT scans of her chest/abdomen/pelvis revealed the following: FINDINGS: CT CHEST FINDINGS  Cardiovascular: Accessed right chest Port-A-Cath with tip at the superior cavoatrial junction. Aortic atherosclerosis. Normal size heart. No significant pericardial effusion/thickening..  Mediastinum/Nodes: No suspicious thyroid nodule.  No pathologically enlarged mediastinal, hilar  or axillary lymph nodes. Prior left axillary lymph node dissection. Moderate size hiatal hernia.  Lungs/Pleura: No significant interval change in the scattered tiny bilateral pulmonary nodules. No new suspicious pulmonary nodules or masses. Previously indexed nodules are as follows:  -nodule in the right lung apex measures 3 mm on image 19/301, unchanged.  -Inferior right upper lobe pulmonary nodule measures 4 mm on image 51/301.  -posterior left upper lobe pulmonary nodule measures 3 mm on image 29/301.  Bibasilar scarring with bronchiolectasis. No pleural effusion. No pneumothorax.  Musculoskeletal: No aggressive lytic or blastic lesion of bone. Multilevel degenerative changes spine.  CT ABDOMEN PELVIS FINDINGS  Hepatobiliary: Stable bilobar hepatic cysts. No new suspicious hepatic lesion. Gallbladder is unremarkable. No biliary ductal dilation.  Pancreas: No pancreatic ductal dilation or evidence of acute inflammation.  Spleen: No splenomegaly.  Adrenals/Urinary Tract: Bilateral adrenal glands appear normal. No hydronephrosis. Bilateral renal sinus cysts are considered benign and requiring no independent imaging follow-up. Kidneys demonstrate symmetric enhancement and excretion of contrast material. Fluid density cortical cysts in the right kidney measuring 12 mm on image 63/3 is considered benign and requires no independent imaging follow-up. Urinary bladder is unremarkable for degree of distension.  Stomach/Bowel: Stomach is distended with ingested material without focal wall thickening. No pathologic dilation of small or large bowel. Left-sided colonic diverticulosis without findings of acute diverticulitis.  Vascular/Lymphatic: Aortic atherosclerosis without aneurysmal dilation of the aorta. Smooth IVC contours. No pathologically enlarged abdominal or pelvic lymph nodes.  Reproductive: Uterus and bilateral adnexa are unremarkable.  Other: Fluid attenuation  lesion in the left groin measures 9 x 7 mm on image 107/3 previously 10 x 10 mm. Small bilateral fat containing inguinal hernias. No significant abdominopelvic free fluid.  Musculoskeletal: No aggressive lytic or blastic lesion of bone. Stable sclerotic foci in the bilateral femoral heads present dating back to at least April 07, 2014 and favored to reflect benign bone islands. Multilevel degenerative change of the spine. Degenerative change of the bilateral SI joints with partial bony ankylosis of the right SI joint. Degenerative spurring of the bilateral hips.  IMPRESSION: 1. Stable examination without new or progressive findings in the chest, abdomen or pelvis. 2. Stable tiny bilateral pulmonary nodules, favored sequela of prior infection or inflammation. Suggest continued attention on follow-up imaging. 3. Fluid attenuation lesion in the left groin is slightly decreased in size and favored to reflect a small seroma or lymphocele. 4. Moderate size hiatal hernia. 5. Left-sided colonic diverticulosis without findings of acute diverticulitis. 6. Aortic Atherosclerosis (ICD10-I70.0). LABS:  Latest Reference Range & Units 12/28/22 00:00  WBC  3.8 (E)  RBC 3.87 - 5.11  4.04 (E)  Hemoglobin 12.0 - 16.0  12.7 (E)  HCT 36 - 46  38 (E)  Platelets 150 - 400 K/uL 141 ! (E)  NEUT#  2.39 (E)  !: Data is abnormal (E):  External lab result  ASSESSMENT & PLAN:  Assessment/Plan:  A 72 y.o. 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  She will proceed with her 30th cycle of Enhertu next week.  Clinically, she continues to do very well.  I will see her back in 3 weeks before she heads into her 31st cycle of Enhertu therapy.  CT scans will be done a day before her next visit to ascertain her new disease baseline after 30 cycles of Enhertu.  The patient understands all the plans discussed today and is in agreement with  them.    Adan Baehr Kirby Funk, MD

## 2023-02-08 ENCOUNTER — Inpatient Hospital Stay: Payer: Self-pay

## 2023-02-08 ENCOUNTER — Inpatient Hospital Stay: Payer: Self-pay | Attending: Oncology | Admitting: Oncology

## 2023-02-08 DIAGNOSIS — Z171 Estrogen receptor negative status [ER-]: Secondary | ICD-10-CM

## 2023-02-08 DIAGNOSIS — C7931 Secondary malignant neoplasm of brain: Secondary | ICD-10-CM | POA: Insufficient documentation

## 2023-02-08 DIAGNOSIS — Z170 Estrogen receptor positive status [ER+]: Secondary | ICD-10-CM | POA: Insufficient documentation

## 2023-02-08 DIAGNOSIS — C50919 Malignant neoplasm of unspecified site of unspecified female breast: Secondary | ICD-10-CM | POA: Insufficient documentation

## 2023-02-08 DIAGNOSIS — Z5112 Encounter for antineoplastic immunotherapy: Secondary | ICD-10-CM | POA: Insufficient documentation

## 2023-02-08 DIAGNOSIS — C50412 Malignant neoplasm of upper-outer quadrant of left female breast: Secondary | ICD-10-CM

## 2023-02-08 DIAGNOSIS — C770 Secondary and unspecified malignant neoplasm of lymph nodes of head, face and neck: Secondary | ICD-10-CM | POA: Insufficient documentation

## 2023-02-08 LAB — CMP (CANCER CENTER ONLY)
ALT: 25 U/L (ref 0–44)
AST: 39 U/L (ref 15–41)
Albumin: 3.2 g/dL — ABNORMAL LOW (ref 3.5–5.0)
Alkaline Phosphatase: 90 U/L (ref 38–126)
Anion gap: 6 (ref 5–15)
BUN: 13 mg/dL (ref 8–23)
CO2: 23 mmol/L (ref 22–32)
Calcium: 8.9 mg/dL (ref 8.9–10.3)
Chloride: 111 mmol/L (ref 98–111)
Creatinine: 0.8 mg/dL (ref 0.44–1.00)
GFR, Estimated: >60 mL/min (ref >60)
Glucose, Bld: 100 mg/dL — ABNORMAL HIGH (ref 70–99)
Potassium: 3.7 mmol/L (ref 3.5–5.1)
Sodium: 140 mmol/L (ref 135–145)
Total Bilirubin: 0.4 mg/dL (ref 0.3–1.2)
Total Protein: 7.1 g/dL (ref 6.5–8.1)

## 2023-02-08 LAB — CBC AND DIFFERENTIAL
HCT: 31 — AB (ref 36–46)
Hemoglobin: 10.5 — AB (ref 12.0–16.0)
Neutrophils Absolute: 1.4
Platelets: 173 10*3/uL (ref 150–400)
WBC: 2.5

## 2023-02-08 LAB — CBC W DIFFERENTIAL (~~LOC~~ CC SCANNED REPORT)

## 2023-02-08 LAB — CBC: RBC: 3.3 — AB (ref 3.87–5.11)

## 2023-02-10 ENCOUNTER — Other Ambulatory Visit: Payer: Self-pay

## 2023-02-11 MED FILL — Dexamethasone Sodium Phosphate Inj 100 MG/10ML: INTRAMUSCULAR | Qty: 1 | Status: AC

## 2023-02-11 NOTE — Progress Notes (Signed)
OK to proceed with fam-trastuzumab deruxtecan despite ANC=1400 per Dr. Melvyn Neth.

## 2023-02-12 ENCOUNTER — Inpatient Hospital Stay: Payer: Self-pay

## 2023-02-12 VITALS — BP 123/66 | HR 87 | Temp 97.7°F | Resp 18 | Ht 61.0 in | Wt 152.1 lb

## 2023-02-12 DIAGNOSIS — Z171 Estrogen receptor negative status [ER-]: Secondary | ICD-10-CM

## 2023-02-12 MED ORDER — SODIUM CHLORIDE 0.9% FLUSH
10.0000 mL | INTRAVENOUS | Status: DC | PRN
  Administered 2023-02-12: 10 mL

## 2023-02-12 MED ORDER — DIPHENHYDRAMINE HCL 25 MG PO CAPS
50.0000 mg | ORAL_CAPSULE | Freq: Once | ORAL | Status: AC
  Administered 2023-02-12: 50 mg via ORAL
  Filled 2023-02-12: qty 2

## 2023-02-12 MED ORDER — FAM-TRASTUZUMAB DERUXTECAN-NXKI CHEMO 100 MG IV SOLR
3.6300 mg/kg | Freq: Once | INTRAVENOUS | Status: AC
  Administered 2023-02-12: 240 mg via INTRAVENOUS
  Filled 2023-02-12: qty 12

## 2023-02-12 MED ORDER — PALONOSETRON HCL INJECTION 0.25 MG/5ML
0.2500 mg | Freq: Once | INTRAVENOUS | Status: AC
  Administered 2023-02-12: 0.25 mg via INTRAVENOUS
  Filled 2023-02-12: qty 5

## 2023-02-12 MED ORDER — DEXTROSE 5 % IV SOLN: Freq: Once | INTRAVENOUS | Status: AC

## 2023-02-12 MED ORDER — SODIUM CHLORIDE 0.9 % IV SOLN
10.0000 mg | Freq: Once | INTRAVENOUS | Status: AC
  Administered 2023-02-12: 10 mg via INTRAVENOUS
  Filled 2023-02-12: qty 10

## 2023-02-12 MED ORDER — HEPARIN SOD (PORK) LOCK FLUSH 100 UNIT/ML IV SOLN
500.0000 [IU] | Freq: Once | INTRAVENOUS | Status: AC | PRN
  Administered 2023-02-12: 500 [IU]

## 2023-02-12 MED ORDER — ACETAMINOPHEN 325 MG PO TABS
650.0000 mg | ORAL_TABLET | Freq: Once | ORAL | Status: AC
  Administered 2023-02-12: 650 mg via ORAL
  Filled 2023-02-12: qty 2

## 2023-02-12 NOTE — Patient Instructions (Signed)
Fam-Trastuzumab Deruxtecan Injection Qu es este medicamento? El FAM-TRASTUZUMAB DERUXTECAN trata algunos tipos de cncer. Acta bloqueando una protena que hace que las clulas cancerosas crezcan y se multipliquen. Esto ayuda a desacelerar o detener la propagacin de las clulas cancerosas. Este medicamento puede ser utilizado para otros usos; si tiene alguna pregunta consulte con su proveedor de atencin mdica o con su farmacutico. MARCAS COMUNES: ENHERTU Qu le debo informar a mi profesional de la salud antes de tomar este medicamento? Necesitan saber si usted presenta alguno de los siguientes problemas o situaciones: Enfermedad cardiaca Insuficiencia cardiaca Infeccin, especialmente infecciones virales, tales como varicela, fuegos labiales o herpes Enfermedad heptica Enfermedad pulmonar o respiratoria, tales como asma o EPOC Una reaccin alrgica o inusual al fam-trastuzumab deruxtecan, a otros medicamentos, alimentos, colorantes o conservantes Si est embarazada o buscando quedar embarazada Si est amamantando a un beb Cmo debo utilizar este medicamento? Este medicamento se inyecta en una vena. Su equipo de atencin lo administra en un hospital o en un entorno clnico. Se le entregar una Gua del medicamento (MedGuide, su nombre en ingls) especial antes de cada tratamiento. Asegrese de leer esta informacin cada vez cuidadosamente. Hable con su equipo de atencin sobre el uso de este medicamento en nios. Puede requerir atencin especial. Sobredosis: Pngase en contacto inmediatamente con un centro toxicolgico o una sala de urgencia si usted cree que haya tomado demasiado medicamento. ATENCIN: Este medicamento es solo para usted. No comparta este medicamento con nadie. Qu sucede si me olvido de una dosis? Es importante no olvidar ninguna dosis. Llame a su equipo de atencin si no puede asistir a una cita. Qu puede interactuar con este medicamento? No se anticipan  interacciones. Puede ser que esta lista no menciona todas las posibles interacciones. Informe a su profesional de la salud de todos los productos a base de hierbas, medicamentos de venta libre o suplementos nutritivos que est tomando. Si usted fuma, consume bebidas alcohlicas o si utiliza drogas ilegales, indqueselo tambin a su profesional de la salud. Algunas sustancias pueden interactuar con su medicamento. A qu debo estar atento al usar este medicamento? Visite a su equipo de atencin para que revise su evolucin peridicamente. Si los sntomas no comienzan a mejorar o si empeoran, consulte con su equipo de atencin. Se supervisar su estado de salud atentamente mientras reciba este medicamento. No debe quedar embarazada mientras est usando este medicamento o por 7 meses despus de dejar de usarlo. Las mujeres deben informar a su equipo de atencin si estn buscando quedar embarazadas o si creen que podran estar embarazadas. Los hombres no deben embarazar a una mujer mientras estn recibiendo este medicamento y durante 4 meses despus de dejar de usarlo. Existe la posibilidad de que ocurran efectos secundarios graves en un beb sin nacer. Para obtener ms informacin, hable con su equipo de atencin. No debe amamantar a un beb mientras est usando este medicamento o durante 7 meses despus de la ltima dosis. Este medicamento ha causado recuentos de esperma reducidos en algunos hombres. Esto puede hacer ms difcil que un hombre embarace a una mujer. Hable con su equipo de atencin si le preocupa su fertilidad. Este medicamento podra aumentar el riesgo de moretones o sangrado. Llame a su equipo de atencin si observa sangrados inusuales. Proceda con cuidado al cepillar sus dientes, usar hilo dental o utilizar palillos para los dientes, ya que podra contraer una infeccin o sangrar con mayor facilidad. Si recibe algn tratamiento dental, informe a su dentista que est usando este    medicamento. Este medicamento puede resecarle los ojos y provocar visin borrosa. Si usa lentes de contacto, puede sentir ciertas molestias. Las gotas lubricantes para los ojos pueden ser tiles. Si el problema no desaparece o es grave, consulte a su equipo de atencin. Este medicamento puede aumentar su riesgo de contraer una infeccin. Llame a su equipo de atencin si tiene fiebre, escalofros, dolor de garganta o cualquier otro sntoma de resfriado o gripe. No se trate usted mismo. Trate de no acercarse a personas que estn enfermas. Evite usar medicamentos que contienen aspirina, acetaminofeno, ibuprofeno, naproxeno o ketoprofeno, a menos que as lo indique su equipo de atencin. Estos medicamentos pueden ocultar la fiebre. Qu efectos secundarios puedo tener al utilizar este medicamento? Efectos secundarios que debe informar a su equipo de atencin tan pronto como sea posible: Reacciones alrgicas: erupcin cutnea, comezn/picazn, urticaria, hinchazn de la cara, los labios, la lengua o la garganta Tos seca, falta de aire o problemas para respirar Infeccin: fiebre, escalofros, tos, dolor de garganta, heridas que no sanan, dolor o problemas para orinar, sensacin general de molestia o malestar Insuficiencia cardiaca: falta de aire, hinchazn de los tobillos, los pies o las manos, aumento de peso repentino, debilidad o fatiga inusuales Sangrado o moretones inusuales Efectos secundarios que generalmente no requieren atencin mdica (debe informarlos a su equipo de atencin si persisten o si son molestos): Estreimiento Diarrea Cada del cabello Dolor muscular Nuseas Vmito Puede ser que esta lista no menciona todos los posibles efectos secundarios. Comunquese a su mdico por asesoramiento mdico sobre los efectos secundarios. Usted puede informar los efectos secundarios a la FDA por telfono al 1-800-FDA-1088. Dnde debo guardar mi medicina? Este medicamento se administra en hospitales  o clnicas. No se guarda en su casa. ATENCIN: Este folleto es un resumen. Puede ser que no cubra toda la posible informacin. Si usted tiene preguntas acerca de esta medicina, consulte con su mdico, su farmacutico o su profesional de la salud.  2023 Elsevier/Gold Standard (2021-09-06 00:00:00)  

## 2023-02-15 ENCOUNTER — Encounter: Payer: Self-pay | Admitting: Oncology

## 2023-02-15 NOTE — Telephone Encounter (Signed)
Done

## 2023-03-04 NOTE — Progress Notes (Signed)
Sent in request for DOS 03/08/2023 and 03/12/2023 to [employer]

## 2023-03-08 ENCOUNTER — Inpatient Hospital Stay: Payer: Self-pay | Attending: Oncology

## 2023-03-08 ENCOUNTER — Encounter: Payer: Self-pay | Admitting: Oncology

## 2023-03-08 ENCOUNTER — Other Ambulatory Visit: Payer: Self-pay | Admitting: Oncology

## 2023-03-08 ENCOUNTER — Inpatient Hospital Stay (HOSPITAL_BASED_OUTPATIENT_CLINIC_OR_DEPARTMENT_OTHER): Payer: Self-pay | Admitting: Oncology

## 2023-03-08 DIAGNOSIS — Z170 Estrogen receptor positive status [ER+]: Secondary | ICD-10-CM | POA: Insufficient documentation

## 2023-03-08 DIAGNOSIS — D509 Iron deficiency anemia, unspecified: Secondary | ICD-10-CM | POA: Insufficient documentation

## 2023-03-08 DIAGNOSIS — C7931 Secondary malignant neoplasm of brain: Secondary | ICD-10-CM | POA: Insufficient documentation

## 2023-03-08 DIAGNOSIS — C50412 Malignant neoplasm of upper-outer quadrant of left female breast: Secondary | ICD-10-CM

## 2023-03-08 DIAGNOSIS — Z171 Estrogen receptor negative status [ER-]: Secondary | ICD-10-CM

## 2023-03-08 DIAGNOSIS — Z5112 Encounter for antineoplastic immunotherapy: Secondary | ICD-10-CM | POA: Insufficient documentation

## 2023-03-08 DIAGNOSIS — C50919 Malignant neoplasm of unspecified site of unspecified female breast: Secondary | ICD-10-CM | POA: Insufficient documentation

## 2023-03-08 LAB — CMP (CANCER CENTER ONLY)
ALT: 20 U/L (ref 0–44)
AST: 30 U/L (ref 15–41)
Albumin: 2.9 g/dL — ABNORMAL LOW (ref 3.5–5.0)
Alkaline Phosphatase: 86 U/L (ref 38–126)
Anion gap: 9 (ref 5–15)
BUN: 12 mg/dL (ref 8–23)
CO2: 21 mmol/L — ABNORMAL LOW (ref 22–32)
Calcium: 9 mg/dL (ref 8.9–10.3)
Chloride: 107 mmol/L (ref 98–111)
Creatinine: 0.78 mg/dL (ref 0.44–1.00)
GFR, Estimated: >60 mL/min (ref >60)
Glucose, Bld: 94 mg/dL (ref 70–99)
Potassium: 3.8 mmol/L (ref 3.5–5.1)
Sodium: 137 mmol/L (ref 135–145)
Total Bilirubin: 0.5 mg/dL (ref 0.3–1.2)
Total Protein: 7.8 g/dL (ref 6.5–8.1)

## 2023-03-08 LAB — IRON AND TIBC
Iron: 21 ug/dL — ABNORMAL LOW (ref 28–170)
Saturation Ratios: 6 % — ABNORMAL LOW (ref 10.4–31.8)
TIBC: 328 ug/dL (ref 250–450)
UIBC: 307 ug/dL

## 2023-03-08 LAB — CBC AND DIFFERENTIAL
HCT: 26 — AB (ref 36–46)
Hemoglobin: 8.5 — AB (ref 12.0–16.0)
Neutrophils Absolute: 2.81
Platelets: 507 10*3/uL — AB (ref 150–400)
WBC: 4.2

## 2023-03-08 LAB — FERRITIN: Ferritin: 22 ng/mL (ref 11–307)

## 2023-03-08 LAB — CBC: RBC: 2.89 — AB (ref 3.87–5.11)

## 2023-03-08 NOTE — Progress Notes (Signed)
[facility]  [address] [place],  [place]  [address] [phone]  Clinic Day:  03/08/2023  HISTORY OF PRESENT ILLNESS:  The patient is a 72 y.o. 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  She also had a subcarinal lymph node for which scans have consistently shown a positive response to therapy. She comes in today to be evaluated before heading into her 33rd cycle of Enhertu. The patient claims that she tolerated her 32nd cycle of treatment fairly well.  She has chronic, intermittent joint pains likely related to underlying arthritis.  As it pertains to her metastatic breast cancer, she denies having any new symptoms or findings which concern her for overt signs of disease progression.    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  The patient received 6 cycles of Taxotere/Herceptin/Perjeta before being switched to maintenance Perjeta/Herceptin, for which she took 73 cycles of treatment.  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  This all happened in the setting of a prolonged treatment break from TDM-1.  Since recurrence was found, the patient has been on Enhertu since May 2022.  VITALS:  There were no vitals taken for this visit.  Wt Readings from Last 3 Encounters:  02/12/23 152 lb 1.9 oz (69 kg)  02/08/23 152 lb 14.4 oz (69.4 kg)  01/18/23 152 lb 12.8 oz (69.3 kg)    There is no height or weight on file to calculate BMI.  Performance status (ECOG): 1 - Symptomatic but completely ambulatory  PHYSICAL EXAM:  Physical  Exam Constitutional:      General: She is not in acute distress.    Appearance: Normal appearance. She is normal weight.  HENT:     Head: Normocephalic and atraumatic.  Eyes:     General: No scleral icterus.    Extraocular Movements: Extraocular movements intact.     Conjunctiva/sclera: Conjunctivae normal.     Pupils: Pupils are equal, round, and reactive to light.  Cardiovascular:     Rate and Rhythm: Normal rate and regular rhythm.     Pulses: Normal pulses.     Heart sounds: Normal heart sounds. No murmur heard.    No friction rub. No gallop.  Pulmonary:     Effort: Pulmonary effort is normal. No respiratory distress.     Breath sounds: Normal breath sounds.  Abdominal:     General: Bowel sounds are normal. There is no distension.     Palpations: Abdomen is soft. There is no hepatomegaly, splenomegaly or mass.     Tenderness: There is no abdominal tenderness.  Musculoskeletal:        General: Normal range of motion.     Cervical back: Normal range of motion and neck supple.     Right lower leg: No edema.     Left lower leg: No edema.  Lymphadenopathy:     Cervical: No cervical adenopathy.  Skin:    General: Skin is warm and dry.  Neurological:     General: No focal deficit present.     Mental Status: She is alert and oriented to  person, place, and time. Mental status is at baseline.  Psychiatric:        Mood and Affect: Mood normal.        Behavior: Behavior normal.        Thought Content: Thought content normal.        Judgment: Judgment normal.   LABS:  Latest Reference Range & Units 02/08/23 00:00  WBC  2.5 (E)  RBC 3.87 - 5.11  3.30 ! (E)  Hemoglobin 12.0 - 16.0  10.5 ! (E)  HCT 36 - 46  31 ! (E)  Platelets 150 - 400 K/uL 173 (E)  NEUT#  1.40 (E)  !: Data is abnormal (E): External lab result  Latest Reference Range & Units 02/08/23 14:00  Sodium 135 - 145 mmol/L 140  Potassium 3.5 - 5.1 mmol/L 3.7  Chloride 98 - 111 mmol/L 111  CO2 22 - 32 mmol/L 23   Glucose 70 - 99 mg/dL 295 (H)  BUN 8 - 23 mg/dL 13  Creatinine 2.84 - 1.32 mg/dL 4.40  Calcium 8.9 - 10.2 mg/dL 8.9  Anion gap 5 - 15  6  Alkaline Phosphatase 38 - 126 U/L 90  Albumin 3.5 - 5.0 g/dL 3.2 (L)  AST 15 - 41 U/L 39  ALT 0 - 44 U/L 25  Total Protein 6.5 - 8.1 g/dL 7.1  Total Bilirubin 0.3 - 1.2 mg/dL 0.4  GFR, Est Non African American >60 mL/min >60  (H): Data is abnormally high (L): Data is abnormally low  ASSESSMENT & PLAN:  Assessment/Plan:  A 72 y.o. 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  She will proceed with her 32nd cycle of Enhertu next week.  Clinically, she continues to do very well.  I will see her back in 3 weeks before she heads into her 33rd cycle of Enhertu therapy.  The patient understands all the plans discussed today and is in agreement with them.    Genisis Sonnier Kirby Funk, MD

## 2023-03-09 ENCOUNTER — Telehealth: Payer: Self-pay | Admitting: Oncology

## 2023-03-09 NOTE — Telephone Encounter (Signed)
Patient has been scheduled. Aware of appt date and time     Scheduling Message Entered by Rennis Harding A on 03/08/2023 at  3:18 PM Priority: Routine <No visit type provided>  Department: [facility]  Provider:  Scheduling Notes:  Please give pt IV iron with Enhertu on 03-12-23  See back on 03-29-23 with labs

## 2023-03-10 ENCOUNTER — Encounter: Payer: Self-pay | Admitting: Oncology

## 2023-03-10 ENCOUNTER — Other Ambulatory Visit: Payer: Self-pay

## 2023-03-11 ENCOUNTER — Encounter: Payer: Self-pay | Admitting: Oncology

## 2023-03-11 MED FILL — Dexamethasone Sodium Phosphate Inj 100 MG/10ML: INTRAMUSCULAR | Qty: 1 | Status: AC

## 2023-03-12 ENCOUNTER — Inpatient Hospital Stay: Payer: Self-pay

## 2023-03-12 VITALS — BP 120/67 | HR 78 | Temp 98.0°F | Resp 16

## 2023-03-12 DIAGNOSIS — D509 Iron deficiency anemia, unspecified: Secondary | ICD-10-CM

## 2023-03-12 DIAGNOSIS — Z171 Estrogen receptor negative status [ER-]: Secondary | ICD-10-CM

## 2023-03-12 MED ORDER — SODIUM CHLORIDE 0.9% FLUSH: 10.0000 mL | INTRAVENOUS | Status: DC | PRN

## 2023-03-12 MED ORDER — HEPARIN SOD (PORK) LOCK FLUSH 100 UNIT/ML IV SOLN: 500.0000 [IU] | Freq: Once | INTRAVENOUS | Status: DC | PRN

## 2023-03-12 MED ORDER — PALONOSETRON HCL INJECTION 0.25 MG/5ML
0.2500 mg | Freq: Once | INTRAVENOUS | Status: AC
  Administered 2023-03-12: 0.25 mg via INTRAVENOUS
  Filled 2023-03-12: qty 5

## 2023-03-12 MED ORDER — SODIUM CHLORIDE 0.9 % IV SOLN
750.0000 mg | Freq: Once | INTRAVENOUS | Status: AC
  Administered 2023-03-12: 750 mg via INTRAVENOUS
  Filled 2023-03-12: qty 15

## 2023-03-12 MED ORDER — SODIUM CHLORIDE 0.9 % IV SOLN: Freq: Once | INTRAVENOUS | Status: DC

## 2023-03-12 MED ORDER — SODIUM CHLORIDE 0.9 % IV SOLN
10.0000 mg | Freq: Once | INTRAVENOUS | Status: AC
  Administered 2023-03-12: 10 mg via INTRAVENOUS
  Filled 2023-03-12: qty 10

## 2023-03-12 MED ORDER — ACETAMINOPHEN 325 MG PO TABS
650.0000 mg | ORAL_TABLET | Freq: Once | ORAL | Status: AC
  Administered 2023-03-12: 650 mg via ORAL
  Filled 2023-03-12: qty 2

## 2023-03-12 MED ORDER — FAM-TRASTUZUMAB DERUXTECAN-NXKI CHEMO 100 MG IV SOLR
3.6300 mg/kg | Freq: Once | INTRAVENOUS | Status: AC
  Administered 2023-03-12: 240 mg via INTRAVENOUS
  Filled 2023-03-12: qty 12

## 2023-03-12 MED ORDER — DIPHENHYDRAMINE HCL 25 MG PO CAPS
50.0000 mg | ORAL_CAPSULE | Freq: Once | ORAL | Status: AC
  Administered 2023-03-12: 50 mg via ORAL
  Filled 2023-03-12: qty 2

## 2023-03-12 MED ORDER — DEXTROSE 5 % IV SOLN: Freq: Once | INTRAVENOUS | Status: AC

## 2023-03-13 NOTE — Progress Notes (Signed)
Sent in request for DOS 03/19/2023 to [employer].

## 2023-03-18 MED FILL — Ferric Carboxymaltose IV Soln 750 MG/15ML (Fe Equivalent): INTRAVENOUS | Qty: 15 | Status: AC

## 2023-03-19 ENCOUNTER — Encounter: Payer: Self-pay | Admitting: Oncology

## 2023-03-19 ENCOUNTER — Inpatient Hospital Stay: Payer: Self-pay

## 2023-03-19 VITALS — BP 110/61 | HR 79 | Temp 98.3°F | Resp 16

## 2023-03-19 DIAGNOSIS — D509 Iron deficiency anemia, unspecified: Secondary | ICD-10-CM

## 2023-03-19 MED ORDER — ONDANSETRON HCL 4 MG/2ML IJ SOLN
8.0000 mg | Freq: Once | INTRAMUSCULAR | Status: AC
  Administered 2023-03-19: 8 mg via INTRAVENOUS
  Filled 2023-03-19: qty 4

## 2023-03-19 MED ORDER — SODIUM CHLORIDE 0.9 % IV SOLN
750.0000 mg | Freq: Once | INTRAVENOUS | Status: AC
  Administered 2023-03-19: 750 mg via INTRAVENOUS
  Filled 2023-03-19: qty 15

## 2023-03-19 MED ORDER — SODIUM CHLORIDE 0.9 % IV SOLN: Freq: Once | INTRAVENOUS | Status: AC

## 2023-03-19 MED ORDER — HEPARIN SOD (PORK) LOCK FLUSH 100 UNIT/ML IV SOLN
500.0000 [IU] | Freq: Once | INTRAVENOUS | Status: AC
  Administered 2023-03-19: 500 [IU] via INTRAVENOUS

## 2023-03-19 MED ORDER — SODIUM CHLORIDE FLUSH 0.9 % IV SOLN: 10.0000 mL | Freq: Once | INTRAVENOUS | Status: DC

## 2023-03-19 MED ORDER — POTASSIUM CHLORIDE 10 MEQ/100ML IV SOLN
10.0000 meq | Freq: Once | INTRAVENOUS | Status: AC
  Administered 2023-03-19: 10 meq via INTRAVENOUS
  Filled 2023-03-19: qty 100

## 2023-03-19 NOTE — Patient Instructions (Addendum)
Ferric Carboxymaltose Injection What is this medication? FERRIC CARBOXYMALTOSE (FER ik kar BOX ee MAWL tose) treats low levels of iron in your body (iron deficiency anemia). Iron is a mineral that plays an important role in making red blood cells, which carry oxygen from your lungs to the rest of your body. This medicine may be used for other purposes; ask your health care provider or pharmacist if you have questions. COMMON BRAND NAME(S): Injectafer What should I tell my care team before I take this medication? They need to know if you have any of these conditions: High blood pressure Hyperparathyroidism Inflammatory bowel disease Low levels of vitamin D in your blood Previously received ferric carboxymaltose Problems absorbing certain vitamins or phosphate in your body An unusual or allergic reaction to iron, other medications, foods, dyes, or preservatives Pregnant or trying to get pregnant Breastfeeding How should I use this medication? This medication is injected into a vein. It is given by your care team in a hospital or clinic setting. Talk to your care team about the use of this medication in children. While it may be given to children as young as 1 year for selected conditions, precautions do apply. Overdosage: If you think you have taken too much of this medicine contact a poison control center or emergency room at once. NOTE: This medicine is only for you. Do not share this medicine with others. What if I miss a dose? Keep appointments for follow-up doses. It is important not to miss your dose. Call your care team if you are unable to keep an appointment. What may interact with this medication? Do not take this medication with any of the following: Deferoxamine Dimercaprol Other iron products This list may not describe all possible interactions. Give your health care provider a list of all the medicines, herbs, non-prescription drugs, or dietary supplements you use. Also tell  them if you smoke, drink alcohol, or use illegal drugs. Some items may interact with your medicine. What should I watch for while using this medication? Visit your care team for regular checks on your progress. Tell your care team if your symptoms do not start to get better or if they get worse. You may need blood work while you are taking this medication. You may need to eat more foods that contain iron. Talk to your care team. Foods that contain iron include whole grains/cereals, dried fruits, beans, or peas, leafy green vegetables, and organ meats (liver, kidney). What side effects may I notice from receiving this medication? Side effects that you should report to your care team as soon as possible: Allergic reactions--skin rash, itching, hives, swelling of the face, lips, tongue, or throat Increase in blood pressure Low blood pressure--dizziness, feeling faint or lightheaded, blurry vision Low phosphorus level--fatigue, muscle weakness or pain, bone or joint pain, bone fractures Shortness of breath Side effects that usually do not require medical attention (report to your care team if they continue or are bothersome): Flushing Headache Nausea Pain, redness, or irritation at injection site Vomiting This list may not describe all possible side effects. Call your doctor for medical advice about side effects. You may report side effects to FDA at 1-800-FDA-1088. Where should I keep my medication? This medication is given in a hospital or clinic. It will not be stored at home. NOTE: This sheet is a summary. It may not cover all possible information. If you have questions about this medicine, talk to your doctor, pharmacist, or health care provider.  2023 Elsevier/Gold   Standard (2020-11-11 00:00:00)  

## 2023-03-26 NOTE — Progress Notes (Signed)
Sent in request for DOS 03/28/2023 to [employer].

## 2023-03-28 ENCOUNTER — Inpatient Hospital Stay: Payer: Self-pay

## 2023-03-28 ENCOUNTER — Inpatient Hospital Stay: Payer: Self-pay | Admitting: Oncology

## 2023-03-29 NOTE — Progress Notes (Signed)
Sent in request for DOS 04/02/2023 and 04/04/2023 to [employer].

## 2023-03-30 ENCOUNTER — Other Ambulatory Visit: Payer: Self-pay

## 2023-04-02 ENCOUNTER — Inpatient Hospital Stay: Payer: Self-pay

## 2023-04-02 ENCOUNTER — Encounter: Payer: Self-pay | Admitting: Dietician

## 2023-04-02 ENCOUNTER — Ambulatory Visit: Payer: Self-pay

## 2023-04-02 ENCOUNTER — Inpatient Hospital Stay: Payer: Self-pay | Attending: Oncology | Admitting: Hematology and Oncology

## 2023-04-02 ENCOUNTER — Encounter: Payer: Self-pay | Admitting: Hematology and Oncology

## 2023-04-02 ENCOUNTER — Ambulatory Visit: Payer: Self-pay | Admitting: Dietician

## 2023-04-02 VITALS — BP 97/71 | HR 63 | Temp 97.9°F | Resp 20 | Ht 61.0 in | Wt 142.6 lb

## 2023-04-02 DIAGNOSIS — C7931 Secondary malignant neoplasm of brain: Secondary | ICD-10-CM | POA: Insufficient documentation

## 2023-04-02 DIAGNOSIS — C50412 Malignant neoplasm of upper-outer quadrant of left female breast: Secondary | ICD-10-CM

## 2023-04-02 DIAGNOSIS — C50919 Malignant neoplasm of unspecified site of unspecified female breast: Secondary | ICD-10-CM | POA: Insufficient documentation

## 2023-04-02 DIAGNOSIS — Z5112 Encounter for antineoplastic immunotherapy: Secondary | ICD-10-CM | POA: Insufficient documentation

## 2023-04-02 DIAGNOSIS — Z171 Estrogen receptor negative status [ER-]: Secondary | ICD-10-CM

## 2023-04-02 DIAGNOSIS — I890 Lymphedema, not elsewhere classified: Secondary | ICD-10-CM | POA: Insufficient documentation

## 2023-04-02 DIAGNOSIS — Z170 Estrogen receptor positive status [ER+]: Secondary | ICD-10-CM | POA: Insufficient documentation

## 2023-04-02 LAB — HEPATIC FUNCTION PANEL
ALT: 34 U/L (ref 7–35)
AST: 51 — AB (ref 13–35)
Alkaline Phosphatase: 141 — AB (ref 25–125)
Bilirubin, Total: 0.3

## 2023-04-02 LAB — CBC AND DIFFERENTIAL
HCT: 33 — AB (ref 36–46)
Hemoglobin: 10.8 — AB (ref 12.0–16.0)
MCV: 93 (ref 81–99)
Neutrophils Absolute: 2.87
Platelets: 223 10*3/uL (ref 150–400)
WBC: 4.1

## 2023-04-02 LAB — BASIC METABOLIC PANEL
BUN: 11 (ref 4–21)
CO2: 19 (ref 13–22)
Chloride: 110 — AB (ref 99–108)
Creatinine: 0.5 (ref 0.5–1.1)
EGFR (Non-African Amer.): >60
Glucose: 108
Potassium: 3.9 mEq/L (ref 3.5–5.1)
Sodium: 140 (ref 137–147)

## 2023-04-02 LAB — CBC: RBC: 3.55 — AB (ref 3.87–5.11)

## 2023-04-02 LAB — COMPREHENSIVE METABOLIC PANEL
Albumin: 3.9 (ref 3.5–5.0)
Calcium: 9.2 (ref 8.7–10.7)

## 2023-04-02 NOTE — Progress Notes (Signed)
Nutrition Assessment Met with patient at cancer center with aid of interpretive services.  Reason for Assessment: MST screen for weight loss.    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 She has been followed by Dr. Melvyn Neth.  She has PMHx that includes brain tumor, bleeding ulcer, IDA, steroid induced hyperglycemia.  She reports for past month and half she has been having nausea, vomiting, pain associate with ulcer as well as diarrhea past 2 days in the morning but it goes away as soon as she eats some food.  Meds: no longer taking any vitamins, IV iron injections, feeling better with omeprazole,   Labs: 03/08/23  reviewed, labs taken today   Nutrition Focused Physical Exam:   Orbital Region: Moderate depletion Buccal Region: Moderate depletion Upper Arm Region: WNL Thoracic and Lumbar Region: WNL Temple Region: Mild depletion Clavicle Bone Region: WNL Shoulder and Acromion Bone Region: WNL Scapular Bone Region: WNL Dorsal Hand: Right moderate depletion (left some edema and swelling in arm and hand) Patellar Region: WNL Anterior Thigh Region: WNL Posterior Calf Region: WNL Edema (RD assessment): edema noted left arm Hair: no concern Eyes: no concern Mouth: no concern Skin: no concern Nails: no concern  Anthropometrics:   Height: 61" Weight:  04/02/23  140.6# 03/08/23  145# 02/08/23  152.7# UBW: 150# BMI: 26.6   NUTRITION DIAGNOSIS: Inadequate PO intake to meet increased nutrient needs, r/t cancer diagnosis   MALNUTRITION DIAGNOSIS: moderate malnutrition of acute illness   INTERVENTION:  Relayed that nutrition services are wrap around service provided at no charge and encouraged continued communication if experiencing continued weight loss or any nutritional impact symptoms (NIS). Educated on importance of adequate calorie and  protein energy intake  with nutrient dense foods when possible to maintain weight/strength Discussed ways to add calories/protein to foods (adding cheese, cooking with butter, creamy sauces/gravy) - shake recipes given   Encouraged small frequent feeds and trying to eat 6 small meals Suggested oral nutrition supplements between meals, provided samples of Vanilla Orgain, and Ensure Complete (Vanilla and Strawberry) with coupons Discussed strategies for managing nausea and diarrhea. Provided ACS Spanish Nutrition book with contact information    MONITORING, EVALUATION, GOAL: weight, PO intake, Nutrition Impact Symptoms, labs Goal is weight maintenance  Next Visit: Remote in 2 weeks to address any potential questions  Gennaro Africa, RDN, LDN Registered Dietitian, [facility] Part Time Remote (Usual office hours: Tuesday-Thursday) Cell: [phone]

## 2023-04-02 NOTE — Progress Notes (Cosign Needed)
[facility]  [address] [place],  [place]  [address] [phone]  Clinic Day:  04/02/2023  Referring physician: Leanna Sato, MD   HISTORY OF PRESENT ILLNESS:  The patient is a 72 y.o. 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  She also had a subcarinal lymph node for which scans have consistently shown a positive response to therapy. She comes in today to be evaluated before heading into her 34th cycle of Enhertu. The patient claims that she tolerated her 33rd cycle of treatment fairly well.  She was seen in the [facility] 4 days after her last treatment with cough and weakness.  Chest x-ray was clear.  She was treated for lower respiratory infection with doxycycline.  She states her cough and fever have resolved.  She also had an E. coli urinary tract infection.  She states her urinary symptoms have resolved as well.  She recently completed a course of injectable iron to replenish her iron stores and improve her hemoglobin.  She saw Dr. Jennye Boroughs for evaluation.  EGD revealed erosive esophagitis, with some oozing of blood, as well as a small erosion inside of the large hiatal hernia.  He recommended continuing pantoprazole daily.  She denies continued dark stool.  She has had mild diarrhea for 2 days.  As it pertains to her metastatic breast cancer, she denies having any new symptoms or findings which concern her for overt signs of disease progression.  She reports continued lymphedema of the left arm, but has not seen the lymphedema specialist.   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  The patient received 6 cycles of Taxotere/Herceptin/Perjeta before being switched to maintenance Perjeta/Herceptin, for which she took 73 cycles of  treatment.  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  This all happened in the setting of a prolonged treatment break from TDM-1.  Since recurrence was found, the patient has been on Enhertu since May 2022.     PHYSICAL EXAM:  Blood pressure 97/71, pulse 63, temperature 97.9 F (36.6 C), temperature source Oral, resp. rate 20, height 5\' 1"  (1.549 m), weight 142 lb 9.6 oz (64.7 kg), SpO2 97 %. Wt Readings from Last 3 Encounters:  04/02/23 142 lb 9.6 oz (64.7 kg)  03/08/23 145 lb (65.8 kg)  02/12/23 152 lb 1.9 oz (69 kg)   Body mass index is 26.94 kg/m.  Performance status (ECOG): 1 - Symptomatic but completely ambulatory  Physical Exam Vitals and nursing note reviewed.  Constitutional:      General: She is not in acute distress.    Appearance: Normal appearance.  HENT:     Head: Normocephalic and atraumatic.     Mouth/Throat:     Mouth: Mucous membranes are moist.     Pharynx: Oropharynx is clear. No oropharyngeal exudate or posterior oropharyngeal erythema.  Eyes:     General: No scleral icterus.    Extraocular Movements: Extraocular movements intact.     Conjunctiva/sclera: Conjunctivae normal.     Pupils: Pupils are equal, round, and reactive to light.  Cardiovascular:  Rate and Rhythm: Normal rate and regular rhythm.     Heart sounds: Normal heart sounds. No murmur heard.    No friction rub. No gallop.  Pulmonary:     Effort: Pulmonary effort is normal.     Breath sounds: Normal breath sounds. No wheezing, rhonchi or rales.  Abdominal:     General: Bowel sounds are decreased. There is distension (Mild).     Palpations: Abdomen is soft. There is no hepatomegaly, splenomegaly or mass.     Tenderness: There is no abdominal tenderness.      Comments: Mild tenderness across the mid abdomen  Musculoskeletal:        General: Swelling (Mild lymphedema of the left upper extremity) present. Normal range of motion.     Cervical back: Normal range of motion and neck supple. No tenderness.     Right lower leg: No edema.     Left lower leg: No edema.  Lymphadenopathy:     Cervical: No cervical adenopathy.     Upper Body:     Right upper body: No supraclavicular or axillary adenopathy.     Left upper body: No supraclavicular or axillary adenopathy.     Lower Body: No right inguinal adenopathy. No left inguinal adenopathy.  Skin:    General: Skin is warm and dry.     Coloration: Skin is not jaundiced.     Findings: No rash.  Neurological:     Mental Status: She is alert and oriented to person, place, and time.     Cranial Nerves: No cranial nerve deficit.  Psychiatric:        Mood and Affect: Mood normal.        Behavior: Behavior normal.        Thought Content: Thought content normal.     LABS:      Latest Ref Rng & Units 04/02/2023   12:00 AM 03/08/2023   12:00 AM 02/08/2023   12:00 AM  CBC  WBC  4.1     4.2     2.5      Hemoglobin 12.0 - 16.0 10.8     8.5     10.5      Hematocrit 36 - 46 33     26     31      Platelets 150 - 400 K/uL 223     507     173         This result is from an external source.      Latest Ref Rng & Units 04/02/2023   12:00 AM 03/08/2023    2:34 PM 02/08/2023    2:00 PM  CMP  Glucose 70 - 99 mg/dL  94  409   BUN 4 - 21 11     12  13    Creatinine 0.5 - 1.1 0.5     0.78  0.80   Sodium 137 - 147 140     137  140   Potassium 3.5 - 5.1 mEq/L 3.9     3.8  3.7   Chloride 99 - 108 110     107  111   CO2 13 - 22 19     21  23    Calcium 8.7 - 10.7 9.2     9.0  8.9   Total Protein 6.5 - 8.1 g/dL  7.8  7.1   Total Bilirubin 0.3 - 1.2 mg/dL  0.5  0.4   Alkaline Phos 25 - 125 141  86  90   AST 13 - 35 51     30  39   ALT 7 - 35 U/L 34     20  25      This result is from an external source.      No results found for: "CEA1", "CEA" / No results found for: "CEA1", "CEA" No results found for: "PSA1" No results found for: "JWJ191" No results found for: "CAN125"  No results found for: "TOTALPROTELP", "ALBUMINELP", "A1GS", "A2GS", "BETS", "BETA2SER", "GAMS", "MSPIKE", "SPEI" Lab Results  Component Value Date   TIBC 328 03/08/2023   TIBC 410 06/26/2022   FERRITIN 22 03/08/2023   FERRITIN 7 (L) 06/26/2022   IRONPCTSAT 6 (L) 03/08/2023   IRONPCTSAT 5 (L) 06/26/2022   No results found for: "LDH"  No results found for: "AFPTUMOR", "TOTALPROTELP", "ALBUMINELP", "A1GS", "A2GS", "BETS", "BETA2SER", "GAMS", "MSPIKE", "SPEI", "LDH", "CEA1", "CEA", "PSA1", "IGASERUM", "IGGSERUM", "IGMSERUM", "THGAB", "THYROGLB"  Review Flowsheet       Latest Ref Rng & Units 06/26/2022 03/08/2023  Oncology Labs  Ferritin 11 - 307 ng/mL 7  22   %SAT 10.4 - 31.8 % 5  6      STUDIES:  No results found.    ASSESSMENT & PLAN:   Assessment/Plan:  72 y.o. female with  metastatic HER2 Neu receptor positive breast cancer, status post a suboccipital brain metastasectomy in April 2022.  Clinically, she is doing well.  She will proceed with her 34th cycle of Enhertu next week.  She has had a good increase in her hemoglobin with the IV iron.  We will see her back in 3 weeks before she heads into her 34th cycle of Enhertu therapy.   I will refer her to the lymphedema specialist.  The patient understands all the plans discussed today and is in agreement with them.  She knows to contact our office if she develops concerns prior to her next appointment.     Adah Perl, PA-C   Physician Assistant [facility] [phone]

## 2023-04-03 ENCOUNTER — Telehealth: Payer: Self-pay

## 2023-04-03 ENCOUNTER — Other Ambulatory Visit: Payer: Self-pay

## 2023-04-03 MED FILL — Dexamethasone Sodium Phosphate Inj 100 MG/10ML: INTRAMUSCULAR | Qty: 1 | Status: AC

## 2023-04-03 NOTE — Addendum Note (Signed)
Addended by: Lianne Bushy on: 04/03/2023 02:43 PM   Modules accepted: Orders

## 2023-04-03 NOTE — Telephone Encounter (Signed)
-----   Message from Adah Perl, PA-C sent at 04/02/2023  3:21 PM EDT ----- Please refer to [facility] rehab for lymphedema of the left arm. Thanks

## 2023-04-03 NOTE — Telephone Encounter (Signed)
Referral faxed to [facility] rehab for lymphedema.

## 2023-04-04 ENCOUNTER — Inpatient Hospital Stay: Payer: Self-pay

## 2023-04-04 ENCOUNTER — Other Ambulatory Visit: Payer: Self-pay

## 2023-04-04 VITALS — BP 119/75 | HR 69 | Temp 98.1°F | Resp 18 | Ht 61.0 in | Wt 143.2 lb

## 2023-04-04 DIAGNOSIS — Z171 Estrogen receptor negative status [ER-]: Secondary | ICD-10-CM

## 2023-04-04 MED ORDER — SODIUM CHLORIDE 0.9 % IV SOLN
10.0000 mg | Freq: Once | INTRAVENOUS | Status: AC
  Administered 2023-04-04: 10 mg via INTRAVENOUS
  Filled 2023-04-04: qty 10

## 2023-04-04 MED ORDER — HEPARIN SOD (PORK) LOCK FLUSH 100 UNIT/ML IV SOLN
500.0000 [IU] | Freq: Once | INTRAVENOUS | Status: AC | PRN
  Administered 2023-04-04: 500 [IU]

## 2023-04-04 MED ORDER — DEXTROSE 5 % IV SOLN: Freq: Once | INTRAVENOUS | Status: AC

## 2023-04-04 MED ORDER — ACETAMINOPHEN 325 MG PO TABS
650.0000 mg | ORAL_TABLET | Freq: Once | ORAL | Status: AC
  Administered 2023-04-04: 650 mg via ORAL
  Filled 2023-04-04: qty 2

## 2023-04-04 MED ORDER — SODIUM CHLORIDE 0.9% FLUSH
10.0000 mL | INTRAVENOUS | Status: DC | PRN
  Administered 2023-04-04: 10 mL

## 2023-04-04 MED ORDER — DIPHENHYDRAMINE HCL 25 MG PO CAPS
50.0000 mg | ORAL_CAPSULE | Freq: Once | ORAL | Status: AC
  Administered 2023-04-04: 50 mg via ORAL
  Filled 2023-04-04: qty 2

## 2023-04-04 MED ORDER — PALONOSETRON HCL INJECTION 0.25 MG/5ML
0.2500 mg | Freq: Once | INTRAVENOUS | Status: AC
  Administered 2023-04-04: 0.25 mg via INTRAVENOUS
  Filled 2023-04-04: qty 5

## 2023-04-04 MED ORDER — FAM-TRASTUZUMAB DERUXTECAN-NXKI CHEMO 100 MG IV SOLR
3.6300 mg/kg | Freq: Once | INTRAVENOUS | Status: AC
  Administered 2023-04-04: 240 mg via INTRAVENOUS
  Filled 2023-04-04: qty 12

## 2023-04-04 NOTE — Patient Instructions (Signed)
Instrucciones al darle de alta: Discharge Instructions Gracias por elegir al [facility] para brindarle atencin mdica de oncologa y hematologa.   Si usted tiene una cita de laboratorio con el [facility], por favor vaya directamente al [facility] y regstrese en el rea de registro.   Use ropa cmoda y adecuada para tener fcil acceso a las vas del Portacath (acceso venoso de larga duracin) o la lnea PICC (catter central colocado por va perifrica).   Nos esforzamos por ofrecerle tiempo de calidad con su proveedor. Es posible que tenga que volver a programar su cita si llega tarde (15 minutos o ms).  El llegar tarde le afecta a usted y a otros pacientes cuyas citas son posteriores a la suya.  Adems, si usted falta a tres o ms citas sin avisar a la oficina, puede ser retirado(a) de la clnica a discrecin del proveedor.      Para las solicitudes de renovacin de recetas, pida a su farmacia que se ponga en contacto con nuestra oficina y deje que transcurran 72 horas para que se complete el proceso de las renovaciones.    Hoy usted recibi los siguientes agentes de quimioterapia e/o inmunoterapia Enhertu      Para ayudar a prevenir las nuseas y los vmitos despus de su tratamiento, le recomendamos que tome su medicamento para las nuseas segn las indicaciones.  LOS SNTOMAS QUE DEBEN COMUNICARSE INMEDIATAMENTE SE INDICAN A CONTINUACIN: *FIEBRE SUPERIOR A 100.4 F (38 C) O MS *ESCALOFROS O SUDORACIN *NUSEAS Y VMITOS QUE NO SE CONTROLAN CON EL MEDICAMENTO PARA LAS NUSEAS *DIFICULTAD INUSUAL PARA RESPIRAR  *MORETONES O HEMORRAGIAS NO HABITUALES *PROBLEMAS URINARIOS (dolor o ardor al orinar o frecuencia para orinar) *PROBLEMAS INTESTINALES (diarrea inusual, estreimiento, dolor cerca del ano) SENSIBILIDAD EN LA BOCA Y EN LA GARGANTA CON O SIN LA PRESENCIA DE LCERAS (dolor de garganta, llagas en la boca o dolor de muelas/dientes) ERUPCIN,  HINCHAZN O DOLORES INUSUALES FLUJO VAGINAL INUSUAL O PICAZN/RASQUIA    Los puntos marcados con un asterisco ( *) indican una posible emergencia y debe hacer un seguimiento tan pronto como le sea posible o vaya al Departamento de Emergencias si se le presenta algn problema.  Por favor, muestre la TARJETA DE ADVERTENCIA DE QUIMIOTERAPIA O LA TARJETA DE ADVERTENCIA DE INMUNOTERAPIA al registrarse en el Departamento de Emergencias y a la enfermera de triaje.  Si tiene preguntas despus de su visita o necesita cancelar o volver a programar su cita, por favor pngase en contacto con [facility]  Dept: [phone]  y siga las instrucciones. Las horas de oficina son de 8:00 a.m. a 4:30 p.m. de lunes a viernes. Por favor, tenga en cuenta que los mensajes de voz que se dejan despus de las 4:00 p.m. posiblemente no se devolvern hasta el siguiente da de trabajo.  Cerramos los fines de semana y los das festivos importantes. En todo momento tiene acceso a una enfermera para preguntas urgentes. Por favor, llame al nmero principal de la clnica Dept: [phone] y siga las instrucciones.   Para cualquier pregunta que no sea de carcter urgente, tambin puede ponerse en contacto con su proveedor utilizando MyChart. Ahora ofrecemos visitas electrnicas para cualquier persona mayor de 18 aos que solicite atencin mdica en lnea para los sntomas que no sean urgentes. Para ms detalles vaya a [email or web address].   Tambin puede bajar la aplicacin de MyChart! Vaya a la tienda de aplicaciones, busque "MyChart", abra la aplicacin,   seleccione [place], e ingrese con su nombre de usuario y la contrasea de MyChart.   

## 2023-04-08 ENCOUNTER — Telehealth: Payer: Self-pay | Admitting: Oncology

## 2023-04-08 NOTE — Telephone Encounter (Signed)
Echocardiogram Appt: 04/18/23 Scheduled for 1pm ;Checking in @ 12:30 @ [facility]   Pt's daughter notified of appt info

## 2023-04-15 NOTE — Progress Notes (Signed)
Sent in request for DOS 04/18/2023 to [employer]

## 2023-04-17 ENCOUNTER — Ambulatory Visit: Payer: Self-pay | Admitting: Dietician

## 2023-04-17 ENCOUNTER — Telehealth: Payer: Self-pay | Admitting: Dietician

## 2023-04-17 NOTE — Progress Notes (Signed)
Nutrition Follow Up:  Called patient at her mobile# with aid of interpretive services (language line Maurine Minister  [phone]).  Interpreter asked if patient had questions or concerns since last nutrition consult.  She wanted to know if she had to gain weight. She also asked if it was safe to drink turmeric or ginger.  She is no longer having any trouble with nausea, and bowels are regular.  Her bowels were loose and watery each morning for 10 days but she said she's regular now.  She states she enjoyed all ONS samples provided, especially liked the Orgain.  Meds: no longer taking any vitamins, IV iron injections, feeling better with omeprazole,    Labs: 04/02/23  Hgb 10.8 (improving)   Anthropometrics: Patient weight up 1#    Height: 61" Weight:  04/04/23   143.3# 04/02/23  142.6# 03/08/23  145# 02/08/23  152.7# UBW: 150# BMI: 27.07     NUTRITION DIAGNOSIS: Inadequate PO intake to meet increased nutrient needs, r/t cancer diagnosis.  Improved per patient responses and weight increase.     MALNUTRITION DIAGNOSIS: moderate malnutrition of acute illness     INTERVENTION:   Addressed questions regarding safety of ginger and turmeric as food or additive or spice.  Encouraged her to reach out if she needed ONS coupons Encouraged continued attention to nutrient dense foods and weight maintenance.    MONITORING, EVALUATION, GOAL: weight, PO intake, Nutrition Impact Symptoms, labs Goal is weight maintenance.   Next Visit: PRN at patient or provider request. Encouraged her to have [name] or her daughter schedule a follow up if she has future concerns.    Gennaro Africa, RDN, LDN Registered Dietitian, [facility] Part Time Remote (Usual office hours: Tuesday-Thursday) Cell: [phone]

## 2023-04-17 NOTE — Telephone Encounter (Signed)
Called patient at her mobile# with aid of interpretive services(language line Onalee Hua.  [phone]) and it made strange sound, called home# got daughter who said patient was expecting call and verified best#  [phone].  Byrd Hesselbach was going to send a message to have patient pick up.  Had interpreter redial and didn't connect, again got the strange sound.  Gennaro Africa, RDN, LDN Registered Dietitian, [facility] Part Time Remote (Usual office hours: Tuesday-Thursday) Mobile: [phone] Remote Office: [phone]

## 2023-04-18 DIAGNOSIS — I361 Nonrheumatic tricuspid (valve) insufficiency: Secondary | ICD-10-CM

## 2023-04-18 NOTE — Progress Notes (Signed)
Sent in request for DOS 04/23/2023 and 04/25/2023 to [employer].

## 2023-04-22 NOTE — Progress Notes (Signed)
[facility]  [address] [place],  [place]  [address] [phone]  Clinic Day:  04/23/2023  HISTORY OF PRESENT ILLNESS:  The patient is a 72 y.o. 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  She also had a subcarinal lymph node for which scans have consistently shown a positive response to therapy. She comes in today to be evaluated before heading into her 35th cycle of Enhertu. The patient claims that she tolerated her 34th cycle of treatment fairly well.  As it pertains to her metastatic breast cancer, she denies having any new symptoms or findings which concern her for overt signs of disease progression.    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  The patient received 6 cycles of Taxotere/Herceptin/Perjeta before being switched to maintenance Perjeta/Herceptin, for which she took 73 cycles of treatment.  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  This all happened in the setting of a prolonged treatment break from TDM-1.  Since recurrence was found, the patient has been on Enhertu since May 2022.  VITALS:  Blood pressure 132/75, pulse 67, temperature 98.3 F (36.8 C), resp. rate 14, height 5\' 1"  (1.549 m), weight 143 lb 4.8 oz (65 kg), SpO2 96 %.  Wt Readings from Last 3 Encounters:  04/23/23 143 lb 4.8 oz (65 kg)  04/04/23 143 lb 4 oz (65 kg)  04/02/23 142 lb 9.6 oz (64.7 kg)    Body mass index is 27.08 kg/m.  Performance status (ECOG): 1 - Symptomatic but completely ambulatory  PHYSICAL EXAM:  Physical  Exam Constitutional:      General: She is not in acute distress.    Appearance: Normal appearance. She is normal weight.  HENT:     Head: Normocephalic and atraumatic.  Eyes:     General: No scleral icterus.    Extraocular Movements: Extraocular movements intact.     Conjunctiva/sclera: Conjunctivae normal.     Pupils: Pupils are equal, round, and reactive to light.  Cardiovascular:     Rate and Rhythm: Normal rate and regular rhythm.     Pulses: Normal pulses.     Heart sounds: Normal heart sounds. No murmur heard.    No friction rub. No gallop.  Pulmonary:     Effort: Pulmonary effort is normal. No respiratory distress.     Breath sounds: Normal breath sounds.  Abdominal:     General: Bowel sounds are normal. There is no distension.     Palpations: Abdomen is soft. There is no hepatomegaly, splenomegaly or mass.     Tenderness: There is no abdominal tenderness.  Musculoskeletal:        General: Normal range of motion.     Cervical back: Normal range of motion and neck supple.     Right lower leg: No edema.     Left lower leg: No edema.  Lymphadenopathy:     Cervical: No cervical adenopathy.  Skin:    General: Skin is warm and dry.  Neurological:     General: No focal deficit present.     Mental Status: She is alert  and oriented to person, place, and time. Mental status is at baseline.  Psychiatric:        Mood and Affect: Mood normal.        Behavior: Behavior normal.        Thought Content: Thought content normal.        Judgment: Judgment normal.    LABS:  Latest Reference Range & Units 04/23/23 00:00  WBC  4.0 (E)  RBC 3.87 - 5.11  3.88 (E)  Hemoglobin 12.0 - 16.0  12.3 (E)  HCT 36 - 46  37 (E)  Platelets 150 - 400 K/uL 133 ! (E)  NEUT#  2.96 (E)  !: Data is abnormal (E): External lab result  ASSESSMENT & PLAN:  Assessment/Plan:  A 72 y.o. female with metastatic HER2 Neu receptor positive breast cancer, status post a suboccipital brain metastatecomy in April  2022.  She will proceed with her 35th cycle of Enhertu this week. Of note, her hemoglobin looks much better today, likely related to her recent IV iron and GI giving her a PPI for her upper GI pathology.  Clinically, she is doing well.  I will see her back in 3 weeks before she heads into her 36th cycle of Enhertu therapy.  The patient understands all the plans discussed today and is in agreement with them.    Santa Abdelrahman Kirby Funk, MD

## 2023-04-23 ENCOUNTER — Ambulatory Visit: Payer: Self-pay | Admitting: Hematology and Oncology

## 2023-04-23 ENCOUNTER — Inpatient Hospital Stay: Payer: Self-pay

## 2023-04-23 ENCOUNTER — Inpatient Hospital Stay (HOSPITAL_BASED_OUTPATIENT_CLINIC_OR_DEPARTMENT_OTHER): Payer: Self-pay | Admitting: Oncology

## 2023-04-23 ENCOUNTER — Other Ambulatory Visit: Payer: Self-pay

## 2023-04-23 DIAGNOSIS — C50412 Malignant neoplasm of upper-outer quadrant of left female breast: Secondary | ICD-10-CM

## 2023-04-23 DIAGNOSIS — Z171 Estrogen receptor negative status [ER-]: Secondary | ICD-10-CM

## 2023-04-23 LAB — CMP (CANCER CENTER ONLY)
ALT: 35 U/L (ref 0–44)
AST: 44 U/L — ABNORMAL HIGH (ref 15–41)
Albumin: 3.7 g/dL (ref 3.5–5.0)
Alkaline Phosphatase: 126 U/L (ref 38–126)
Anion gap: 7 (ref 5–15)
BUN: 13 mg/dL (ref 8–23)
CO2: 22 mmol/L (ref 22–32)
Calcium: 9.4 mg/dL (ref 8.9–10.3)
Chloride: 110 mmol/L (ref 98–111)
Creatinine: 0.76 mg/dL (ref 0.44–1.00)
GFR, Estimated: >60 mL/min (ref >60)
Glucose, Bld: 108 mg/dL — ABNORMAL HIGH (ref 70–99)
Potassium: 3.5 mmol/L (ref 3.5–5.1)
Sodium: 139 mmol/L (ref 135–145)
Total Bilirubin: 0.4 mg/dL (ref 0.3–1.2)
Total Protein: 7.6 g/dL (ref 6.5–8.1)

## 2023-04-23 LAB — URINALYSIS, COMPLETE (UACMP) WITH MICROSCOPIC
Bilirubin Urine: NEGATIVE
Glucose, UA: NEGATIVE mg/dL
Ketones, ur: NEGATIVE mg/dL
Leukocytes,Ua: NEGATIVE
Nitrite: NEGATIVE
Protein, ur: NEGATIVE mg/dL
Specific Gravity, Urine: 1.016 (ref 1.005–1.030)
pH: 5 (ref 5.0–8.0)

## 2023-04-23 LAB — CBC AND DIFFERENTIAL
HCT: 37 (ref 36–46)
Hemoglobin: 12.3 (ref 12.0–16.0)
Neutrophils Absolute: 2.96
Platelets: 133 10*3/uL — AB (ref 150–400)
WBC: 4

## 2023-04-23 LAB — CBC: RBC: 3.88 (ref 3.87–5.11)

## 2023-04-24 ENCOUNTER — Other Ambulatory Visit: Payer: Self-pay

## 2023-04-24 ENCOUNTER — Other Ambulatory Visit: Payer: Self-pay | Admitting: Pharmacist

## 2023-04-24 ENCOUNTER — Other Ambulatory Visit: Payer: Self-pay | Admitting: Oncology

## 2023-04-24 DIAGNOSIS — C50412 Malignant neoplasm of upper-outer quadrant of left female breast: Secondary | ICD-10-CM

## 2023-04-24 MED FILL — Dexamethasone Sodium Phosphate Inj 100 MG/10ML: INTRAMUSCULAR | Qty: 1 | Status: AC

## 2023-04-25 ENCOUNTER — Inpatient Hospital Stay: Payer: Self-pay

## 2023-04-25 VITALS — BP 116/63 | HR 60 | Temp 98.0°F | Resp 18 | Ht 61.0 in | Wt 143.1 lb

## 2023-04-25 DIAGNOSIS — Z171 Estrogen receptor negative status [ER-]: Secondary | ICD-10-CM

## 2023-04-25 MED ORDER — DIPHENHYDRAMINE HCL 25 MG PO CAPS
50.0000 mg | ORAL_CAPSULE | Freq: Once | ORAL | Status: AC
  Administered 2023-04-25: 50 mg via ORAL
  Filled 2023-04-25: qty 2

## 2023-04-25 MED ORDER — HEPARIN SOD (PORK) LOCK FLUSH 100 UNIT/ML IV SOLN: 500.0000 [IU] | Freq: Once | INTRAVENOUS | Status: DC | PRN

## 2023-04-25 MED ORDER — FAM-TRASTUZUMAB DERUXTECAN-NXKI CHEMO 100 MG IV SOLR
3.6300 mg/kg | Freq: Once | INTRAVENOUS | Status: AC
  Administered 2023-04-25: 240 mg via INTRAVENOUS
  Filled 2023-04-25: qty 12

## 2023-04-25 MED ORDER — ACETAMINOPHEN 325 MG PO TABS
650.0000 mg | ORAL_TABLET | Freq: Once | ORAL | Status: AC
  Administered 2023-04-25: 650 mg via ORAL
  Filled 2023-04-25: qty 2

## 2023-04-25 MED ORDER — SODIUM CHLORIDE 0.9% FLUSH: 10.0000 mL | INTRAVENOUS | Status: DC | PRN

## 2023-04-25 MED ORDER — SODIUM CHLORIDE 0.9 % IV SOLN
10.0000 mg | Freq: Once | INTRAVENOUS | Status: AC
  Administered 2023-04-25: 10 mg via INTRAVENOUS
  Filled 2023-04-25: qty 10

## 2023-04-25 MED ORDER — DEXTROSE 5 % IV SOLN: Freq: Once | INTRAVENOUS | Status: AC

## 2023-04-25 MED ORDER — PALONOSETRON HCL INJECTION 0.25 MG/5ML
0.2500 mg | Freq: Once | INTRAVENOUS | Status: AC
  Administered 2023-04-25: 0.25 mg via INTRAVENOUS
  Filled 2023-04-25: qty 5

## 2023-04-25 NOTE — Patient Instructions (Signed)
Instrucciones al darle de alta: Discharge Instructions Gracias por elegir al [facility] para brindarle atencin mdica de oncologa y hematologa.   Si usted tiene una cita de laboratorio con el [facility], por favor vaya directamente al [facility] y regstrese en el rea de registro.   Use ropa cmoda y adecuada para tener fcil acceso a las vas del Portacath (acceso venoso de larga duracin) o la lnea PICC (catter central colocado por va perifrica).   Nos esforzamos por ofrecerle tiempo de calidad con su proveedor. Es posible que tenga que volver a programar su cita si llega tarde (15 minutos o ms).  El llegar tarde le afecta a usted y a otros pacientes cuyas citas son posteriores a la suya.  Adems, si usted falta a tres o ms citas sin avisar a la oficina, puede ser retirado(a) de la clnica a discrecin del proveedor.      Para las solicitudes de renovacin de recetas, pida a su farmacia que se ponga en contacto con nuestra oficina y deje que transcurran 72 horas para que se complete el proceso de las renovaciones.    Hoy usted recibi los siguientes agentes de quimioterapia e/o inmunoterapia Enhertu      Para ayudar a prevenir las nuseas y los vmitos despus de su tratamiento, le recomendamos que tome su medicamento para las nuseas segn las indicaciones.  LOS SNTOMAS QUE DEBEN COMUNICARSE INMEDIATAMENTE SE INDICAN A CONTINUACIN: *FIEBRE SUPERIOR A 100.4 F (38 C) O MS *ESCALOFROS O SUDORACIN *NUSEAS Y VMITOS QUE NO SE CONTROLAN CON EL MEDICAMENTO PARA LAS NUSEAS *DIFICULTAD INUSUAL PARA RESPIRAR  *MORETONES O HEMORRAGIAS NO HABITUALES *PROBLEMAS URINARIOS (dolor o ardor al orinar o frecuencia para orinar) *PROBLEMAS INTESTINALES (diarrea inusual, estreimiento, dolor cerca del ano) SENSIBILIDAD EN LA BOCA Y EN LA GARGANTA CON O SIN LA PRESENCIA DE LCERAS (dolor de garganta, llagas en la boca o dolor de muelas/dientes) ERUPCIN,  HINCHAZN O DOLORES INUSUALES FLUJO VAGINAL INUSUAL O PICAZN/RASQUIA    Los puntos marcados con un asterisco ( *) indican una posible emergencia y debe hacer un seguimiento tan pronto como le sea posible o vaya al Departamento de Emergencias si se le presenta algn problema.  Por favor, muestre la TARJETA DE ADVERTENCIA DE QUIMIOTERAPIA O LA TARJETA DE ADVERTENCIA DE INMUNOTERAPIA al registrarse en el Departamento de Emergencias y a la enfermera de triaje.  Si tiene preguntas despus de su visita o necesita cancelar o volver a programar su cita, por favor pngase en contacto con [facility]  Dept: [phone]  y siga las instrucciones. Las horas de oficina son de 8:00 a.m. a 4:30 p.m. de lunes a viernes. Por favor, tenga en cuenta que los mensajes de voz que se dejan despus de las 4:00 p.m. posiblemente no se devolvern hasta el siguiente da de trabajo.  Cerramos los fines de semana y los das festivos importantes. En todo momento tiene acceso a una enfermera para preguntas urgentes. Por favor, llame al nmero principal de la clnica Dept: [phone] y siga las instrucciones.   Para cualquier pregunta que no sea de carcter urgente, tambin puede ponerse en contacto con su proveedor utilizando MyChart. Ahora ofrecemos visitas electrnicas para cualquier persona mayor de 18 aos que solicite atencin mdica en lnea para los sntomas que no sean urgentes. Para ms detalles vaya a [email or web address].   Tambin puede bajar la aplicacin de MyChart! Vaya a la tienda de aplicaciones, busque "MyChart", abra la aplicacin,   seleccione [place], e ingrese con su nombre de usuario y la contrasea de MyChart.   

## 2023-04-26 LAB — URINE CULTURE: Culture: >=100000 — AB

## 2023-05-06 ENCOUNTER — Encounter: Payer: Self-pay | Admitting: Oncology

## 2023-05-08 NOTE — Progress Notes (Signed)
Sent in request for DOS 05/14/2023 and 05/16/2023 to [employer].

## 2023-05-13 NOTE — Progress Notes (Unsigned)
[name]  [address] [place],  [place]  [address] [phone]  Clinic Day:  04/23/2023  HISTORY OF PRESENT ILLNESS:  The patient is a 72 y.o. 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  She also had a subcarinal lymph node for which scans have consistently shown a positive response to therapy. She comes in today to be evaluated before heading into her 36th cycle of Enhertu. The patient claims that she tolerated her 35th cycle of treatment fairly well.  Of note, she has felt much better after receiving IV iron in May 2024.  As it pertains to her metastatic breast cancer, she denies having any new symptoms or findings which concern her for overt signs of disease progression.    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  The patient received 6 cycles of Taxotere/Herceptin/Perjeta before being switched to maintenance Perjeta/Herceptin, for which she took 73 cycles of treatment.  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  This all happened in the setting of a prolonged treatment break from TDM-1.  Since recurrence was found, the patient has been on Enhertu since May 2022.  VITALS:  Blood pressure 124/71, pulse 62, temperature 98.4 F (36.9 C), resp. rate 14, height 5\' 1"  (1.549 m), weight 145 lb (65.8 kg), SpO2 96%.  Wt Readings from Last 3 Encounters:  05/14/23 145 lb (65.8 kg)  04/25/23 143 lb 1.9 oz (64.9 kg)  04/23/23 143 lb 4.8 oz (65 kg)    Body mass index is 27.4 kg/m.  Performance status (ECOG): 1 - Symptomatic but  completely ambulatory  PHYSICAL EXAM:  Physical Exam Constitutional:      General: She is not in acute distress.    Appearance: Normal appearance. She is normal weight.  HENT:     Head: Normocephalic and atraumatic.  Eyes:     General: No scleral icterus.    Extraocular Movements: Extraocular movements intact.     Conjunctiva/sclera: Conjunctivae normal.     Pupils: Pupils are equal, round, and reactive to light.  Cardiovascular:     Rate and Rhythm: Normal rate and regular rhythm.     Pulses: Normal pulses.     Heart sounds: Normal heart sounds. No murmur heard.    No friction rub. No gallop.  Pulmonary:     Effort: Pulmonary effort is normal. No respiratory distress.     Breath sounds: Normal breath sounds.  Abdominal:     General: Bowel sounds are normal. There is no distension.     Palpations: Abdomen is soft. There is no hepatomegaly, splenomegaly or mass.     Tenderness: There is no abdominal tenderness.  Musculoskeletal:        General: Normal range of motion.     Cervical back: Normal range of motion and neck supple.     Right lower leg: No edema.     Left lower leg: No edema.  Lymphadenopathy:     Cervical: No cervical adenopathy.  Skin:    General: Skin is warm and dry.  Neurological:     General: No focal deficit  present.     Mental Status: She is alert and oriented to person, place, and time. Mental status is at baseline.  Psychiatric:        Mood and Affect: Mood normal.        Behavior: Behavior normal.        Thought Content: Thought content normal.        Judgment: Judgment normal.    LABS:   ASSESSMENT & PLAN:  Assessment/Plan:  A 72 y.o. 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  She will proceed with her 36th cycle of Enhertu this week. Clinically, she is doing well.  I will see her back in 3 weeks before she heads into her 37th cycle of Enhertu therapy.  CT scans  of her chest/abdomen/pelvis will be done a day before her next visit to ascertain her new disease baseline after 36 cycles of Enhertu.  The patient understands all the plans discussed today and is in agreement with them.    Taqwa Deem Kirby Funk, MD

## 2023-05-14 ENCOUNTER — Inpatient Hospital Stay (HOSPITAL_BASED_OUTPATIENT_CLINIC_OR_DEPARTMENT_OTHER): Payer: Self-pay | Admitting: Oncology

## 2023-05-14 ENCOUNTER — Other Ambulatory Visit: Payer: Self-pay | Admitting: Oncology

## 2023-05-14 ENCOUNTER — Inpatient Hospital Stay: Payer: Self-pay | Attending: Oncology

## 2023-05-14 DIAGNOSIS — C50412 Malignant neoplasm of upper-outer quadrant of left female breast: Secondary | ICD-10-CM

## 2023-05-14 DIAGNOSIS — Z171 Estrogen receptor negative status [ER-]: Secondary | ICD-10-CM

## 2023-05-14 DIAGNOSIS — N390 Urinary tract infection, site not specified: Secondary | ICD-10-CM

## 2023-05-14 DIAGNOSIS — Z5112 Encounter for antineoplastic immunotherapy: Secondary | ICD-10-CM | POA: Insufficient documentation

## 2023-05-14 DIAGNOSIS — C7931 Secondary malignant neoplasm of brain: Secondary | ICD-10-CM | POA: Insufficient documentation

## 2023-05-14 DIAGNOSIS — Z79899 Other long term (current) drug therapy: Secondary | ICD-10-CM | POA: Insufficient documentation

## 2023-05-14 LAB — URINALYSIS, COMPLETE (UACMP) WITH MICROSCOPIC
Bilirubin Urine: NEGATIVE
Glucose, UA: NEGATIVE mg/dL
Ketones, ur: NEGATIVE mg/dL
Leukocytes,Ua: NEGATIVE
Nitrite: NEGATIVE
Protein, ur: NEGATIVE mg/dL
Specific Gravity, Urine: 1.013 (ref 1.005–1.030)
pH: 5 (ref 5.0–8.0)

## 2023-05-14 LAB — CMP (CANCER CENTER ONLY)
ALT: 47 U/L — ABNORMAL HIGH (ref 0–44)
AST: 50 U/L — ABNORMAL HIGH (ref 15–41)
Albumin: 3.8 g/dL (ref 3.5–5.0)
Alkaline Phosphatase: 149 U/L — ABNORMAL HIGH (ref 38–126)
Anion gap: 8 (ref 5–15)
BUN: 16 mg/dL (ref 8–23)
CO2: 20 mmol/L — ABNORMAL LOW (ref 22–32)
Calcium: 9.4 mg/dL (ref 8.9–10.3)
Chloride: 112 mmol/L — ABNORMAL HIGH (ref 98–111)
Creatinine: 0.66 mg/dL (ref 0.44–1.00)
GFR, Estimated: >60 mL/min (ref >60)
Glucose, Bld: 130 mg/dL — ABNORMAL HIGH (ref 70–99)
Potassium: 3.5 mmol/L (ref 3.5–5.1)
Sodium: 140 mmol/L (ref 135–145)
Total Bilirubin: 0.4 mg/dL (ref 0.3–1.2)
Total Protein: 7.9 g/dL (ref 6.5–8.1)

## 2023-05-14 LAB — CBC W DIFFERENTIAL (~~LOC~~ CC SCANNED REPORT)

## 2023-05-15 ENCOUNTER — Encounter: Payer: Self-pay | Admitting: Oncology

## 2023-05-15 ENCOUNTER — Other Ambulatory Visit: Payer: Self-pay

## 2023-05-15 LAB — CBC AND DIFFERENTIAL
HCT: 39 (ref 36–46)
Hemoglobin: 12.9 (ref 12.0–16.0)
Neutrophils Absolute: 2.21
Platelets: 134 10*3/uL — AB (ref 150–400)
WBC: 3.4

## 2023-05-15 LAB — CBC: RBC: 4.1 (ref 3.87–5.11)

## 2023-05-15 MED FILL — Dexamethasone Sodium Phosphate Inj 100 MG/10ML: INTRAMUSCULAR | Qty: 1 | Status: AC

## 2023-05-16 ENCOUNTER — Inpatient Hospital Stay: Payer: Self-pay

## 2023-05-16 VITALS — BP 117/68 | HR 67 | Resp 16 | Wt 145.1 lb

## 2023-05-16 DIAGNOSIS — Z171 Estrogen receptor negative status [ER-]: Secondary | ICD-10-CM

## 2023-05-16 MED ORDER — FAM-TRASTUZUMAB DERUXTECAN-NXKI CHEMO 100 MG IV SOLR
3.6300 mg/kg | Freq: Once | INTRAVENOUS | Status: AC
  Administered 2023-05-16: 240 mg via INTRAVENOUS
  Filled 2023-05-16: qty 12

## 2023-05-16 MED ORDER — PALONOSETRON HCL INJECTION 0.25 MG/5ML
0.2500 mg | Freq: Once | INTRAVENOUS | Status: AC
  Administered 2023-05-16: 0.25 mg via INTRAVENOUS
  Filled 2023-05-16: qty 5

## 2023-05-16 MED ORDER — SODIUM CHLORIDE 0.9 % IV SOLN
10.0000 mg | Freq: Once | INTRAVENOUS | Status: AC
  Administered 2023-05-16: 10 mg via INTRAVENOUS
  Filled 2023-05-16: qty 10

## 2023-05-16 MED ORDER — HEPARIN SOD (PORK) LOCK FLUSH 100 UNIT/ML IV SOLN
500.0000 [IU] | Freq: Once | INTRAVENOUS | Status: AC | PRN
  Administered 2023-05-16: 500 [IU]

## 2023-05-16 MED ORDER — ACETAMINOPHEN 325 MG PO TABS
650.0000 mg | ORAL_TABLET | Freq: Once | ORAL | Status: AC
  Administered 2023-05-16: 650 mg via ORAL
  Filled 2023-05-16: qty 2

## 2023-05-16 MED ORDER — DEXTROSE 5 % IV SOLN: Freq: Once | INTRAVENOUS | Status: AC

## 2023-05-16 MED ORDER — SODIUM CHLORIDE 0.9% FLUSH
10.0000 mL | INTRAVENOUS | Status: DC | PRN
  Administered 2023-05-16: 10 mL

## 2023-05-16 MED ORDER — DIPHENHYDRAMINE HCL 25 MG PO CAPS
50.0000 mg | ORAL_CAPSULE | Freq: Once | ORAL | Status: AC
  Administered 2023-05-16: 50 mg via ORAL
  Filled 2023-05-16: qty 2

## 2023-05-16 NOTE — Patient Instructions (Addendum)
Fam-Trastuzumab Deruxtecan Injection Qu es este medicamento? El FAM-TRASTUZUMAB DERUXTECAN trata algunos tipos de cncer. Acta bloqueando una protena que hace que las clulas cancerosas crezcan y se multipliquen. Esto ayuda a Neurosurgeon propagacin de las clulas cancerosas. Este medicamento puede ser utilizado para otros usos; si tiene alguna pregunta consulte con su proveedor de atencin mdica o con su farmacutico. MARCAS COMUNES: ENHERTU Qu le debo informar a mi profesional de la salud antes de tomar este medicamento? Necesitan saber si usted presenta alguno de los [employer] o situaciones: Enfermedad cardiaca Insuficiencia cardiaca Infeccin, especialmente infecciones virales, tales como varicela, fuegos labiales o herpes Enfermedad heptica Enfermedad pulmonar o respiratoria, tales como asma o EPOC Una reaccin alrgica o inusual al fam-trastuzumab deruxtecan, a otros medicamentos, alimentos, colorantes o conservantes Si est embarazada o buscando quedar embarazada Si est amamantando a un beb Cmo debo utilizar este medicamento? Este medicamento se inyecta en una vena. Su equipo de atencin lo [place] en un hospital o en un entorno clnico. Se le entregar una Gua del medicamento (MedGuide, su nombre en ingls) especial antes de cada tratamiento. Asegrese de leer esta informacin cada vez cuidadosamente. Hable con su equipo de atencin sobre el uso de este medicamento en nios. Puede requerir atencin especial. Sobredosis: Pngase en contacto inmediatamente con un centro toxicolgico o una sala de urgencia si usted cree que haya tomado demasiado medicamento. ATENCIN: [employer] es solo para usted. No comparta este medicamento con nadie. Qu sucede si me olvido de una dosis? Es importante no olvidar ninguna dosis. Llame a su equipo de atencin si no puede asistir a una cita. Qu puede interactuar con este medicamento? No se anticipan  interacciones. Puede ser que esta lista no menciona todas las posibles interacciones. Informe a su profesional de [employer] de [employer] productos a base de hierbas, medicamentos de [place] o suplementos nutritivos que est tomando. Si usted fuma, consume bebidas alcohlicas o si utiliza drogas ilegales, indqueselo tambin a su profesional de [employer]. Algunas sustancias pueden interactuar con su medicamento. A qu debo estar atento al usar [employer]? Visite a su equipo de atencin para que revise su evolucin peridicamente. Si los sntomas no comienzan a mejorar o si empeoran, consulte con su equipo de atencin. Se supervisar su estado de salud atentamente mientras reciba este medicamento. No debe quedar embarazada mientras est usando este medicamento o por 7 meses despus de dejar de usarlo. Las mujeres deben informar a su equipo de atencin si estn buscando quedar embarazadas o si creen que podran estar embarazadas. Los hombres no deben Media planner a Careers information officer estn recibiendo [employer] y [place] 4 meses despus de dejar de usarlo. Existe la posibilidad de que ocurran efectos secundarios graves en un beb sin nacer. Para obtener ms informacin, hable con su equipo de atencin. No debe amamantar a un beb mientras est usando este medicamento o durante 7 meses despus de la ltima dosis. Este medicamento ha causado recuentos de esperma reducidos en algunos hombres. Esto puede hacer ms difcil que un hombre embarace a Nurse, mental health. Hable con su equipo de atencin si le preocupa su fertilidad. Este medicamento podra aumentar el riesgo de moretones o sangrado. Llame a su equipo de atencin si observa sangrados inusuales. Proceda con cuidado al cepillar sus dientes, usar hilo dental o Chemical engineer palillos para los dientes, ya que podra contraer una infeccin o Geophysicist/field seismologist con mayor facilidad. Si recibe algn tratamiento dental, informe a su dentista que est [employer]  medicamento. Este medicamento puede resecarle los ojos y provocar visin borrosa. Si [place] lentes de contacto, puede sentir ciertas molestias. Las gotas lubricantes para los ojos pueden ser tiles. Si el problema no desaparece o es grave, consulte a su equipo de atencin. Este medicamento puede aumentar su riesgo de contraer una infeccin. Llame a su equipo de atencin si tiene fiebre, escalofros, dolor de garganta o cualquier otro sntoma de resfriado o gripe. No se trate usted mismo. Trate de no acercarse a personas que estn enfermas. Evite usar [employer] contienen aspirina, acetaminofeno, ibuprofeno, naproxeno o ketoprofeno, a menos que as lo indique su equipo de atencin. Estos medicamentos pueden ocultar la fiebre. Qu efectos secundarios puedo tener al [employer] este medicamento? Efectos secundarios que debe informar a su equipo de atencin tan pronto como sea posible: Reacciones alrgicas: erupcin cutnea, comezn/picazn, urticaria, hinchazn de la cara, los labios, la lengua o la garganta Tos seca, falta de aire o problemas para respirar Infeccin: fiebre, escalofros, tos, dolor de garganta, heridas que no sanan, dolor o problemas para Geographical information systems officer, sensacin general de molestia o Sports administrator cardiaca: falta de aire, hinchazn de los tobillos, los pies o las [place], aumento de peso repentino, debilidad o fatiga inusuales Sangrado o moretones inusuales Efectos secundarios que generalmente no requieren atencin mdica (debe informarlos a su equipo de atencin si persisten o si son molestos): Estreimiento Diarrea Cada del cabello Dolor muscular Nuseas Vmito Puede ser que esta lista no menciona todos los posibles efectos secundarios. Comunquese a su mdico por asesoramiento mdico [employer]. Usted puede informar los efectos secundarios a la FDA por telfono al 1-800-FDA-1088. Dnde debo guardar mi medicina? Este medicamento se administra en hospitales  o clnicas. No se guarda en su casa. ATENCIN: Este folleto es un resumen. Puede ser que no cubra toda la posible informacin. Si usted tiene preguntas acerca de esta medicina, consulte con su mdico, su farmacutico o su profesional de Radiographer, therapeutic.  2024 Elsevier/Gold Standard (2021-09-06 00:00:00)

## 2023-05-17 LAB — URINE CULTURE: Culture: >=100000 — AB

## 2023-05-17 NOTE — Progress Notes (Signed)
Sent in financial assistance application to [facility]

## 2023-05-28 NOTE — Progress Notes (Signed)
Sent in request for DOS 06/03/2023, 06/04/2023, and 06/06/2023 to [employer].

## 2023-05-29 ENCOUNTER — Encounter: Payer: Self-pay | Admitting: Oncology

## 2023-05-30 NOTE — Progress Notes (Deleted)
[facility]  [address] [place],  [place]  [address] [phone]  Clinic Day:  05/30/2023  Referring physician: Leanna Sato, MD   HISTORY OF PRESENT ILLNESS:  The patient is a 72 y.o. female with ***   PHYSICAL EXAM:  There were no vitals taken for this visit. Wt Readings from Last 3 Encounters:  05/16/23 145 lb 1.9 oz (65.8 kg)  05/14/23 145 lb (65.8 kg)  04/25/23 143 lb 1.9 oz (64.9 kg)   There is no height or weight on file to calculate BMI.  Performance status (ECOG): {CHL ONC [id number]  Physical Exam  LABS:      Latest Ref Rng & Units 05/14/2023   12:00 AM 04/23/2023   12:00 AM 04/02/2023   12:00 AM  CBC  WBC  3.4     4.0     4.1      Hemoglobin 12.0 - 16.0 12.9     12.3     10.8      Hematocrit 36 - 46 39     37     33      Platelets 150 - 400 K/uL 134     133     223         This result is from an external source.      Latest Ref Rng & Units 05/14/2023    1:27 PM 04/23/2023    4:29 PM 04/02/2023   12:00 AM  CMP  Glucose 70 - 99 mg/dL 244  010    BUN 8 - 23 mg/dL 16  13  11       Creatinine 0.44 - 1.00 mg/dL 2.72  5.36  0.5      Sodium 135 - 145 mmol/L 140  139  140      Potassium 3.5 - 5.1 mmol/L 3.5  3.5  3.9      Chloride 98 - 111 mmol/L 112  110  110      CO2 22 - 32 mmol/L 20  22  19       Calcium 8.9 - 10.3 mg/dL 9.4  9.4  9.2      Total Protein 6.5 - 8.1 g/dL 7.9  7.6    Total Bilirubin 0.3 - 1.2 mg/dL 0.4  0.4    Alkaline Phos 38 - 126 U/L 149  126  141      AST 15 - 41 U/L 50  44  51      ALT 0 - 44 U/L 47  35  34         This result is from an external source.     No results found for: "CEA1", "CEA" / No results found for: "CEA1", "CEA" No results found for: "PSA1" No results found for: "UYQ034" No results found for: "CAN125"  No results found for: "TOTALPROTELP", "ALBUMINELP", "A1GS", "A2GS", "BETS", "BETA2SER", "GAMS", "MSPIKE", "SPEI" Lab Results  Component Value Date   TIBC 328 03/08/2023    TIBC 410 06/26/2022   FERRITIN 22 03/08/2023   FERRITIN 7 (L) 06/26/2022   IRONPCTSAT 6 (L) 03/08/2023   IRONPCTSAT 5 (L) 06/26/2022   No results found for: "LDH"  No results found for: "AFPTUMOR", "TOTALPROTELP", "ALBUMINELP", "A1GS", "A2GS", "BETS", "BETA2SER", "GAMS", "MSPIKE", "SPEI", "LDH", "CEA1", "CEA", "PSA1", "IGASERUM", "IGGSERUM", "IGMSERUM", "THGAB", "THYROGLB"  Review Flowsheet       Latest Ref Rng & Units 06/26/2022 03/08/2023  Oncology Labs  Ferritin 11 - 307 ng/mL 7  22   %  SAT 10.4 - 31.8 % 5  6     Details             STUDIES:  No results found.    ASSESSMENT & PLAN:   Assessment/Plan:  72 y.o. female with ***  The patient understands all the plans discussed today and is in agreement with them.  She knows to contact our office if she develops concerns prior to her next appointment.     Adah Perl, PA-C   Physician Assistant [facility]  [phone]

## 2023-05-31 ENCOUNTER — Encounter: Payer: Self-pay | Admitting: Oncology

## 2023-06-03 ENCOUNTER — Telehealth: Payer: Self-pay | Admitting: Oncology

## 2023-06-03 NOTE — Telephone Encounter (Signed)
06/03/23 Spoke with patients daughter and rescheduled all appts.

## 2023-06-04 ENCOUNTER — Encounter: Payer: Self-pay | Admitting: Oncology

## 2023-06-04 ENCOUNTER — Inpatient Hospital Stay: Payer: Self-pay | Admitting: Hematology and Oncology

## 2023-06-05 ENCOUNTER — Encounter: Payer: Self-pay | Admitting: Oncology

## 2023-06-06 ENCOUNTER — Ambulatory Visit: Payer: Self-pay

## 2023-06-08 ENCOUNTER — Other Ambulatory Visit: Payer: Self-pay

## 2023-06-10 ENCOUNTER — Ambulatory Visit: Payer: Self-pay | Admitting: Oncology

## 2023-06-10 LAB — HEPATIC FUNCTION PANEL
ALT: 40 U/L — AB (ref 7–35)
AST: 55 — AB (ref 13–35)
Alkaline Phosphatase: 138 — AB (ref 25–125)
Bilirubin, Total: 0.8

## 2023-06-10 LAB — BASIC METABOLIC PANEL
BUN: 18 (ref 4–21)
CO2: 19 (ref 13–22)
Chloride: 109 — AB (ref 99–108)
Creatinine: 0.6 (ref 0.5–1.1)
Glucose: 107
Potassium: 3.8 mEq/L (ref 3.5–5.1)
Sodium: 139 (ref 137–147)

## 2023-06-10 LAB — CBC AND DIFFERENTIAL
HCT: 37 (ref 36–46)
Hemoglobin: 12.6 (ref 12.0–16.0)
Neutrophils Absolute: 2.45
Platelets: 118 10*3/uL — AB (ref 150–400)
WBC: 3.5

## 2023-06-10 LAB — CBC: RBC: 3.94 (ref 3.87–5.11)

## 2023-06-10 LAB — COMPREHENSIVE METABOLIC PANEL
Albumin: 3.9 (ref 3.5–5.0)
Calcium: 9.6 (ref 8.7–10.7)

## 2023-06-10 NOTE — Progress Notes (Signed)
[facility]  [address] [place],  [place]  [address] [phone]  Clinic Day:  06/11/2023  HISTORY OF PRESENT ILLNESS:  The patient is a 72 y.o. 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  She also had a subcarinal lymph node for which scans have consistently shown a positive response to therapy. She comes in today to go over her CT scans to ascertain her new disease baseline after 36 cycles of Enhertu. The patient claims to have tolerated her 36th cycle of treatment fairly well.  As it pertains to her metastatic breast cancer, she denies having any new symptoms or findings which concern her for overt signs of disease progression.    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  The patient received 6 cycles of Taxotere/Herceptin/Perjeta before being switched to maintenance Perjeta/Herceptin, for which she took 73 cycles of treatment.  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  This all happened in the setting of a prolonged treatment break from TDM-1.  Since recurrence was found, the patient has been on Enhertu since May 2022.  VITALS:  Blood pressure 122/74, pulse 75, temperature 97.7 F (36.5 C), resp. rate 14, height 5\' 1"  (1.549 m), weight 148 lb 1.6 oz (67.2 kg), SpO2 94%.  Wt Readings from Last 3 Encounters:  06/11/23 148 lb 1.6 oz (67.2 kg)  05/16/23 145 lb 1.9 oz (65.8 kg)  05/14/23 145 lb (65.8 kg)    Body mass index is 27.98 kg/m.  Performance status (ECOG): 1 - Symptomatic but completely ambulatory  PHYSICAL  EXAM:  Physical Exam Constitutional:      General: She is not in acute distress.    Appearance: Normal appearance. She is normal weight.  HENT:     Head: Normocephalic and atraumatic.  Eyes:     General: No scleral icterus.    Extraocular Movements: Extraocular movements intact.     Conjunctiva/sclera: Conjunctivae normal.     Pupils: Pupils are equal, round, and reactive to light.  Cardiovascular:     Rate and Rhythm: Normal rate and regular rhythm.     Pulses: Normal pulses.     Heart sounds: Normal heart sounds. No murmur heard.    No friction rub. No gallop.  Pulmonary:     Effort: Pulmonary effort is normal. No respiratory distress.     Breath sounds: Normal breath sounds.  Abdominal:     General: Bowel sounds are normal. There is no distension.     Palpations: Abdomen is soft. There is no hepatomegaly, splenomegaly or mass.     Tenderness: There is no abdominal tenderness.  Musculoskeletal:        General: Normal range of motion.     Cervical back: Normal range of motion and neck supple.     Right lower leg: No edema.     Left lower leg: No edema.  Lymphadenopathy:     Cervical: No cervical adenopathy.  Skin:    General: Skin is warm and dry.  Neurological:     General: No focal deficit present.     Mental Status:  She is alert and oriented to person, place, and time. Mental status is at baseline.  Psychiatric:        Mood and Affect: Mood normal.        Behavior: Behavior normal.        Thought Content: Thought content normal.        Judgment: Judgment normal.   SCANS:  CT scans of her chest/abdomen/pelvis revealed the following: FINDINGS: CT CHEST FINDINGS  Cardiovascular: Accessed right chest Port-A-Cath with tip in the SVC. Normal caliber thoracic aorta. No central pulmonary embolus on this nondedicated study. Normal size heart. No significant pericardial effusion/thickening.  Mediastinum/Nodes: No suspicious thyroid nodule. No pathologically enlarged  mediastinal, hilar or axillary lymph nodes. Moderate-sized hiatal hernia.  Lungs/Pleura: Scattered tiny pulmonary nodules are stable from prior examination. No new suspicious pulmonary nodules or masses. For reference:  -nodule in the right lung apex measures 3 mm on image 14/301, unchanged.  -inferior right upper lobe pulmonary nodule measures 4 mm on image 48/301, unchanged.  -posterior left upper lobe pulmonary nodule measures 3 mm on image 19/301.  Bibasilar scarring with bronchiolectasis. No pleural effusion. No pneumothorax.  Musculoskeletal: No aggressive lytic or blastic lesion of bone. Multilevel degenerative changes spine.  CT ABDOMEN PELVIS FINDINGS  Hepatobiliary: Stable bilobar hepatic cysts. No new suspicious hepatic lesion. Gallbladder is unremarkable. No biliary ductal dilation.  Pancreas: No pancreatic ductal dilation or evidence of acute inflammation.  Spleen: No splenomegaly.  Adrenals/Urinary Tract: Bilateral adrenal glands appear normal. Bilateral renal sinus cysts and 17 mm right cortical renal cysts are considered benign requiring no independent imaging follow-up. No hydronephrosis. Kidneys demonstrate symmetric enhancement. Urinary bladder is minimally distended limiting evaluation.  Stomach/Bowel: Radiopaque enteric contrast material traverses the descending colon. Moderate hiatal hernia. No pathologic dilation of small or large bowel. Colonic diverticulosis without findings of acute diverticulitis.  Vascular/Lymphatic: Normal caliber abdominal aorta. Smooth IVC contours. The portal, splenic and superior mesenteric veins are patent. No pathologically enlarged abdominal or pelvic lymph nodes.  Reproductive: Uterus and bilateral adnexa are unremarkable.  Other: No significant abdominopelvic free fluid. Similar size of the fluid density lesion in the left groin measuring 9 mm  Musculoskeletal: No aggressive lytic or blastic lesion of  bone. Multilevel degenerative changes spine. Stable sclerosis in the bilateral femoral heads compatible bone islands. Multilevel degenerative changes spine. Partial bony ankylosis of the right SI joint.  IMPRESSION: 1. Stable examination without new or progressive findings in the chest, abdomen or pelvis. 2. Stable tiny pulmonary nodules, favored benign. 3. Moderate-sized hiatal hernia. 4. Colonic diverticulosis without findings of acute diverticulitis.  LABS:   ASSESSMENT & PLAN:  Assessment/Plan:  A 72 y.o. 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  In clinic today, I went over all of her CT scan images with her, for which she could see her disease remains under ideal control.  She will proceed with her 37th cycle of Enhertu this week. Clinically, she is doing very well.  I will see her back in 3 weeks before she heads into her 38th cycle of Enhertu therapy.  The patient understands all the plans discussed today and is in agreement with them.     Kirby Funk, MD

## 2023-06-11 ENCOUNTER — Encounter: Payer: Self-pay | Admitting: Oncology

## 2023-06-11 ENCOUNTER — Other Ambulatory Visit: Payer: Self-pay | Admitting: Oncology

## 2023-06-11 ENCOUNTER — Inpatient Hospital Stay: Payer: Self-pay | Attending: Oncology | Admitting: Oncology

## 2023-06-11 ENCOUNTER — Ambulatory Visit: Payer: Self-pay

## 2023-06-11 DIAGNOSIS — Z171 Estrogen receptor negative status [ER-]: Secondary | ICD-10-CM | POA: Insufficient documentation

## 2023-06-11 DIAGNOSIS — C50412 Malignant neoplasm of upper-outer quadrant of left female breast: Secondary | ICD-10-CM

## 2023-06-11 DIAGNOSIS — Z5112 Encounter for antineoplastic immunotherapy: Secondary | ICD-10-CM | POA: Insufficient documentation

## 2023-06-11 DIAGNOSIS — C50919 Malignant neoplasm of unspecified site of unspecified female breast: Secondary | ICD-10-CM | POA: Insufficient documentation

## 2023-06-11 DIAGNOSIS — C770 Secondary and unspecified malignant neoplasm of lymph nodes of head, face and neck: Secondary | ICD-10-CM | POA: Insufficient documentation

## 2023-06-11 DIAGNOSIS — C7931 Secondary malignant neoplasm of brain: Secondary | ICD-10-CM | POA: Insufficient documentation

## 2023-06-11 MED FILL — Dexamethasone Sodium Phosphate Inj 100 MG/10ML: INTRAMUSCULAR | Qty: 1 | Status: AC

## 2023-06-12 ENCOUNTER — Inpatient Hospital Stay: Payer: Self-pay

## 2023-06-12 VITALS — BP 113/71 | HR 72 | Temp 98.2°F | Resp 14 | Wt 149.0 lb

## 2023-06-12 DIAGNOSIS — C50412 Malignant neoplasm of upper-outer quadrant of left female breast: Secondary | ICD-10-CM

## 2023-06-12 MED ORDER — HEPARIN SOD (PORK) LOCK FLUSH 100 UNIT/ML IV SOLN
500.0000 [IU] | Freq: Once | INTRAVENOUS | Status: AC | PRN
  Administered 2023-06-12: 500 [IU]

## 2023-06-12 MED ORDER — ACETAMINOPHEN 325 MG PO TABS
650.0000 mg | ORAL_TABLET | Freq: Once | ORAL | Status: AC
  Administered 2023-06-12: 650 mg via ORAL
  Filled 2023-06-12: qty 2

## 2023-06-12 MED ORDER — PALONOSETRON HCL INJECTION 0.25 MG/5ML
0.2500 mg | Freq: Once | INTRAVENOUS | Status: AC
  Administered 2023-06-12: 0.25 mg via INTRAVENOUS
  Filled 2023-06-12: qty 5

## 2023-06-12 MED ORDER — FAM-TRASTUZUMAB DERUXTECAN-NXKI CHEMO 100 MG IV SOLR
3.6300 mg/kg | Freq: Once | INTRAVENOUS | Status: AC
  Administered 2023-06-12: 240 mg via INTRAVENOUS
  Filled 2023-06-12: qty 12

## 2023-06-12 MED ORDER — SODIUM CHLORIDE 0.9 % IV SOLN
10.0000 mg | Freq: Once | INTRAVENOUS | Status: AC
  Administered 2023-06-12: 10 mg via INTRAVENOUS
  Filled 2023-06-12: qty 10
  Filled 2023-06-12: qty 1

## 2023-06-12 MED ORDER — SODIUM CHLORIDE 0.9% FLUSH
10.0000 mL | INTRAVENOUS | Status: DC | PRN
  Administered 2023-06-12: 10 mL

## 2023-06-12 MED ORDER — DIPHENHYDRAMINE HCL 25 MG PO CAPS
50.0000 mg | ORAL_CAPSULE | Freq: Once | ORAL | Status: AC
  Administered 2023-06-12: 50 mg via ORAL
  Filled 2023-06-12: qty 2

## 2023-06-12 MED ORDER — DEXTROSE 5 % IV SOLN: Freq: Once | INTRAVENOUS | Status: AC

## 2023-06-12 NOTE — Patient Instructions (Signed)
Fam-Trastuzumab Deruxtecan Injection Qu es este medicamento? El FAM-TRASTUZUMAB DERUXTECAN trata algunos tipos de cncer. Acta bloqueando una protena que hace que las clulas cancerosas crezcan y se multipliquen. Esto ayuda a Neurosurgeon propagacin de las clulas cancerosas. Este medicamento puede ser utilizado para otros usos; si tiene alguna pregunta consulte con su proveedor de atencin mdica o con su farmacutico. MARCAS COMUNES: ENHERTU Qu le debo informar a mi profesional de la salud antes de tomar este medicamento? Necesitan saber si usted presenta alguno de los [employer] o situaciones: Enfermedad cardiaca Insuficiencia cardiaca Infeccin, especialmente infecciones virales, tales como varicela, fuegos labiales o herpes Enfermedad heptica Enfermedad pulmonar o respiratoria, tales como asma o EPOC Una reaccin alrgica o inusual al fam-trastuzumab deruxtecan, a otros medicamentos, alimentos, colorantes o conservantes Si est embarazada o buscando quedar embarazada Si est amamantando a un beb Cmo debo utilizar este medicamento? Este medicamento se inyecta en una vena. Su equipo de atencin lo [place] en un hospital o en un entorno clnico. Se le entregar una Gua del medicamento (MedGuide, su nombre en ingls) especial antes de cada tratamiento. Asegrese de leer esta informacin cada vez cuidadosamente. Hable con su equipo de atencin sobre el uso de este medicamento en nios. Puede requerir atencin especial. Sobredosis: Pngase en contacto inmediatamente con un centro toxicolgico o una sala de urgencia si usted cree que haya tomado demasiado medicamento.<br>ATENCIN: [employer] es solo para usted. No comparta este medicamento con nadie. Qu sucede si me olvido de una dosis? Es importante no olvidar ninguna dosis. Llame a su equipo de atencin si no puede asistir a una cita. Qu puede interactuar con este medicamento? No se anticipan  interacciones. Puede ser que esta lista no menciona todas las posibles interacciones. Informe a su profesional de [employer] de [employer] productos a base de hierbas, medicamentos de [place] o suplementos nutritivos que est tomando. Si usted fuma, consume bebidas alcohlicas o si utiliza drogas ilegales, indqueselo tambin a su profesional de [employer]. Algunas sustancias pueden interactuar con su medicamento. A qu debo estar atento al usar [employer]? Visite a su equipo de atencin para que revise su evolucin peridicamente. Si los sntomas no comienzan a mejorar o si empeoran, consulte con su equipo de atencin. Se supervisar su estado de salud atentamente mientras reciba este medicamento. No debe quedar embarazada mientras est usando este medicamento o por 7 meses despus de dejar de usarlo. Las mujeres deben informar a su equipo de atencin si estn buscando quedar embarazadas o si creen que podran estar embarazadas. Los hombres no deben Media planner a Careers information officer estn recibiendo [employer] y [place] 4 meses despus de dejar de usarlo. Existe la posibilidad de que ocurran efectos secundarios graves en un beb sin nacer. Para obtener ms informacin, hable con su equipo de atencin. No debe amamantar a un beb mientras est usando este medicamento o durante 7 meses despus de la ltima dosis. Este medicamento ha causado recuentos de esperma reducidos en algunos hombres. Esto puede hacer ms difcil que un hombre embarace a Nurse, mental health. Hable con su equipo de atencin si le preocupa su fertilidad. Este medicamento podra aumentar el riesgo de moretones o sangrado. Llame a su equipo de atencin si observa sangrados inusuales. Proceda con cuidado al cepillar sus dientes, usar hilo dental o Chemical engineer palillos para los dientes, ya que podra contraer una infeccin o Geophysicist/field seismologist con mayor facilidad. Si recibe algn tratamiento dental, informe a su dentista que est [employer].  Este medicamento puede resecarle los ojos y provocar visin borrosa. Si [place] lentes de contacto, puede sentir ciertas molestias. Las gotas lubricantes para los ojos pueden ser tiles. Si el problema no desaparece o es grave, consulte a su equipo de atencin. Este medicamento puede aumentar su riesgo de contraer una infeccin. Llame a su equipo de atencin si tiene fiebre, escalofros, dolor de garganta o cualquier otro sntoma de resfriado o gripe. No se trate usted mismo. Trate de no acercarse a personas que estn enfermas. Evite usar [employer] contienen aspirina, acetaminofeno, ibuprofeno, naproxeno o ketoprofeno, a menos que as lo indique su equipo de atencin. Estos medicamentos pueden ocultar la fiebre. Qu efectos secundarios puedo tener al [employer] este medicamento? Efectos secundarios que debe informar a su equipo de atencin tan pronto como sea posible: Reacciones alrgicas: erupcin cutnea, comezn/picazn, urticaria, hinchazn de la cara, los labios, la lengua o la garganta Tos seca, falta de aire o problemas para respirar Infeccin: fiebre, escalofros, tos, dolor de garganta, heridas que no sanan, dolor o problemas para Geographical information systems officer, sensacin general de molestia o Sports administrator cardiaca: falta de aire, hinchazn de los tobillos, los pies o las [place], aumento de peso repentino, debilidad o fatiga inusuales Sangrado o moretones inusuales Efectos secundarios que generalmente no requieren atencin mdica (debe informarlos a su equipo de atencin si persisten o si son molestos): Estreimiento Diarrea Cada del cabello Dolor muscular Nuseas Vmito Puede ser que esta lista no menciona todos los posibles efectos secundarios. Comunquese a su mdico por asesoramiento mdico [employer]. Usted puede informar los efectos secundarios a la FDA por telfono al 1-800-FDA-1088. Dnde debo guardar mi medicina? Este medicamento se administra en hospitales  o clnicas. No se guarda en su casa. <b>ATENCIN: Este folleto es un resumen. Puede ser que no cubra toda la posible informacin. Si usted tiene preguntas acerca de esta medicina, consulte con su mdico, su farmacutico o su profesional de Radiographer, therapeutic.</b>  2024 Elsevier/Gold Standard (2021-09-06 00:00:00)

## 2023-06-13 ENCOUNTER — Other Ambulatory Visit: Payer: Self-pay

## 2023-06-19 ENCOUNTER — Encounter: Payer: Self-pay | Admitting: Oncology

## 2023-06-21 ENCOUNTER — Encounter: Payer: Self-pay | Admitting: Oncology

## 2023-06-26 NOTE — Progress Notes (Signed)
Sent in a request for DOS 07/02/2023 and 07/04/2023 to [employer].

## 2023-06-27 ENCOUNTER — Encounter: Payer: Self-pay | Admitting: Oncology

## 2023-07-01 NOTE — Progress Notes (Unsigned)
[facility]  [address] [place],  [place]  [address] [phone]  Clinic Day:  07/02/2023  HISTORY OF PRESENT ILLNESS:  The patient is a 72 y.o. 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  She also had a subcarinal lymph node for which scans have consistently shown a positive response to therapy. She comes in today to be be evaluated before heading into her 38th cycle of Enhertu. The patient claims to have tolerated her 37th cycle of treatment fairly well.  As it pertains to her metastatic breast cancer, she denies having any new symptoms or findings which concern her for overt signs of disease progression.    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  The patient received 6 cycles of Taxotere/Herceptin/Perjeta before being switched to maintenance Perjeta/Herceptin, for which she took 73 cycles of treatment.  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  This all happened in the setting of a prolonged treatment break from TDM-1.  Since recurrence was found, the patient has been on Enhertu since May 2022.  VITALS:  Blood pressure 129/84, pulse 73, temperature 97.7 F (36.5 C), resp. rate 14, height 5\' 1"  (1.549 m), weight 148 lb 11.2 oz (67.4 kg), SpO2 98%.  Wt Readings from Last 3 Encounters:  07/02/23 148 lb 11.2 oz (67.4 kg)  06/12/23 149 lb 0.6 oz (67.6 kg)  06/11/23 148 lb 1.6 oz (67.2 kg)    Body mass index is 28.1 kg/m.  Performance status (ECOG): 1 - Symptomatic but completely ambulatory  PHYSICAL EXAM:  Physical  Exam Constitutional:      General: She is not in acute distress.    Appearance: Normal appearance. She is normal weight.  HENT:     Head: Normocephalic and atraumatic.  Eyes:     General: No scleral icterus.    Extraocular Movements: Extraocular movements intact.     Conjunctiva/sclera: Conjunctivae normal.     Pupils: Pupils are equal, round, and reactive to light.  Cardiovascular:     Rate and Rhythm: Normal rate and regular rhythm.     Pulses: Normal pulses.     Heart sounds: Normal heart sounds. No murmur heard.    No friction rub. No gallop.  Pulmonary:     Effort: Pulmonary effort is normal. No respiratory distress.     Breath sounds: Normal breath sounds.  Abdominal:     General: Bowel sounds are normal. There is no distension.     Palpations: Abdomen is soft. There is no hepatomegaly, splenomegaly or mass.     Tenderness: There is no abdominal tenderness.  Musculoskeletal:        General: Normal range of motion.     Cervical back: Normal range of motion and neck supple.     Right lower leg: No edema.     Left lower leg: No edema.  Lymphadenopathy:     Cervical: No cervical adenopathy.  Skin:    General: Skin is warm and dry.  Neurological:     General: No focal deficit present.     Mental Status: She is alert  and oriented to person, place, and time. Mental status is at baseline.  Psychiatric:        Mood and Affect: Mood normal.        Behavior: Behavior normal.        Thought Content: Thought content normal.        Judgment: Judgment normal.    LABS:   ASSESSMENT & PLAN:  Assessment/Plan:  A 72 y.o. 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  She will proceed with her 38th cycle of Enhertu this week. Clinically, she is doing very well.  I will see her back in 3 weeks before she heads into her 39th cycle of Enhertu therapy.  The patient understands all the plans discussed today and is in agreement with them.    Bryanna Yim Kirby Funk, MD

## 2023-07-02 ENCOUNTER — Inpatient Hospital Stay (HOSPITAL_BASED_OUTPATIENT_CLINIC_OR_DEPARTMENT_OTHER): Payer: Self-pay | Admitting: Oncology

## 2023-07-02 ENCOUNTER — Inpatient Hospital Stay: Payer: Self-pay | Attending: Oncology

## 2023-07-02 DIAGNOSIS — Z171 Estrogen receptor negative status [ER-]: Secondary | ICD-10-CM

## 2023-07-02 DIAGNOSIS — C7931 Secondary malignant neoplasm of brain: Secondary | ICD-10-CM | POA: Insufficient documentation

## 2023-07-02 DIAGNOSIS — C50919 Malignant neoplasm of unspecified site of unspecified female breast: Secondary | ICD-10-CM | POA: Insufficient documentation

## 2023-07-02 DIAGNOSIS — N390 Urinary tract infection, site not specified: Secondary | ICD-10-CM

## 2023-07-02 DIAGNOSIS — C50412 Malignant neoplasm of upper-outer quadrant of left female breast: Secondary | ICD-10-CM

## 2023-07-02 DIAGNOSIS — Z170 Estrogen receptor positive status [ER+]: Secondary | ICD-10-CM | POA: Insufficient documentation

## 2023-07-02 DIAGNOSIS — C770 Secondary and unspecified malignant neoplasm of lymph nodes of head, face and neck: Secondary | ICD-10-CM | POA: Insufficient documentation

## 2023-07-02 LAB — CMP (CANCER CENTER ONLY)
ALT: 37 U/L (ref 0–44)
AST: 43 U/L — ABNORMAL HIGH (ref 15–41)
Albumin: 3.5 g/dL (ref 3.5–5.0)
Alkaline Phosphatase: 130 U/L — ABNORMAL HIGH (ref 38–126)
Anion gap: 9 (ref 5–15)
BUN: 15 mg/dL (ref 8–23)
CO2: 22 mmol/L (ref 22–32)
Calcium: 9.6 mg/dL (ref 8.9–10.3)
Chloride: 108 mmol/L (ref 98–111)
Creatinine: 0.73 mg/dL (ref 0.44–1.00)
GFR, Estimated: >60 mL/min (ref >60)
Glucose, Bld: 86 mg/dL (ref 70–99)
Potassium: 3.7 mmol/L (ref 3.5–5.1)
Sodium: 139 mmol/L (ref 135–145)
Total Bilirubin: 0.5 mg/dL (ref 0.3–1.2)
Total Protein: 8 g/dL (ref 6.5–8.1)

## 2023-07-02 LAB — CBC AND DIFFERENTIAL
HCT: 37 (ref 36–46)
Hemoglobin: 12.7 (ref 12.0–16.0)
Neutrophils Absolute: 2.38
Platelets: 140 10*3/uL — AB (ref 150–400)
WBC: 3.6

## 2023-07-02 LAB — CBC: RBC: 3.9 (ref 3.87–5.11)

## 2023-07-03 ENCOUNTER — Other Ambulatory Visit: Payer: Self-pay

## 2023-07-03 MED FILL — Dexamethasone Sodium Phosphate Inj 100 MG/10ML: INTRAMUSCULAR | Qty: 1 | Status: AC

## 2023-07-03 NOTE — Progress Notes (Signed)
Sent in request for DOS 07/22/2023 and 07/24/2023 to [employer]

## 2023-07-04 ENCOUNTER — Inpatient Hospital Stay: Payer: Self-pay

## 2023-07-04 VITALS — BP 119/79 | HR 66 | Temp 98.1°F | Resp 16 | Wt 148.1 lb

## 2023-07-04 DIAGNOSIS — C50412 Malignant neoplasm of upper-outer quadrant of left female breast: Secondary | ICD-10-CM

## 2023-07-04 MED ORDER — SODIUM CHLORIDE 0.9 % IV SOLN
10.0000 mg | Freq: Once | INTRAVENOUS | Status: AC
  Administered 2023-07-04: 10 mg via INTRAVENOUS
  Filled 2023-07-04: qty 10

## 2023-07-04 MED ORDER — DEXTROSE 5 % IV SOLN: Freq: Once | INTRAVENOUS | Status: AC

## 2023-07-04 MED ORDER — SODIUM CHLORIDE 0.9% FLUSH
10.0000 mL | INTRAVENOUS | Status: DC | PRN
  Administered 2023-07-04: 10 mL

## 2023-07-04 MED ORDER — PALONOSETRON HCL INJECTION 0.25 MG/5ML
0.2500 mg | Freq: Once | INTRAVENOUS | Status: AC
  Administered 2023-07-04: 0.25 mg via INTRAVENOUS
  Filled 2023-07-04: qty 5

## 2023-07-04 MED ORDER — FAM-TRASTUZUMAB DERUXTECAN-NXKI CHEMO 100 MG IV SOLR
3.6300 mg/kg | Freq: Once | INTRAVENOUS | Status: AC
  Administered 2023-07-04: 240 mg via INTRAVENOUS
  Filled 2023-07-04: qty 12

## 2023-07-04 MED ORDER — DIPHENHYDRAMINE HCL 25 MG PO CAPS
50.0000 mg | ORAL_CAPSULE | Freq: Once | ORAL | Status: AC
  Administered 2023-07-04: 50 mg via ORAL
  Filled 2023-07-04: qty 2

## 2023-07-04 MED ORDER — ACETAMINOPHEN 325 MG PO TABS
650.0000 mg | ORAL_TABLET | Freq: Once | ORAL | Status: AC
  Administered 2023-07-04: 650 mg via ORAL
  Filled 2023-07-04: qty 2

## 2023-07-04 MED ORDER — HEPARIN SOD (PORK) LOCK FLUSH 100 UNIT/ML IV SOLN
500.0000 [IU] | Freq: Once | INTRAVENOUS | Status: AC | PRN
  Administered 2023-07-04: 500 [IU]

## 2023-07-04 NOTE — Patient Instructions (Signed)
 Fam-Trastuzumab Deruxtecan Injection Qu es este medicamento? El FAM-TRASTUZUMAB DERUXTECAN trata algunos tipos de cncer. Acta bloqueando una protena que hace que las clulas cancerosas crezcan y se multipliquen. Esto ayuda a Neurosurgeon propagacin de las clulas cancerosas. Este medicamento puede ser utilizado para otros usos; si tiene alguna pregunta consulte con su proveedor de atencin mdica o con su farmacutico. MARCAS COMUNES: ENHERTU Qu le debo informar a mi profesional de la salud antes de tomar este medicamento? Necesitan saber si usted presenta alguno de los [employer] o situaciones: Enfermedad cardiaca Insuficiencia cardiaca Infeccin, especialmente infecciones virales, tales como varicela, fuegos labiales o herpes Enfermedad heptica Enfermedad pulmonar o respiratoria, tales como asma o EPOC Una reaccin alrgica o inusual al fam-trastuzumab deruxtecan, a otros medicamentos, alimentos, colorantes o conservantes Si est embarazada o buscando quedar embarazada Si est amamantando a un beb Cmo debo utilizar este medicamento? Este medicamento se inyecta en una vena. Su equipo de atencin lo [place] en un hospital o en un entorno clnico. Se le entregar una Gua del medicamento (MedGuide, su nombre en ingls) especial antes de cada tratamiento. Asegrese de leer esta informacin cada vez cuidadosamente. Hable con su equipo de atencin sobre el uso de este medicamento en nios. Puede requerir atencin especial. Sobredosis: Pngase en contacto inmediatamente con un centro toxicolgico o una sala de urgencia si usted cree que haya tomado demasiado medicamento.<br>ATENCIN: [employer] es solo para usted. No comparta este medicamento con nadie. Qu sucede si me olvido de una dosis? Es importante no olvidar ninguna dosis. Llame a su equipo de atencin si no puede asistir a una cita. Qu puede interactuar con este medicamento? No se anticipan  interacciones. Puede ser que esta lista no menciona todas las posibles interacciones. Informe a su profesional de [employer] de [employer] productos a base de hierbas, medicamentos de [place] o suplementos nutritivos que est tomando. Si usted fuma, consume bebidas alcohlicas o si utiliza drogas ilegales, indqueselo tambin a su profesional de [employer]. Algunas sustancias pueden interactuar con su medicamento. A qu debo estar atento al usar [employer]? Visite a su equipo de atencin para que revise su evolucin peridicamente. Si los sntomas no comienzan a mejorar o si empeoran, consulte con su equipo de atencin. Se supervisar su estado de salud atentamente mientras reciba este medicamento. No debe quedar embarazada mientras est usando este medicamento o por 7 meses despus de dejar de usarlo. Las mujeres deben informar a su equipo de atencin si estn buscando quedar embarazadas o si creen que podran estar embarazadas. Los hombres no deben Media planner a Careers information officer estn recibiendo [employer] y [place] 4 meses despus de dejar de usarlo. Existe la posibilidad de que ocurran efectos secundarios graves en un beb sin nacer. Para obtener ms informacin, hable con su equipo de atencin. No debe amamantar a un beb mientras est usando este medicamento o durante 7 meses despus de la ltima dosis. Este medicamento ha causado recuentos de esperma reducidos en algunos hombres. Esto puede hacer ms difcil que un hombre embarace a Nurse, mental health. Hable con su equipo de atencin si le preocupa su fertilidad. Este medicamento podra aumentar el riesgo de moretones o sangrado. Llame a su equipo de atencin si observa sangrados inusuales. Proceda con cuidado al cepillar sus dientes, usar hilo dental o Chemical engineer palillos para los dientes, ya que podra contraer una infeccin o Geophysicist/field seismologist con mayor facilidad. Si recibe algn tratamiento dental, informe a su dentista que est [employer].  Este medicamento puede resecarle los ojos y provocar visin borrosa. Si [place] lentes de contacto, puede sentir ciertas molestias. Las gotas lubricantes para los ojos pueden ser tiles. Si el problema no desaparece o es grave, consulte a su equipo de atencin. Este medicamento puede aumentar su riesgo de contraer una infeccin. Llame a su equipo de atencin si tiene fiebre, escalofros, dolor de garganta o cualquier otro sntoma de resfriado o gripe. No se trate usted mismo. Trate de no acercarse a personas que estn enfermas. Evite usar [employer] contienen aspirina, acetaminofeno, ibuprofeno, naproxeno o ketoprofeno, a menos que as lo indique su equipo de atencin. Estos medicamentos pueden ocultar la fiebre. Qu efectos secundarios puedo tener al [employer] este medicamento? Efectos secundarios que debe informar a su equipo de atencin tan pronto como sea posible: Reacciones alrgicas: erupcin cutnea, comezn/picazn, urticaria, hinchazn de la cara, los labios, la lengua o la garganta Tos seca, falta de aire o problemas para respirar Infeccin: fiebre, escalofros, tos, dolor de garganta, heridas que no sanan, dolor o problemas para Geographical information systems officer, sensacin general de molestia o Sports administrator cardiaca: falta de aire, hinchazn de los tobillos, los pies o las [place], aumento de peso repentino, debilidad o fatiga inusuales Sangrado o moretones inusuales Efectos secundarios que generalmente no requieren atencin mdica (debe informarlos a su equipo de atencin si persisten o si son molestos): Estreimiento Diarrea Cada del cabello Dolor muscular Nuseas Vmito Puede ser que esta lista no menciona todos los posibles efectos secundarios. Comunquese a su mdico por asesoramiento mdico [employer]. Usted puede informar los efectos secundarios a la FDA por telfono al 1-800-FDA-1088. Dnde debo guardar mi medicina? Este medicamento se administra en hospitales  o clnicas. No se guarda en su casa. <b>ATENCIN: Este folleto es un resumen. Puede ser que no cubra toda la posible informacin. Si usted tiene preguntas acerca de esta medicina, consulte con su mdico, su farmacutico o su profesional de Radiographer, therapeutic.</b>  2024 Elsevier/Gold Standard (2021-09-06 00:00:00)

## 2023-07-21 NOTE — Progress Notes (Signed)
[facility]  [address] [place],  [place]  [address] [phone]  Clinic Day:  07/22/2023  HISTORY OF PRESENT ILLNESS:  The patient is a 72 y.o. 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  She also had a subcarinal lymph node for which scans have consistently shown a positive response to therapy. She comes in today to be be evaluated before heading into her 39th cycle of Enhertu. The patient claims to have tolerated her 38th cycle of treatment fairly well. However, she has been coughing more.    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  The patient received 6 cycles of Taxotere/Herceptin/Perjeta before being switched to maintenance Perjeta/Herceptin, for which she took 73 cycles of treatment.  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  This all happened in the setting of a prolonged treatment break from TDM-1.  Since recurrence was found, the patient has been on Enhertu since May 2022.  VITALS:  Blood pressure 114/67, pulse 85, temperature 99.3 F (37.4 C), resp. rate 16, height 5\' 1"  (1.549 m), weight 147 lb 8 oz (66.9 kg), SpO2 93%.  Wt Readings from Last 3 Encounters:  08/07/23 148 lb 1.9 oz (67.2 kg)  07/22/23 147 lb 8 oz (66.9 kg)  07/04/23 148 lb 1.3 oz (67.2 kg)    Body mass index is 27.87 kg/m.  Performance status (ECOG): 1 - Symptomatic but completely ambulatory  PHYSICAL EXAM:  Physical Exam Constitutional:      General: She is not in acute distress.    Appearance: Normal appearance. She is normal  weight.  HENT:     Head: Normocephalic and atraumatic.  Eyes:     General: No scleral icterus.    Extraocular Movements: Extraocular movements intact.     Conjunctiva/sclera: Conjunctivae normal.     Pupils: Pupils are equal, round, and reactive to light.  Cardiovascular:     Rate and Rhythm: Normal rate and regular rhythm.     Pulses: Normal pulses.     Heart sounds: Normal heart sounds. No murmur heard.    No friction rub. No gallop.  Pulmonary:     Effort: Pulmonary effort is normal. No respiratory distress.     Breath sounds: Normal breath sounds.  Abdominal:     General: Bowel sounds are normal. There is no distension.     Palpations: Abdomen is soft. There is no hepatomegaly, splenomegaly or mass.     Tenderness: There is no abdominal tenderness.  Musculoskeletal:        General: Normal range of motion.     Cervical back: Normal range of motion and neck supple.     Right lower leg: No edema.     Left lower leg: No edema.  Lymphadenopathy:     Cervical: No cervical adenopathy.  Skin:    General: Skin is warm and dry.  Neurological:     General: No focal deficit present.     Mental Status: She is alert and oriented to person, place, and time. Mental status is at baseline.  Psychiatric:  Mood and Affect: Mood normal.        Behavior: Behavior normal.        Thought Content: Thought content normal.        Judgment: Judgment normal.    LABS:  Latest Reference Range & Units 07/22/23 13:23  Sodium 135 - 145 mmol/L 135  Potassium 3.5 - 5.1 mmol/L 3.8  Chloride 98 - 111 mmol/L 104  CO2 22 - 32 mmol/L 23  Glucose 70 - 99 mg/dL 99  BUN 8 - 23 mg/dL 8  Creatinine 2.72 - 5.36 mg/dL 6.44  Calcium 8.9 - 03.4 mg/dL 8.7 (L)  Anion gap 5 - 15  8  Alkaline Phosphatase 38 - 126 U/L 128 (H)  Albumin 3.5 - 5.0 g/dL 3.1 (L)  AST 15 - 41 U/L 46 (H)  ALT 0 - 44 U/L 36  Total Protein 6.5 - 8.1 g/dL 7.3  Total Bilirubin 0.3 - 1.2 mg/dL 0.3  GFR, Est Non African American >60  mL/min >60  (L): Data is abnormally low (H): Data is abnormally high  ASSESSMENT & PLAN:  Assessment/Plan:  A 72 y.o. 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  With respect to her coughing, the main concern I do have is the possibility of Enhertu causing interstitial pneumonitis.  I will place her on a prednisone taper to see if this improves her coughing over time.  There was nothing per her lung exam today which suggested any acute process was present.  However, I will delay her 39th cycle of treatment for at least a week to ensure her coughing gets better before she receives it.  Otherwise, I will see her back in 3 weeks later before she heads into her 40th cycle of Enhertu therapy.  The patient understands all the plans discussed today and is in agreement with them.    Indiyah Paone Kirby Funk, MD

## 2023-07-22 ENCOUNTER — Encounter: Payer: Self-pay | Admitting: Oncology

## 2023-07-22 ENCOUNTER — Inpatient Hospital Stay: Payer: Self-pay

## 2023-07-22 ENCOUNTER — Other Ambulatory Visit: Payer: Self-pay | Admitting: Oncology

## 2023-07-22 ENCOUNTER — Inpatient Hospital Stay (HOSPITAL_BASED_OUTPATIENT_CLINIC_OR_DEPARTMENT_OTHER): Payer: Self-pay | Admitting: Oncology

## 2023-07-22 DIAGNOSIS — C50412 Malignant neoplasm of upper-outer quadrant of left female breast: Secondary | ICD-10-CM

## 2023-07-22 DIAGNOSIS — Z171 Estrogen receptor negative status [ER-]: Secondary | ICD-10-CM

## 2023-07-22 LAB — CMP (CANCER CENTER ONLY)
ALT: 36 U/L (ref 0–44)
AST: 46 U/L — ABNORMAL HIGH (ref 15–41)
Albumin: 3.1 g/dL — ABNORMAL LOW (ref 3.5–5.0)
Alkaline Phosphatase: 128 U/L — ABNORMAL HIGH (ref 38–126)
Anion gap: 8 (ref 5–15)
BUN: 8 mg/dL (ref 8–23)
CO2: 23 mmol/L (ref 22–32)
Calcium: 8.7 mg/dL — ABNORMAL LOW (ref 8.9–10.3)
Chloride: 104 mmol/L (ref 98–111)
Creatinine: 0.74 mg/dL (ref 0.44–1.00)
GFR, Estimated: >60 mL/min (ref >60)
Glucose, Bld: 99 mg/dL (ref 70–99)
Potassium: 3.8 mmol/L (ref 3.5–5.1)
Sodium: 135 mmol/L (ref 135–145)
Total Bilirubin: 0.3 mg/dL (ref 0.3–1.2)
Total Protein: 7.3 g/dL (ref 6.5–8.1)

## 2023-07-22 LAB — CBC AND DIFFERENTIAL
HCT: 37 (ref 36–46)
Hemoglobin: 12.5 (ref 12.0–16.0)
Neutrophils Absolute: 1.8
Platelets: 127 10*3/uL — AB (ref 150–400)
WBC: 3

## 2023-07-22 LAB — CBC: RBC: 3.83 — AB (ref 3.87–5.11)

## 2023-07-22 MED ORDER — PREDNISONE 10 MG PO TABS: ORAL_TABLET | ORAL | 0 refills | Status: DC

## 2023-07-22 MED ORDER — AZITHROMYCIN 500 MG PO TABS: 500.0000 mg | ORAL_TABLET | Freq: Every day | ORAL | 0 refills | Status: DC

## 2023-07-23 ENCOUNTER — Encounter: Payer: Self-pay | Admitting: Oncology

## 2023-07-23 ENCOUNTER — Other Ambulatory Visit: Payer: Self-pay

## 2023-07-24 ENCOUNTER — Inpatient Hospital Stay: Payer: Self-pay

## 2023-07-25 ENCOUNTER — Other Ambulatory Visit: Payer: Self-pay

## 2023-07-29 ENCOUNTER — Other Ambulatory Visit: Payer: Self-pay

## 2023-07-31 ENCOUNTER — Ambulatory Visit: Payer: Self-pay

## 2023-08-01 ENCOUNTER — Encounter: Payer: Self-pay | Admitting: Oncology

## 2023-08-05 ENCOUNTER — Ambulatory Visit: Payer: Self-pay | Admitting: Oncology

## 2023-08-05 ENCOUNTER — Inpatient Hospital Stay: Payer: Self-pay | Attending: Oncology

## 2023-08-05 DIAGNOSIS — Z5112 Encounter for antineoplastic immunotherapy: Secondary | ICD-10-CM | POA: Insufficient documentation

## 2023-08-05 DIAGNOSIS — C7931 Secondary malignant neoplasm of brain: Secondary | ICD-10-CM | POA: Insufficient documentation

## 2023-08-05 DIAGNOSIS — C50919 Malignant neoplasm of unspecified site of unspecified female breast: Secondary | ICD-10-CM | POA: Insufficient documentation

## 2023-08-05 DIAGNOSIS — C7801 Secondary malignant neoplasm of right lung: Secondary | ICD-10-CM | POA: Insufficient documentation

## 2023-08-05 DIAGNOSIS — C7802 Secondary malignant neoplasm of left lung: Secondary | ICD-10-CM | POA: Insufficient documentation

## 2023-08-05 DIAGNOSIS — C770 Secondary and unspecified malignant neoplasm of lymph nodes of head, face and neck: Secondary | ICD-10-CM | POA: Insufficient documentation

## 2023-08-05 DIAGNOSIS — Z171 Estrogen receptor negative status [ER-]: Secondary | ICD-10-CM | POA: Insufficient documentation

## 2023-08-05 LAB — CMP (CANCER CENTER ONLY)
ALT: 39 U/L (ref 0–44)
AST: 45 U/L — ABNORMAL HIGH (ref 15–41)
Albumin: 3.6 g/dL (ref 3.5–5.0)
Alkaline Phosphatase: 126 U/L (ref 38–126)
Anion gap: 7 (ref 5–15)
BUN: 12 mg/dL (ref 8–23)
CO2: 24 mmol/L (ref 22–32)
Calcium: 9.2 mg/dL (ref 8.9–10.3)
Chloride: 107 mmol/L (ref 98–111)
Creatinine: 0.68 mg/dL (ref 0.44–1.00)
GFR, Estimated: >60 mL/min (ref >60)
Glucose, Bld: 99 mg/dL (ref 70–99)
Potassium: 3.9 mmol/L (ref 3.5–5.1)
Sodium: 138 mmol/L (ref 135–145)
Total Bilirubin: 0.8 mg/dL (ref 0.3–1.2)
Total Protein: 7.4 g/dL (ref 6.5–8.1)

## 2023-08-05 LAB — CBC WITH DIFFERENTIAL (CANCER CENTER ONLY)
Abs Immature Granulocytes: 0.01 10*3/uL (ref 0.00–0.07)
Basophils Absolute: 0 10*3/uL (ref 0.0–0.1)
Basophils Relative: 0 %
Eosinophils Absolute: 0.1 10*3/uL (ref 0.0–0.5)
Eosinophils Relative: 1 %
HCT: 37.4 % (ref 36.0–46.0)
Hemoglobin: 12.3 g/dL (ref 12.0–15.0)
Immature Granulocytes: 0 %
Lymphocytes Relative: 22 %
Lymphs Abs: 0.8 10*3/uL (ref 0.7–4.0)
MCH: 31.8 pg (ref 26.0–34.0)
MCHC: 32.9 g/dL (ref 30.0–36.0)
MCV: 96.6 fL (ref 80.0–100.0)
Monocytes Absolute: 0.4 10*3/uL (ref 0.1–1.0)
Monocytes Relative: 11 %
Neutro Abs: 2.2 10*3/uL (ref 1.7–7.7)
Neutrophils Relative %: 66 %
Platelet Count: 146 10*3/uL — ABNORMAL LOW (ref 150–400)
RBC: 3.87 MIL/uL (ref 3.87–5.11)
RDW: 13.6 % (ref 11.5–15.5)
WBC Count: 3.5 10*3/uL — ABNORMAL LOW (ref 4.0–10.5)
nRBC: 0 % (ref 0.0–0.2)

## 2023-08-06 ENCOUNTER — Telehealth: Payer: Self-pay | Admitting: Oncology

## 2023-08-06 ENCOUNTER — Encounter: Payer: Self-pay | Admitting: Oncology

## 2023-08-06 MED FILL — Dexamethasone Sodium Phosphate Inj 100 MG/10ML: INTRAMUSCULAR | Qty: 1 | Status: AC

## 2023-08-06 NOTE — Telephone Encounter (Signed)
Patient has been scheduled and transportation was requested. Pt is aware of appt date and time.   ECHO needed Received: Today Phy, Georga Hacking, RPH  [facility]; [facility] Scheduling Please schedule pt for ECHO around 10/23.

## 2023-08-07 ENCOUNTER — Inpatient Hospital Stay: Payer: Self-pay

## 2023-08-07 VITALS — BP 122/65 | HR 84 | Temp 97.5°F | Ht 61.0 in | Wt 148.1 lb

## 2023-08-07 DIAGNOSIS — C50412 Malignant neoplasm of upper-outer quadrant of left female breast: Secondary | ICD-10-CM

## 2023-08-07 MED ORDER — SODIUM CHLORIDE 0.9 % IV SOLN
10.0000 mg | Freq: Once | INTRAVENOUS | Status: AC
  Administered 2023-08-07: 10 mg via INTRAVENOUS
  Filled 2023-08-07: qty 10

## 2023-08-07 MED ORDER — DEXTROSE 5 % IV SOLN: Freq: Once | INTRAVENOUS | Status: AC

## 2023-08-07 MED ORDER — DIPHENHYDRAMINE HCL 25 MG PO CAPS
50.0000 mg | ORAL_CAPSULE | Freq: Once | ORAL | Status: AC
  Administered 2023-08-07: 50 mg via ORAL
  Filled 2023-08-07: qty 2

## 2023-08-07 MED ORDER — ACETAMINOPHEN 325 MG PO TABS
650.0000 mg | ORAL_TABLET | Freq: Once | ORAL | Status: AC
  Administered 2023-08-07: 650 mg via ORAL
  Filled 2023-08-07: qty 2

## 2023-08-07 MED ORDER — FAM-TRASTUZUMAB DERUXTECAN-NXKI CHEMO 100 MG IV SOLR
3.6300 mg/kg | Freq: Once | INTRAVENOUS | Status: AC
  Administered 2023-08-07: 240 mg via INTRAVENOUS
  Filled 2023-08-07: qty 12

## 2023-08-07 MED ORDER — HEPARIN SOD (PORK) LOCK FLUSH 100 UNIT/ML IV SOLN
500.0000 [IU] | Freq: Once | INTRAVENOUS | Status: AC | PRN
  Administered 2023-08-07: 500 [IU]

## 2023-08-07 MED ORDER — SODIUM CHLORIDE 0.9% FLUSH
10.0000 mL | INTRAVENOUS | Status: DC | PRN
  Administered 2023-08-07: 10 mL

## 2023-08-07 MED ORDER — PALONOSETRON HCL INJECTION 0.25 MG/5ML
0.2500 mg | Freq: Once | INTRAVENOUS | Status: AC
  Administered 2023-08-07: 0.25 mg via INTRAVENOUS
  Filled 2023-08-07: qty 5

## 2023-08-09 ENCOUNTER — Other Ambulatory Visit: Payer: Self-pay | Admitting: Oncology

## 2023-08-09 ENCOUNTER — Other Ambulatory Visit: Payer: Self-pay

## 2023-08-09 ENCOUNTER — Telehealth: Payer: Self-pay

## 2023-08-09 MED ORDER — PREDNISONE 10 MG PO TABS: ORAL_TABLET | ORAL | 0 refills | Status: DC

## 2023-08-09 NOTE — Telephone Encounter (Signed)
She was seen @ Urgent Care Monday - gave her albuterol inhaler and tessalon pearles. Afebrile. Req recommendations on what else they could try? Above message sent to [facility] and Dr Melvyn Neth.  Darl Pikes Phy,RPH: Enhertu Cough Incidence: 10% to 20% Dyspnea Interstitial lung disease Pleural effusion Pneumonia Respiratory tract infection Upper respiratory infection

## 2023-08-12 ENCOUNTER — Encounter: Payer: Self-pay | Admitting: Oncology

## 2023-08-12 ENCOUNTER — Other Ambulatory Visit: Payer: Self-pay

## 2023-08-12 ENCOUNTER — Other Ambulatory Visit: Payer: Self-pay | Admitting: Oncology

## 2023-08-13 ENCOUNTER — Encounter: Payer: Self-pay | Admitting: Oncology

## 2023-08-15 ENCOUNTER — Encounter: Payer: Self-pay | Admitting: Oncology

## 2023-08-17 ENCOUNTER — Other Ambulatory Visit: Payer: Self-pay

## 2023-08-19 ENCOUNTER — Other Ambulatory Visit: Payer: Self-pay

## 2023-08-19 ENCOUNTER — Ambulatory Visit: Payer: Self-pay | Admitting: Oncology

## 2023-08-20 ENCOUNTER — Encounter: Payer: Self-pay | Admitting: Oncology

## 2023-08-22 ENCOUNTER — Ambulatory Visit: Payer: Self-pay

## 2023-08-25 NOTE — Progress Notes (Signed)
[name]  [address] [place],  [place]  [address] [phone]  Clinic Day:  08/26/2023  HISTORY OF PRESENT ILLNESS:  The patient is a 72 y.o. 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  She also had a subcarinal lymph node for which scans have consistently shown a positive response to therapy. She comes in today to be be evaluated before heading into her 40th cycle of Enhertu. The patient claims to have tolerated her 39th cycle of treatment fairly well. However, she has been coughing more lately.  There is only minimal, clean sputum being produced with each cough.  However, she does not believe she needs an antibiotic.    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  The patient received 6 cycles of Taxotere/Herceptin/Perjeta before being switched to maintenance Perjeta/Herceptin, for which she took 73 cycles of treatment.  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  This all happened in the setting of a prolonged treatment break from TDM-1.  Since recurrence was found, the patient has been on Enhertu since May 2022.  VITALS:  Blood pressure (!) 124/59, pulse 83, temperature 98.3 F (36.8 C), resp. rate 16, height 5\' 1"  (1.549 m), weight 146 lb 8 oz (66.5 kg), SpO2 95%.  Wt Readings from Last 3 Encounters:  08/28/23 146 lb (66.2 kg)  08/26/23 146 lb 8 oz (66.5 kg)  08/07/23 148 lb 1.9 oz (67.2 kg)    Body mass index is 27.68 kg/m.  Performance status (ECOG): 1 - Symptomatic but completely ambulatory  PHYSICAL EXAM:   Physical Exam Constitutional:      General: She is not in acute distress.    Appearance: Normal appearance. She is normal weight.  HENT:     Head: Normocephalic and atraumatic.  Eyes:     General: No scleral icterus.    Extraocular Movements: Extraocular movements intact.     Conjunctiva/sclera: Conjunctivae normal.     Pupils: Pupils are equal, round, and reactive to light.  Cardiovascular:     Rate and Rhythm: Normal rate and regular rhythm.     Pulses: Normal pulses.     Heart sounds: Normal heart sounds. No murmur heard.    No friction rub. No gallop.  Pulmonary:     Effort: Pulmonary effort is normal. No respiratory distress.     Breath sounds: Normal breath sounds.  Abdominal:     General: Bowel sounds are normal. There is no distension.     Palpations: Abdomen is soft. There is no hepatomegaly, splenomegaly or mass.     Tenderness: There is no abdominal tenderness.  Musculoskeletal:        General: Normal range of motion.     Cervical back: Normal range of motion and neck supple.     Right lower leg: No edema.     Left lower leg: No edema.  Lymphadenopathy:     Cervical: No cervical adenopathy.  Skin:    General: Skin is warm and dry.  Neurological:     General: No focal deficit present.     Mental Status: She  is alert and oriented to person, place, and time. Mental status is at baseline.  Psychiatric:        Mood and Affect: Mood normal.        Behavior: Behavior normal.        Thought Content: Thought content normal.        Judgment: Judgment normal.    LABS:  Latest Reference Range & Units 08/26/23 00:00 08/26/23 13:49  Sodium 135 - 145 mmol/L  138  Potassium 3.5 - 5.1 mmol/L  3.9  Chloride 98 - 111 mmol/L  110  CO2 22 - 32 mmol/L  20 (L)  Glucose 70 - 99 mg/dL  161 (H)  BUN 8 - 23 mg/dL  11  Creatinine 0.96 - 1.00 mg/dL  0.45  Calcium 8.9 - 40.9 mg/dL  9.4  Anion gap 5 - 15   8  Alkaline Phosphatase 38 - 126 U/L  106  Albumin 3.5 - 5.0 g/dL  3.5   AST 15 - 41 U/L  43 (H)  ALT 0 - 44 U/L  38  Total Protein 6.5 - 8.1 g/dL  7.5  Total Bilirubin 0.3 - 1.2 mg/dL  0.9  GFR, Est Non African American >60 mL/min  >60  WBC  3.8 (E)   RBC 3.87 - 5.11  3.91 (E)   Hemoglobin 12.0 - 16.0  12.7 (E)   HCT 36 - 46  38 (E)   Platelets 150 - 400 K/uL 113 ! (E)   NEUT#  2.66 (E)   (L): Data is abnormally low (H): Data is abnormally high !: Data is abnormal (E): External lab result  ASSESSMENT & PLAN:  Assessment/Plan:  A 72 y.o. 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  She will proceed with her 40th cycle of Enhertu therapy this week.  I will see her back in 3 weeks before a potential 41st cycle of Enhertu.  CT scans will be done a day before her next visit to ascertain her new disease baseline after 40 cycles of Enhertu.  The patient understands all the plans discussed today and is in agreement with them.    Teresa Delancey Kirby Funk, MD

## 2023-08-26 ENCOUNTER — Inpatient Hospital Stay (HOSPITAL_BASED_OUTPATIENT_CLINIC_OR_DEPARTMENT_OTHER): Payer: Self-pay | Admitting: Oncology

## 2023-08-26 ENCOUNTER — Inpatient Hospital Stay: Payer: Self-pay

## 2023-08-26 DIAGNOSIS — Z171 Estrogen receptor negative status [ER-]: Secondary | ICD-10-CM

## 2023-08-26 DIAGNOSIS — C50412 Malignant neoplasm of upper-outer quadrant of left female breast: Secondary | ICD-10-CM

## 2023-08-26 LAB — CMP (CANCER CENTER ONLY)
ALT: 38 U/L (ref 0–44)
AST: 43 U/L — ABNORMAL HIGH (ref 15–41)
Albumin: 3.5 g/dL (ref 3.5–5.0)
Alkaline Phosphatase: 106 U/L (ref 38–126)
Anion gap: 8 (ref 5–15)
BUN: 11 mg/dL (ref 8–23)
CO2: 20 mmol/L — ABNORMAL LOW (ref 22–32)
Calcium: 9.4 mg/dL (ref 8.9–10.3)
Chloride: 110 mmol/L (ref 98–111)
Creatinine: 0.68 mg/dL (ref 0.44–1.00)
GFR, Estimated: >60 mL/min (ref >60)
Glucose, Bld: 112 mg/dL — ABNORMAL HIGH (ref 70–99)
Potassium: 3.9 mmol/L (ref 3.5–5.1)
Sodium: 138 mmol/L (ref 135–145)
Total Bilirubin: 0.9 mg/dL (ref 0.3–1.2)
Total Protein: 7.5 g/dL (ref 6.5–8.1)

## 2023-08-26 LAB — CBC AND DIFFERENTIAL
HCT: 38 (ref 36–46)
Hemoglobin: 12.7 (ref 12.0–16.0)
Neutrophils Absolute: 2.66
Platelets: 113 10*3/uL — AB (ref 150–400)
WBC: 3.8

## 2023-08-26 LAB — CBC: RBC: 3.91 (ref 3.87–5.11)

## 2023-08-27 ENCOUNTER — Other Ambulatory Visit: Payer: Self-pay

## 2023-08-27 ENCOUNTER — Encounter: Payer: Self-pay | Admitting: Oncology

## 2023-08-28 ENCOUNTER — Inpatient Hospital Stay: Payer: Self-pay

## 2023-08-28 VITALS — BP 125/71 | HR 77 | Temp 98.0°F | Resp 18 | Ht 61.0 in | Wt 146.0 lb

## 2023-08-28 DIAGNOSIS — C50412 Malignant neoplasm of upper-outer quadrant of left female breast: Secondary | ICD-10-CM

## 2023-08-28 MED ORDER — DIPHENHYDRAMINE HCL 25 MG PO CAPS
50.0000 mg | ORAL_CAPSULE | Freq: Once | ORAL | Status: AC
  Administered 2023-08-28: 50 mg via ORAL
  Filled 2023-08-28: qty 2

## 2023-08-28 MED ORDER — ACETAMINOPHEN 325 MG PO TABS
650.0000 mg | ORAL_TABLET | Freq: Once | ORAL | Status: AC
  Administered 2023-08-28: 650 mg via ORAL
  Filled 2023-08-28: qty 2

## 2023-08-28 MED ORDER — HEPARIN SOD (PORK) LOCK FLUSH 100 UNIT/ML IV SOLN
500.0000 [IU] | Freq: Once | INTRAVENOUS | Status: AC | PRN
  Administered 2023-08-28: 500 [IU]

## 2023-08-28 MED ORDER — PALONOSETRON HCL INJECTION 0.25 MG/5ML
0.2500 mg | Freq: Once | INTRAVENOUS | Status: AC
Start: 2023-08-28 — End: 2023-08-28
  Administered 2023-08-28: 0.25 mg via INTRAVENOUS
  Filled 2023-08-28: qty 5

## 2023-08-28 MED ORDER — FAM-TRASTUZUMAB DERUXTECAN-NXKI CHEMO 100 MG IV SOLR
3.6300 mg/kg | Freq: Once | INTRAVENOUS | Status: AC
  Administered 2023-08-28: 240 mg via INTRAVENOUS
  Filled 2023-08-28: qty 12

## 2023-08-28 MED ORDER — DEXAMETHASONE SODIUM PHOSPHATE 10 MG/ML IJ SOLN
10.0000 mg | Freq: Once | INTRAMUSCULAR | Status: AC
Start: 2023-08-28 — End: 2023-08-28
  Administered 2023-08-28: 10 mg via INTRAVENOUS
  Filled 2023-08-28: qty 1

## 2023-08-28 MED ORDER — DEXTROSE 5 % IV SOLN
Freq: Once | INTRAVENOUS | Status: DC
Start: 2023-08-28 — End: 2023-08-28

## 2023-08-28 MED ORDER — SODIUM CHLORIDE 0.9% FLUSH
10.0000 mL | INTRAVENOUS | Status: DC | PRN
  Administered 2023-08-28: 10 mL

## 2023-08-28 NOTE — Patient Instructions (Signed)
Fam-Trastuzumab Deruxtecan Injection What is this medication? FAM-TRASTUZUMAB DERUXTECAN (fam-tras TOOZ eu mab DER ux TEE kan) treats some types of cancer. It works by blocking a protein that causes cancer cells to grow and multiply. This helps to slow or stop the spread of cancer cells. This medicine may be used for other purposes; ask your health care provider or pharmacist if you have questions. COMMON BRAND NAME(S): ENHERTU What should I tell my care team before I take this medication? They need to know if you have any of these conditions: Heart disease Heart failure Infection, especially a viral infection, such as chickenpox, cold sores, or herpes Liver disease Lung or breathing disease, such as asthma or COPD An unusual or allergic reaction to fam-trastuzumab deruxtecan, other medications, foods, dyes, or preservatives Pregnant or trying to get pregnant Breast-feeding How should I use this medication? This medication is injected into a vein. It is given by your care team in a hospital or clinic setting. A special MedGuide will be given to you before each treatment. Be sure to read this information carefully each time. Talk to your care team about the use of this medication in children. Special care may be needed. Overdosage: If you think you have taken too much of this medicine contact a poison control center or emergency room at once. NOTE: This medicine is only for you. Do not share this medicine with others. What if I miss a dose? It is important not to miss your dose. Call your care team if you are unable to keep an appointment. What may interact with this medication? Interactions are not expected. This list may not describe all possible interactions. Give your health care provider a list of all the medicines, herbs, non-prescription drugs, or dietary supplements you use. Also tell them if you smoke, drink alcohol, or use illegal drugs. Some items may interact with your  medicine. What should I watch for while using this medication? Visit your care team for regular checks on your progress. Tell your care team if your symptoms do not start to get better or if they get worse. Your condition will be monitored carefully while you are receiving this medication. Do not become pregnant while taking this medication or for 7 months after stopping it. Women should inform their care team if they wish to become pregnant or think they might be pregnant. Men should not father a child while taking this medication and for 4 months after stopping it. There is potential for serious side effects to an unborn child. Talk to your care team for more information. Do not breast-feed an infant while taking this medication or for 7 months after the last dose. This medication has caused decreased sperm counts in some men. This may make it more difficult to father a child. Talk to your care team if you are concerned about your fertility. This medication may increase your risk to bruise or bleed. Call your care team if you notice any unusual bleeding. Be careful brushing or flossing your teeth or using a toothpick because you may get an infection or bleed more easily. If you have any dental work done, tell your dentist you are receiving this medication. This medication may cause dry eyes and blurred vision. If you wear contact lenses, you may feel some discomfort. Lubricating eye drops may help. See your care team if the problem does not go away or is severe. This medication may increase your risk of getting an infection. Call your care team for   advice if you get a fever, chills, sore throat, or other symptoms of a cold or flu. Do not treat yourself. Try to avoid being around people who are sick. Avoid taking medications that contain aspirin, acetaminophen, ibuprofen, naproxen, or ketoprofen unless instructed by your care team. These medications may hide a fever. What side effects may I notice from  receiving this medication? Side effects that you should report to your care team as soon as possible: Allergic reactions--skin rash, itching, hives, swelling of the face, lips, tongue, or throat Dry cough, shortness of breath or trouble breathing Infection--fever, chills, cough, sore throat, wounds that don't heal, pain or trouble when passing urine, general feeling of discomfort or being unwell Heart failure--shortness of breath, swelling of the ankles, feet, or hands, sudden weight gain, unusual weakness or fatigue Unusual bruising or bleeding Side effects that usually do not require medical attention (report these to your care team if they continue or are bothersome): Constipation Diarrhea Hair loss Muscle pain Nausea Vomiting This list may not describe all possible side effects. Call your doctor for medical advice about side effects. You may report side effects to FDA at 1-800-FDA-1088. Where should I keep my medication? This medication is given in a hospital or clinic. It will not be stored at home. NOTE: This sheet is a summary. It may not cover all possible information. If you have questions about this medicine, talk to your doctor, pharmacist, or health care provider.  2024 Elsevier/Gold Standard (2021-08-01 00:00:00)  

## 2023-09-02 ENCOUNTER — Telehealth: Payer: Self-pay | Admitting: Oncology

## 2023-09-02 ENCOUNTER — Encounter: Payer: Self-pay | Admitting: Oncology

## 2023-09-02 NOTE — Telephone Encounter (Signed)
Pt's daughter has been given Orvilla Fus, PFS at [facility] information to reach out about reapplying for [employer] Financial Assistance that will be needed in order to schedule CT Scan for November.

## 2023-09-05 ENCOUNTER — Telehealth: Payer: Self-pay | Admitting: Oncology

## 2023-09-05 NOTE — Telephone Encounter (Signed)
Transportation requests sent to [employer] for DOS: 11/19 , 11/20, 11/21 via email.

## 2023-09-09 ENCOUNTER — Encounter: Payer: Self-pay | Admitting: Oncology

## 2023-09-17 LAB — CBC AND DIFFERENTIAL
HCT: 37 (ref 36–46)
Hemoglobin: 12.5 (ref 12.0–16.0)
Neutrophils Absolute: 2.63
Platelets: 133 10*3/uL — AB (ref 150–400)
WBC: 3.6

## 2023-09-17 LAB — BASIC METABOLIC PANEL
BUN: 14 (ref 4–21)
CO2: 26 — AB (ref 13–22)
Chloride: 111 — AB (ref 99–108)
Creatinine: 0.8 (ref 0.5–1.1)
Glucose: 95
Potassium: 4.1 meq/L (ref 3.5–5.1)
Sodium: 140 (ref 137–147)

## 2023-09-17 LAB — CBC: RBC: 3.83 — AB (ref 3.87–5.11)

## 2023-09-17 LAB — HEPATIC FUNCTION PANEL
ALT: 36 U/L — AB (ref 7–35)
AST: 49 — AB (ref 13–35)
Alkaline Phosphatase: 125 (ref 25–125)
Bilirubin, Total: 0.5

## 2023-09-17 LAB — COMPREHENSIVE METABOLIC PANEL
Albumin: 3.6 (ref 3.5–5.0)
Calcium: 9.5 (ref 8.7–10.7)
eGFR: >60

## 2023-09-18 ENCOUNTER — Ambulatory Visit: Payer: Self-pay

## 2023-09-18 NOTE — Progress Notes (Signed)
[facility]  [address] [place],  [place]  [address] [phone]  Clinic Day:  09/20/2023  HISTORY OF PRESENT ILLNESS:  The patient is a 72 y.o. 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  She also had a subcarinal lymph node for which scans have consistently shown a positive response to therapy. She comes in today to go over her CT scans to ascertain her new disease baseline after 40 cycles of Enhertu. The patient claims to have tolerated her 40th cycle of treatment fairly well. However, she now claims to have had more headaches over the past few weeks.  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  The patient received 6 cycles of Taxotere/Herceptin/Perjeta before being switched to maintenance Perjeta/Herceptin, for which she took 73 cycles of treatment.  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  This all happened in the setting of a prolonged treatment break from TDM-1.  Since recurrence was found, the patient has been on Enhertu since May 2022.  VITALS:  Blood pressure 107/88, pulse 75, temperature 98.2 F (36.8 C), resp. rate 14, height 5\' 1"  (1.549 m), weight 147 lb 14.4 oz (67.1 kg), SpO2 98%.  Wt Readings from Last 3 Encounters:  09/19/23 147 lb 14.4 oz (67.1 kg)  08/28/23 146 lb (66.2 kg)  08/26/23 146 lb 8 oz (66.5 kg)    Body mass index is 27.95 kg/m.  Performance status (ECOG): 1 - Symptomatic but completely ambulatory  PHYSICAL EXAM:  Physical Exam Constitutional:      General: She is not in acute  distress.    Appearance: Normal appearance. She is normal weight.  HENT:     Head: Normocephalic and atraumatic.  Eyes:     General: No scleral icterus.    Extraocular Movements: Extraocular movements intact.     Conjunctiva/sclera: Conjunctivae normal.     Pupils: Pupils are equal, round, and reactive to light.  Cardiovascular:     Rate and Rhythm: Normal rate and regular rhythm.     Pulses: Normal pulses.     Heart sounds: Normal heart sounds. No murmur heard.    No friction rub. No gallop.  Pulmonary:     Effort: Pulmonary effort is normal. No respiratory distress.     Breath sounds: Normal breath sounds.  Abdominal:     General: Bowel sounds are normal. There is no distension.     Palpations: Abdomen is soft. There is no hepatomegaly, splenomegaly or mass.     Tenderness: There is no abdominal tenderness.  Musculoskeletal:        General: Normal range of motion.     Cervical back: Normal range of motion and neck supple.     Right lower leg: No edema.     Left lower leg: No edema.  Lymphadenopathy:     Cervical: No cervical adenopathy.  Skin:    General: Skin is warm and dry.  Neurological:     General: No focal deficit present.     Mental Status: She is alert and oriented to person, place, and time. Mental status is at  baseline.  Psychiatric:        Mood and Affect: Mood normal.        Behavior: Behavior normal.        Thought Content: Thought content normal.        Judgment: Judgment normal.   SCANS:  Her chest CT revealed the following: FINDINGS: Cardiovascular: The heart is normal in size. There is no pericardial effusion.  Aorta: The aorta is normal in caliber, measuring 2.8 cm in diameter.. No aortic aneurysm is identified. There is minor aortic atherosclerotic plaque. There is conventional anatomy of the great vessels. The main pulmonary artery measures 2.2 cm in diameter. Right Port-A-Cath terminates in the expected location of the cavoatrial  junction.  Mediastinum: There is no mediastinal, hilar, or axillary lymphadenopathy.  Lungs: Multiple subcentimeter pulmonary nodules, for example: *0.4 cm apical right upper lobe nodule (4:15); *0.5 cm right middle lobe nodule (4:51); *0.3 cm anterior right middle lobe nodule (4:58).  There is no evidence of focal consolidation. Pleura: No pleural effusions or pneumothorax is present.  There is a large hiatal hernia.  Chest wall: Soft tissue nodule within the inferior left breast measures 0.8 x 0.5 cm (2:44).  Upper abdomen: Multiple benign-appearing hepatic cysts, the largest of which measure 4.4 x 4.4 cm within the left lobe and 5.9 x 4.5 cm within the inferior right lobe. Right midpole renal cyst measures 2.4 x 2.0 cm. The partially imaged upper abdominal organs are otherwise unremarkable.  Osseous: Unremarkable.  IMPRESSION: 1. No acute cardiopulmonary abnormality. 2. Atherosclerotic vascular disease. 3. Multiple right subcentimeter pulmonary nodules of uncertain etiology and or significance. 4. Left breast soft tissue nodule. Attention to follow-up recommended with possible mammography and/or biopsy if warranted.  LABS:  Latest Reference Range & Units 08/26/23 00:00 08/26/23 13:49  Sodium 135 - 145 mmol/L  138  Potassium 3.5 - 5.1 mmol/L  3.9  Chloride 98 - 111 mmol/L  110  CO2 22 - 32 mmol/L  20 (L)  Glucose 70 - 99 mg/dL  696 (H)  BUN 8 - 23 mg/dL  11  Creatinine 2.95 - 1.00 mg/dL  2.84  Calcium 8.9 - 13.2 mg/dL  9.4  Anion gap 5 - 15   8  Alkaline Phosphatase 38 - 126 U/L  106  Albumin 3.5 - 5.0 g/dL  3.5  AST 15 - 41 U/L  43 (H)  ALT 0 - 44 U/L  38  Total Protein 6.5 - 8.1 g/dL  7.5  Total Bilirubin 0.3 - 1.2 mg/dL  0.9  GFR, Est Non African American >60 mL/min  >60  WBC  3.8 (E)   RBC 3.87 - 5.11  3.91 (E)   Hemoglobin 12.0 - 16.0  12.7 (E)   HCT 36 - 46  38 (E)   Platelets 150 - 400 K/uL 113 ! (E)   NEUT#  2.66 (E)   (L): Data is abnormally low (H): Data  is abnormally high !: Data is abnormal (E): External lab result  ASSESSMENT & PLAN:  Assessment/Plan:  A 72 y.o. 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  In clinic today, I went over her chest CT.  She will proceed with her 41st cycle of Enhertu today.  As pertains to her headaches, I will order a brain MRI before her next visit to ensure there is no evidence of recurrent CNS metastasis.  Overall, she appears to be doing well.  I will see her back in 3 weeks to go over her  brain MRI images, as well as to reevaluate her before heading into her 42nd cycle of Enhertu.  The patient understands all the plans discussed today and is in agreement with them.    Perel Hauschild Kirby Funk, MD

## 2023-09-19 ENCOUNTER — Inpatient Hospital Stay: Payer: Self-pay

## 2023-09-19 ENCOUNTER — Inpatient Hospital Stay: Payer: Self-pay | Attending: Oncology | Admitting: Oncology

## 2023-09-19 ENCOUNTER — Encounter: Payer: Self-pay | Admitting: Oncology

## 2023-09-19 ENCOUNTER — Other Ambulatory Visit: Payer: Self-pay

## 2023-09-19 ENCOUNTER — Other Ambulatory Visit: Payer: Self-pay | Admitting: Oncology

## 2023-09-19 VITALS — BP 107/88 | HR 75 | Temp 98.2°F | Resp 14 | Ht 61.0 in | Wt 147.9 lb

## 2023-09-19 DIAGNOSIS — Z170 Estrogen receptor positive status [ER+]: Secondary | ICD-10-CM | POA: Insufficient documentation

## 2023-09-19 DIAGNOSIS — C50412 Malignant neoplasm of upper-outer quadrant of left female breast: Secondary | ICD-10-CM

## 2023-09-19 DIAGNOSIS — Z171 Estrogen receptor negative status [ER-]: Secondary | ICD-10-CM

## 2023-09-19 DIAGNOSIS — C50919 Malignant neoplasm of unspecified site of unspecified female breast: Secondary | ICD-10-CM | POA: Insufficient documentation

## 2023-09-19 DIAGNOSIS — C7800 Secondary malignant neoplasm of unspecified lung: Secondary | ICD-10-CM | POA: Insufficient documentation

## 2023-09-19 DIAGNOSIS — C771 Secondary and unspecified malignant neoplasm of intrathoracic lymph nodes: Secondary | ICD-10-CM | POA: Insufficient documentation

## 2023-09-19 DIAGNOSIS — C7801 Secondary malignant neoplasm of right lung: Secondary | ICD-10-CM | POA: Insufficient documentation

## 2023-09-19 DIAGNOSIS — C7802 Secondary malignant neoplasm of left lung: Secondary | ICD-10-CM | POA: Insufficient documentation

## 2023-09-19 MED ORDER — PALONOSETRON HCL INJECTION 0.25 MG/5ML
0.2500 mg | Freq: Once | INTRAVENOUS | Status: AC
  Administered 2023-09-19: 0.25 mg via INTRAVENOUS
  Filled 2023-09-19: qty 5

## 2023-09-19 MED ORDER — HEPARIN SOD (PORK) LOCK FLUSH 100 UNIT/ML IV SOLN
500.0000 [IU] | Freq: Once | INTRAVENOUS | Status: AC | PRN
  Administered 2023-09-19: 500 [IU]

## 2023-09-19 MED ORDER — ACETAMINOPHEN 325 MG PO TABS
650.0000 mg | ORAL_TABLET | Freq: Once | ORAL | Status: AC
  Administered 2023-09-19: 650 mg via ORAL
  Filled 2023-09-19: qty 2

## 2023-09-19 MED ORDER — DEXTROSE 5 % IV SOLN: Freq: Once | INTRAVENOUS | Status: AC

## 2023-09-19 MED ORDER — FAM-TRASTUZUMAB DERUXTECAN-NXKI CHEMO 100 MG IV SOLR
3.6300 mg/kg | Freq: Once | INTRAVENOUS | Status: AC
  Administered 2023-09-19: 240 mg via INTRAVENOUS
  Filled 2023-09-19: qty 12

## 2023-09-19 MED ORDER — DIPHENHYDRAMINE HCL 25 MG PO CAPS
50.0000 mg | ORAL_CAPSULE | Freq: Once | ORAL | Status: AC
  Administered 2023-09-19: 50 mg via ORAL
  Filled 2023-09-19: qty 2

## 2023-09-19 MED ORDER — SODIUM CHLORIDE 0.9% FLUSH
10.0000 mL | INTRAVENOUS | Status: DC | PRN
  Administered 2023-09-19: 10 mL

## 2023-09-19 MED ORDER — DEXAMETHASONE SODIUM PHOSPHATE 10 MG/ML IJ SOLN
10.0000 mg | Freq: Once | INTRAMUSCULAR | Status: AC
  Administered 2023-09-19: 10 mg via INTRAVENOUS
  Filled 2023-09-19: qty 1

## 2023-09-19 NOTE — Progress Notes (Signed)
Ok to proceed with fam-trastuzumab today despite missing ECHO appointment.  This has been rescheduled to 12/11.

## 2023-09-20 ENCOUNTER — Other Ambulatory Visit: Payer: Self-pay

## 2023-09-20 ENCOUNTER — Telehealth: Payer: Self-pay | Admitting: Oncology

## 2023-09-20 NOTE — Telephone Encounter (Signed)
Transportation requested for the DOS: 12/10; 12/11; 12/12. Sent via email to [employer].

## 2023-09-22 ENCOUNTER — Encounter: Payer: Self-pay | Admitting: Oncology

## 2023-09-30 ENCOUNTER — Other Ambulatory Visit: Payer: Self-pay

## 2023-10-01 ENCOUNTER — Encounter: Payer: Self-pay | Admitting: Oncology

## 2023-10-09 ENCOUNTER — Ambulatory Visit: Payer: Self-pay | Attending: Oncology

## 2023-10-09 ENCOUNTER — Encounter: Payer: Self-pay | Admitting: Oncology

## 2023-10-09 DIAGNOSIS — Z171 Estrogen receptor negative status [ER-]: Secondary | ICD-10-CM

## 2023-10-09 DIAGNOSIS — C50412 Malignant neoplasm of upper-outer quadrant of left female breast: Secondary | ICD-10-CM

## 2023-10-09 DIAGNOSIS — Z0189 Encounter for other specified special examinations: Secondary | ICD-10-CM

## 2023-10-09 LAB — ECHOCARDIOGRAM COMPLETE: S' Lateral: 2.7 cm

## 2023-10-09 NOTE — Progress Notes (Signed)
[facility]  [address] [place],  [place]  [address] [phone]  Clinic Day:  10/10/2023  HISTORY OF PRESENT ILLNESS:  The patient is a 72 y.o. 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  She also had a subcarinal lymph node for which scans have consistently shown a positive response to therapy. She comes in today to be evaluated before heading into her 42nd cycle of Enhertu.  As also comes in today to go over her brain MRI, which was done because she has had headaches recently.   The patient claims to have tolerated her 41st cycle of treatment fairly well. However, she now claims to have had more headaches over the past few weeks.  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  The patient received 6 cycles of Taxotere/Herceptin/Perjeta before being switched to maintenance Perjeta/Herceptin, for which she took 73 cycles of treatment.  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  This all happened in the setting of a prolonged treatment break from TDM-1.  Since recurrence was found, the patient has been on Enhertu since May 2022.  VITALS:  Blood pressure 105/71, pulse 69, temperature 98.2 F (36.8 C), temperature source Oral, resp. rate 14, height 5\' 1"  (1.549 m), weight 147 lb 12.8 oz (67 kg), SpO2 98%.  Wt Readings from Last 3 Encounters:  10/10/23 147 lb 12.8 oz (67 kg)  09/19/23 147 lb 14.4 oz (67.1 kg)  08/28/23 146 lb (66.2 kg)    Body mass index is 27.93 kg/m.  Performance status (ECOG): 1 - Symptomatic but completely  ambulatory  PHYSICAL EXAM:  Physical Exam Constitutional:      General: She is not in acute distress.    Appearance: Normal appearance. She is normal weight.  HENT:     Head: Normocephalic and atraumatic.  Eyes:     General: No scleral icterus.    Extraocular Movements: Extraocular movements intact.     Conjunctiva/sclera: Conjunctivae normal.     Pupils: Pupils are equal, round, and reactive to light.  Cardiovascular:     Rate and Rhythm: Normal rate and regular rhythm.     Pulses: Normal pulses.     Heart sounds: Normal heart sounds. No murmur heard.    No friction rub. No gallop.  Pulmonary:     Effort: Pulmonary effort is normal. No respiratory distress.     Breath sounds: Normal breath sounds.  Abdominal:     General: Bowel sounds are normal. There is no distension.     Palpations: Abdomen is soft. There is no hepatomegaly, splenomegaly or mass.     Tenderness: There is no abdominal tenderness.  Musculoskeletal:        General: Normal range of motion.     Cervical back: Normal range of motion and neck supple.     Right lower leg: No edema.     Left lower leg: No edema.  Lymphadenopathy:     Cervical: No cervical adenopathy.  Skin:    General: Skin is warm and dry.  Neurological:     General: No focal deficit present.  Mental Status: She is alert and oriented to person, place, and time. Mental status is at baseline.  Psychiatric:        Mood and Affect: Mood normal.        Behavior: Behavior normal.        Thought Content: Thought content normal.        Judgment: Judgment normal.    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 No intraparenchymal hemorrhage is evident. No focus of restricted diffusion is identified to suggest acute or early subacute ischemia. There is no extra-axial fluid collection, mass effect, or shift of midline structures. The  basal cisterns are visualized.  The ventricles and cortical sulci are in proportion and consistent with the patient's age.  The midline structures demonstrate normal contours. The craniocervical junction is unremarkable. The flow voids of the large intracranial vessels are normal. The calvarium is unremarkable. No abnormal enhancement is identified.  The paranasal sinuses and mastoid air cells are clear.  IMPRESSION: No enhancing intracranial lesion. No acute intracranial abnormality. Postsurgical changes,as above.  LABS:  Latest Reference Range & Units 10/10/23 13:50  Sodium 135 - 145 mmol/L 142  Potassium 3.5 - 5.1 mmol/L 3.8  Chloride 98 - 111 mmol/L 108  CO2 22 - 32 mmol/L 23  Glucose 70 - 99 mg/dL 960 (H)  BUN 8 - 23 mg/dL 14  Creatinine 4.54 - 0.98 mg/dL 1.19  Calcium 8.9 - 14.7 mg/dL 9.3  Anion gap 5 - 15  11  Alkaline Phosphatase 38 - 126 U/L 140 (H)  Albumin 3.5 - 5.0 g/dL 3.5  AST 15 - 41 U/L 46 (H)  ALT 0 - 44 U/L 35  Total Protein 6.5 - 8.1 g/dL 7.2  Total Bilirubin <8.2 mg/dL 0.4  GFR, Est Non African American >60 mL/min >60  WBC 4.0 - 10.5 K/uL 2.0 (L)  RBC 3.87 - 5.11 MIL/uL 3.67 (L)  Hemoglobin 12.0 - 15.0 g/dL 95.6  HCT 21.3 - 08.6 % 34.6 (L)  MCV 80.0 - 100.0 fL 94.3  MCH 26.0 - 34.0 pg 32.7  MCHC 30.0 - 36.0 g/dL 57.8  RDW 46.9 - 62.9 % 14.6  Platelets 150 - 400 K/uL 118 (L)  nRBC 0.0 - 0.2 % 0 /100 WBC 0.00 0  Neutrophils % 51  Lymphocytes % 28  Monocytes Relative % 13  Eosinophil % 6  Basophil % 1  Immature Granulocytes % 1  NEUT# 1.7 - 7.7 K/uL 1.0 (L)  Lymphs Abs 0.7 - 4.0 K/uL 0.6 (L)  Monocyte # 0.1 - 1.0 K/uL 0.3  Eosinophils Absolute 0.0 - 0.5 K/uL 0.1  Basophils Absolute 0.0 - 0.1 K/uL 0.0  Abs Immature Granulocytes 0.00 - 0.07 K/uL 0.02  (H): Data is abnormally high (L): Data is abnormally low  ASSESSMENT & PLAN:  Assessment/Plan:  A 72 y.o. 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  In clinic today, I went  over her brain MRI images with her, for which she could see that she remains free of CNS metastasis.  In clinic today, most of this visit was spent reassuring the patient that she remains disease-free and essentially all of her symptoms in the past and present are non-cancerous in nature.  She feels comfortable proceeding with her 42nd cycle of Enhertu tonight.  As she will be out of town over the [employer], I will see her back in 4 weeks  before she heads into her 43rd cycle of treatment.  The patient understands all the plans discussed today and is in agreement with them.    Dshawn Mcnay Kirby Funk, MD

## 2023-10-10 ENCOUNTER — Inpatient Hospital Stay: Payer: Self-pay

## 2023-10-10 ENCOUNTER — Telehealth: Payer: Self-pay

## 2023-10-10 ENCOUNTER — Encounter: Payer: Self-pay | Admitting: Oncology

## 2023-10-10 ENCOUNTER — Inpatient Hospital Stay (HOSPITAL_BASED_OUTPATIENT_CLINIC_OR_DEPARTMENT_OTHER): Payer: Self-pay | Admitting: Oncology

## 2023-10-10 ENCOUNTER — Telehealth: Payer: Self-pay | Admitting: Oncology

## 2023-10-10 ENCOUNTER — Inpatient Hospital Stay: Payer: Self-pay | Attending: Oncology

## 2023-10-10 VITALS — BP 105/71 | HR 69 | Temp 98.2°F | Resp 14 | Ht 61.0 in | Wt 147.8 lb

## 2023-10-10 DIAGNOSIS — C50412 Malignant neoplasm of upper-outer quadrant of left female breast: Secondary | ICD-10-CM

## 2023-10-10 DIAGNOSIS — C7931 Secondary malignant neoplasm of brain: Secondary | ICD-10-CM | POA: Insufficient documentation

## 2023-10-10 DIAGNOSIS — Z171 Estrogen receptor negative status [ER-]: Secondary | ICD-10-CM

## 2023-10-10 DIAGNOSIS — Z170 Estrogen receptor positive status [ER+]: Secondary | ICD-10-CM | POA: Insufficient documentation

## 2023-10-10 DIAGNOSIS — Z5112 Encounter for antineoplastic immunotherapy: Secondary | ICD-10-CM | POA: Insufficient documentation

## 2023-10-10 DIAGNOSIS — C50919 Malignant neoplasm of unspecified site of unspecified female breast: Secondary | ICD-10-CM | POA: Insufficient documentation

## 2023-10-10 LAB — CBC WITH DIFFERENTIAL (CANCER CENTER ONLY)
Abs Immature Granulocytes: 0.02 10*3/uL (ref 0.00–0.07)
Basophils Absolute: 0 10*3/uL (ref 0.0–0.1)
Basophils Relative: 1 %
Eosinophils Absolute: 0.1 10*3/uL (ref 0.0–0.5)
Eosinophils Relative: 6 %
HCT: 34.6 % — ABNORMAL LOW (ref 36.0–46.0)
Hemoglobin: 12 g/dL (ref 12.0–15.0)
Immature Granulocytes: 1 %
Lymphocytes Relative: 28 %
Lymphs Abs: 0.6 10*3/uL — ABNORMAL LOW (ref 0.7–4.0)
MCH: 32.7 pg (ref 26.0–34.0)
MCHC: 34.7 g/dL (ref 30.0–36.0)
MCV: 94.3 fL (ref 80.0–100.0)
Monocytes Absolute: 0.3 10*3/uL (ref 0.1–1.0)
Monocytes Relative: 13 %
Neutro Abs: 1 10*3/uL — ABNORMAL LOW (ref 1.7–7.7)
Neutrophils Relative %: 51 %
Platelet Count: 118 10*3/uL — ABNORMAL LOW (ref 150–400)
RBC: 3.67 MIL/uL — ABNORMAL LOW (ref 3.87–5.11)
RDW: 14.6 % (ref 11.5–15.5)
WBC Count: 2 10*3/uL — ABNORMAL LOW (ref 4.0–10.5)
nRBC: 0 % (ref 0.0–0.2)
nRBC: 0 /100{WBCs}

## 2023-10-10 LAB — CMP (CANCER CENTER ONLY)
ALT: 35 U/L (ref 0–44)
AST: 46 U/L — ABNORMAL HIGH (ref 15–41)
Albumin: 3.5 g/dL (ref 3.5–5.0)
Alkaline Phosphatase: 140 U/L — ABNORMAL HIGH (ref 38–126)
Anion gap: 11 (ref 5–15)
BUN: 14 mg/dL (ref 8–23)
CO2: 23 mmol/L (ref 22–32)
Calcium: 9.3 mg/dL (ref 8.9–10.3)
Chloride: 108 mmol/L (ref 98–111)
Creatinine: 0.78 mg/dL (ref 0.44–1.00)
GFR, Estimated: >60 mL/min (ref >60)
Glucose, Bld: 108 mg/dL — ABNORMAL HIGH (ref 70–99)
Potassium: 3.8 mmol/L (ref 3.5–5.1)
Sodium: 142 mmol/L (ref 135–145)
Total Bilirubin: 0.4 mg/dL (ref <1.2)
Total Protein: 7.2 g/dL (ref 6.5–8.1)

## 2023-10-10 MED ORDER — DEXTROSE 5 % IV SOLN
Freq: Once | INTRAVENOUS | Status: AC
Start: 2023-10-10 — End: 2023-10-10

## 2023-10-10 MED ORDER — DIPHENHYDRAMINE HCL 25 MG PO CAPS
50.0000 mg | ORAL_CAPSULE | Freq: Once | ORAL | Status: AC
  Administered 2023-10-10: 50 mg via ORAL
  Filled 2023-10-10: qty 2

## 2023-10-10 MED ORDER — ACETAMINOPHEN 325 MG PO TABS
650.0000 mg | ORAL_TABLET | Freq: Once | ORAL | Status: AC
  Administered 2023-10-10: 650 mg via ORAL
  Filled 2023-10-10: qty 2

## 2023-10-10 MED ORDER — HEPARIN SOD (PORK) LOCK FLUSH 100 UNIT/ML IV SOLN
500.0000 [IU] | Freq: Once | INTRAVENOUS | Status: AC | PRN
  Administered 2023-10-10: 500 [IU]

## 2023-10-10 MED ORDER — SODIUM CHLORIDE 0.9% FLUSH
10.0000 mL | INTRAVENOUS | Status: DC | PRN
  Administered 2023-10-10: 10 mL

## 2023-10-10 MED ORDER — DEXAMETHASONE SODIUM PHOSPHATE 10 MG/ML IJ SOLN
10.0000 mg | Freq: Once | INTRAMUSCULAR | Status: AC
Start: 2023-10-10 — End: 2023-10-10
  Administered 2023-10-10: 10 mg via INTRAVENOUS
  Filled 2023-10-10: qty 1

## 2023-10-10 MED ORDER — FAM-TRASTUZUMAB DERUXTECAN-NXKI CHEMO 100 MG IV SOLR
3.6300 mg/kg | Freq: Once | INTRAVENOUS | Status: AC
  Administered 2023-10-10: 240 mg via INTRAVENOUS
  Filled 2023-10-10: qty 12

## 2023-10-10 MED ORDER — PALONOSETRON HCL INJECTION 0.25 MG/5ML
0.2500 mg | Freq: Once | INTRAVENOUS | Status: AC
  Administered 2023-10-10: 0.25 mg via INTRAVENOUS
  Filled 2023-10-10: qty 5

## 2023-10-10 NOTE — Progress Notes (Signed)
OK to treat with ANC of 1.0 per Dr. Melvyn Neth, given to Ardyth Gal, RN

## 2023-10-10 NOTE — Patient Instructions (Signed)
Fam-Trastuzumab Deruxtecan Injection What is this medication? FAM-TRASTUZUMAB DERUXTECAN (fam-tras TOOZ eu mab DER ux TEE kan) treats some types of cancer. It works by blocking a protein that causes cancer cells to grow and multiply. This helps to slow or stop the spread of cancer cells. This medicine may be used for other purposes; ask your health care provider or pharmacist if you have questions. COMMON BRAND NAME(S): ENHERTU What should I tell my care team before I take this medication? They need to know if you have any of these conditions: Heart disease Heart failure Infection, especially a viral infection, such as chickenpox, cold sores, or herpes Liver disease Lung or breathing disease, such as asthma or COPD An unusual or allergic reaction to fam-trastuzumab deruxtecan, other medications, foods, dyes, or preservatives Pregnant or trying to get pregnant Breast-feeding How should I use this medication? This medication is injected into a vein. It is given by your care team in a hospital or clinic setting. A special MedGuide will be given to you before each treatment. Be sure to read this information carefully each time. Talk to your care team about the use of this medication in children. Special care may be needed. Overdosage: If you think you have taken too much of this medicine contact a poison control center or emergency room at once. NOTE: This medicine is only for you. Do not share this medicine with others. What if I miss a dose? It is important not to miss your dose. Call your care team if you are unable to keep an appointment. What may interact with this medication? Interactions are not expected. This list may not describe all possible interactions. Give your health care provider a list of all the medicines, herbs, non-prescription drugs, or dietary supplements you use. Also tell them if you smoke, drink alcohol, or use illegal drugs. Some items may interact with your  medicine. What should I watch for while using this medication? Visit your care team for regular checks on your progress. Tell your care team if your symptoms do not start to get better or if they get worse. Your condition will be monitored carefully while you are receiving this medication. Do not become pregnant while taking this medication or for 7 months after stopping it. Women should inform their care team if they wish to become pregnant or think they might be pregnant. Men should not father a child while taking this medication and for 4 months after stopping it. There is potential for serious side effects to an unborn child. Talk to your care team for more information. Do not breast-feed an infant while taking this medication or for 7 months after the last dose. This medication has caused decreased sperm counts in some men. This may make it more difficult to father a child. Talk to your care team if you are concerned about your fertility. This medication may increase your risk to bruise or bleed. Call your care team if you notice any unusual bleeding. Be careful brushing or flossing your teeth or using a toothpick because you may get an infection or bleed more easily. If you have any dental work done, tell your dentist you are receiving this medication. This medication may cause dry eyes and blurred vision. If you wear contact lenses, you may feel some discomfort. Lubricating eye drops may help. See your care team if the problem does not go away or is severe. This medication may increase your risk of getting an infection. Call your care team for   advice if you get a fever, chills, sore throat, or other symptoms of a cold or flu. Do not treat yourself. Try to avoid being around people who are sick. Avoid taking medications that contain aspirin, acetaminophen, ibuprofen, naproxen, or ketoprofen unless instructed by your care team. These medications may hide a fever. What side effects may I notice from  receiving this medication? Side effects that you should report to your care team as soon as possible: Allergic reactions--skin rash, itching, hives, swelling of the face, lips, tongue, or throat Dry cough, shortness of breath or trouble breathing Infection--fever, chills, cough, sore throat, wounds that don't heal, pain or trouble when passing urine, general feeling of discomfort or being unwell Heart failure--shortness of breath, swelling of the ankles, feet, or hands, sudden weight gain, unusual weakness or fatigue Unusual bruising or bleeding Side effects that usually do not require medical attention (report these to your care team if they continue or are bothersome): Constipation Diarrhea Hair loss Muscle pain Nausea Vomiting This list may not describe all possible side effects. Call your doctor for medical advice about side effects. You may report side effects to FDA at 1-800-FDA-1088. Where should I keep my medication? This medication is given in a hospital or clinic. It will not be stored at home. NOTE: This sheet is a summary. It may not cover all possible information. If you have questions about this medicine, talk to your doctor, pharmacist, or health care provider.  2024 Elsevier/Gold Standard (2021-08-01 00:00:00)  

## 2023-10-10 NOTE — Telephone Encounter (Signed)
Patient has been scheduled for follow-up visit per 10/10/23 LOS.  Pt given an appt calendar with date and time.

## 2023-10-12 ENCOUNTER — Other Ambulatory Visit: Payer: Self-pay

## 2023-10-13 ENCOUNTER — Encounter: Payer: Self-pay | Admitting: Oncology

## 2023-10-13 NOTE — Progress Notes (Incomplete)
[facility]  [address] [place],  [place]  [address] [phone]  Clinic Day:  10/10/2023  HISTORY OF PRESENT ILLNESS:  The patient is a 72 y.o. 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  She also had a subcarinal lymph node for which scans have consistently shown a positive response to therapy. She comes in today to be evaluated before heading into her 42nd cycle of Enhertu.  As also comes in today to go over her brain MRI, which was done because she has had headaches recently.   The patient claims to have tolerated her 41st cycle of treatment fairly well. However, she now claims to have had more headaches over the past few weeks.  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  The patient received 6 cycles of Taxotere/Herceptin/Perjeta before being switched to maintenance Perjeta/Herceptin, for which she took 73 cycles of treatment.  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  This all happened in the setting of a prolonged treatment break from TDM-1.  Since recurrence was found, the patient has been on Enhertu since May 2022.  VITALS:  Blood pressure 105/71, pulse 69, temperature 98.2 F (36.8 C), temperature source Oral, resp. rate 14, height 5\' 1"  (1.549 m), weight 147 lb 12.8 oz (67 kg), SpO2 98%.  Wt Readings from Last 3 Encounters:  10/10/23 147 lb 12.8 oz (67 kg)  09/19/23 147 lb 14.4 oz (67.1 kg)  08/28/23 146 lb (66.2 kg)    Body mass index is 27.93 kg/m.  Performance status (ECOG): 1 - Symptomatic but completely  ambulatory  PHYSICAL EXAM:  Physical Exam Constitutional:      General: She is not in acute distress.    Appearance: Normal appearance. She is normal weight.  HENT:     Head: Normocephalic and atraumatic.  Eyes:     General: No scleral icterus.    Extraocular Movements: Extraocular movements intact.     Conjunctiva/sclera: Conjunctivae normal.     Pupils: Pupils are equal, round, and reactive to light.  Cardiovascular:     Rate and Rhythm: Normal rate and regular rhythm.     Pulses: Normal pulses.     Heart sounds: Normal heart sounds. No murmur heard.    No friction rub. No gallop.  Pulmonary:     Effort: Pulmonary effort is normal. No respiratory distress.     Breath sounds: Normal breath sounds.  Abdominal:     General: Bowel sounds are normal. There is no distension.     Palpations: Abdomen is soft. There is no hepatomegaly, splenomegaly or mass.     Tenderness: There is no abdominal tenderness.  Musculoskeletal:        General: Normal range of motion.     Cervical back: Normal range of motion and neck supple.     Right lower leg: No edema.     Left lower leg: No edema.  Lymphadenopathy:     Cervical: No cervical adenopathy.  Skin:    General: Skin is warm and dry.  Neurological:     General: No focal deficit present.  Mental Status: She is alert and oriented to person, place, and time. Mental status is at baseline.  Psychiatric:        Mood and Affect: Mood normal.        Behavior: Behavior normal.        Thought Content: Thought content normal.        Judgment: Judgment normal.    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 No intraparenchymal hemorrhage is evident. No focus of restricted diffusion is identified to suggest acute or early subacute ischemia. There is no extra-axial fluid collection, mass effect, or shift of midline structures. The  basal cisterns are visualized.  The ventricles and cortical sulci are in proportion and consistent with the patient's age.  The midline structures demonstrate normal contours. The craniocervical junction is unremarkable. The flow voids of the large intracranial vessels are normal. The calvarium is unremarkable. No abnormal enhancement is identified.  The paranasal sinuses and mastoid air cells are clear.  IMPRESSION: No enhancing intracranial lesion. No acute intracranial abnormality. Postsurgical changes,as above.  LABS:  Latest Reference Range & Units 10/10/23 13:50  Sodium 135 - 145 mmol/L 142  Potassium 3.5 - 5.1 mmol/L 3.8  Chloride 98 - 111 mmol/L 108  CO2 22 - 32 mmol/L 23  Glucose 70 - 99 mg/dL 440 (H)  BUN 8 - 23 mg/dL 14  Creatinine 3.47 - 4.25 mg/dL 9.56  Calcium 8.9 - 38.7 mg/dL 9.3  Anion gap 5 - 15  11  Alkaline Phosphatase 38 - 126 U/L 140 (H)  Albumin 3.5 - 5.0 g/dL 3.5  AST 15 - 41 U/L 46 (H)  ALT 0 - 44 U/L 35  Total Protein 6.5 - 8.1 g/dL 7.2  Total Bilirubin <5.6 mg/dL 0.4  GFR, Est Non African American >60 mL/min >60  WBC 4.0 - 10.5 K/uL 2.0 (L)  RBC 3.87 - 5.11 MIL/uL 3.67 (L)  Hemoglobin 12.0 - 15.0 g/dL 43.3  HCT 29.5 - 18.8 % 34.6 (L)  MCV 80.0 - 100.0 fL 94.3  MCH 26.0 - 34.0 pg 32.7  MCHC 30.0 - 36.0 g/dL 41.6  RDW 60.6 - 30.1 % 14.6  Platelets 150 - 400 K/uL 118 (L)  nRBC 0.0 - 0.2 % 0 /100 WBC 0.00 0  Neutrophils % 51  Lymphocytes % 28  Monocytes Relative % 13  Eosinophil % 6  Basophil % 1  Immature Granulocytes % 1  NEUT# 1.7 - 7.7 K/uL 1.0 (L)  Lymphs Abs 0.7 - 4.0 K/uL 0.6 (L)  Monocyte # 0.1 - 1.0 K/uL 0.3  Eosinophils Absolute 0.0 - 0.5 K/uL 0.1  Basophils Absolute 0.0 - 0.1 K/uL 0.0  Abs Immature Granulocytes 0.00 - 0.07 K/uL 0.02  (H): Data is abnormally high (L): Data is abnormally low  ASSESSMENT & PLAN:  Assessment/Plan:  A 72 y.o. 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  In clinic today, I went  over her chest CT.  She will proceed with her 41st cycle of Enhertu today.  As pertains to her headaches, I will order a brain MRI before her next visit to ensure there is no evidence of recurrent CNS metastasis.  Overall, she appears to be doing well.  I will see her back in 3 weeks to go over her brain MRI images, as well as to reevaluate her before heading into her 42nd cycle of Enhertu.  The patient understands all  the plans discussed today and is in agreement with them.    Deziyah Arvin Kirby Funk, MD

## 2023-10-21 ENCOUNTER — Encounter: Payer: Self-pay | Admitting: Oncology

## 2023-10-31 ENCOUNTER — Ambulatory Visit: Payer: Self-pay

## 2023-10-31 ENCOUNTER — Ambulatory Visit: Payer: Self-pay | Admitting: Oncology

## 2023-10-31 ENCOUNTER — Other Ambulatory Visit: Payer: Self-pay

## 2023-11-07 ENCOUNTER — Inpatient Hospital Stay: Payer: Self-pay

## 2023-11-07 ENCOUNTER — Other Ambulatory Visit: Payer: Self-pay

## 2023-11-07 ENCOUNTER — Ambulatory Visit: Payer: Self-pay | Admitting: Oncology

## 2023-11-11 ENCOUNTER — Other Ambulatory Visit: Payer: Self-pay | Admitting: Oncology

## 2023-11-11 DIAGNOSIS — C50412 Malignant neoplasm of upper-outer quadrant of left female breast: Secondary | ICD-10-CM

## 2023-11-17 NOTE — Progress Notes (Unsigned)
[facility]  [address] [place],  [place]  [address] [phone]  Clinic Day:  10/10/2023  HISTORY OF PRESENT ILLNESS:  The patient is a 73 y.o. 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  She also had a subcarinal lymph node for which scans have consistently shown a positive response to therapy. She comes in today to be evaluated before heading into her 43rd cycle of Enhertu.  As also comes in today to go over her brain MRI, which was done because she has had headaches recently.   The patient claims to have tolerated her 42nd cycle of treatment fairly well. However, she now claims to have had more headaches over the past few weeks.  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  The patient received 6 cycles of Taxotere/Herceptin/Perjeta before being switched to maintenance Perjeta/Herceptin, for which she took 73 cycles of treatment.  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  This all happened in the setting of a prolonged treatment break from TDM-1.  Since recurrence was found, the patient has been on Enhertu since May 2022.  VITALS:  There were no vitals taken for this visit.  Wt Readings from Last 3 Encounters:  10/10/23 147 lb 12.8 oz (67 kg)  09/19/23 147 lb 14.4 oz (67.1 kg)  08/28/23 146 lb (66.2 kg)    There is no height or weight on file to calculate BMI.  Performance status (ECOG): 1 - Symptomatic but completely ambulatory  PHYSICAL EXAM:  Physical Exam Constitutional:      General: She is not in acute  distress.    Appearance: Normal appearance. She is normal weight.  HENT:     Head: Normocephalic and atraumatic.  Eyes:     General: No scleral icterus.    Extraocular Movements: Extraocular movements intact.     Conjunctiva/sclera: Conjunctivae normal.     Pupils: Pupils are equal, round, and reactive to light.  Cardiovascular:     Rate and Rhythm: Normal rate and regular rhythm.     Pulses: Normal pulses.     Heart sounds: Normal heart sounds. No murmur heard.    No friction rub. No gallop.  Pulmonary:     Effort: Pulmonary effort is normal. No respiratory distress.     Breath sounds: Normal breath sounds.  Abdominal:     General: Bowel sounds are normal. There is no distension.     Palpations: Abdomen is soft. There is no hepatomegaly, splenomegaly or mass.     Tenderness: There is no abdominal tenderness.  Musculoskeletal:        General: Normal range of motion.     Cervical back: Normal range of motion and neck supple.     Right lower leg: No edema.     Left lower leg: No edema.  Lymphadenopathy:     Cervical: No cervical adenopathy.  Skin:    General: Skin is warm and dry.  Neurological:     General: No focal deficit present.     Mental Status: She is alert and oriented to person, place, and time. Mental  status is at baseline.  Psychiatric:        Mood and Affect: Mood normal.        Behavior: Behavior normal.        Thought Content: Thought content normal.        Judgment: Judgment normal.    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 No intraparenchymal hemorrhage is evident. No focus of restricted diffusion is identified to suggest acute or early subacute ischemia. There is no extra-axial fluid collection, mass effect, or shift of midline structures. The basal cisterns are visualized.  The ventricles and cortical sulci are in proportion and consistent  with the patient's age.  The midline structures demonstrate normal contours. The craniocervical junction is unremarkable. The flow voids of the large intracranial vessels are normal. The calvarium is unremarkable. No abnormal enhancement is identified.  The paranasal sinuses and mastoid air cells are clear.  IMPRESSION: No enhancing intracranial lesion. No acute intracranial abnormality. Postsurgical changes,as above.  LABS:  Latest Reference Range & Units 10/10/23 13:50  Sodium 135 - 145 mmol/L 142  Potassium 3.5 - 5.1 mmol/L 3.8  Chloride 98 - 111 mmol/L 108  CO2 22 - 32 mmol/L 23  Glucose 70 - 99 mg/dL 161 (H)  BUN 8 - 23 mg/dL 14  Creatinine 0.96 - 0.45 mg/dL 4.09  Calcium 8.9 - 81.1 mg/dL 9.3  Anion gap 5 - 15  11  Alkaline Phosphatase 38 - 126 U/L 140 (H)  Albumin 3.5 - 5.0 g/dL 3.5  AST 15 - 41 U/L 46 (H)  ALT 0 - 44 U/L 35  Total Protein 6.5 - 8.1 g/dL 7.2  Total Bilirubin <9.1 mg/dL 0.4  GFR, Est Non African American >60 mL/min >60  WBC 4.0 - 10.5 K/uL 2.0 (L)  RBC 3.87 - 5.11 MIL/uL 3.67 (L)  Hemoglobin 12.0 - 15.0 g/dL 47.8  HCT 29.5 - 62.1 % 34.6 (L)  MCV 80.0 - 100.0 fL 94.3  MCH 26.0 - 34.0 pg 32.7  MCHC 30.0 - 36.0 g/dL 30.8  RDW 65.7 - 84.6 % 14.6  Platelets 150 - 400 K/uL 118 (L)  nRBC 0.0 - 0.2 % 0 /100 WBC 0.00 0  Neutrophils % 51  Lymphocytes % 28  Monocytes Relative % 13  Eosinophil % 6  Basophil % 1  Immature Granulocytes % 1  NEUT# 1.7 - 7.7 K/uL 1.0 (L)  Lymphs Abs 0.7 - 4.0 K/uL 0.6 (L)  Monocyte # 0.1 - 1.0 K/uL 0.3  Eosinophils Absolute 0.0 - 0.5 K/uL 0.1  Basophils Absolute 0.0 - 0.1 K/uL 0.0  Abs Immature Granulocytes 0.00 - 0.07 K/uL 0.02  (H): Data is abnormally high (L): Data is abnormally low  ASSESSMENT & PLAN:  Assessment/Plan:  A 73 y.o. 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  In clinic today, I went over her brain MRI images with her, for which she could see that she remains free of CNS metastasis.   In clinic today, most of this visit was spent reassuring the patient that she remains disease-free and essentially all of her symptoms in the past and present are non-cancerous in nature.  She feels comfortable proceeding with her 42nd cycle of Enhertu tonight.  As she will be out of town over the [employer], I will see her back in 4 weeks before she heads into her 43rd cycle of treatment.  The patient understands  all the plans discussed today and is in agreement with them.    Elim Economou Kirby Funk, MD

## 2023-11-18 ENCOUNTER — Inpatient Hospital Stay: Payer: Self-pay

## 2023-11-18 ENCOUNTER — Inpatient Hospital Stay: Payer: Self-pay | Attending: Oncology | Admitting: Oncology

## 2023-11-18 ENCOUNTER — Inpatient Hospital Stay: Payer: Self-pay | Attending: Oncology

## 2023-11-18 ENCOUNTER — Encounter: Payer: Self-pay | Admitting: Oncology

## 2023-11-18 VITALS — BP 135/82 | HR 69 | Temp 97.8°F | Resp 14 | Ht 61.0 in | Wt 147.7 lb

## 2023-11-18 DIAGNOSIS — Z1731 Human epidermal growth factor receptor 2 positive status: Secondary | ICD-10-CM | POA: Insufficient documentation

## 2023-11-18 DIAGNOSIS — C50412 Malignant neoplasm of upper-outer quadrant of left female breast: Secondary | ICD-10-CM | POA: Insufficient documentation

## 2023-11-18 DIAGNOSIS — Z171 Estrogen receptor negative status [ER-]: Secondary | ICD-10-CM

## 2023-11-18 DIAGNOSIS — Z5112 Encounter for antineoplastic immunotherapy: Secondary | ICD-10-CM | POA: Insufficient documentation

## 2023-11-18 DIAGNOSIS — C7931 Secondary malignant neoplasm of brain: Secondary | ICD-10-CM | POA: Insufficient documentation

## 2023-11-18 DIAGNOSIS — C50919 Malignant neoplasm of unspecified site of unspecified female breast: Secondary | ICD-10-CM

## 2023-11-18 LAB — CBC WITH DIFFERENTIAL (CANCER CENTER ONLY)
Abs Immature Granulocytes: 0.01 10*3/uL (ref 0.00–0.07)
Basophils Absolute: 0 10*3/uL (ref 0.0–0.1)
Basophils Relative: 0 %
Eosinophils Absolute: 0.1 10*3/uL (ref 0.0–0.5)
Eosinophils Relative: 2 %
HCT: 36.3 % (ref 36.0–46.0)
Hemoglobin: 12.3 g/dL (ref 12.0–15.0)
Immature Granulocytes: 0 %
Immature Platelet Fraction: 1.4 % (ref 1.2–8.6)
Lymphocytes Relative: 17 %
Lymphs Abs: 0.8 10*3/uL (ref 0.7–4.0)
MCH: 31.9 pg (ref 26.0–34.0)
MCHC: 33.9 g/dL (ref 30.0–36.0)
MCV: 94 fL (ref 80.0–100.0)
Monocytes Absolute: 0.3 10*3/uL (ref 0.1–1.0)
Monocytes Relative: 6 %
Neutro Abs: 3.4 10*3/uL (ref 1.7–7.7)
Neutrophils Relative %: 75 %
Platelet Count: 133 10*3/uL — ABNORMAL LOW (ref 150–400)
RBC: 3.86 MIL/uL — ABNORMAL LOW (ref 3.87–5.11)
RDW: 14.1 % (ref 11.5–15.5)
WBC Count: 4.5 10*3/uL (ref 4.0–10.5)
nRBC: 0 % (ref 0.0–0.2)
nRBC: 0 /100{WBCs}

## 2023-11-18 LAB — CMP (CANCER CENTER ONLY)
ALT: 31 U/L (ref 0–44)
AST: 41 U/L (ref 15–41)
Albumin: 3.8 g/dL (ref 3.5–5.0)
Alkaline Phosphatase: 131 U/L — ABNORMAL HIGH (ref 38–126)
Anion gap: 8 (ref 5–15)
BUN: 10 mg/dL (ref 8–23)
CO2: 25 mmol/L (ref 22–32)
Calcium: 9.4 mg/dL (ref 8.9–10.3)
Chloride: 106 mmol/L (ref 98–111)
Creatinine: 0.72 mg/dL (ref 0.44–1.00)
GFR, Estimated: >60 mL/min (ref >60)
Glucose, Bld: 101 mg/dL — ABNORMAL HIGH (ref 70–99)
Potassium: 3.9 mmol/L (ref 3.5–5.1)
Sodium: 140 mmol/L (ref 135–145)
Total Bilirubin: 0.4 mg/dL (ref 0.0–1.2)
Total Protein: 7.5 g/dL (ref 6.5–8.1)

## 2023-11-18 MED ORDER — FAM-TRASTUZUMAB DERUXTECAN-NXKI CHEMO 100 MG IV SOLR
3.6300 mg/kg | Freq: Once | INTRAVENOUS | Status: AC
  Administered 2023-11-18: 240 mg via INTRAVENOUS
  Filled 2023-11-18: qty 12

## 2023-11-18 MED ORDER — SODIUM CHLORIDE 0.9% FLUSH
10.0000 mL | INTRAVENOUS | Status: DC | PRN
  Administered 2023-11-18: 10 mL

## 2023-11-18 MED ORDER — HEPARIN SOD (PORK) LOCK FLUSH 100 UNIT/ML IV SOLN
500.0000 [IU] | Freq: Once | INTRAVENOUS | Status: AC | PRN
  Administered 2023-11-18: 500 [IU]

## 2023-11-18 MED ORDER — PALONOSETRON HCL INJECTION 0.25 MG/5ML
0.2500 mg | Freq: Once | INTRAVENOUS | Status: AC
  Administered 2023-11-18: 0.25 mg via INTRAVENOUS
  Filled 2023-11-18: qty 5

## 2023-11-18 MED ORDER — DIPHENHYDRAMINE HCL 25 MG PO CAPS
50.0000 mg | ORAL_CAPSULE | Freq: Once | ORAL | Status: AC
Start: 2023-11-18 — End: 2023-11-18
  Administered 2023-11-18: 50 mg via ORAL
  Filled 2023-11-18: qty 2

## 2023-11-18 MED ORDER — DEXTROSE 5 % IV SOLN: Freq: Once | INTRAVENOUS | Status: AC

## 2023-11-18 MED ORDER — DEXAMETHASONE SODIUM PHOSPHATE 10 MG/ML IJ SOLN
10.0000 mg | Freq: Once | INTRAMUSCULAR | Status: AC
  Administered 2023-11-18: 10 mg via INTRAVENOUS
  Filled 2023-11-18: qty 1

## 2023-11-18 MED ORDER — ACETAMINOPHEN 325 MG PO TABS
650.0000 mg | ORAL_TABLET | Freq: Once | ORAL | Status: AC
Start: 2023-11-18 — End: 2023-11-18
  Administered 2023-11-18: 650 mg via ORAL
  Filled 2023-11-18: qty 2

## 2023-11-18 NOTE — Progress Notes (Signed)
[facility]  [address] [place],  [place]  [address] [phone]  Clinic Day:  11/18/2023  Referring physician: Leanna Sato, MD   HISTORY OF PRESENT ILLNESS:  The patient is a 73 y.o. 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  She also had a subcarinal lymph node for which scans have consistently shown a positive response to therapy. She comes in today to be evaluated before heading into her 43rd cycle of Enhertu.  The patient claims to have tolerated her 42nd cycle of treatment fairly well.  She is somewhat emotional today as she wants to spend extended time with family in [place], but is concerned about staying on schedule with her Enhertu therapy.  She denies having any new symptoms/findings which concern her for overt signs of disease progression.    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  The patient received 6 cycles of Taxotere/Herceptin/Perjeta before being switched to maintenance Perjeta/Herceptin, for which she took 73 cycles of treatment.  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  This all happened in the setting of a prolonged treatment break from TDM-1.  Since recurrence was found, the patient has been on Enhertu since May 2022.   PHYSICAL EXAM:  Blood pressure 135/82, pulse 69, temperature 97.8 F (36.6 C), temperature source Oral, resp. rate 14, height 5\' 1"  (1.549 m), weight 147 lb 11.2 oz (67 kg), SpO2 96%. Wt Readings from Last 3 Encounters:  11/18/23 147 lb 11.2 oz (67 kg)  10/10/23 147 lb 12.8 oz  (67 kg)  09/19/23 147 lb 14.4 oz (67.1 kg)   Body mass index is 27.91 kg/m. Performance status (ECOG): 1 - Symptomatic but completely ambulatory Physical Exam Constitutional:      Appearance: Normal appearance. She is not ill-appearing.  HENT:     Mouth/Throat:     Mouth: Mucous membranes are moist.     Pharynx: Oropharynx is clear. No oropharyngeal exudate or posterior oropharyngeal erythema.  Cardiovascular:     Rate and Rhythm: Normal rate and regular rhythm.     Heart sounds: No murmur heard.    No friction rub. No gallop.  Pulmonary:     Effort: Pulmonary effort is normal. No respiratory distress.     Breath sounds: Normal breath sounds. No wheezing, rhonchi or rales.  Abdominal:     General: Bowel sounds are normal. There is no distension.     Palpations: Abdomen is soft. There is no mass.     Tenderness: There is no abdominal tenderness.  Musculoskeletal:        General: No swelling.     Right lower leg: No edema.     Left lower leg: No edema.  Lymphadenopathy:     Cervical: No cervical adenopathy.     Upper Body:     Right upper body: No supraclavicular or axillary adenopathy.     Left upper body: No supraclavicular or axillary adenopathy.     Lower Body: No right inguinal adenopathy. No left inguinal adenopathy.  Skin:    General: Skin is warm.  Coloration: Skin is not jaundiced.     Findings: No lesion or rash.  Neurological:     General: No focal deficit present.     Mental Status: She is alert and oriented to person, place, and time. Mental status is at baseline.  Psychiatric:        Mood and Affect: Mood normal.        Behavior: Behavior normal.        Thought Content: Thought content normal.     LABS:      Latest Ref Rng & Units 11/18/2023    1:13 PM 10/10/2023    1:50 PM 09/17/2023   12:00 AM  CBC  WBC 4.0 - 10.5 K/uL 4.5  2.0  3.6      Hemoglobin 12.0 - 15.0 g/dL 56.2  13.0  86.5      Hematocrit 36.0 - 46.0 % 36.3  34.6  37      Platelets  150 - 400 K/uL 133  118  133         This result is from an external source.      Latest Ref Rng & Units 11/18/2023    1:13 PM 10/10/2023    1:50 PM 09/17/2023   12:00 AM  CMP  Glucose 70 - 99 mg/dL 784  696    BUN 8 - 23 mg/dL 10  14  14       Creatinine 0.44 - 1.00 mg/dL 2.95  2.84  0.8      Sodium 135 - 145 mmol/L 140  142  140      Potassium 3.5 - 5.1 mmol/L 3.9  3.8  4.1      Chloride 98 - 111 mmol/L 106  108  111      CO2 22 - 32 mmol/L 25  23  26       Calcium 8.9 - 10.3 mg/dL 9.4  9.3  9.5      Total Protein 6.5 - 8.1 g/dL 7.5  7.2    Total Bilirubin 0.0 - 1.2 mg/dL 0.4  0.4    Alkaline Phos 38 - 126 U/L 131  140  125      AST 15 - 41 U/L 41  46  49      ALT 0 - 44 U/L 31  35  36         This result is from an external source.   ASSESSMENT & PLAN:  Assessment/Plan:  A 73 y.o. 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  She will proceed with her 43rd cycle of Enhertu today.  Overall, she appears to be doing very well.  As she wants to spend more time with family out in [place], I will space her next visit out to 4 weeks before she heads into her 44th cycle of treatment.  The patient understands all the plans discussed today and is in agreement with them.     Alexarae Oliva Kirby Funk, MD

## 2023-11-18 NOTE — Patient Instructions (Signed)
Instrucciones al darle de alta: Discharge Instructions Gracias por elegir al [facility] de Cncer de [place] para brindarle atencin mdica de oncologa y Teacher, English as a foreign language.   Si usted tiene una cita de laboratorio con [employer] de [place], por favor vaya directamente al [employer] de Cncer y regstrese en el rea de Engineer, maintenance (IT).   Use ropa cmoda y [place] para tener fcil acceso a las vas del Portacath (acceso venoso de Set designer duracin) o la lnea PICC (catter central colocado por va perifrica).   Nos esforzamos por ofrecerle tiempo de calidad con su proveedor. Es posible que tenga que volver a programar su cita si llega tarde (15 minutos o ms).  El llegar tarde le afecta a usted y a otros pacientes cuyas citas son posteriores a Armed forces operational officer.  Adems, si usted falta a tres o ms citas sin avisar a la oficina, puede ser retirado(a) de la clnica a discrecin del proveedor.      Para las solicitudes de renovacin de recetas, pida a su farmacia que se ponga en contacto con nuestra oficina y deje que transcurran 72 horas para que se complete el proceso de las renovaciones.    Hoy usted recibi los siguientes agentes de quimioterapia e/o inmunoterapia Enhertu      Para ayudar a prevenir las nuseas y los vmitos despus de su tratamiento, le recomendamos que tome su medicamento para las nuseas segn las indicaciones.  LOS SNTOMAS QUE DEBEN COMUNICARSE INMEDIATAMENTE SE INDICAN A CONTINUACIN: *FIEBRE SUPERIOR A 100.4 F (38 C) O MS *ESCALOFROS O SUDORACIN *NUSEAS Y VMITOS QUE NO SE CONTROLAN CON EL MEDICAMENTO PARA LAS NUSEAS *DIFICULTAD INUSUAL PARA RESPIRAR  *MORETONES O HEMORRAGIAS NO HABITUALES *PROBLEMAS URINARIOS (dolor o ardor al Geographical information systems officer o frecuencia para Geographical information systems officer) *PROBLEMAS INTESTINALES (diarrea inusual, estreimiento, dolor cerca del ano) SENSIBILIDAD EN LA BOCA Y EN LA GARGANTA CON O SIN LA PRESENCIA DE LCERAS (dolor de garganta, llagas en la boca o dolor de muelas/dientes) ERUPCIN,  HINCHAZN O DOLORES INUSUALES FLUJO VAGINAL INUSUAL O PICAZN/RASQUIA    Los puntos marcados con un asterisco ( *) indican una posible emergencia y debe hacer un seguimiento tan pronto como le sea posible o vaya al Departamento de Emergencias si se le presenta algn problema.  Por favor, muestre la [place] DE ADVERTENCIA DE Marc Morgans DE ADVERTENCIA DE Gardiner Fanti al registrarse en [address] de Emergencias y a la enfermera de triaje.  Si tiene preguntas despus de su visita o necesita cancelar o volver a programar su cita, por favor pngase en contacto con [facility] - A DEPT OF [facility]  Dept: [phone]  y siga las instrucciones. Las horas de oficina son de 8:00 a.m. a 4:30 p.m. de lunes a viernes. Por favor, tenga en cuenta que los mensajes de voz que se dejan despus de las 4:00 p.m. posiblemente no se devolvern hasta el siguiente da de [place].  Cerramos los fines de semana y [employer]. En todo momento tiene acceso a una enfermera para preguntas urgentes. Por favor, llame al nmero principal de la clnica Dept: [phone] y siga las instrucciones.   Para cualquier pregunta que no sea de carcter urgente, tambin puede ponerse en contacto con su proveedor [employer]. Ahora ofrecemos visitas electrnicas para cualquier persona mayor de 18 aos que solicite atencin mdica en lnea para los sntomas que no sean urgentes. Para ms detalles vaya a [email or web address].   Tambin puede bajar la aplicacin de MyChart! Vaya a la tienda  de aplicaciones, busque "MyChart", abra la aplicacin, seleccione [place], e ingrese con su nombre de usuario y la contrasea de Clinical cytogeneticist.

## 2023-11-19 ENCOUNTER — Telehealth: Payer: Self-pay | Admitting: Oncology

## 2023-11-19 NOTE — Telephone Encounter (Signed)
Per pt's daughter, her mother will be leaving town for possibly 1 month but unsure of the dates as of yet. She says that her mother notified Dr. Melvyn Neth of this just yesterday but wishes to plan her dates appropriately prior to scheduling any future appts.  Daughter just wishes to give [place] a call at a later date.    Per 11/18/23 LOS  Follow-Up Information  Follow-up disposition: Return in about 4 weeks (around 12/16/2023).  Check out comments: labs

## 2023-12-09 ENCOUNTER — Telehealth: Payer: Self-pay | Admitting: Oncology

## 2023-12-09 NOTE — Telephone Encounter (Signed)
 Transportation request sent to [employer] via email for DOS 12/16/23.

## 2023-12-10 ENCOUNTER — Other Ambulatory Visit: Payer: Self-pay

## 2023-12-12 ENCOUNTER — Other Ambulatory Visit: Payer: Self-pay

## 2023-12-15 NOTE — Progress Notes (Unsigned)
[facility]  [address] [place],  [place]  [address] [phone]  Clinic Day:  12/16/2023  Referring physician: Leanna Sato, MD   HISTORY OF PRESENT ILLNESS:  The patient is a 73 y.o. 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  She also had a subcarinal lymph node for which scans have consistently shown a positive response to therapy. She comes in today to be evaluated before heading into her 44th cycle of Enhertu.  The patient claims to have tolerated her 43rd cycle of treatment fairly well.  She denies having any new symptoms/findings which concern her for overt signs of disease progression.    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  The patient received 6 cycles of Taxotere/Herceptin/Perjeta before being switched to maintenance Perjeta/Herceptin, for which she took 73 cycles of treatment.  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  This all happened in the setting of a prolonged treatment break from TDM-1.  Since recurrence was found, the patient has been on Enhertu since May 2022.   PHYSICAL EXAM:  Blood pressure (!) 111/59, pulse 61, temperature 98.1 F (36.7 C), temperature source Oral, resp. rate 16, height 5\' 1"  (1.549 m), weight 148 lb 11.2 oz (67.4 kg), SpO2 97%. Wt Readings from Last 3 Encounters:  12/16/23 148 lb 11.2 oz (67.4 kg)  11/18/23 147 lb 11.2 oz (67 kg)  10/10/23 147 lb 12.8 oz (67 kg)   Body mass index is 28.1 kg/m. Performance status (ECOG): 1 - Symptomatic but completely  ambulatory Physical Exam Constitutional:      Appearance: Normal appearance. She is not ill-appearing.  HENT:     Mouth/Throat:     Mouth: Mucous membranes are moist.     Pharynx: Oropharynx is clear. No oropharyngeal exudate or posterior oropharyngeal erythema.  Cardiovascular:     Rate and Rhythm: Normal rate and regular rhythm.     Heart sounds: No murmur heard.    No friction rub. No gallop.  Pulmonary:     Effort: Pulmonary effort is normal. No respiratory distress.     Breath sounds: Normal breath sounds. No wheezing, rhonchi or rales.  Abdominal:     General: Bowel sounds are normal. There is no distension.     Palpations: Abdomen is soft. There is no mass.     Tenderness: There is no abdominal tenderness.  Musculoskeletal:        General: No swelling.     Right lower leg: No edema.     Left lower leg: No edema.  Lymphadenopathy:     Cervical: No cervical adenopathy.     Upper Body:     Right upper body: No supraclavicular or axillary adenopathy.     Left upper body: No supraclavicular or axillary adenopathy.     Lower Body: No right inguinal adenopathy. No left inguinal adenopathy.  Skin:    General: Skin is warm.     Coloration: Skin is not jaundiced.     Findings: No lesion or rash.  Neurological:     General: No focal deficit present.  Mental Status: She is alert and oriented to person, place, and time. Mental status is at baseline.  Psychiatric:        Mood and Affect: Mood normal.        Behavior: Behavior normal.        Thought Content: Thought content normal.    LABS:      Latest Ref Rng & Units 12/16/2023    1:10 PM 11/18/2023    1:13 PM 10/10/2023    1:50 PM  CBC  WBC 4.0 - 10.5 K/uL 3.1  4.5  2.0   Hemoglobin 12.0 - 15.0 g/dL 16.1  09.6  04.5   Hematocrit 36.0 - 46.0 % 34.6  36.3  34.6   Platelets 150 - 400 K/uL 112  133  118       Latest Ref Rng & Units 12/16/2023    1:10 PM 11/18/2023    1:13 PM 10/10/2023    1:50 PM  CMP  Glucose 70 -  99 mg/dL 409  811  914   BUN 8 - 23 mg/dL 14  10  14    Creatinine 0.44 - 1.00 mg/dL 7.82  9.56  2.13   Sodium 135 - 145 mmol/L 143  140  142   Potassium 3.5 - 5.1 mmol/L 3.8  3.9  3.8   Chloride 98 - 111 mmol/L 111  106  108   CO2 22 - 32 mmol/L 23  25  23    Calcium 8.9 - 10.3 mg/dL 9.4  9.4  9.3   Total Protein 6.5 - 8.1 g/dL 7.0  7.5  7.2   Total Bilirubin 0.0 - 1.2 mg/dL 0.3  0.4  0.4   Alkaline Phos 38 - 126 U/L 114  131  140   AST 15 - 41 U/L 37  41  46   ALT 0 - 44 U/L 25  31  35    ASSESSMENT & PLAN:  Assessment/Plan:  A 73 y.o. 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  She will proceed with her 44th cycle of Enhertu today.  Overall, she appears to be doing very well.  I will see her back in 3 weeks before she heads into her 45th cycle of treatment.  The patient understands all the plans discussed today and is in agreement with them.     Fabrice Dyal Kirby Funk, MD

## 2023-12-16 ENCOUNTER — Inpatient Hospital Stay: Payer: Self-pay | Attending: Oncology | Admitting: Oncology

## 2023-12-16 ENCOUNTER — Telehealth: Payer: Self-pay | Admitting: Oncology

## 2023-12-16 ENCOUNTER — Inpatient Hospital Stay: Payer: Self-pay

## 2023-12-16 VITALS — BP 111/59 | HR 61 | Temp 98.1°F | Resp 16 | Ht 61.0 in | Wt 148.7 lb

## 2023-12-16 DIAGNOSIS — Z1731 Human epidermal growth factor receptor 2 positive status: Secondary | ICD-10-CM | POA: Insufficient documentation

## 2023-12-16 DIAGNOSIS — Z171 Estrogen receptor negative status [ER-]: Secondary | ICD-10-CM

## 2023-12-16 DIAGNOSIS — C7951 Secondary malignant neoplasm of bone: Secondary | ICD-10-CM | POA: Insufficient documentation

## 2023-12-16 DIAGNOSIS — Z5112 Encounter for antineoplastic immunotherapy: Secondary | ICD-10-CM | POA: Insufficient documentation

## 2023-12-16 DIAGNOSIS — C50919 Malignant neoplasm of unspecified site of unspecified female breast: Secondary | ICD-10-CM | POA: Insufficient documentation

## 2023-12-16 DIAGNOSIS — C50412 Malignant neoplasm of upper-outer quadrant of left female breast: Secondary | ICD-10-CM

## 2023-12-16 LAB — CMP (CANCER CENTER ONLY)
ALT: 25 U/L (ref 0–44)
AST: 37 U/L (ref 15–41)
Albumin: 3.8 g/dL (ref 3.5–5.0)
Alkaline Phosphatase: 114 U/L (ref 38–126)
Anion gap: 8 (ref 5–15)
BUN: 14 mg/dL (ref 8–23)
CO2: 23 mmol/L (ref 22–32)
Calcium: 9.4 mg/dL (ref 8.9–10.3)
Chloride: 111 mmol/L (ref 98–111)
Creatinine: 0.63 mg/dL (ref 0.44–1.00)
GFR, Estimated: >60 mL/min (ref >60)
Glucose, Bld: 100 mg/dL — ABNORMAL HIGH (ref 70–99)
Potassium: 3.8 mmol/L (ref 3.5–5.1)
Sodium: 143 mmol/L (ref 135–145)
Total Bilirubin: 0.3 mg/dL (ref 0.0–1.2)
Total Protein: 7 g/dL (ref 6.5–8.1)

## 2023-12-16 LAB — CBC WITH DIFFERENTIAL (CANCER CENTER ONLY)
Abs Immature Granulocytes: 0.02 10*3/uL (ref 0.00–0.07)
Basophils Absolute: 0 10*3/uL (ref 0.0–0.1)
Basophils Relative: 0 %
Eosinophils Absolute: 0.1 10*3/uL (ref 0.0–0.5)
Eosinophils Relative: 2 %
HCT: 34.6 % — ABNORMAL LOW (ref 36.0–46.0)
Hemoglobin: 11.7 g/dL — ABNORMAL LOW (ref 12.0–15.0)
Immature Granulocytes: 1 %
Immature Platelet Fraction: 1.8 % (ref 1.2–8.6)
Lymphocytes Relative: 20 %
Lymphs Abs: 0.6 10*3/uL — ABNORMAL LOW (ref 0.7–4.0)
MCH: 31.4 pg (ref 26.0–34.0)
MCHC: 33.8 g/dL (ref 30.0–36.0)
MCV: 92.8 fL (ref 80.0–100.0)
Monocytes Absolute: 0.3 10*3/uL (ref 0.1–1.0)
Monocytes Relative: 10 %
Neutro Abs: 2.1 10*3/uL (ref 1.7–7.7)
Neutrophils Relative %: 67 %
Platelet Count: 112 10*3/uL — ABNORMAL LOW (ref 150–400)
RBC: 3.73 MIL/uL — ABNORMAL LOW (ref 3.87–5.11)
RDW: 14.1 % (ref 11.5–15.5)
WBC Count: 3.1 10*3/uL — ABNORMAL LOW (ref 4.0–10.5)
nRBC: 0 % (ref 0.0–0.2)
nRBC: 0 /100{WBCs}

## 2023-12-16 MED ORDER — ACETAMINOPHEN 325 MG PO TABS
650.0000 mg | ORAL_TABLET | Freq: Once | ORAL | Status: AC
Start: 2023-12-16 — End: 2023-12-16
  Administered 2023-12-16: 650 mg via ORAL
  Filled 2023-12-16: qty 2

## 2023-12-16 MED ORDER — SODIUM CHLORIDE 0.9% FLUSH
10.0000 mL | INTRAVENOUS | Status: DC | PRN
Start: 2023-12-16 — End: 2023-12-16
  Administered 2023-12-16: 10 mL

## 2023-12-16 MED ORDER — DIPHENHYDRAMINE HCL 25 MG PO CAPS
50.0000 mg | ORAL_CAPSULE | Freq: Once | ORAL | Status: AC
  Administered 2023-12-16: 50 mg via ORAL
  Filled 2023-12-16: qty 2

## 2023-12-16 MED ORDER — PALONOSETRON HCL INJECTION 0.25 MG/5ML
0.2500 mg | Freq: Once | INTRAVENOUS | Status: AC
  Administered 2023-12-16: 0.25 mg via INTRAVENOUS
  Filled 2023-12-16: qty 5

## 2023-12-16 MED ORDER — DEXAMETHASONE SODIUM PHOSPHATE 10 MG/ML IJ SOLN
10.0000 mg | Freq: Once | INTRAMUSCULAR | Status: AC
  Administered 2023-12-16: 10 mg via INTRAVENOUS
  Filled 2023-12-16: qty 1

## 2023-12-16 MED ORDER — DEXTROSE 5 % IV SOLN: Freq: Once | INTRAVENOUS | Status: AC

## 2023-12-16 MED ORDER — HEPARIN SOD (PORK) LOCK FLUSH 100 UNIT/ML IV SOLN
500.0000 [IU] | Freq: Once | INTRAVENOUS | Status: AC | PRN
  Administered 2023-12-16: 500 [IU]

## 2023-12-16 MED ORDER — FAM-TRASTUZUMAB DERUXTECAN-NXKI CHEMO 100 MG IV SOLR
3.6300 mg/kg | Freq: Once | INTRAVENOUS | Status: AC
  Administered 2023-12-16: 240 mg via INTRAVENOUS
  Filled 2023-12-16: qty 12

## 2023-12-16 NOTE — Patient Instructions (Signed)
Fam-Trastuzumab Deruxtecan Injection Qu es este medicamento? El FAM-TRASTUZUMAB DERUXTECAN trata algunos tipos de cncer. Acta bloqueando una protena que hace que las clulas cancerosas crezcan y se multipliquen. Esto ayuda a Neurosurgeon propagacin de las clulas cancerosas. Este medicamento puede ser utilizado para otros usos; si tiene alguna pregunta consulte con su proveedor de atencin mdica o con su farmacutico. MARCAS COMUNES: ENHERTU Qu le debo informar a mi profesional de la salud antes de tomar este medicamento? Necesitan saber si usted presenta alguno de los [employer] o situaciones: Enfermedad cardiaca Insuficiencia cardiaca Infeccin, especialmente infecciones virales, tales como varicela, fuegos labiales o herpes Enfermedad heptica Enfermedad pulmonar o respiratoria, tales como asma o EPOC Una reaccin alrgica o inusual al fam-trastuzumab deruxtecan, a otros medicamentos, alimentos, colorantes o conservantes Si est embarazada o buscando quedar embarazada Si est amamantando a un beb Cmo debo utilizar este medicamento? Este medicamento se inyecta en una vena. Su equipo de atencin lo [place] en un hospital o en un entorno clnico. Se le entregar una Gua del medicamento (MedGuide, su nombre en ingls) especial antes de cada tratamiento. Asegrese de leer esta informacin cada vez cuidadosamente. Hable con su equipo de atencin sobre el uso de este medicamento en nios. Puede requerir atencin especial. Sobredosis: Pngase en contacto inmediatamente con un centro toxicolgico o una sala de urgencia si usted cree que haya tomado demasiado medicamento.<br>ATENCIN: [employer] es solo para usted. No comparta este medicamento con nadie. Qu sucede si me olvido de una dosis? Es importante no olvidar ninguna dosis. Llame a su equipo de atencin si no puede asistir a una cita. Qu puede interactuar con este medicamento? No se anticipan  interacciones. Puede ser que esta lista no menciona todas las posibles interacciones. Informe a su profesional de [employer] de [employer] productos a base de hierbas, medicamentos de [place] o suplementos nutritivos que est tomando. Si usted fuma, consume bebidas alcohlicas o si utiliza drogas ilegales, indqueselo tambin a su profesional de [employer]. Algunas sustancias pueden interactuar con su medicamento. A qu debo estar atento al usar [employer]? Visite a su equipo de atencin para que revise su evolucin peridicamente. Informe a su equipo de atencin si los sntomas no comienzan a mejorar o si empeoran. Este medicamento puede aumentar su riesgo de contraer una infeccin. Llame para pedir consejo a su equipo de atencin si tiene fiebre, escalofros, dolor de garganta o cualquier otro sntoma de resfriado o gripe. No se trate usted mismo. Trate de no acercarse a personas que estn enfermas. Evite usar medicamentos que contengan aspirina, acetaminofeno, ibuprofeno, naproxeno o ketoprofeno, a menos que as lo indique su equipo de atencin. Estos medicamentos pueden ocultar la fiebre. Proceda con cuidado al cepillar sus dientes, o al usar hilo dental o palillos para los dientes, ya que podra contraer una infeccin o Geophysicist/field seismologist con mayor facilidad. Si recibe algn tratamiento dental, informe a su dentista que est [employer]. Este medicamento puede resecarle los ojos y provocar visin borrosa. Si [place] lentes de contacto, podra sentir ciertas molestias. Las gotas lubricantes para los ojos pueden ser tiles. Si el problema no desaparece o es grave, visite a su equipo de atencin. Hable con su equipo de atencin si podra estar en embarazo. Este medicamento puede causar defectos congnitos graves si se [place] durante el embarazo y por 7 meses despus de la ltima dosis. Si su pareja puede quedar [place], use un condn [employer] sexuales mientras est usando este medicamento  y  por 4 meses despus de la ltima dosis. No debe amamantar a un beb mientras [place] este medicamento y por 7 meses despus de la ltima dosis. Este medicamento podra causar infertilidad. Hable con su equipo de atencin si le preocupa su fertilidad. Qu efectos secundarios puedo tener al [employer] este medicamento? Efectos secundarios que debe informar a su equipo de atencin tan pronto como sea posible: Reacciones alrgicas: erupcin cutnea, comezn/picazn, urticaria, hinchazn de la cara, los labios, la lengua o la garganta Tos seca, falta de aire o problemas para respirar Infeccin: fiebre, escalofros, tos, dolor de garganta, heridas que no sanan, dolor o problemas para Geographical information systems officer, sensacin general de molestia o Sports administrator cardiaca: falta de aire, hinchazn de los tobillos, los pies o las [place], aumento de peso repentino, debilidad o fatiga inusuales [place] o moretones inusuales Efectos secundarios que generalmente no requieren atencin mdica (debe informarlos a su equipo de atencin si persisten o si son molestos): Estreimiento Diarrea Cada del cabello Dolor muscular Nuseas Vmito Puede ser que esta lista no menciona todos los posibles efectos secundarios. Comunquese a su mdico por asesoramiento mdico [employer]. Usted puede informar los efectos secundarios a la FDA por telfono al 1-800-FDA-1088. Dnde debo guardar mi medicina? Este medicamento se administra en hospitales o clnicas. No se guarda en su casa. ATENCIN: Este folleto es un resumen. Puede ser que no cubra toda la posible informacin. Si usted tiene preguntas acerca de esta medicina, consulte con su mdico, su farmacutico o su profesional de Radiographer, therapeutic.  2024 Elsevier/Gold Standard (2023-08-12 00:00:00)

## 2023-12-16 NOTE — Telephone Encounter (Signed)
12/16/23 Next appt scheduled and confirmed with patient.

## 2023-12-17 ENCOUNTER — Other Ambulatory Visit: Payer: Self-pay

## 2023-12-24 ENCOUNTER — Other Ambulatory Visit: Payer: Self-pay

## 2023-12-31 ENCOUNTER — Other Ambulatory Visit: Payer: Self-pay

## 2024-01-05 NOTE — Progress Notes (Deleted)
 [facility]  [address] [place],  [place]  [address] [phone]  Clinic Day:  01/05/2024  Referring physician: Leanna Sato, MD   HISTORY OF PRESENT ILLNESS:  The patient is a 73 y.o. 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  She also had a subcarinal lymph node for which scans have consistently shown a positive response to therapy. She comes in today to be evaluated before heading into her 45th cycle of Enhertu.  The patient claims to have tolerated her 44th cycle of treatment fairly well.  She denies having any new symptoms/findings which concern her for overt signs of disease progression.    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  The patient received 6 cycles of Taxotere/Herceptin/Perjeta before being switched to maintenance Perjeta/Herceptin, for which she took 73 cycles of treatment.  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  This all happened in the setting of a prolonged treatment break from TDM-1.  Since recurrence was found, the patient has been on Enhertu since May 2022.   PHYSICAL EXAM:  There were no vitals taken for this visit. Wt Readings from Last 3 Encounters:  12/16/23 148 lb 11.2 oz (67.4 kg)  11/18/23 147 lb 11.2 oz (67 kg)  10/10/23 147 lb 12.8 oz (67 kg)   There is no height or weight on file to calculate BMI. Performance status (ECOG): 1 - Symptomatic but completely ambulatory Physical Exam Constitutional:      Appearance: Normal appearance. She is not ill-appearing.  HENT:      Mouth/Throat:     Mouth: Mucous membranes are moist.     Pharynx: Oropharynx is clear. No oropharyngeal exudate or posterior oropharyngeal erythema.  Cardiovascular:     Rate and Rhythm: Normal rate and regular rhythm.     Heart sounds: No murmur heard.    No friction rub. No gallop.  Pulmonary:     Effort: Pulmonary effort is normal. No respiratory distress.     Breath sounds: Normal breath sounds. No wheezing, rhonchi or rales.  Abdominal:     General: Bowel sounds are normal. There is no distension.     Palpations: Abdomen is soft. There is no mass.     Tenderness: There is no abdominal tenderness.  Musculoskeletal:        General: No swelling.     Right lower leg: No edema.     Left lower leg: No edema.  Lymphadenopathy:     Cervical: No cervical adenopathy.     Upper Body:     Right upper body: No supraclavicular or axillary adenopathy.     Left upper body: No supraclavicular or axillary adenopathy.     Lower Body: No right inguinal adenopathy. No left inguinal adenopathy.  Skin:    General: Skin is warm.     Coloration: Skin is not jaundiced.     Findings: No lesion or rash.  Neurological:     General: No focal deficit present.     Mental Status: She is alert and oriented to person, place, and time. Mental status is at  baseline.  Psychiatric:        Mood and Affect: Mood normal.        Behavior: Behavior normal.        Thought Content: Thought content normal.   LABS:      Latest Ref Rng & Units 12/16/2023    1:10 PM 11/18/2023    1:13 PM 10/10/2023    1:50 PM  CBC  WBC 4.0 - 10.5 K/uL 3.1  4.5  2.0   Hemoglobin 12.0 - 15.0 g/dL 16.1  09.6  04.5   Hematocrit 36.0 - 46.0 % 34.6  36.3  34.6   Platelets 150 - 400 K/uL 112  133  118       Latest Ref Rng & Units 12/16/2023    1:10 PM 11/18/2023    1:13 PM 10/10/2023    1:50 PM  CMP  Glucose 70 - 99 mg/dL 409  811  914   BUN 8 - 23 mg/dL 14  10  14    Creatinine 0.44 - 1.00 mg/dL 7.82  9.56  2.13   Sodium 135 - 145  mmol/L 143  140  142   Potassium 3.5 - 5.1 mmol/L 3.8  3.9  3.8   Chloride 98 - 111 mmol/L 111  106  108   CO2 22 - 32 mmol/L 23  25  23    Calcium 8.9 - 10.3 mg/dL 9.4  9.4  9.3   Total Protein 6.5 - 8.1 g/dL 7.0  7.5  7.2   Total Bilirubin 0.0 - 1.2 mg/dL 0.3  0.4  0.4   Alkaline Phos 38 - 126 U/L 114  131  140   AST 15 - 41 U/L 37  41  46   ALT 0 - 44 U/L 25  31  35    ASSESSMENT & PLAN:  Assessment/Plan:  A 73 y.o. 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  She will proceed with her 44th cycle of Enhertu today.  Overall, she appears to be doing very well.  I will see her back in 3 weeks before she heads into her 45th cycle of treatment.  The patient understands all the plans discussed today and is in agreement with them.     Lilith Solana Kirby Funk, MD

## 2024-01-06 ENCOUNTER — Inpatient Hospital Stay: Payer: Self-pay

## 2024-01-06 ENCOUNTER — Other Ambulatory Visit: Payer: Self-pay | Admitting: Oncology

## 2024-01-06 ENCOUNTER — Inpatient Hospital Stay: Payer: Self-pay | Admitting: Oncology

## 2024-01-06 ENCOUNTER — Inpatient Hospital Stay (HOSPITAL_BASED_OUTPATIENT_CLINIC_OR_DEPARTMENT_OTHER): Payer: Self-pay | Attending: Oncology | Admitting: Oncology

## 2024-01-06 ENCOUNTER — Telehealth: Payer: Self-pay | Admitting: Oncology

## 2024-01-06 ENCOUNTER — Inpatient Hospital Stay: Payer: Self-pay | Attending: Oncology

## 2024-01-06 VITALS — BP 119/82 | HR 69 | Temp 97.7°F | Resp 16 | Ht 61.0 in | Wt 150.2 lb

## 2024-01-06 DIAGNOSIS — Z1731 Human epidermal growth factor receptor 2 positive status: Secondary | ICD-10-CM | POA: Insufficient documentation

## 2024-01-06 DIAGNOSIS — Z853 Personal history of malignant neoplasm of breast: Secondary | ICD-10-CM | POA: Insufficient documentation

## 2024-01-06 DIAGNOSIS — Z5112 Encounter for antineoplastic immunotherapy: Secondary | ICD-10-CM | POA: Insufficient documentation

## 2024-01-06 DIAGNOSIS — C50412 Malignant neoplasm of upper-outer quadrant of left female breast: Secondary | ICD-10-CM

## 2024-01-06 DIAGNOSIS — Z171 Estrogen receptor negative status [ER-]: Secondary | ICD-10-CM

## 2024-01-06 DIAGNOSIS — C7931 Secondary malignant neoplasm of brain: Secondary | ICD-10-CM | POA: Insufficient documentation

## 2024-01-06 LAB — CBC WITH DIFFERENTIAL (CANCER CENTER ONLY)
Abs Immature Granulocytes: 0.01 10*3/uL (ref 0.00–0.07)
Basophils Absolute: 0 10*3/uL (ref 0.0–0.1)
Basophils Relative: 0 %
Eosinophils Absolute: 0.1 10*3/uL (ref 0.0–0.5)
Eosinophils Relative: 3 %
HCT: 33.8 % — ABNORMAL LOW (ref 36.0–46.0)
Hemoglobin: 11.6 g/dL — ABNORMAL LOW (ref 12.0–15.0)
Immature Granulocytes: 0 %
Lymphocytes Relative: 20 %
Lymphs Abs: 0.5 10*3/uL — ABNORMAL LOW (ref 0.7–4.0)
MCH: 32 pg (ref 26.0–34.0)
MCHC: 34.3 g/dL (ref 30.0–36.0)
MCV: 93.4 fL (ref 80.0–100.0)
Monocytes Absolute: 0.3 10*3/uL (ref 0.1–1.0)
Monocytes Relative: 10 %
Neutro Abs: 1.6 10*3/uL — ABNORMAL LOW (ref 1.7–7.7)
Neutrophils Relative %: 67 %
Platelet Count: 124 10*3/uL — ABNORMAL LOW (ref 150–400)
RBC: 3.62 MIL/uL — ABNORMAL LOW (ref 3.87–5.11)
RDW: 14.7 % (ref 11.5–15.5)
WBC Count: 2.5 10*3/uL — ABNORMAL LOW (ref 4.0–10.5)
nRBC: 0 % (ref 0.0–0.2)
nRBC: 0 /100{WBCs}

## 2024-01-06 LAB — CMP (CANCER CENTER ONLY)
ALT: 27 U/L (ref 0–44)
AST: 37 U/L (ref 15–41)
Albumin: 3.5 g/dL (ref 3.5–5.0)
Alkaline Phosphatase: 112 U/L (ref 38–126)
Anion gap: 8 (ref 5–15)
BUN: 10 mg/dL (ref 8–23)
CO2: 25 mmol/L (ref 22–32)
Calcium: 9.5 mg/dL (ref 8.9–10.3)
Chloride: 108 mmol/L (ref 98–111)
Creatinine: 0.64 mg/dL (ref 0.44–1.00)
GFR, Estimated: >60 mL/min (ref >60)
Glucose, Bld: 92 mg/dL (ref 70–99)
Potassium: 3.8 mmol/L (ref 3.5–5.1)
Sodium: 141 mmol/L (ref 135–145)
Total Bilirubin: 0.3 mg/dL (ref 0.0–1.2)
Total Protein: 6.9 g/dL (ref 6.5–8.1)

## 2024-01-06 MED ORDER — HEPARIN SOD (PORK) LOCK FLUSH 100 UNIT/ML IV SOLN
500.0000 [IU] | Freq: Once | INTRAVENOUS | Status: AC | PRN
  Administered 2024-01-06: 500 [IU]

## 2024-01-06 MED ORDER — PALONOSETRON HCL INJECTION 0.25 MG/5ML
0.2500 mg | Freq: Once | INTRAVENOUS | Status: AC
Start: 2024-01-06 — End: 2024-01-06
  Administered 2024-01-06: 0.25 mg via INTRAVENOUS
  Filled 2024-01-06: qty 5

## 2024-01-06 MED ORDER — DEXAMETHASONE SODIUM PHOSPHATE 10 MG/ML IJ SOLN
10.0000 mg | Freq: Once | INTRAMUSCULAR | Status: AC
  Administered 2024-01-06: 10 mg via INTRAVENOUS
  Filled 2024-01-06: qty 1

## 2024-01-06 MED ORDER — ACETAMINOPHEN 325 MG PO TABS
650.0000 mg | ORAL_TABLET | Freq: Once | ORAL | Status: AC
Start: 2024-01-06 — End: 2024-01-06
  Administered 2024-01-06: 650 mg via ORAL
  Filled 2024-01-06: qty 2

## 2024-01-06 MED ORDER — DEXTROSE 5 % IV SOLN: Freq: Once | INTRAVENOUS | Status: AC

## 2024-01-06 MED ORDER — DIPHENHYDRAMINE HCL 25 MG PO CAPS
50.0000 mg | ORAL_CAPSULE | Freq: Once | ORAL | Status: AC
  Administered 2024-01-06: 50 mg via ORAL
  Filled 2024-01-06: qty 2

## 2024-01-06 MED ORDER — FAM-TRASTUZUMAB DERUXTECAN-NXKI CHEMO 100 MG IV SOLR
3.6300 mg/kg | Freq: Once | INTRAVENOUS | Status: AC
  Administered 2024-01-06: 240 mg via INTRAVENOUS
  Filled 2024-01-06: qty 12

## 2024-01-06 MED ORDER — SODIUM CHLORIDE 0.9% FLUSH
10.0000 mL | INTRAVENOUS | Status: DC | PRN
  Administered 2024-01-06: 10 mL

## 2024-01-06 NOTE — Telephone Encounter (Signed)
 01/06/24 Spoke with patient and confirmed next appt.

## 2024-01-06 NOTE — Progress Notes (Signed)
 Dr Melvyn Neth, Mylo Red, Retia Passe and scheduling notified via quick chat that pt's last echo was done on 10/09/23 which is good for today's treatment but she will need another one scheduled prior to next treatment.

## 2024-01-06 NOTE — Patient Instructions (Signed)
 Fam-Trastuzumab Deruxtecan Injection Qu es este medicamento? El FAM-TRASTUZUMAB DERUXTECAN trata algunos tipos de cncer. Acta bloqueando una protena que hace que las clulas cancerosas crezcan y se multipliquen. Esto ayuda a Neurosurgeon propagacin de las clulas cancerosas. Este medicamento puede ser utilizado para otros usos; si tiene alguna pregunta consulte con su proveedor de atencin mdica o con su farmacutico. MARCAS COMUNES: ENHERTU Qu le debo informar a mi profesional de la salud antes de tomar este medicamento? Necesitan saber si usted presenta alguno de los [employer] o situaciones: Enfermedad cardiaca Insuficiencia cardiaca Infeccin, especialmente infecciones virales, tales como varicela, fuegos labiales o herpes Enfermedad heptica Enfermedad pulmonar o respiratoria, tales como asma o EPOC Una reaccin alrgica o inusual al fam-trastuzumab deruxtecan, a otros medicamentos, alimentos, colorantes o conservantes Si est embarazada o buscando quedar embarazada Si est amamantando a un beb Cmo debo utilizar este medicamento? Este medicamento se inyecta en una vena. Su equipo de atencin lo [place] en un hospital o en un entorno clnico. Se le entregar una Gua del medicamento (MedGuide, su nombre en ingls) especial antes de cada tratamiento. Asegrese de leer esta informacin cada vez cuidadosamente. Hable con su equipo de atencin sobre el uso de este medicamento en nios. Puede requerir atencin especial. Sobredosis: Pngase en contacto inmediatamente con un centro toxicolgico o una sala de urgencia si usted cree que haya tomado demasiado medicamento.<br>ATENCIN: [employer] es solo para usted. No comparta este medicamento con nadie. Qu sucede si me olvido de una dosis? Es importante no olvidar ninguna dosis. Llame a su equipo de atencin si no puede asistir a una cita. Qu puede interactuar con este medicamento? No se anticipan  interacciones. Puede ser que esta lista no menciona todas las posibles interacciones. Informe a su profesional de [employer] de [employer] productos a base de hierbas, medicamentos de [place] o suplementos nutritivos que est tomando. Si usted fuma, consume bebidas alcohlicas o si utiliza drogas ilegales, indqueselo tambin a su profesional de [employer]. Algunas sustancias pueden interactuar con su medicamento. A qu debo estar atento al usar [employer]? Visite a su equipo de atencin para que revise su evolucin peridicamente. Informe a su equipo de atencin si los sntomas no comienzan a mejorar o si empeoran. Este medicamento puede aumentar su riesgo de contraer una infeccin. Llame para pedir consejo a su equipo de atencin si tiene fiebre, escalofros, dolor de garganta o cualquier otro sntoma de resfriado o gripe. No se trate usted mismo. Trate de no acercarse a personas que estn enfermas. Evite usar medicamentos que contengan aspirina, acetaminofeno, ibuprofeno, naproxeno o ketoprofeno, a menos que as lo indique su equipo de atencin. Estos medicamentos pueden ocultar la fiebre. Proceda con cuidado al cepillar sus dientes, o al usar hilo dental o palillos para los dientes, ya que podra contraer una infeccin o Geophysicist/field seismologist con mayor facilidad. Si recibe algn tratamiento dental, informe a su dentista que est [employer]. Este medicamento puede resecarle los ojos y provocar visin borrosa. Si [place] lentes de contacto, podra sentir ciertas molestias. Las gotas lubricantes para los ojos pueden ser tiles. Si el problema no desaparece o es grave, visite a su equipo de atencin. Hable con su equipo de atencin si podra estar en embarazo. Este medicamento puede causar defectos congnitos graves si se [place] durante el embarazo y por 7 meses despus de la ltima dosis. Si su pareja puede quedar Zimm[place], use un condn [employer] [place] [employer] sexuales mientras est usando este medicamento  y  por 4 meses despus de la ltima dosis. No debe amamantar a un beb mientras [place] este medicamento y por 7 meses despus de la ltima dosis. Este medicamento podra causar infertilidad. Hable con su equipo de atencin si le preocupa su fertilidad. Qu efectos secundarios puedo tener al [employer] este medicamento? Efectos secundarios que debe informar a su equipo de atencin tan pronto como sea posible: Reacciones alrgicas: erupcin cutnea, comezn/picazn, urticaria, hinchazn de la cara, los labios, la lengua o la garganta Tos seca, falta de aire o problemas para respirar Infeccin: fiebre, escalofros, tos, dolor de garganta, heridas que no sanan, dolor o problemas para Geographical information systems officer, sensacin general de molestia o Sports administrator cardiaca: falta de aire, hinchazn de los tobillos, los pies o las [place], aumento de peso repentino, debilidad o fatiga inusuales [place] o moretones inusuales Efectos secundarios que generalmente no requieren atencin mdica (debe informarlos a su equipo de atencin si persisten o si son molestos): Estreimiento Diarrea Cada del cabello Dolor muscular Nuseas Vmito Puede ser que esta lista no menciona todos los posibles efectos secundarios. Comunquese a su mdico por asesoramiento mdico [employer]. Usted puede informar los efectos secundarios a la FDA por telfono al 1-800-FDA-1088. Dnde debo guardar mi medicina? Este medicamento se administra en hospitales o clnicas. No se guarda en su casa. ATENCIN: Este folleto es un resumen. Puede ser que no cubra toda la posible informacin. Si usted tiene preguntas acerca de esta medicina, consulte con su mdico, su farmacutico o su profesional de Radiographer, therapeutic.  2024 Elsevier/Gold Standard (2023-08-12 00:00:00)

## 2024-01-06 NOTE — Progress Notes (Signed)
 [facility]  [address] [place],  [place]  [address] [phone]  Clinic Day:  01/06/2024  Referring physician: Leanna Sato, MD   HISTORY OF PRESENT ILLNESS:  The patient is a 73 y.o. 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  She also had a subcarinal lymph node for which scans have consistently shown a positive response to therapy. She comes in today to be evaluated before heading into her 45th cycle of Enhertu.  The patient claims to have tolerated her 44th cycle of treatment fairly well.  However, she has begun to notice left arm swelling over the past few weeks.  She denies having other symptoms/findings which concern her for overt signs of disease progression.    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  The patient received 6 cycles of Taxotere/Herceptin/Perjeta before being switched to maintenance Perjeta/Herceptin, for which she took 73 cycles of treatment.  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  This all happened in the setting of a prolonged treatment break from TDM-1.  Since recurrence was found, the patient has been on Enhertu since May 2022.   PHYSICAL EXAM:  Blood pressure 119/82, pulse 69, temperature 97.7 F (36.5 C), temperature source Oral, resp. rate 16, height 5\' 1"  (1.549 m), weight 150 lb 3.2 oz (68.1 kg), SpO2 95%. Wt Readings from Last 3 Encounters:  01/06/24 150 lb 3.2 oz (68.1 kg)  12/16/23 148 lb 11.2 oz (67.4 kg)  11/18/23 147 lb 11.2 oz (67 kg)   Body mass index is 28.38  kg/m. Performance status (ECOG): 1 - Symptomatic but completely ambulatory Physical Exam Constitutional:      Appearance: Normal appearance. She is not ill-appearing.  HENT:     Mouth/Throat:     Mouth: Mucous membranes are moist.     Pharynx: Oropharynx is clear. No oropharyngeal exudate or posterior oropharyngeal erythema.  Cardiovascular:     Rate and Rhythm: Normal rate and regular rhythm.     Heart sounds: No murmur heard.    No friction rub. No gallop.  Pulmonary:     Effort: Pulmonary effort is normal. No respiratory distress.     Breath sounds: Normal breath sounds. No wheezing, rhonchi or rales.  Abdominal:     General: Bowel sounds are normal. There is no distension.     Palpations: Abdomen is soft. There is no mass.     Tenderness: There is no abdominal tenderness.  Musculoskeletal:        General: No swelling.     Left upper arm: Swelling present.     Left forearm: Swelling present.     Right lower leg: No edema.     Left lower leg: No edema.  Lymphadenopathy:     Cervical: No cervical adenopathy.     Upper Body:     Right upper body: No supraclavicular or axillary adenopathy.     Left upper body: No supraclavicular or axillary adenopathy.     Lower Body: No right inguinal adenopathy. No left inguinal adenopathy.  Skin:    General: Skin is warm.  Coloration: Skin is not jaundiced.     Findings: No lesion or rash.  Neurological:     General: No focal deficit present.     Mental Status: She is alert and oriented to person, place, and time. Mental status is at baseline.  Psychiatric:        Mood and Affect: Mood normal.        Behavior: Behavior normal.        Thought Content: Thought content normal.   He LABS:      Latest Ref Rng & Units 01/06/2024    1:04 PM 12/16/2023    1:10 PM 11/18/2023    1:13 PM  CBC  WBC 4.0 - 10.5 K/uL 2.5  3.1  4.5   Hemoglobin 12.0 - 15.0 g/dL 32.4  40.1  02.7   Hematocrit 36.0 - 46.0 % 33.8  34.6  36.3   Platelets 150 -  400 K/uL 124  112  133       Latest Ref Rng & Units 01/06/2024    1:04 PM 12/16/2023    1:10 PM 11/18/2023    1:13 PM  CMP  Glucose 70 - 99 mg/dL 92  253  664   BUN 8 - 23 mg/dL 10  14  10    Creatinine 0.44 - 1.00 mg/dL 4.03  4.74  2.59   Sodium 135 - 145 mmol/L 141  143  140   Potassium 3.5 - 5.1 mmol/L 3.8  3.8  3.9   Chloride 98 - 111 mmol/L 108  111  106   CO2 22 - 32 mmol/L 25  23  25    Calcium 8.9 - 10.3 mg/dL 9.5  9.4  9.4   Total Protein 6.5 - 8.1 g/dL 6.9  7.0  7.5   Total Bilirubin 0.0 - 1.2 mg/dL 0.3  0.3  0.4   Alkaline Phos 38 - 126 U/L 112  114  131   AST 15 - 41 U/L 37  37  41   ALT 0 - 44 U/L 27  25  31     ASSESSMENT & PLAN:  Assessment/Plan:  A 73 y.o. 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  She will proceed with her 45th cycle of Enhertu today.  Per her physical exam today, she does have lymphedema in her left arm.  Of note, her surgery from 10 years ago involved her undergoing a complete left axillary lymph node dissection.  I did not appreciate any lymphadenopathy in either her axillary or supraclavicular regions.  Overall, she appears to be doing very well.  I will see her back in 3 weeks before she heads into her 46th cycle of treatment.  Repeat CT scans of her chest/abdomen/pelvis will be done a day before her next visit to ascertain her new disease baseline after 45 cycles of Enhertu.  If her upcoming CT scan shows no obvious etiology behind her lymphedema, I will likely have her see a lymphedema specialist in the near future to help bring down the swelling in this extremity.  The patient understands all the plans discussed today and is in agreement with them.     Emily Massar Kirby Funk, MD

## 2024-01-07 ENCOUNTER — Other Ambulatory Visit: Payer: Self-pay | Admitting: Oncology

## 2024-01-07 ENCOUNTER — Other Ambulatory Visit: Payer: Self-pay

## 2024-01-07 DIAGNOSIS — C50412 Malignant neoplasm of upper-outer quadrant of left female breast: Secondary | ICD-10-CM

## 2024-01-09 ENCOUNTER — Telehealth: Payer: Self-pay

## 2024-01-09 NOTE — Telephone Encounter (Signed)
 [facility] Clinical Social Work  Clinical Social Work was referred by  [employer]   for assessment of psychosocial needs Engineer, site).  Clinical Social Worker attempted to contact patient by phone x2 with interpreter [phone] to offer support and assess for needs. CSW was unable to reach patient, left vm with direct contact.  CSW will defer until second attempt.   Marguerita Merles, LCSW  Clinical Social Worker [facility]

## 2024-01-13 ENCOUNTER — Telehealth: Payer: Self-pay

## 2024-01-13 NOTE — Telephone Encounter (Signed)
 [facility] Clinical Social Work  Clinical Social Work was referred by  [employer]  for assessment of psychosocial needs.  Clinical Social Worker contacted caregiver by phone with interpreter [phone] to offer support and assess for needs. CSW discussed ways to apply for application. CSW sent application to patient's daughter via email, as requested by daughter. CSW offered to schedule appointment or receive application via email. Patient's daughter will review and keep CSW updated.    Marguerita Merles, LCSW  Clinical Social Worker [facility]

## 2024-01-22 ENCOUNTER — Ambulatory Visit: Payer: Self-pay | Attending: Oncology

## 2024-01-22 DIAGNOSIS — C50412 Malignant neoplasm of upper-outer quadrant of left female breast: Secondary | ICD-10-CM

## 2024-01-22 DIAGNOSIS — Z171 Estrogen receptor negative status [ER-]: Secondary | ICD-10-CM

## 2024-01-22 DIAGNOSIS — Z0189 Encounter for other specified special examinations: Secondary | ICD-10-CM

## 2024-01-22 LAB — ECHOCARDIOGRAM COMPLETE
P 1/2 time: 739 ms
S' Lateral: 2.7 cm

## 2024-01-23 ENCOUNTER — Ambulatory Visit (HOSPITAL_BASED_OUTPATIENT_CLINIC_OR_DEPARTMENT_OTHER)
Admission: RE | Admit: 2024-01-23 | Discharge: 2024-01-23 | Disposition: A | Discharge status: Home / Self Care | Payer: Self-pay | Source: Ambulatory Visit | Attending: Oncology | Admitting: Oncology

## 2024-01-23 DIAGNOSIS — Z171 Estrogen receptor negative status [ER-]: Secondary | ICD-10-CM

## 2024-01-23 DIAGNOSIS — C50412 Malignant neoplasm of upper-outer quadrant of left female breast: Secondary | ICD-10-CM

## 2024-01-23 MED ORDER — IOHEXOL 300 MG/ML  SOLN
100.0000 mL | Freq: Once | INTRAMUSCULAR | Status: AC | PRN
  Administered 2024-01-23: 100 mL via INTRAVENOUS

## 2024-01-23 MED ORDER — HEPARIN SOD (PORK) LOCK FLUSH 100 UNIT/ML IV SOLN
500.0000 [IU] | Freq: Once | INTRAVENOUS | Status: AC
  Administered 2024-01-23: 500 [IU] via INTRAVENOUS

## 2024-01-27 ENCOUNTER — Inpatient Hospital Stay (HOSPITAL_BASED_OUTPATIENT_CLINIC_OR_DEPARTMENT_OTHER): Payer: Self-pay | Admitting: Oncology

## 2024-01-27 ENCOUNTER — Inpatient Hospital Stay: Payer: Self-pay

## 2024-01-27 ENCOUNTER — Telehealth: Payer: Self-pay | Admitting: Oncology

## 2024-01-27 ENCOUNTER — Other Ambulatory Visit: Payer: Self-pay

## 2024-01-27 ENCOUNTER — Encounter: Payer: Self-pay | Admitting: Oncology

## 2024-01-27 VITALS — BP 141/71 | HR 63 | Temp 97.6°F | Resp 14 | Ht 61.0 in | Wt 150.5 lb

## 2024-01-27 DIAGNOSIS — C50412 Malignant neoplasm of upper-outer quadrant of left female breast: Secondary | ICD-10-CM

## 2024-01-27 DIAGNOSIS — Z171 Estrogen receptor negative status [ER-]: Secondary | ICD-10-CM

## 2024-01-27 LAB — CBC WITH DIFFERENTIAL (CANCER CENTER ONLY)
Abs Immature Granulocytes: 0.02 10*3/uL (ref 0.00–0.07)
Basophils Absolute: 0 10*3/uL (ref 0.0–0.1)
Basophils Relative: 0 %
Eosinophils Absolute: 0.1 10*3/uL (ref 0.0–0.5)
Eosinophils Relative: 4 %
HCT: 33.8 % — ABNORMAL LOW (ref 36.0–46.0)
Hemoglobin: 11.4 g/dL — ABNORMAL LOW (ref 12.0–15.0)
Immature Granulocytes: 1 %
Immature Platelet Fraction: 1.7 % (ref 1.2–8.6)
Lymphocytes Relative: 26 %
Lymphs Abs: 0.5 10*3/uL — ABNORMAL LOW (ref 0.7–4.0)
MCH: 31.6 pg (ref 26.0–34.0)
MCHC: 33.7 g/dL (ref 30.0–36.0)
MCV: 93.6 fL (ref 80.0–100.0)
Monocytes Absolute: 0.2 10*3/uL (ref 0.1–1.0)
Monocytes Relative: 11 %
Neutro Abs: 1.3 10*3/uL — ABNORMAL LOW (ref 1.7–7.7)
Neutrophils Relative %: 58 %
Platelet Count: 133 10*3/uL — ABNORMAL LOW (ref 150–400)
RBC: 3.61 MIL/uL — ABNORMAL LOW (ref 3.87–5.11)
RDW: 15.3 % (ref 11.5–15.5)
WBC Count: 2.1 10*3/uL — ABNORMAL LOW (ref 4.0–10.5)
nRBC: 0 % (ref 0.0–0.2)
nRBC: 0 /100{WBCs}

## 2024-01-27 LAB — CMP (CANCER CENTER ONLY)
ALT: 30 U/L (ref 0–44)
AST: 47 U/L — ABNORMAL HIGH (ref 15–41)
Albumin: 3.8 g/dL (ref 3.5–5.0)
Alkaline Phosphatase: 113 U/L (ref 38–126)
Anion gap: 10 (ref 5–15)
BUN: 13 mg/dL (ref 8–23)
CO2: 24 mmol/L (ref 22–32)
Calcium: 9.5 mg/dL (ref 8.9–10.3)
Chloride: 106 mmol/L (ref 98–111)
Creatinine: 0.62 mg/dL (ref 0.44–1.00)
GFR, Estimated: >60 mL/min (ref >60)
Glucose, Bld: 125 mg/dL — ABNORMAL HIGH (ref 70–99)
Potassium: 3.8 mmol/L (ref 3.5–5.1)
Sodium: 141 mmol/L (ref 135–145)
Total Bilirubin: 0.4 mg/dL (ref 0.0–1.2)
Total Protein: 7.1 g/dL (ref 6.5–8.1)

## 2024-01-27 MED ORDER — DIPHENHYDRAMINE HCL 25 MG PO CAPS
50.0000 mg | ORAL_CAPSULE | Freq: Once | ORAL | Status: AC
  Administered 2024-01-27: 50 mg via ORAL
  Filled 2024-01-27: qty 2

## 2024-01-27 MED ORDER — SODIUM CHLORIDE 0.9% FLUSH
10.0000 mL | INTRAVENOUS | Status: DC | PRN
  Administered 2024-01-27: 10 mL

## 2024-01-27 MED ORDER — ACETAMINOPHEN 325 MG PO TABS
650.0000 mg | ORAL_TABLET | Freq: Once | ORAL | Status: AC
  Administered 2024-01-27: 650 mg via ORAL
  Filled 2024-01-27: qty 2

## 2024-01-27 MED ORDER — HEPARIN SOD (PORK) LOCK FLUSH 100 UNIT/ML IV SOLN
500.0000 [IU] | Freq: Once | INTRAVENOUS | Status: AC | PRN
Start: 2024-01-27 — End: 2024-01-27
  Administered 2024-01-27: 500 [IU]

## 2024-01-27 MED ORDER — PALONOSETRON HCL INJECTION 0.25 MG/5ML
0.2500 mg | Freq: Once | INTRAVENOUS | Status: AC
  Administered 2024-01-27: 0.25 mg via INTRAVENOUS
  Filled 2024-01-27: qty 5

## 2024-01-27 MED ORDER — DEXTROSE 5 % IV SOLN
Freq: Once | INTRAVENOUS | Status: AC
Start: 2024-01-27 — End: 2024-01-27

## 2024-01-27 MED ORDER — FAM-TRASTUZUMAB DERUXTECAN-NXKI CHEMO 100 MG IV SOLR
3.6300 mg/kg | Freq: Once | INTRAVENOUS | Status: AC
  Administered 2024-01-27: 240 mg via INTRAVENOUS
  Filled 2024-01-27: qty 12

## 2024-01-27 MED ORDER — DEXAMETHASONE SODIUM PHOSPHATE 10 MG/ML IJ SOLN
10.0000 mg | Freq: Once | INTRAMUSCULAR | Status: AC
  Administered 2024-01-27: 10 mg via INTRAVENOUS
  Filled 2024-01-27: qty 1

## 2024-01-27 NOTE — Patient Instructions (Signed)
 Instrucciones al darle de alta: Discharge Instructions Gracias por elegir al [facility] de Cncer de [place] para brindarle atencin mdica de oncologa y Teacher, English as a foreign language.   Si usted tiene una cita de laboratorio con [employer] de [place], por favor vaya directamente al [employer] de Cncer y regstrese en el rea de Engineer, maintenance (IT).   Use ropa cmoda y [place] para tener fcil acceso a las vas del Portacath (acceso venoso de Set designer duracin) o la lnea PICC (catter central colocado por va perifrica).   Nos esforzamos por ofrecerle tiempo de calidad con su proveedor. Es posible que tenga que volver a programar su cita si llega tarde (15 minutos o ms).  El llegar tarde le afecta a usted y a otros pacientes cuyas citas son posteriores a Armed forces operational officer.  Adems, si usted falta a tres o ms citas sin avisar a la oficina, puede ser retirado(a) de la clnica a discrecin del proveedor.      Para las solicitudes de renovacin de recetas, pida a su farmacia que se ponga en contacto con nuestra oficina y deje que transcurran 72 horas para que se complete el proceso de las renovaciones.    Hoy usted recibi los siguientes agentes de quimioterapia e/o inmunoterapia Enhertu      Para ayudar a prevenir las nuseas y los vmitos despus de su tratamiento, le recomendamos que tome su medicamento para las nuseas segn las indicaciones.  LOS SNTOMAS QUE DEBEN COMUNICARSE INMEDIATAMENTE SE INDICAN A CONTINUACIN: *FIEBRE SUPERIOR A 100.4 F (38 C) O MS *ESCALOFROS O SUDORACIN *NUSEAS Y VMITOS QUE NO SE CONTROLAN CON EL MEDICAMENTO PARA LAS NUSEAS *DIFICULTAD INUSUAL PARA RESPIRAR  *MORETONES O HEMORRAGIAS NO HABITUALES *PROBLEMAS URINARIOS (dolor o ardor al Geographical information systems officer o frecuencia para Geographical information systems officer) *PROBLEMAS INTESTINALES (diarrea inusual, estreimiento, dolor cerca del ano) SENSIBILIDAD EN LA BOCA Y EN LA GARGANTA CON O SIN LA PRESENCIA DE LCERAS (dolor de garganta, llagas en la boca o dolor de muelas/dientes) ERUPCIN,  HINCHAZN O DOLORES INUSUALES FLUJO VAGINAL INUSUAL O PICAZN/RASQUIA    Los puntos marcados con un asterisco ( *) indican una posible emergencia y debe hacer un seguimiento tan pronto como le sea posible o vaya al Departamento de Emergencias si se le presenta algn problema.  Por favor, muestre la [place] DE ADVERTENCIA DE Marc Morgans DE ADVERTENCIA DE Gardiner Fanti al registrarse en [address] de Emergencias y a la enfermera de triaje.  Si tiene preguntas despus de su visita o necesita cancelar o volver a programar su cita, por favor pngase en contacto con [facility] OF [facility]  Dept: [phone]  y siga las instrucciones. Las horas de oficina son de 8:00 a.m. a 4:30 p.m. de lunes a viernes. Por favor, tenga en cuenta que los mensajes de voz que se dejan despus de las 4:00 p.m. posiblemente no se devolvern hasta el siguiente da de [place].  Cerramos los fines de semana y [employer]. En todo momento tiene acceso a una enfermera para preguntas urgentes. Por favor, llame al nmero principal de la clnica Dept: [phone] y siga las instrucciones.   Para cualquier pregunta que no sea de carcter urgente, tambin puede ponerse en contacto con su proveedor [employer]. Ahora ofrecemos visitas electrnicas para cualquier persona mayor de 18 aos que solicite atencin mdica en lnea para los sntomas que no sean urgentes. Para ms detalles vaya a [email or web address].   Tambin puede bajar la aplicacin de MyChart! Vaya a la tienda  de aplicaciones, busque "MyChart", abra la aplicacin, seleccione [place], e ingrese con su nombre de usuario y la contrasea de Clinical cytogeneticist.

## 2024-01-27 NOTE — Telephone Encounter (Signed)
 01/27/24 Spoke with patient and confirmed next appt.

## 2024-01-27 NOTE — Progress Notes (Signed)
 Per Dr. Melvyn Neth ok to proceed with D1C46 Enhertu with ANC 1.3

## 2024-01-27 NOTE — Progress Notes (Signed)
 [facility]  [address] [place],  [place]  [address] [phone]  Clinic Day:  01/27/2024  Referring physician: Leanna Sato, MD   HISTORY OF PRESENT ILLNESS:  The patient is a 73 y.o. 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  She also had a subcarinal lymph node for which scans have consistently shown a positive response to therapy. She comes in today to go over her CT scans to ascertain her new disease baseline after receiving 45 cycles of Enhertu.  The patient claims to have tolerated her 45th cycle of treatment fairly well.  She has still noticed left arm swelling over the past few weeks, for which she wears an elastic sleeve.  Otherwise, she denies having any symptoms/findings which concern her for overt signs of disease progression.    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  The patient received 6 cycles of Taxotere/Herceptin/Perjeta before being switched to maintenance Perjeta/Herceptin, for which she took 73 cycles of treatment.  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  This all happened in the setting of a prolonged treatment break from TDM-1.  Since recurrence was found, the patient has been on Enhertu since May 2022.   PHYSICAL EXAM:  Blood pressure (!) 141/71, pulse 63, temperature 97.6 F (36.4 C), temperature source Oral, resp. rate 14, height 5\' 1"  (1.549 m), weight 150 lb 8 oz (68.3 kg), SpO2 96%. Wt Readings from Last 3 Encounters:  01/27/24 150 lb 8 oz (68.3 kg)  01/06/24 150 lb 3.2 oz (68.1 kg)   12/16/23 148 lb 11.2 oz (67.4 kg)   Body mass index is 28.44 kg/m. Performance status (ECOG): 1 - Symptomatic but completely ambulatory Physical Exam Constitutional:      Appearance: Normal appearance. She is not ill-appearing.  HENT:     Mouth/Throat:     Mouth: Mucous membranes are moist.     Pharynx: Oropharynx is clear. No oropharyngeal exudate or posterior oropharyngeal erythema.  Cardiovascular:     Rate and Rhythm: Normal rate and regular rhythm.     Heart sounds: No murmur heard.    No friction rub. No gallop.  Pulmonary:     Effort: Pulmonary effort is normal. No respiratory distress.     Breath sounds: Normal breath sounds. No wheezing, rhonchi or rales.  Abdominal:     General: Bowel sounds are normal. There is no distension.     Palpations: Abdomen is soft. There is no mass.     Tenderness: There is no abdominal tenderness.  Musculoskeletal:        General: No swelling.     Left upper arm: Swelling present.     Left forearm: Swelling present.     Right lower leg: No edema.     Left lower leg: No edema.  Lymphadenopathy:     Cervical: No cervical adenopathy.     Upper Body:     Right upper body: No supraclavicular or axillary adenopathy.     Left upper body: No supraclavicular or axillary adenopathy.     Lower Body: No right inguinal adenopathy.  No left inguinal adenopathy.  Skin:    General: Skin is warm.     Coloration: Skin is not jaundiced.     Findings: No lesion or rash.  Neurological:     General: No focal deficit present.     Mental Status: She is alert and oriented to person, place, and time. Mental status is at baseline.  Psychiatric:        Mood and Affect: Mood normal.        Behavior: Behavior normal.        Thought Content: Thought content normal.    SCANS:  CT scans of her chest/abdomen/pelvis revealed the following: FINDINGS: CT CHEST FINDINGS   Cardiovascular: Right Port-A-Cath tip low SVC. Aortic atherosclerosis. Tortuous thoracic  aorta. Mild cardiomegaly, without pericardial effusion. No central pulmonary embolism, on this non-dedicated study.   Mediastinum/Nodes: No supraclavicular adenopathy. No axillary adenopathy. Left axillary node dissection. No mediastinal or hilar adenopathy. Moderate hiatal hernia. No internal mammary adenopathy.   Lungs/Pleura: No pleural fluid. 2-4 mm pulmonary nodules are similar, including in the right apex on 17/302, left upper lobe on 24/302, and inferior right upper lobe on 53/302.   Musculoskeletal: Lateral right breast nodule of 1.8 cm on 26/3 101 is similar back to at least 06/04/2022. No acute osseous abnormality.T11-12 fusion is presumably   CT ABDOMEN PELVIS FINDINGS   Hepatobiliary: Bilateral hepatic cysts again identified. No suspicious liver lesion or biliary abnormality.   Pancreas: Normal, without mass or ductal dilatation.   Spleen: Normal in size, without focal abnormality.   Adrenals/Urinary Tract: Normal adrenal glands. Left greater than right renal sinus cysts without hydronephrosis. Inter/lower pole right renal 1.8 cm cyst or minimally complex cyst . In the absence of clinically indicated signs/symptoms require(s) no independent follow-up. Normal urinary bladder.   Stomach/Bowel: Normal remainder of the stomach. Large descending duodenal diverticula. Otherwise normal small bowel. Normal colon and terminal ileum.   Vascular/Lymphatic: Aortic atherosclerosis. No abdominopelvic adenopathy. Fluid density within the left inguinal region has enlarged at 12 mm on 106/301. 9 mm on the prior.   Reproductive: Normal uterus and adnexa.   Other: No significant free fluid. No free intraperitoneal air. No evidence of omental or peritoneal disease. Fat containing small right inguinal hernia.   Musculoskeletal: Bilateral proximal femoral subcentimeter sclerotic lesions are similar likely bone islands. Trace L4-5 anterolisthesis. developmental. Mild S shaped  thoracolumbar spine curvature.   IMPRESSION: 1. No evidence of metastatic disease in the chest, abdomen, or pelvis. 2. Ongoing stability of tiny bilateral pulmonary nodules, favored to be benign. 3. Increased size of fluid density left groin structure which is favored to represent fluid within a small left inguinal hernia. Of doubtful clinical significance. 4. Moderate hiatal hernia 5.  Aortic Atherosclerosis (ICD10-I70.0).  LABS:      Latest Ref Rng & Units 01/27/2024    1:17 PM 01/06/2024    1:04 PM 12/16/2023    1:10 PM  CBC  WBC 4.0 - 10.5 K/uL 2.1  2.5  3.1   Hemoglobin 12.0 - 15.0 g/dL 16.1  09.6  04.5   Hematocrit 36.0 - 46.0 % 33.8  33.8  34.6   Platelets 150 - 400 K/uL 133  124  112       Latest Ref Rng & Units 01/27/2024    1:17 PM 01/06/2024    1:04 PM 12/16/2023    1:10 PM  CMP  Glucose 70 - 99 mg/dL 409  92  811   BUN 8 - 23 mg/dL  13  10  14    Creatinine 0.44 - 1.00 mg/dL 4.54  0.98  1.19   Sodium 135 - 145 mmol/L 141  141  143   Potassium 3.5 - 5.1 mmol/L 3.8  3.8  3.8   Chloride 98 - 111 mmol/L 106  108  111   CO2 22 - 32 mmol/L 24  25  23    Calcium 8.9 - 10.3 mg/dL 9.5  9.5  9.4   Total Protein 6.5 - 8.1 g/dL 7.1  6.9  7.0   Total Bilirubin 0.0 - 1.2 mg/dL 0.4  0.3  0.3   Alkaline Phos 38 - 126 U/L 113  112  114   AST 15 - 41 U/L 47  37  37   ALT 0 - 44 U/L 30  27  25     ASSESSMENT & PLAN:  Assessment/Plan:  A 73 y.o. 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  In clinic today, I went over all of her CT scan images with her, for which she was understandably pleased to see that she essentially remains cancer free.  Based upon this, she will proceed with her 46th cycle of Enhertu today.  Per her physical exam today, she still has mild lymphedema in her left arm.  If it persists or get worse over time, I would consider referring her to a lymphedema specialist.  Otherwise, I will see this patient back in 3 weeks before she heads into her 47th  cycle of Enhertu.  The patient understands all the plans discussed today and is in agreement with them.     Lochlin Eppinger Kirby Funk, MD

## 2024-02-16 NOTE — Progress Notes (Unsigned)
 [facility]  [address] [place],  [place]  [address] [phone]  Clinic Day:  02/18/2024  Referring physician: Macie Saxon, MD   HISTORY OF PRESENT ILLNESS:  The patient is a 73 y.o. 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  She also had a subcarinal lymph node for which scans have consistently shown a positive response to therapy. She comes in today to be evaluated before heading into her 47th cycle of Enhertu .  The patient claims to have tolerated her 46th cycle of treatment fairly well.  She still has left arm swelling over the past few weeks, for which she wears an elastic sleeve.  Otherwise, she denies having any symptoms/findings which concern her for overt signs of disease progression.    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  The patient received 6 cycles of Taxotere/Herceptin /Perjeta before being switched to maintenance Perjeta/Herceptin , for which she took 73 cycles of treatment.  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  This all happened in the setting of a prolonged treatment break from TDM-1.  Since recurrence was found, the patient has been on Enhertu  since May 2022.   PHYSICAL EXAM:  Blood pressure 117/73, pulse 66, temperature 97.6 F (36.4 C), temperature source Oral, resp. rate 16, height 5\' 1"  (1.549 m), weight 150 lb 4.8 oz (68.2 kg), SpO2 99%. Wt Readings from Last 3 Encounters:  02/17/24 150 lb 4.8 oz (68.2 kg)  01/27/24 150 lb 8 oz (68.3 kg)  01/06/24 150 lb 3.2 oz (68.1 kg)   Body mass  index is 28.4 kg/m. Performance status (ECOG): 1 - Symptomatic but completely ambulatory Physical Exam Constitutional:      Appearance: Normal appearance. She is not ill-appearing.  HENT:     Mouth/Throat:     Mouth: Mucous membranes are moist.     Pharynx: Oropharynx is clear. No oropharyngeal exudate or posterior oropharyngeal erythema.  Cardiovascular:     Rate and Rhythm: Normal rate and regular rhythm.     Heart sounds: No murmur heard.    No friction rub. No gallop.  Pulmonary:     Effort: Pulmonary effort is normal. No respiratory distress.     Breath sounds: Normal breath sounds. No wheezing, rhonchi or rales.  Abdominal:     General: Bowel sounds are normal. There is no distension.     Palpations: Abdomen is soft. There is no mass.     Tenderness: There is no abdominal tenderness.  Musculoskeletal:        General: No swelling.     Left upper arm: Swelling present.     Left forearm: Swelling present.     Right lower leg: No edema.     Left lower leg: No edema.  Lymphadenopathy:     Cervical: No cervical adenopathy.     Upper Body:     Right upper body: No supraclavicular or axillary adenopathy.     Left upper body: No supraclavicular or axillary adenopathy.     Lower Body: No right inguinal adenopathy. No left inguinal adenopathy.  Skin:  General: Skin is warm.     Coloration: Skin is not jaundiced.     Findings: No lesion or rash.  Neurological:     General: No focal deficit present.     Mental Status: She is alert and oriented to person, place, and time. Mental status is at baseline.  Psychiatric:        Mood and Affect: Mood normal.        Behavior: Behavior normal.        Thought Content: Thought content normal.    LABS:      Latest Ref Rng & Units 02/17/2024   12:44 PM 01/27/2024    1:17 PM 01/06/2024    1:04 PM  CBC  WBC 4.0 - 10.5 K/uL 2.9  2.1  2.5   Hemoglobin 12.0 - 15.0 g/dL 40.9  81.1  91.4   Hematocrit 36.0 - 46.0 % 35.9  33.8  33.8    Platelets 150 - 400 K/uL 128  133  124       Latest Ref Rng & Units 02/17/2024   12:44 PM 01/27/2024    1:17 PM 01/06/2024    1:04 PM  CMP  Glucose 70 - 99 mg/dL 782  956  92   BUN 8 - 23 mg/dL 21  13  10    Creatinine 0.44 - 1.00 mg/dL 2.13  0.86  5.78   Sodium 135 - 145 mmol/L 140  141  141   Potassium 3.5 - 5.1 mmol/L 4.0  3.8  3.8   Chloride 98 - 111 mmol/L 108  106  108   CO2 22 - 32 mmol/L 24  24  25    Calcium 8.9 - 10.3 mg/dL 9.7  9.5  9.5   Total Protein 6.5 - 8.1 g/dL 7.2  7.1  6.9   Total Bilirubin 0.0 - 1.2 mg/dL 0.4  0.4  0.3   Alkaline Phos 38 - 126 U/L 110  113  112   AST 15 - 41 U/L 40  47  37   ALT 0 - 44 U/L 26  30  27     ASSESSMENT & PLAN:  Assessment/Plan:  A 74 y.o. 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  She will proceed with her 47th cycle of Enhertu  today.  Per her physical exam today, she still has mild lymphedema in her left arm.  I will try again to refer her to a local lymphedema specialist to have this evaluated.  Otherwise, as she is doing well, I will see this patient back in 3 weeks before she heads into her 48th cycle of Enhertu .  The patient understands all the plans discussed today and is in agreement with them.     Aashrith Eves Felicia Horde, MD

## 2024-02-17 ENCOUNTER — Inpatient Hospital Stay: Payer: Self-pay

## 2024-02-17 ENCOUNTER — Inpatient Hospital Stay: Payer: Self-pay | Attending: Oncology | Admitting: Oncology

## 2024-02-17 VITALS — BP 117/73 | HR 66 | Temp 97.6°F | Resp 16 | Ht 61.0 in | Wt 150.3 lb

## 2024-02-17 DIAGNOSIS — C50412 Malignant neoplasm of upper-outer quadrant of left female breast: Secondary | ICD-10-CM

## 2024-02-17 DIAGNOSIS — C50912 Malignant neoplasm of unspecified site of left female breast: Secondary | ICD-10-CM | POA: Insufficient documentation

## 2024-02-17 DIAGNOSIS — Z79899 Other long term (current) drug therapy: Secondary | ICD-10-CM | POA: Insufficient documentation

## 2024-02-17 DIAGNOSIS — Z5112 Encounter for antineoplastic immunotherapy: Secondary | ICD-10-CM | POA: Insufficient documentation

## 2024-02-17 DIAGNOSIS — Z170 Estrogen receptor positive status [ER+]: Secondary | ICD-10-CM

## 2024-02-17 DIAGNOSIS — C7931 Secondary malignant neoplasm of brain: Secondary | ICD-10-CM | POA: Insufficient documentation

## 2024-02-17 DIAGNOSIS — C7800 Secondary malignant neoplasm of unspecified lung: Secondary | ICD-10-CM | POA: Insufficient documentation

## 2024-02-17 LAB — CBC WITH DIFFERENTIAL (CANCER CENTER ONLY)
Abs Immature Granulocytes: 0.01 10*3/uL (ref 0.00–0.07)
Basophils Absolute: 0 10*3/uL (ref 0.0–0.1)
Basophils Relative: 0 %
Eosinophils Absolute: 0.1 10*3/uL (ref 0.0–0.5)
Eosinophils Relative: 3 %
HCT: 35.9 % — ABNORMAL LOW (ref 36.0–46.0)
Hemoglobin: 12 g/dL (ref 12.0–15.0)
Immature Granulocytes: 0 %
Lymphocytes Relative: 17 %
Lymphs Abs: 0.5 10*3/uL — ABNORMAL LOW (ref 0.7–4.0)
MCH: 31.5 pg (ref 26.0–34.0)
MCHC: 33.4 g/dL (ref 30.0–36.0)
MCV: 94.2 fL (ref 80.0–100.0)
Monocytes Absolute: 0.3 10*3/uL (ref 0.1–1.0)
Monocytes Relative: 10 %
Neutro Abs: 2 10*3/uL (ref 1.7–7.7)
Neutrophils Relative %: 70 %
Platelet Count: 128 10*3/uL — ABNORMAL LOW (ref 150–400)
RBC: 3.81 MIL/uL — ABNORMAL LOW (ref 3.87–5.11)
RDW: 15.5 % (ref 11.5–15.5)
WBC Count: 2.9 10*3/uL — ABNORMAL LOW (ref 4.0–10.5)
nRBC: 0 % (ref 0.0–0.2)
nRBC: 0 /100{WBCs}

## 2024-02-17 LAB — CMP (CANCER CENTER ONLY)
ALT: 26 U/L (ref 0–44)
AST: 40 U/L (ref 15–41)
Albumin: 3.7 g/dL (ref 3.5–5.0)
Alkaline Phosphatase: 110 U/L (ref 38–126)
Anion gap: 9 (ref 5–15)
BUN: 21 mg/dL (ref 8–23)
CO2: 24 mmol/L (ref 22–32)
Calcium: 9.7 mg/dL (ref 8.9–10.3)
Chloride: 108 mmol/L (ref 98–111)
Creatinine: 0.78 mg/dL (ref 0.44–1.00)
GFR, Estimated: >60 mL/min (ref >60)
Glucose, Bld: 104 mg/dL — ABNORMAL HIGH (ref 70–99)
Potassium: 4 mmol/L (ref 3.5–5.1)
Sodium: 140 mmol/L (ref 135–145)
Total Bilirubin: 0.4 mg/dL (ref 0.0–1.2)
Total Protein: 7.2 g/dL (ref 6.5–8.1)

## 2024-02-17 MED ORDER — ACETAMINOPHEN 325 MG PO TABS
650.0000 mg | ORAL_TABLET | Freq: Once | ORAL | Status: AC
Start: 2024-02-17 — End: 2024-02-17
  Administered 2024-02-17: 650 mg via ORAL
  Filled 2024-02-17: qty 2

## 2024-02-17 MED ORDER — DEXAMETHASONE SODIUM PHOSPHATE 10 MG/ML IJ SOLN
10.0000 mg | Freq: Once | INTRAMUSCULAR | Status: AC
  Administered 2024-02-17: 10 mg via INTRAVENOUS
  Filled 2024-02-17: qty 1

## 2024-02-17 MED ORDER — PALONOSETRON HCL INJECTION 0.25 MG/5ML
0.2500 mg | Freq: Once | INTRAVENOUS | Status: AC
  Administered 2024-02-17: 0.25 mg via INTRAVENOUS
  Filled 2024-02-17: qty 5

## 2024-02-17 MED ORDER — DEXTROSE 5 % IV SOLN: Freq: Once | INTRAVENOUS | Status: AC

## 2024-02-17 MED ORDER — SODIUM CHLORIDE 0.9% FLUSH
10.0000 mL | INTRAVENOUS | Status: DC | PRN
  Administered 2024-02-17: 10 mL

## 2024-02-17 MED ORDER — HEPARIN SOD (PORK) LOCK FLUSH 100 UNIT/ML IV SOLN
500.0000 [IU] | Freq: Once | INTRAVENOUS | Status: AC | PRN
  Administered 2024-02-17: 500 [IU]

## 2024-02-17 MED ORDER — DIPHENHYDRAMINE HCL 25 MG PO CAPS
50.0000 mg | ORAL_CAPSULE | Freq: Once | ORAL | Status: AC
  Administered 2024-02-17: 50 mg via ORAL
  Filled 2024-02-17: qty 2

## 2024-02-17 MED ORDER — FAM-TRASTUZUMAB DERUXTECAN-NXKI CHEMO 100 MG IV SOLR
3.6300 mg/kg | Freq: Once | INTRAVENOUS | Status: AC
  Administered 2024-02-17: 240 mg via INTRAVENOUS
  Filled 2024-02-17: qty 12

## 2024-02-17 NOTE — Patient Instructions (Signed)
 Fam-Trastuzumab Deruxtecan Injection Qu es este medicamento? El FAM-TRASTUZUMAB DERUXTECAN trata algunos tipos de cncer. Acta bloqueando una protena que hace que las clulas cancerosas crezcan y se multipliquen. Esto ayuda a Neurosurgeon propagacin de las clulas cancerosas. Este medicamento puede ser utilizado para otros usos; si tiene alguna pregunta consulte con su proveedor de atencin mdica o con su farmacutico. MARCAS COMUNES: ENHERTU Qu le debo informar a mi profesional de la salud antes de tomar este medicamento? Necesitan saber si usted presenta alguno de los [employer] o situaciones: Enfermedad cardiaca Insuficiencia cardiaca Infeccin, especialmente infecciones virales, tales como varicela, fuegos labiales o herpes Enfermedad heptica Enfermedad pulmonar o respiratoria, tales como asma o EPOC Una reaccin alrgica o inusual al fam-trastuzumab deruxtecan, a otros medicamentos, alimentos, colorantes o conservantes Si est embarazada o buscando quedar embarazada Si est amamantando a un beb Cmo debo utilizar este medicamento? Este medicamento se inyecta en una vena. Su equipo de atencin lo [place] en un hospital o en un entorno clnico. Se le entregar una Gua del medicamento (MedGuide, su nombre en ingls) especial antes de cada tratamiento. Asegrese de leer esta informacin cada vez cuidadosamente. Hable con su equipo de atencin sobre el uso de este medicamento en nios. Puede requerir atencin especial. Sobredosis: Pngase en contacto inmediatamente con un centro toxicolgico o una sala de urgencia si usted cree que haya tomado demasiado medicamento.<br>ATENCIN: [employer] es solo para usted. No comparta este medicamento con nadie. Qu sucede si me olvido de una dosis? Es importante no olvidar ninguna dosis. Llame a su equipo de atencin si no puede asistir a una cita. Qu puede interactuar con este medicamento? No se anticipan  interacciones. Puede ser que esta lista no menciona todas las posibles interacciones. Informe a su profesional de [employer] de [employer] productos a base de hierbas, medicamentos de [place] o suplementos nutritivos que est tomando. Si usted fuma, consume bebidas alcohlicas o si utiliza drogas ilegales, indqueselo tambin a su profesional de [employer]. Algunas sustancias pueden interactuar con su medicamento. A qu debo estar atento al usar [employer]? Visite a su equipo de atencin para que revise su evolucin peridicamente. Informe a su equipo de atencin si los sntomas no comienzan a mejorar o si empeoran. Este medicamento puede aumentar su riesgo de contraer una infeccin. Llame para pedir consejo a su equipo de atencin si tiene fiebre, escalofros, dolor de garganta o cualquier otro sntoma de resfriado o gripe. No se trate usted mismo. Trate de no acercarse a personas que estn enfermas. Evite usar medicamentos que contengan aspirina, acetaminofeno, ibuprofeno, naproxeno o ketoprofeno, a menos que as lo indique su equipo de atencin. Estos medicamentos pueden ocultar la fiebre. Proceda con cuidado al cepillar sus dientes, o al usar hilo dental o palillos para los dientes, ya que podra contraer una infeccin o Geophysicist/field seismologist con mayor facilidad. Si recibe algn tratamiento dental, informe a su dentista que est [employer]. Este medicamento puede resecarle los ojos y provocar visin borrosa. Si [place] lentes de contacto, podra sentir ciertas molestias. Las gotas lubricantes para los ojos pueden ser tiles. Si el problema no desaparece o es grave, visite a su equipo de atencin. Hable con su equipo de atencin si podra estar en embarazo. Este medicamento puede causar defectos congnitos graves si se [place] durante el embarazo y por 7 meses despus de la ltima dosis. Si su pareja puede quedar Zimm[place], use un condn [employer] [place] [employer] sexuales mientras est usando este medicamento  y  por 4 meses despus de la ltima dosis. No debe amamantar a un beb mientras [place] este medicamento y por 7 meses despus de la ltima dosis. Este medicamento podra causar infertilidad. Hable con su equipo de atencin si le preocupa su fertilidad. Qu efectos secundarios puedo tener al [employer] este medicamento? Efectos secundarios que debe informar a su equipo de atencin tan pronto como sea posible: Reacciones alrgicas: erupcin cutnea, comezn/picazn, urticaria, hinchazn de la cara, los labios, la lengua o la garganta Tos seca, falta de aire o problemas para respirar Infeccin: fiebre, escalofros, tos, dolor de garganta, heridas que no sanan, dolor o problemas para Geographical information systems officer, sensacin general de molestia o Sports administrator cardiaca: falta de aire, hinchazn de los tobillos, los pies o las [place], aumento de peso repentino, debilidad o fatiga inusuales [place] o moretones inusuales Efectos secundarios que generalmente no requieren atencin mdica (debe informarlos a su equipo de atencin si persisten o si son molestos): Estreimiento Diarrea Cada del cabello Dolor muscular Nuseas Vmito Puede ser que esta lista no menciona todos los posibles efectos secundarios. Comunquese a su mdico por asesoramiento mdico [employer]. Usted puede informar los efectos secundarios a la FDA por telfono al 1-800-FDA-1088. Dnde debo guardar mi medicina? Este medicamento se administra en hospitales o clnicas. No se guarda en su casa. ATENCIN: Este folleto es un resumen. Puede ser que no cubra toda la posible informacin. Si usted tiene preguntas acerca de esta medicina, consulte con su mdico, su farmacutico o su profesional de Radiographer, therapeutic.  2024 Elsevier/Gold Standard (2023-08-12 00:00:00)

## 2024-02-18 ENCOUNTER — Other Ambulatory Visit: Payer: Self-pay

## 2024-02-18 ENCOUNTER — Encounter: Payer: Self-pay | Admitting: Oncology

## 2024-03-08 NOTE — Progress Notes (Unsigned)
 [facility]  [address] [place],  [place]  [address] [phone]  Clinic Day:  03/09/2024  Referring physician: Deloria Fetch, MD   HISTORY OF PRESENT ILLNESS:  The patient is a 73 y.o. 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  She also had a subcarinal lymph node for which scans have consistently shown a positive response to therapy. She comes in today to be evaluated before heading into her 48th cycle of Enhertu .  The patient claims to have tolerated her 47th cycle of treatment fairly well.  She still has left arm swelling over the past few weeks, for which she wears an elastic sleeve.  Otherwise, she denies having any symptoms/findings which concern her for overt signs of disease progression.    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  The patient received 6 cycles of Taxotere/Herceptin /Perjeta before being switched to maintenance Perjeta/Herceptin , for which she took 73 cycles of treatment.  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  This all happened in the setting of a prolonged treatment break from TDM-1.  Since recurrence was found, the patient has been on Enhertu  since May 2022.   PHYSICAL EXAM:  Blood pressure 123/70, pulse 63, temperature (!) 97.5 F (36.4 C), temperature source Oral, resp. rate 14, height 5\' 1"  (1.549 m), weight 151 lb 12.8 oz (68.9 kg), SpO2 99%. Wt Readings from Last 3 Encounters:  03/09/24 151 lb 12.8 oz (68.9 kg)  02/17/24 150 lb 4.8 oz (68.2 kg)  01/27/24 150 lb 8 oz (68.3 kg)    Body mass index is 28.68 kg/m. Performance status (ECOG): 1 - Symptomatic but completely ambulatory Physical Exam Constitutional:      Appearance: Normal appearance. She is not ill-appearing.  HENT:     Mouth/Throat:     Mouth: Mucous membranes are moist.     Pharynx: Oropharynx is clear. No oropharyngeal exudate or posterior oropharyngeal erythema.  Cardiovascular:     Rate and Rhythm: Normal rate and regular rhythm.     Heart sounds: No murmur heard.    No friction rub. No gallop.  Pulmonary:     Effort: Pulmonary effort is normal. No respiratory distress.     Breath sounds: Normal breath sounds. No wheezing, rhonchi or rales.  Abdominal:     General: Bowel sounds are normal. There is no distension.     Palpations: Abdomen is soft. There is no mass.     Tenderness: There is no abdominal tenderness.  Musculoskeletal:        General: No swelling.     Left upper arm: Swelling present.     Left forearm: Swelling present.     Right lower leg: No edema.     Left lower leg: No edema.  Lymphadenopathy:     Cervical: No cervical adenopathy.     Upper Body:     Right upper body: No supraclavicular or axillary adenopathy.     Left upper body: No supraclavicular or axillary adenopathy.     Lower Body: No right inguinal adenopathy. No left inguinal adenopathy.  Skin:  General: Skin is warm.     Coloration: Skin is not jaundiced.     Findings: No lesion or rash.  Neurological:     General: No focal deficit present.     Mental Status: She is alert and oriented to person, place, and time. Mental status is at baseline.  Psychiatric:        Mood and Affect: Mood normal.        Behavior: Behavior normal.        Thought Content: Thought content normal.    LABS:      Latest Ref Rng & Units 03/09/2024    1:00 PM 02/17/2024   12:44 PM 01/27/2024    1:17 PM  CBC  WBC 4.0 - 10.5 K/uL 2.4  2.9  2.1   Hemoglobin 12.0 - 15.0 g/dL 16.1  09.6  04.5   Hematocrit 36.0 - 46.0 % 37.3  35.9   33.8   Platelets 150 - 400 K/uL 132  128  133       Latest Ref Rng & Units 03/09/2024    1:00 PM 02/17/2024   12:44 PM 01/27/2024    1:17 PM  CMP  Glucose 70 - 99 mg/dL 90  409  811   BUN 8 - 23 mg/dL 19  21  13    Creatinine 0.44 - 1.00 mg/dL 9.14  7.82  9.56   Sodium 135 - 145 mmol/L 142  140  141   Potassium 3.5 - 5.1 mmol/L 3.8  4.0  3.8   Chloride 98 - 111 mmol/L 108  108  106   CO2 22 - 32 mmol/L 24  24  24    Calcium 8.9 - 10.3 mg/dL 9.8  9.7  9.5   Total Protein 6.5 - 8.1 g/dL 7.5  7.2  7.1   Total Bilirubin 0.0 - 1.2 mg/dL 0.4  0.4  0.4   Alkaline Phos 38 - 126 U/L 124  110  113   AST 15 - 41 U/L 45  40  47   ALT 0 - 44 U/L 34  26  30    ASSESSMENT & PLAN:  Assessment/Plan:  A 73 y.o. 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  She will proceed with her 48th cycle of Enhertu  today.  Per her physical exam today, she still has mild lymphedema in her left arm.  I will try again to refer her to a local lymphedema specialist to have this evaluated.  Otherwise, as she is doing well, I will see this patient back in 3 weeks before she heads into her 49th cycle of Enhertu .  The patient understands all the plans discussed today and is in agreement with them.     Dominie Benedick Felicia Horde, MD

## 2024-03-09 ENCOUNTER — Inpatient Hospital Stay: Payer: Self-pay

## 2024-03-09 ENCOUNTER — Inpatient Hospital Stay: Payer: Self-pay | Attending: Oncology

## 2024-03-09 ENCOUNTER — Inpatient Hospital Stay (HOSPITAL_BASED_OUTPATIENT_CLINIC_OR_DEPARTMENT_OTHER): Payer: Self-pay | Admitting: Oncology

## 2024-03-09 VITALS — BP 123/70 | HR 63 | Temp 97.5°F | Resp 14 | Ht 61.0 in | Wt 151.8 lb

## 2024-03-09 DIAGNOSIS — C7931 Secondary malignant neoplasm of brain: Secondary | ICD-10-CM | POA: Insufficient documentation

## 2024-03-09 DIAGNOSIS — Z5112 Encounter for antineoplastic immunotherapy: Secondary | ICD-10-CM | POA: Insufficient documentation

## 2024-03-09 DIAGNOSIS — C50412 Malignant neoplasm of upper-outer quadrant of left female breast: Secondary | ICD-10-CM

## 2024-03-09 DIAGNOSIS — Z1731 Human epidermal growth factor receptor 2 positive status: Secondary | ICD-10-CM | POA: Insufficient documentation

## 2024-03-09 DIAGNOSIS — C50912 Malignant neoplasm of unspecified site of left female breast: Secondary | ICD-10-CM | POA: Insufficient documentation

## 2024-03-09 DIAGNOSIS — Z171 Estrogen receptor negative status [ER-]: Secondary | ICD-10-CM

## 2024-03-09 LAB — CMP (CANCER CENTER ONLY)
ALT: 34 U/L (ref 0–44)
AST: 45 U/L — ABNORMAL HIGH (ref 15–41)
Albumin: 3.7 g/dL (ref 3.5–5.0)
Alkaline Phosphatase: 124 U/L (ref 38–126)
Anion gap: 10 (ref 5–15)
BUN: 19 mg/dL (ref 8–23)
CO2: 24 mmol/L (ref 22–32)
Calcium: 9.8 mg/dL (ref 8.9–10.3)
Chloride: 108 mmol/L (ref 98–111)
Creatinine: 0.78 mg/dL (ref 0.44–1.00)
GFR, Estimated: >60 mL/min (ref >60)
Glucose, Bld: 90 mg/dL (ref 70–99)
Potassium: 3.8 mmol/L (ref 3.5–5.1)
Sodium: 142 mmol/L (ref 135–145)
Total Bilirubin: 0.4 mg/dL (ref 0.0–1.2)
Total Protein: 7.5 g/dL (ref 6.5–8.1)

## 2024-03-09 LAB — CBC WITH DIFFERENTIAL (CANCER CENTER ONLY)
Abs Immature Granulocytes: 0.01 10*3/uL (ref 0.00–0.07)
Basophils Absolute: 0 10*3/uL (ref 0.0–0.1)
Basophils Relative: 0 %
Eosinophils Absolute: 0.1 10*3/uL (ref 0.0–0.5)
Eosinophils Relative: 3 %
HCT: 37.3 % (ref 36.0–46.0)
Hemoglobin: 12.6 g/dL (ref 12.0–15.0)
Immature Granulocytes: 0 %
Lymphocytes Relative: 26 %
Lymphs Abs: 0.6 10*3/uL — ABNORMAL LOW (ref 0.7–4.0)
MCH: 31.9 pg (ref 26.0–34.0)
MCHC: 33.8 g/dL (ref 30.0–36.0)
MCV: 94.4 fL (ref 80.0–100.0)
Monocytes Absolute: 0.3 10*3/uL (ref 0.1–1.0)
Monocytes Relative: 12 %
Neutro Abs: 1.4 10*3/uL — ABNORMAL LOW (ref 1.7–7.7)
Neutrophils Relative %: 59 %
Platelet Count: 132 10*3/uL — ABNORMAL LOW (ref 150–400)
RBC: 3.95 MIL/uL (ref 3.87–5.11)
RDW: 14.8 % (ref 11.5–15.5)
WBC Count: 2.4 10*3/uL — ABNORMAL LOW (ref 4.0–10.5)
nRBC: 0 % (ref 0.0–0.2)

## 2024-03-09 MED ORDER — ACETAMINOPHEN 325 MG PO TABS
650.0000 mg | ORAL_TABLET | Freq: Once | ORAL | Status: AC
  Administered 2024-03-09: 650 mg via ORAL
  Filled 2024-03-09: qty 2

## 2024-03-09 MED ORDER — FAM-TRASTUZUMAB DERUXTECAN-NXKI CHEMO 100 MG IV SOLR
3.6300 mg/kg | Freq: Once | INTRAVENOUS | Status: AC
  Administered 2024-03-09: 240 mg via INTRAVENOUS
  Filled 2024-03-09: qty 12

## 2024-03-09 MED ORDER — PALONOSETRON HCL INJECTION 0.25 MG/5ML
0.2500 mg | Freq: Once | INTRAVENOUS | Status: AC
  Administered 2024-03-09: 0.25 mg via INTRAVENOUS
  Filled 2024-03-09: qty 5

## 2024-03-09 MED ORDER — SODIUM CHLORIDE 0.9% FLUSH: 10.0000 mL | INTRAVENOUS | Status: DC | PRN

## 2024-03-09 MED ORDER — HEPARIN SOD (PORK) LOCK FLUSH 100 UNIT/ML IV SOLN: 500.0000 [IU] | Freq: Once | INTRAVENOUS | Status: DC | PRN

## 2024-03-09 MED ORDER — DEXTROSE 5 % IV SOLN: Freq: Once | INTRAVENOUS | Status: AC

## 2024-03-09 MED ORDER — DEXAMETHASONE SODIUM PHOSPHATE 10 MG/ML IJ SOLN
10.0000 mg | Freq: Once | INTRAMUSCULAR | Status: AC
Start: 2024-03-09 — End: 2024-03-09
  Administered 2024-03-09: 10 mg via INTRAVENOUS
  Filled 2024-03-09: qty 1

## 2024-03-09 MED ORDER — DIPHENHYDRAMINE HCL 25 MG PO CAPS
50.0000 mg | ORAL_CAPSULE | Freq: Once | ORAL | Status: AC
  Administered 2024-03-09: 50 mg via ORAL
  Filled 2024-03-09: qty 2

## 2024-03-09 NOTE — Patient Instructions (Signed)
 Fam-Trastuzumab Deruxtecan Injection What is this medication? FAM-TRASTUZUMAB DERUXTECAN (fam-tras TOOZ eu mab DER ux TEE kan) treats some types of cancer. It works by blocking a protein that causes cancer cells to grow and multiply. This helps to slow or stop the spread of cancer cells. This medicine may be used for other purposes; ask your health care provider or pharmacist if you have questions. COMMON BRAND NAME(S): ENHERTU What should I tell my care team before I take this medication? They need to know if you have any of these conditions: Heart disease Heart failure Infection, especially a viral infection, such as chickenpox, cold sores, or herpes Liver disease Lung or breathing disease, such as asthma or COPD An unusual or allergic reaction to fam-trastuzumab deruxtecan, other medications, foods, dyes, or preservatives Pregnant or trying to get pregnant Breast-feeding How should I use this medication? This medication is injected into a vein. It is given by your care team in a hospital or clinic setting. A special MedGuide will be given to you before each treatment. Be sure to read this information carefully each time. Talk to your care team about the use of this medication in children. Special care may be needed. Overdosage: If you think you have taken too much of this medicine contact a poison control center or emergency room at once. NOTE: This medicine is only for you. Do not share this medicine with others. What if I miss a dose? It is important not to miss your dose. Call your care team if you are unable to keep an appointment. What may interact with this medication? Interactions are not expected. This list may not describe all possible interactions. Give your health care provider a list of all the medicines, herbs, non-prescription drugs, or dietary supplements you use. Also tell them if you smoke, drink alcohol, or use illegal drugs. Some items may interact with your  medicine. What should I watch for while using this medication? Visit your care team for regular checks on your progress. Tell your care team if your symptoms do not start to get better or if they get worse. This medication may increase your risk of getting an infection. Call your care team for advice if you get a fever, chills, sore throat, or other symptoms of a cold or flu. Do not treat yourself. Try to avoid being around people who are sick. Avoid taking medications that contain aspirin, acetaminophen, ibuprofen, naproxen, or ketoprofen unless instructed by your care team. These medications may hide a fever. Be careful brushing or flossing your teeth or using a toothpick because you may get an infection or bleed more easily. If you have any dental work done, tell your dentist you are receiving this medication. This medication may cause dry eyes and blurred vision. If you wear contact lenses, you may feel some discomfort. Lubricating eye drops may help. See your care team if the problem does not go away or is severe. Talk to your care team if you may be pregnant. Serious birth defects can occur if you take this medication during pregnancy and for 7 months after the last dose. If your partner can get pregnant, use a condom during sex while taking this medication and for 4 months after the last dose. Do not breastfeed while taking this medication and for 7 months after the last dose. This medication may cause infertility. Talk to your care team if you are concerned about your fertility. What side effects may I notice from receiving this medication? Side effects  that you should report to your care team as soon as possible: Allergic reactions--skin rash, itching, hives, swelling of the face, lips, tongue, or throat Dry cough, shortness of breath or trouble breathing Infection--fever, chills, cough, sore throat, wounds that don't heal, pain or trouble when passing urine, general feeling of discomfort or  being unwell Heart failure--shortness of breath, swelling of the ankles, feet, or hands, sudden weight gain, unusual weakness or fatigue Unusual bruising or bleeding Side effects that usually do not require medical attention (report these to your care team if they continue or are bothersome): Constipation Diarrhea Hair loss Muscle pain Nausea Vomiting This list may not describe all possible side effects. Call your doctor for medical advice about side effects. You may report side effects to FDA at 1-800-FDA-1088. Where should I keep my medication? This medication is given in a hospital or clinic. It will not be stored at home. NOTE: This sheet is a summary. It may not cover all possible information. If you have questions about this medicine, talk to your doctor, pharmacist, or health care provider.  2024 Elsevier/Gold Standard (2023-06-14 00:00:00)

## 2024-03-09 NOTE — Progress Notes (Signed)
 Per Dr Harles Lied, OK to treat pt with ANC of 1.4

## 2024-03-10 ENCOUNTER — Telehealth: Payer: Self-pay | Admitting: Oncology

## 2024-03-10 ENCOUNTER — Other Ambulatory Visit: Payer: Self-pay

## 2024-03-10 NOTE — Telephone Encounter (Signed)
 Patient has been scheduled for follow-up visit per 03/09/24 LOS.  LVM notifying pt of appt details, provided my direct number to pt if appt changes need to be made.     Transporation request sent to [employer] for DOS 03/30/24 and 04/20/24.

## 2024-03-11 ENCOUNTER — Other Ambulatory Visit: Payer: Self-pay

## 2024-03-13 ENCOUNTER — Encounter: Payer: Self-pay | Admitting: Oncology

## 2024-03-30 ENCOUNTER — Encounter: Payer: Self-pay | Admitting: Oncology

## 2024-03-30 ENCOUNTER — Inpatient Hospital Stay (HOSPITAL_BASED_OUTPATIENT_CLINIC_OR_DEPARTMENT_OTHER): Payer: Self-pay | Admitting: Hematology and Oncology

## 2024-03-30 ENCOUNTER — Inpatient Hospital Stay: Payer: Self-pay

## 2024-03-30 ENCOUNTER — Other Ambulatory Visit: Payer: Self-pay

## 2024-03-30 ENCOUNTER — Inpatient Hospital Stay: Payer: Self-pay | Attending: Oncology

## 2024-03-30 VITALS — Resp 16

## 2024-03-30 VITALS — BP 108/73 | HR 72 | Temp 98.0°F | Ht 61.0 in | Wt 152.1 lb

## 2024-03-30 DIAGNOSIS — C50412 Malignant neoplasm of upper-outer quadrant of left female breast: Secondary | ICD-10-CM

## 2024-03-30 DIAGNOSIS — Z5112 Encounter for antineoplastic immunotherapy: Secondary | ICD-10-CM | POA: Insufficient documentation

## 2024-03-30 DIAGNOSIS — C7931 Secondary malignant neoplasm of brain: Secondary | ICD-10-CM | POA: Insufficient documentation

## 2024-03-30 DIAGNOSIS — Z171 Estrogen receptor negative status [ER-]: Secondary | ICD-10-CM

## 2024-03-30 DIAGNOSIS — Z17420 Hormone receptor negative with human epidermal growth factor receptor 2 positive status: Secondary | ICD-10-CM | POA: Insufficient documentation

## 2024-03-30 LAB — CMP (CANCER CENTER ONLY)
ALT: 27 U/L (ref 0–44)
AST: 41 U/L (ref 15–41)
Albumin: 3.7 g/dL (ref 3.5–5.0)
Alkaline Phosphatase: 115 U/L (ref 38–126)
Anion gap: 10 (ref 5–15)
BUN: 17 mg/dL (ref 8–23)
CO2: 23 mmol/L (ref 22–32)
Calcium: 9.7 mg/dL (ref 8.9–10.3)
Chloride: 109 mmol/L (ref 98–111)
Creatinine: 0.88 mg/dL (ref 0.44–1.00)
GFR, Estimated: >60 mL/min (ref >60)
Glucose, Bld: 102 mg/dL — ABNORMAL HIGH (ref 70–99)
Potassium: 3.9 mmol/L (ref 3.5–5.1)
Sodium: 142 mmol/L (ref 135–145)
Total Bilirubin: 0.3 mg/dL (ref 0.0–1.2)
Total Protein: 7.2 g/dL (ref 6.5–8.1)

## 2024-03-30 LAB — CBC WITH DIFFERENTIAL (CANCER CENTER ONLY)
Abs Immature Granulocytes: 0.01 10*3/uL (ref 0.00–0.07)
Basophils Absolute: 0 10*3/uL (ref 0.0–0.1)
Basophils Relative: 0 %
Eosinophils Absolute: 0.1 10*3/uL (ref 0.0–0.5)
Eosinophils Relative: 3 %
HCT: 37.2 % (ref 36.0–46.0)
Hemoglobin: 12.1 g/dL (ref 12.0–15.0)
Immature Granulocytes: 0 %
Lymphocytes Relative: 22 %
Lymphs Abs: 0.6 10*3/uL — ABNORMAL LOW (ref 0.7–4.0)
MCH: 31.1 pg (ref 26.0–34.0)
MCHC: 32.5 g/dL (ref 30.0–36.0)
MCV: 95.6 fL (ref 80.0–100.0)
Monocytes Absolute: 0.3 10*3/uL (ref 0.1–1.0)
Monocytes Relative: 10 %
Neutro Abs: 1.8 10*3/uL (ref 1.7–7.7)
Neutrophils Relative %: 65 %
Platelet Count: 117 10*3/uL — ABNORMAL LOW (ref 150–400)
RBC: 3.89 MIL/uL (ref 3.87–5.11)
RDW: 14.8 % (ref 11.5–15.5)
WBC Count: 2.8 10*3/uL — ABNORMAL LOW (ref 4.0–10.5)
nRBC: 0 % (ref 0.0–0.2)

## 2024-03-30 MED ORDER — DEXAMETHASONE SODIUM PHOSPHATE 10 MG/ML IJ SOLN
10.0000 mg | Freq: Once | INTRAMUSCULAR | Status: AC
  Administered 2024-03-30: 10 mg via INTRAVENOUS
  Filled 2024-03-30: qty 1

## 2024-03-30 MED ORDER — PALONOSETRON HCL INJECTION 0.25 MG/5ML
0.2500 mg | Freq: Once | INTRAVENOUS | Status: AC
  Administered 2024-03-30: 0.25 mg via INTRAVENOUS
  Filled 2024-03-30: qty 5

## 2024-03-30 MED ORDER — DIPHENHYDRAMINE HCL 25 MG PO CAPS
50.0000 mg | ORAL_CAPSULE | Freq: Once | ORAL | Status: AC
  Administered 2024-03-30: 50 mg via ORAL
  Filled 2024-03-30: qty 2

## 2024-03-30 MED ORDER — DEXTROSE 5 % IV SOLN: Freq: Once | INTRAVENOUS | Status: AC

## 2024-03-30 MED ORDER — FAM-TRASTUZUMAB DERUXTECAN-NXKI CHEMO 100 MG IV SOLR
3.6300 mg/kg | Freq: Once | INTRAVENOUS | Status: AC
  Administered 2024-03-30: 240 mg via INTRAVENOUS
  Filled 2024-03-30: qty 12

## 2024-03-30 MED ORDER — ACETAMINOPHEN 325 MG PO TABS
650.0000 mg | ORAL_TABLET | Freq: Once | ORAL | Status: AC
  Administered 2024-03-30: 650 mg via ORAL
  Filled 2024-03-30: qty 2

## 2024-03-30 NOTE — Telephone Encounter (Signed)
 Pt's daughter called to confirm transportation for DOS 03/30/24. Per [facility] the only thing arranged for today's DOS was pick up from our office at 4:45 pm.. Pt's daughter states that Mrs. Teresa [name] will need a ride to our office as well as back home so I have contacted [facility] and they will pick pt up at 12:30 pm to bring her here. Daughter is aware.

## 2024-03-30 NOTE — Progress Notes (Signed)
 [facility]  [address] [place],  [place]  [address] [phone]  Clinic Day:  03/30/2024  Referring physician: Macie Saxon, MD  ASSESSMENT & PLAN:   Assessment & Plan:  A 73 y.o. 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  She will proceed with her 49th cycle of Enhertu  today.  Per her physical exam today, she still has mild lymphedema in her left arm. Otherwise, as she is doing well, Dr. Harles Lied will see this patient back in 3 weeks before she heads into her 50th cycle of Enhertu .  The patient understands all the plans discussed today and is in agreement with them.       The patient understands the plans discussed today and is in agreement with them.  She knows to contact our office if she develops concerns prior to her next appointment.   I provided 20 minutes of face-to-face time during this encounter and > 50% was spent counseling as documented under my assessment and plan.    Adelaide Adjutant, NP  [facility] - A DEPT OF [facility] [address] [place] [address] Dept: [phone] Dept Fax: [phone]   No orders of the defined types were placed in this encounter.     CHIEF COMPLAINT:  CC: Malignant neoplasm of upper-outer quadrant of left female breast  Current Treatment:  Enhertu   HISTORY OF PRESENT ILLNESS:   Oncology History  Malignant neoplasm of upper-outer quadrant of left female breast (HCC)  07/15/2020 Initial Diagnosis   Malignant neoplasm of upper-outer quadrant of left female breast (HCC)   03/21/2021 - 06/07/2022 Chemotherapy   Patient is on Treatment Plan : BREAST METASTATIC fam-trastuzumab deruxtecan-nxki  (Enhertu ) q21d     03/22/2021 -  Chemotherapy   Patient is on Treatment Plan : BREAST METASTATIC Fam-Trastuzumab Deruxtecan-nxki  (Enhertu ) (5.4) q21d         INTERVAL HISTORY:  The patient is a 73 y.o. female with metastatic her 2 Neu  receptor positive breast cancer, including a left suboccipital metastasis that was surgically resected in April 2022.  She also had a subcarinal lymph node for which scans have consistently shown a positive response to therapy. She comes in today to be evaluated before heading into her 49th cycle of Enhertu .  The patient claims to have tolerated her 48th cycle of treatment fairly well.  She still has left arm swelling over the past few weeks, for which she wears an elastic sleeve.  Otherwise, she denies having any symptoms/findings which concern her for overt signs of disease progression.     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  The patient received 6 cycles of Taxotere/Herceptin /Perjeta before being switched to maintenance Perjeta/Herceptin , for which she took 73 cycles of treatment.  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  This all happened in the setting of a prolonged treatment break from TDM-1.  Since recurrence was found, the patient has been on Enhertu  since May 2022.   Teresa [name] is here today for repeat clinical assessment. She denies fevers or chills. She denies pain. Her appetite is good. Her weight has been stable.  REVIEW OF SYSTEMS:  Review of Systems - Oncology   VITALS:   Blood pressure 108/73, pulse 72, temperature 98 F (36.7 C), temperature source Oral, height 5' 1 (1.549 m), weight 152 lb 1.6 oz (69 kg), SpO2 99%.  Wt Readings from Last 3 Encounters:  03/30/24 152 lb 1.6 oz (69 kg)  03/09/24 151 lb 12.8 oz (68.9 kg)  02/17/24 150 lb 4.8 oz (68.2 kg)    Body mass index is 28.74 kg/m.  Performance status (ECOG): 1 - Symptomatic but completely  ambulatory    PHYSICAL EXAM:  Physical Exam  LABS:      Latest Ref Rng & Units 03/30/2024    1:18 PM 03/09/2024    1:00 PM 02/17/2024   12:44 PM  CBC  WBC 4.0 - 10.5 K/uL 2.8  2.4  2.9   Hemoglobin 12.0 - 15.0 g/dL 16.1  09.6  04.5   Hematocrit 36.0 - 46.0 % 37.2  37.3  35.9   Platelets 150 - 400 K/uL 117  132  128       Latest Ref Rng & Units 03/09/2024    1:00 PM 02/17/2024   12:44 PM 01/27/2024    1:17 PM  CMP  Glucose 70 - 99 mg/dL 90  409  811   BUN 8 - 23 mg/dL 19  21  13    Creatinine 0.44 - 1.00 mg/dL 9.14  7.82  9.56   Sodium 135 - 145 mmol/L 142  140  141   Potassium 3.5 - 5.1 mmol/L 3.8  4.0  3.8   Chloride 98 - 111 mmol/L 108  108  106   CO2 22 - 32 mmol/L 24  24  24    Calcium 8.9 - 10.3 mg/dL 9.8  9.7  9.5   Total Protein 6.5 - 8.1 g/dL 7.5  7.2  7.1   Total Bilirubin 0.0 - 1.2 mg/dL 0.4  0.4  0.4   Alkaline Phos 38 - 126 U/L 124  110  113   AST 15 - 41 U/L 45  40  47   ALT 0 - 44 U/L 34  26  30      No results found for: CEA1, CEA / No results found for: CEA1, CEA No results found for: PSA1 No results found for: OZH086 No results found for: VHQ469  No results found for: TOTALPROTELP, ALBUMINELP, A1GS, A2GS, BETS, BETA2SER, GAMS, MSPIKE, SPEI Lab Results  Component Value Date   TIBC 328 03/08/2023   TIBC 410 06/26/2022   FERRITIN 22 03/08/2023   FERRITIN 7 (L) 06/26/2022   IRONPCTSAT 6 (L) 03/08/2023   IRONPCTSAT 5 (L) 06/26/2022   No results found for: LDH  STUDIES:  No results found.    HISTORY:   Past Medical History:  Diagnosis Date   Arthritis    Lung cancer (HCC) 2015    Past Surgical History:  Procedure Laterality Date   APPLICATION OF CRANIAL NAVIGATION N/A 02/06/2021   Procedure: APPLICATION OF CRANIAL NAVIGATION;  Surgeon: Garry Kansas, MD;  Location: [facility] OR;  Service: Neurosurgery;  Laterality: N/A;   BREAST LUMPECTOMY Left 2015   BREAST SURGERY Left    tumor removal   CRANIOTOMY N/A  02/06/2021   Procedure: Suboccipital Craniectomy for Tumor Resection with Brainlab Navigation;  Surgeon: Garry Kansas, MD;  Location[facility] OR;  Service: Neurosurgery;  Laterality: N/A;   TUBAL LIGATION      No family history on file.  Social History:  reports that she has quit smoking. Her  smoking use included cigarettes. She has never used smokeless tobacco. She reports that she does not currently use alcohol. She reports that she does not use drugs.The patient is alone  today.  Allergies: No Known Allergies  Current Medications: Current Outpatient Medications  Medication Sig Dispense Refill   acetaminophen  (TYLENOL ) 325 MG tablet Take 2 tablets (650 mg total) by mouth every 4 (four) hours as needed for mild pain (temp > 100.5).     benzonatate (TESSALON) 200 MG capsule Take 200 mg by mouth 3 (three) times daily as needed for cough.     meclizine  (ANTIVERT ) 12.5 MG tablet Take 1 tablet (12.5 mg total) by mouth 2 (two) times daily as needed for dizziness. 30 tablet 0   omeprazole (PRILOSEC) 40 MG capsule Take 40 mg by mouth every morning.     ondansetron  (ZOFRAN ) 4 MG tablet Take 1 tablet (4 mg total) by mouth every 4 (four) hours as needed for nausea. 90 tablet 3   Potassium 99 MG TABS Take 1 tablet (99 mg total) by mouth daily after lunch. 20 tablet 0   prochlorperazine  (COMPAZINE ) 10 MG tablet Take 1 tablet (10 mg total) by mouth every 6 (six) hours as needed for nausea or vomiting. 90 tablet 3   albuterol (VENTOLIN HFA) 108 (90 Base) MCG/ACT inhaler Inhale 2 puffs into the lungs every 6 (six) hours as needed for shortness of breath or wheezing.     No current facility-administered medications for this visit.

## 2024-03-30 NOTE — Patient Instructions (Signed)
 Fam-Trastuzumab Deruxtecan Injection What is this medication? FAM-TRASTUZUMAB DERUXTECAN (fam-tras TOOZ eu mab DER ux TEE kan) treats some types of cancer. It works by blocking a protein that causes cancer cells to grow and multiply. This helps to slow or stop the spread of cancer cells. This medicine may be used for other purposes; ask your health care provider or pharmacist if you have questions. COMMON BRAND NAME(S): ENHERTU What should I tell my care team before I take this medication? They need to know if you have any of these conditions: Heart disease Heart failure Infection, especially a viral infection, such as chickenpox, cold sores, or herpes Liver disease Lung or breathing disease, such as asthma or COPD An unusual or allergic reaction to fam-trastuzumab deruxtecan, other medications, foods, dyes, or preservatives Pregnant or trying to get pregnant Breast-feeding How should I use this medication? This medication is injected into a vein. It is given by your care team in a hospital or clinic setting. A special MedGuide will be given to you before each treatment. Be sure to read this information carefully each time. Talk to your care team about the use of this medication in children. Special care may be needed. Overdosage: If you think you have taken too much of this medicine contact a poison control center or emergency room at once. NOTE: This medicine is only for you. Do not share this medicine with others. What if I miss a dose? It is important not to miss your dose. Call your care team if you are unable to keep an appointment. What may interact with this medication? Interactions are not expected. This list may not describe all possible interactions. Give your health care provider a list of all the medicines, herbs, non-prescription drugs, or dietary supplements you use. Also tell them if you smoke, drink alcohol, or use illegal drugs. Some items may interact with your  medicine. What should I watch for while using this medication? Visit your care team for regular checks on your progress. Tell your care team if your symptoms do not start to get better or if they get worse. This medication may increase your risk of getting an infection. Call your care team for advice if you get a fever, chills, sore throat, or other symptoms of a cold or flu. Do not treat yourself. Try to avoid being around people who are sick. Avoid taking medications that contain aspirin, acetaminophen, ibuprofen, naproxen, or ketoprofen unless instructed by your care team. These medications may hide a fever. Be careful brushing or flossing your teeth or using a toothpick because you may get an infection or bleed more easily. If you have any dental work done, tell your dentist you are receiving this medication. This medication may cause dry eyes and blurred vision. If you wear contact lenses, you may feel some discomfort. Lubricating eye drops may help. See your care team if the problem does not go away or is severe. Talk to your care team if you may be pregnant. Serious birth defects can occur if you take this medication during pregnancy and for 7 months after the last dose. If your partner can get pregnant, use a condom during sex while taking this medication and for 4 months after the last dose. Do not breastfeed while taking this medication and for 7 months after the last dose. This medication may cause infertility. Talk to your care team if you are concerned about your fertility. What side effects may I notice from receiving this medication? Side effects  that you should report to your care team as soon as possible: Allergic reactions--skin rash, itching, hives, swelling of the face, lips, tongue, or throat Dry cough, shortness of breath or trouble breathing Infection--fever, chills, cough, sore throat, wounds that don't heal, pain or trouble when passing urine, general feeling of discomfort or  being unwell Heart failure--shortness of breath, swelling of the ankles, feet, or hands, sudden weight gain, unusual weakness or fatigue Unusual bruising or bleeding Side effects that usually do not require medical attention (report these to your care team if they continue or are bothersome): Constipation Diarrhea Hair loss Muscle pain Nausea Vomiting This list may not describe all possible side effects. Call your doctor for medical advice about side effects. You may report side effects to FDA at 1-800-FDA-1088. Where should I keep my medication? This medication is given in a hospital or clinic. It will not be stored at home. NOTE: This sheet is a summary. It may not cover all possible information. If you have questions about this medicine, talk to your doctor, pharmacist, or health care provider.  2024 Elsevier/Gold Standard (2023-06-14 00:00:00)

## 2024-04-13 ENCOUNTER — Encounter: Payer: Self-pay | Admitting: Oncology

## 2024-04-16 ENCOUNTER — Other Ambulatory Visit: Payer: Self-pay | Admitting: Oncology

## 2024-04-16 DIAGNOSIS — Z171 Estrogen receptor negative status [ER-]: Secondary | ICD-10-CM

## 2024-04-19 NOTE — Progress Notes (Unsigned)
 [facility]  [address] [place],  [place]  [address] [phone]  Clinic Day:  04/20/2024  Referring physician: Buren Rock HERO, MD   HISTORY OF PRESENT ILLNESS:  The patient is a 73 y.o. 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  She also had a subcarinal lymph node for which scans have consistently shown a positive response to therapy. She comes in today to be evaluated before heading into her 50th cycle of Enhertu .  The patient claims to have tolerated her 49th cycle of treatment fairly well.  However, she has had sinus problems, as where as right ear drainage that concerns her for otitis media.  She requests antibiotics for this today.  Also, based upon these findings, she wishes to delay her completely cycle of treatment until these issues have dissipated.  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  The patient received 6 cycles of Taxotere/Herceptin /Perjeta before being switched to maintenance Perjeta/Herceptin , for which she took 73 cycles of treatment.  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  This all happened in the setting of a prolonged treatment break from TDM-1.  Since this recurrence was found, the patient has been on Enhertu  since May 2022.   PHYSICAL EXAM:  Blood pressure 119/80, pulse 66, temperature 98.1 F (36.7 C), temperature source Oral, resp. rate 16, height 5' 1 (1.549 m), weight 150 lb 11.2 oz (68.4 kg), SpO2 99%. Wt Readings from Last 3 Encounters:  04/20/24 150 lb 11.2 oz (68.4 kg)  03/30/24  152 lb 1.6 oz (69 kg)  03/09/24 151 lb 12.8 oz (68.9 kg)   Body mass index is 28.47 kg/m. Performance status (ECOG): 1 - Symptomatic but completely ambulatory Physical Exam Constitutional:      Appearance: Normal appearance. She is not ill-appearing.  HENT:     Mouth/Throat:     Mouth: Mucous membranes are moist.     Pharynx: Oropharynx is clear. No oropharyngeal exudate or posterior oropharyngeal erythema.   Cardiovascular:     Rate and Rhythm: Normal rate and regular rhythm.     Heart sounds: No murmur heard.    No friction rub. No gallop.  Pulmonary:     Effort: Pulmonary effort is normal. No respiratory distress.     Breath sounds: Normal breath sounds. No wheezing, rhonchi or rales.  Abdominal:     General: Bowel sounds are normal. There is no distension.     Palpations: Abdomen is soft. There is no mass.     Tenderness: There is no abdominal tenderness.   Musculoskeletal:        General: No swelling.     Left upper arm: Swelling present.     Left forearm: Swelling present.     Right lower leg: No edema.     Left lower leg: No edema.  Lymphadenopathy:     Cervical: No cervical adenopathy.     Upper Body:     Right upper body: No supraclavicular or axillary adenopathy.     Left upper body: No supraclavicular or axillary adenopathy.     Lower Body: No  right inguinal adenopathy. No left inguinal adenopathy.   Skin:    General: Skin is warm.     Coloration: Skin is not jaundiced.     Findings: No lesion or rash.   Neurological:     General: No focal deficit present.     Mental Status: She is alert and oriented to person, place, and time. Mental status is at baseline.   Psychiatric:        Mood and Affect: Mood normal.        Behavior: Behavior normal.        Thought Content: Thought content normal.    LABS:      Latest Ref Rng & Units 04/20/2024    1:33 PM 03/30/2024    1:18 PM 03/09/2024    1:00 PM  CBC  WBC 4.0 - 10.5 K/uL 4.4  2.8  2.4   Hemoglobin 12.0  - 15.0 g/dL 88.7  87.8  87.3   Hematocrit 36.0 - 46.0 % 34.3  37.2  37.3   Platelets 150 - 400 K/uL 143  117  132       Latest Ref Rng & Units 04/20/2024    1:33 PM 03/30/2024    1:18 PM 03/09/2024    1:00 PM  CMP  Glucose 70 - 99 mg/dL 879  897  90   BUN 8 - 23 mg/dL 13  17  19    Creatinine 0.44 - 1.00 mg/dL 9.28  9.11  9.21   Sodium 135 - 145 mmol/L 140  142  142   Potassium 3.5 - 5.1 mmol/L 3.8  3.9  3.8   Chloride 98 - 111 mmol/L 108  109  108   CO2 22 - 32 mmol/L 23  23  24    Calcium 8.9 - 10.3 mg/dL 9.4  9.7  9.8   Total Protein 6.5 - 8.1 g/dL 7.3  7.2  7.5   Total Bilirubin 0.0 - 1.2 mg/dL 0.4  0.3  0.4   Alkaline Phos 38 - 126 U/L 142  115  124   AST 15 - 41 U/L 43  41  45   ALT 0 - 44 U/L 30  27  34    ASSESSMENT & PLAN:  Assessment/Plan:  A 73 y.o. 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  Due to her issues with sinusitis and otitis media, her 50th cycle of Enhertu  will be delayed for 1 week.  I will prescribe her azithromycin  500 mg daily x 7 days for her otitis media.  If this does not improve over time, she knows to speak with her primary care provider about considering additional treatment options for her otitis media.  Her 50th cycle of Enhertu  will be given on Monday, June 30th.  I will see her back in 3 weeks for repeat clinical assessment.  Repeat CT scans will be done before that visit to ascertain her new disease baseline after 50 cycles of Enhertu .  The patient understands all the plans discussed today and is in agreement with them.     Sarkis Rhines DELENA Kerns, MD

## 2024-04-20 ENCOUNTER — Other Ambulatory Visit: Payer: Self-pay

## 2024-04-20 ENCOUNTER — Inpatient Hospital Stay: Payer: Self-pay

## 2024-04-20 ENCOUNTER — Other Ambulatory Visit: Payer: Self-pay | Admitting: Oncology

## 2024-04-20 ENCOUNTER — Telehealth: Payer: Self-pay | Admitting: Oncology

## 2024-04-20 ENCOUNTER — Inpatient Hospital Stay (HOSPITAL_BASED_OUTPATIENT_CLINIC_OR_DEPARTMENT_OTHER): Payer: Self-pay | Admitting: Oncology

## 2024-04-20 VITALS — BP 119/80 | HR 66 | Temp 98.1°F | Resp 16 | Ht 61.0 in | Wt 150.7 lb

## 2024-04-20 DIAGNOSIS — Z171 Estrogen receptor negative status [ER-]: Secondary | ICD-10-CM

## 2024-04-20 DIAGNOSIS — C50412 Malignant neoplasm of upper-outer quadrant of left female breast: Secondary | ICD-10-CM

## 2024-04-20 LAB — CMP (CANCER CENTER ONLY)
ALT: 30 U/L (ref 0–44)
AST: 43 U/L — ABNORMAL HIGH (ref 15–41)
Albumin: 3.8 g/dL (ref 3.5–5.0)
Alkaline Phosphatase: 142 U/L — ABNORMAL HIGH (ref 38–126)
Anion gap: 9 (ref 5–15)
BUN: 13 mg/dL (ref 8–23)
CO2: 23 mmol/L (ref 22–32)
Calcium: 9.4 mg/dL (ref 8.9–10.3)
Chloride: 108 mmol/L (ref 98–111)
Creatinine: 0.71 mg/dL (ref 0.44–1.00)
GFR, Estimated: >60 mL/min (ref >60)
Glucose, Bld: 120 mg/dL — ABNORMAL HIGH (ref 70–99)
Potassium: 3.8 mmol/L (ref 3.5–5.1)
Sodium: 140 mmol/L (ref 135–145)
Total Bilirubin: 0.4 mg/dL (ref 0.0–1.2)
Total Protein: 7.3 g/dL (ref 6.5–8.1)

## 2024-04-20 LAB — CBC WITH DIFFERENTIAL (CANCER CENTER ONLY)
Abs Immature Granulocytes: 0.01 10*3/uL (ref 0.00–0.07)
Basophils Absolute: 0 10*3/uL (ref 0.0–0.1)
Basophils Relative: 0 %
Eosinophils Absolute: 0.1 10*3/uL (ref 0.0–0.5)
Eosinophils Relative: 3 %
HCT: 34.3 % — ABNORMAL LOW (ref 36.0–46.0)
Hemoglobin: 11.2 g/dL — ABNORMAL LOW (ref 12.0–15.0)
Immature Granulocytes: 0 %
Lymphocytes Relative: 18 %
Lymphs Abs: 0.8 10*3/uL (ref 0.7–4.0)
MCH: 31.1 pg (ref 26.0–34.0)
MCHC: 32.7 g/dL (ref 30.0–36.0)
MCV: 95.3 fL (ref 80.0–100.0)
Monocytes Absolute: 0.5 10*3/uL (ref 0.1–1.0)
Monocytes Relative: 11 %
Neutro Abs: 3 10*3/uL (ref 1.7–7.7)
Neutrophils Relative %: 68 %
Platelet Count: 143 10*3/uL — ABNORMAL LOW (ref 150–400)
RBC: 3.6 MIL/uL — ABNORMAL LOW (ref 3.87–5.11)
RDW: 14.1 % (ref 11.5–15.5)
WBC Count: 4.4 10*3/uL (ref 4.0–10.5)
nRBC: 0 % (ref 0.0–0.2)

## 2024-04-20 MED ORDER — AZITHROMYCIN 500 MG PO TABS: 500.0000 mg | ORAL_TABLET | Freq: Every day | ORAL | 0 refills | Status: AC

## 2024-04-20 NOTE — Telephone Encounter (Signed)
 Patient has been scheduled for follow-up visit per 04/20/24 LOS.  Pt given an appt calendar with date and time.     Transortation Requests sent to [place] [facility] for the DOS 04/27/24 & 05/18/24.

## 2024-04-21 ENCOUNTER — Other Ambulatory Visit: Payer: Self-pay

## 2024-04-21 ENCOUNTER — Encounter: Payer: Self-pay | Admitting: Oncology

## 2024-04-27 ENCOUNTER — Inpatient Hospital Stay: Payer: Self-pay

## 2024-04-27 VITALS — BP 129/79 | HR 83 | Temp 98.0°F | Resp 18

## 2024-04-27 DIAGNOSIS — Z171 Estrogen receptor negative status [ER-]: Secondary | ICD-10-CM

## 2024-04-27 MED ORDER — PALONOSETRON HCL INJECTION 0.25 MG/5ML
0.2500 mg | Freq: Once | INTRAVENOUS | Status: AC
  Administered 2024-04-27: 0.25 mg via INTRAVENOUS
  Filled 2024-04-27: qty 5

## 2024-04-27 MED ORDER — DEXTROSE 5 % IV SOLN: Freq: Once | INTRAVENOUS | Status: AC

## 2024-04-27 MED ORDER — DEXAMETHASONE SODIUM PHOSPHATE 10 MG/ML IJ SOLN
10.0000 mg | Freq: Once | INTRAMUSCULAR | Status: AC
  Administered 2024-04-27: 10 mg via INTRAVENOUS
  Filled 2024-04-27: qty 1

## 2024-04-27 MED ORDER — SODIUM CHLORIDE 0.9% FLUSH
10.0000 mL | INTRAVENOUS | Status: DC | PRN
  Administered 2024-04-27: 10 mL

## 2024-04-27 MED ORDER — ACETAMINOPHEN 325 MG PO TABS
650.0000 mg | ORAL_TABLET | Freq: Once | ORAL | Status: AC
  Administered 2024-04-27: 650 mg via ORAL
  Filled 2024-04-27: qty 2

## 2024-04-27 MED ORDER — FAM-TRASTUZUMAB DERUXTECAN-NXKI CHEMO 100 MG IV SOLR
3.6300 mg/kg | Freq: Once | INTRAVENOUS | Status: AC
  Administered 2024-04-27: 240 mg via INTRAVENOUS
  Filled 2024-04-27: qty 12

## 2024-04-27 MED ORDER — HEPARIN SOD (PORK) LOCK FLUSH 100 UNIT/ML IV SOLN
500.0000 [IU] | Freq: Once | INTRAVENOUS | Status: AC | PRN
  Administered 2024-04-27: 500 [IU]

## 2024-04-27 MED ORDER — DIPHENHYDRAMINE HCL 25 MG PO CAPS
50.0000 mg | ORAL_CAPSULE | Freq: Once | ORAL | Status: AC
  Administered 2024-04-27: 50 mg via ORAL
  Filled 2024-04-27: qty 2

## 2024-04-27 NOTE — Patient Instructions (Signed)
 Fam-Trastuzumab Deruxtecan Injection What is this medication? FAM-TRASTUZUMAB DERUXTECAN (fam-tras TOOZ eu mab DER ux TEE kan) treats some types of cancer. It works by blocking a protein that causes cancer cells to grow and multiply. This helps to slow or stop the spread of cancer cells. This medicine may be used for other purposes; ask your health care provider or pharmacist if you have questions. COMMON BRAND NAME(S): ENHERTU What should I tell my care team before I take this medication? They need to know if you have any of these conditions: Heart disease Heart failure Infection, especially a viral infection, such as chickenpox, cold sores, or herpes Liver disease Lung or breathing disease, such as asthma or COPD An unusual or allergic reaction to fam-trastuzumab deruxtecan, other medications, foods, dyes, or preservatives Pregnant or trying to get pregnant Breast-feeding How should I use this medication? This medication is injected into a vein. It is given by your care team in a hospital or clinic setting. A special MedGuide will be given to you before each treatment. Be sure to read this information carefully each time. Talk to your care team about the use of this medication in children. Special care may be needed. Overdosage: If you think you have taken too much of this medicine contact a poison control center or emergency room at once. NOTE: This medicine is only for you. Do not share this medicine with others. What if I miss a dose? It is important not to miss your dose. Call your care team if you are unable to keep an appointment. What may interact with this medication? Interactions are not expected. This list may not describe all possible interactions. Give your health care provider a list of all the medicines, herbs, non-prescription drugs, or dietary supplements you use. Also tell them if you smoke, drink alcohol, or use illegal drugs. Some items may interact with your  medicine. What should I watch for while using this medication? Visit your care team for regular checks on your progress. Tell your care team if your symptoms do not start to get better or if they get worse. This medication may increase your risk of getting an infection. Call your care team for advice if you get a fever, chills, sore throat, or other symptoms of a cold or flu. Do not treat yourself. Try to avoid being around people who are sick. Avoid taking medications that contain aspirin, acetaminophen, ibuprofen, naproxen, or ketoprofen unless instructed by your care team. These medications may hide a fever. Be careful brushing or flossing your teeth or using a toothpick because you may get an infection or bleed more easily. If you have any dental work done, tell your dentist you are receiving this medication. This medication may cause dry eyes and blurred vision. If you wear contact lenses, you may feel some discomfort. Lubricating eye drops may help. See your care team if the problem does not go away or is severe. Talk to your care team if you may be pregnant. Serious birth defects can occur if you take this medication during pregnancy and for 7 months after the last dose. If your partner can get pregnant, use a condom during sex while taking this medication and for 4 months after the last dose. Do not breastfeed while taking this medication and for 7 months after the last dose. This medication may cause infertility. Talk to your care team if you are concerned about your fertility. What side effects may I notice from receiving this medication? Side effects  that you should report to your care team as soon as possible: Allergic reactions--skin rash, itching, hives, swelling of the face, lips, tongue, or throat Dry cough, shortness of breath or trouble breathing Infection--fever, chills, cough, sore throat, wounds that don't heal, pain or trouble when passing urine, general feeling of discomfort or  being unwell Heart failure--shortness of breath, swelling of the ankles, feet, or hands, sudden weight gain, unusual weakness or fatigue Unusual bruising or bleeding Side effects that usually do not require medical attention (report these to your care team if they continue or are bothersome): Constipation Diarrhea Hair loss Muscle pain Nausea Vomiting This list may not describe all possible side effects. Call your doctor for medical advice about side effects. You may report side effects to FDA at 1-800-FDA-1088. Where should I keep my medication? This medication is given in a hospital or clinic. It will not be stored at home. NOTE: This sheet is a summary. It may not cover all possible information. If you have questions about this medicine, talk to your doctor, pharmacist, or health care provider.  2024 Elsevier/Gold Standard (2023-06-14 00:00:00)

## 2024-05-15 ENCOUNTER — Ambulatory Visit (HOSPITAL_BASED_OUTPATIENT_CLINIC_OR_DEPARTMENT_OTHER)
Admission: RE | Admit: 2024-05-15 | Discharge: 2024-05-15 | Disposition: A | Discharge status: Home / Self Care | Payer: Self-pay | Source: Ambulatory Visit | Attending: Oncology | Admitting: Oncology

## 2024-05-15 ENCOUNTER — Other Ambulatory Visit (HOSPITAL_BASED_OUTPATIENT_CLINIC_OR_DEPARTMENT_OTHER): Payer: Self-pay | Admitting: Radiology

## 2024-05-15 DIAGNOSIS — C50412 Malignant neoplasm of upper-outer quadrant of left female breast: Secondary | ICD-10-CM

## 2024-05-15 DIAGNOSIS — Z171 Estrogen receptor negative status [ER-]: Secondary | ICD-10-CM

## 2024-05-15 MED ORDER — IOHEXOL 300 MG/ML  SOLN
100.0000 mL | Freq: Once | INTRAMUSCULAR | Status: AC | PRN
Start: 2024-05-15 — End: 2024-05-15
  Administered 2024-05-15: 100 mL via INTRAVENOUS

## 2024-05-17 NOTE — Progress Notes (Unsigned)
 [facility]  [address] [place],  [place]  [address] [phone]  Clinic Day:  05/18/2024  Referring physician: [facility]   HISTORY OF PRESENT ILLNESS:  The patient is a 73 y.o. 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  She also had a subcarinal lymph node for which scans have consistently shown a positive response to therapy. She comes in today to go over her CT scans to ascertain her new disease baseline after receiving 50 cycles of Enhertu .  The patient claims to have tolerated her 50th cycle of treatment fairly well.  However, she is dealing with right-sided otalgia which really has not cleared upon after recent antibiotics.    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  The patient received 6 cycles of Taxotere/Herceptin /Perjeta before being switched to maintenance Perjeta/Herceptin , for which she took 73 cycles of treatment.  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  This all happened in the setting of a prolonged treatment break from TDM-1.  Since this recurrence was found, the patient has been on Enhertu  since May 2022.   PHYSICAL EXAM:  Blood pressure 105/67, pulse 70, temperature 98.2 F (36.8 C), temperature source Oral, resp. rate 14, height 5' 1 (1.549 m), weight 152 lb 4.8 oz (69.1 kg), SpO2 96%. Wt Readings from Last 3 Encounters:  05/18/24 152 lb 4.8 oz (69.1 kg)  04/20/24 150 lb 11.2 oz (68.4 kg)  03/30/24 152 lb 1.6 oz (69 kg)   Body mass index is 28.78 kg/m. Performance status  (ECOG): 1 - Symptomatic but completely ambulatory Physical Exam Constitutional:      Appearance: Normal appearance. She is not ill-appearing.  HENT:     Right Ear: There is impacted cerumen (not severe).     Mouth/Throat:     Mouth: Mucous membranes are moist.     Pharynx: Oropharynx is clear. No oropharyngeal exudate or posterior oropharyngeal erythema.  Cardiovascular:     Rate and Rhythm: Normal rate and regular rhythm.     Heart sounds: No murmur heard.    No friction rub. No gallop.  Pulmonary:     Effort: Pulmonary effort is normal. No respiratory distress.     Breath sounds: Normal breath sounds. No wheezing, rhonchi or rales.  Abdominal:     General: Bowel sounds are normal. There is no distension.     Palpations: Abdomen is soft. There is no mass.     Tenderness: There is no abdominal tenderness.  Musculoskeletal:        General: No swelling.     Left upper arm: Swelling present.     Left forearm: Swelling present.     Right lower leg: No edema.     Left lower leg: No edema.  Lymphadenopathy:     Cervical: No cervical adenopathy.     Upper Body:     Right upper body: No supraclavicular or axillary adenopathy.     Left upper body: No supraclavicular or axillary adenopathy.     Lower Body: No right inguinal adenopathy. No left inguinal adenopathy.  Skin:  General: Skin is warm.     Coloration: Skin is not jaundiced.     Findings: No lesion or rash.  Neurological:     General: No focal deficit present.     Mental Status: She is alert and oriented to person, place, and time. Mental status is at baseline.  Psychiatric:        Mood and Affect: Mood normal.        Behavior: Behavior normal.        Thought Content: Thought content normal.   SCANS:  CT scans of her chest/abdomen/pelvis revealed the following: FINDINGS: CT CHEST FINDINGS   Cardiovascular: Right IJ chest port. Port is accessed. Tip of the catheter extends to the central SVC. Heart is nonenlarged.  No pericardial effusion. Scattered vascular calcifications. Normal caliber thoracic aorta.   Mediastinum/Nodes: Large hiatal hernia. Slight wall thickening of the lower esophagus. Please correlate with any symptoms. Preserved thyroid gland. Surgical clips in left axillary region. No specific abnormal lymph node enlargement identified in the axillary region, hilum or mediastinum.   Lungs/Pleura: There is some linear opacity lung bases likely scar or atelectasis. No consolidation, pneumothorax or effusion. Few tiny lung nodules are again seen. Example includes right midlung measuring 4 mm on series 302 image 55, superior margin of the middle lobe. 3 mm focus at the right lung apex on image 20 is also stable. No new dominant lung nodule.   Musculoskeletal: Curvature and degenerative changes along the spine. Vertebral body fusion at T11-12 and partial at L1-2. Retrolisthesis seen of T12 on L1. Stable right-sided breast in a soft tissue nodule. Previously measured 18 mm and today 18 mm on image 20 of series 301. Please correlate with known history.   CT ABDOMEN PELVIS FINDINGS   Hepatobiliary: Stable multiple hepatic benign-appearing cystic lesions. Patent portal vein. Gallbladder has a tiny stone and is underdistended.   Pancreas: Unremarkable. No pancreatic ductal dilatation or surrounding inflammatory changes.   Spleen: Spleen measures 13.4 cm in length.   Adrenals/Urinary Tract: No enhancing renal mass. No collecting system dilatation. Numerous bilateral parapelvic renal cysts. Small Bosniak 1 right-sided parenchymal cyst as well. Preserved contour to the urinary bladder.   Stomach/Bowel: Large bowel has a normal course and caliber. Scattered colonic stool. Sigmoid colon diverticula. Small bowel is nondilated. Prominent proximal duodenal diverticulum. There is some terminal ileal small bowel stool appearance, nonspecific.   Vascular/Lymphatic: Aortic atherosclerosis. No  enlarged abdominal or pelvic lymph nodes.   Reproductive: Uterus and bilateral adnexa are unremarkable.   Other: No free air or free fluid. Stable small cystic area along the left inguinal region measuring 12 mm. Nonspecific. No free intra-abdominal air or free fluid.   Musculoskeletal: Scattered degenerative changes. Fusion of the right sacroiliac joint. Small sclerotic focus along the right femoral head could be a small bone island.   IMPRESSION: No significant interval change.   No developing new mass lesion, fluid collection or lymph node enlargement.   Large hiatal hernia.   Stable right-sided breast nodule.   Renal and hepatic benign-appearing cystic foci.   Stable small area of fluid in the left inguinal region.   LABS:      Latest Ref Rng & Units 05/18/2024    1:00 PM 04/20/2024    1:33 PM 03/30/2024    1:18 PM  CBC  WBC 4.0 - 10.5 K/uL 2.4  4.4  2.8   Hemoglobin 12.0 - 15.0 g/dL 89.1  88.7  87.8   Hematocrit 36.0 - 46.0 % 33.0  34.3  37.2   Platelets 150 - 400 K/uL 140  143  117       Latest Ref Rng & Units 05/18/2024    1:00 PM 04/20/2024    1:33 PM 03/30/2024    1:18 PM  CMP  Glucose 70 - 99 mg/dL 863  879  897   BUN 8 - 23 mg/dL 12  13  17    Creatinine 0.44 - 1.00 mg/dL 9.28  9.28  9.11   Sodium 135 - 145 mmol/L 140  140  142   Potassium 3.5 - 5.1 mmol/L 3.9  3.8  3.9   Chloride 98 - 111 mmol/L 108  108  109   CO2 22 - 32 mmol/L 23  23  23    Calcium 8.9 - 10.3 mg/dL 9.3  9.4  9.7   Total Protein 6.5 - 8.1 g/dL 7.2  7.3  7.2   Total Bilirubin 0.0 - 1.2 mg/dL 0.4  0.4  0.3   Alkaline Phos 38 - 126 U/L 118  142  115   AST 15 - 41 U/L 44  43  41   ALT 0 - 44 U/L 26  30  27     ASSESSMENT & PLAN:  Assessment/Plan:  A 73 y.o. 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  In clinic today, I went over all of her CT scan images with her, for which she could see that she remains disease-free while on Enhertu .  Understandably, the patient was  pleased with her CT scan images.  Based upon this, she will proceed with her 51st cycle of Enhertu  today.  Clinically, she is doing well.  I will see her back in 3 weeks before she heads into her 52nd cycle of Enhertu .  The patient understands all the plans discussed today and is in agreement with them.     Tylie Golonka DELENA Kerns, MD

## 2024-05-18 ENCOUNTER — Inpatient Hospital Stay: Payer: Self-pay | Attending: Oncology | Admitting: Oncology

## 2024-05-18 ENCOUNTER — Inpatient Hospital Stay: Payer: Self-pay

## 2024-05-18 ENCOUNTER — Encounter: Payer: Self-pay | Admitting: Oncology

## 2024-05-18 VITALS — BP 105/67 | HR 70 | Temp 98.2°F | Resp 14 | Ht 61.0 in | Wt 152.3 lb

## 2024-05-18 DIAGNOSIS — C771 Secondary and unspecified malignant neoplasm of intrathoracic lymph nodes: Secondary | ICD-10-CM | POA: Insufficient documentation

## 2024-05-18 DIAGNOSIS — C50412 Malignant neoplasm of upper-outer quadrant of left female breast: Secondary | ICD-10-CM

## 2024-05-18 DIAGNOSIS — C7931 Secondary malignant neoplasm of brain: Secondary | ICD-10-CM | POA: Insufficient documentation

## 2024-05-18 DIAGNOSIS — Z5112 Encounter for antineoplastic immunotherapy: Secondary | ICD-10-CM | POA: Insufficient documentation

## 2024-05-18 DIAGNOSIS — Z1731 Human epidermal growth factor receptor 2 positive status: Secondary | ICD-10-CM | POA: Insufficient documentation

## 2024-05-18 DIAGNOSIS — Z171 Estrogen receptor negative status [ER-]: Secondary | ICD-10-CM

## 2024-05-18 DIAGNOSIS — Z853 Personal history of malignant neoplasm of breast: Secondary | ICD-10-CM | POA: Insufficient documentation

## 2024-05-18 LAB — CMP (CANCER CENTER ONLY)
ALT: 26 U/L (ref 0–44)
AST: 44 U/L — ABNORMAL HIGH (ref 15–41)
Albumin: 3.5 g/dL (ref 3.5–5.0)
Alkaline Phosphatase: 118 U/L (ref 38–126)
Anion gap: 9 (ref 5–15)
BUN: 12 mg/dL (ref 8–23)
CO2: 23 mmol/L (ref 22–32)
Calcium: 9.3 mg/dL (ref 8.9–10.3)
Chloride: 108 mmol/L (ref 98–111)
Creatinine: 0.71 mg/dL (ref 0.44–1.00)
GFR, Estimated: >60 mL/min (ref >60)
Glucose, Bld: 136 mg/dL — ABNORMAL HIGH (ref 70–99)
Potassium: 3.9 mmol/L (ref 3.5–5.1)
Sodium: 140 mmol/L (ref 135–145)
Total Bilirubin: 0.4 mg/dL (ref 0.0–1.2)
Total Protein: 7.2 g/dL (ref 6.5–8.1)

## 2024-05-18 LAB — CBC WITH DIFFERENTIAL (CANCER CENTER ONLY)
Abs Immature Granulocytes: 0 K/uL (ref 0.00–0.07)
Basophils Absolute: 0 K/uL (ref 0.0–0.1)
Basophils Relative: 0 %
Eosinophils Absolute: 0.1 K/uL (ref 0.0–0.5)
Eosinophils Relative: 3 %
HCT: 33 % — ABNORMAL LOW (ref 36.0–46.0)
Hemoglobin: 10.8 g/dL — ABNORMAL LOW (ref 12.0–15.0)
Immature Granulocytes: 0 %
Lymphocytes Relative: 22 %
Lymphs Abs: 0.5 K/uL — ABNORMAL LOW (ref 0.7–4.0)
MCH: 30.9 pg (ref 26.0–34.0)
MCHC: 32.7 g/dL (ref 30.0–36.0)
MCV: 94.6 fL (ref 80.0–100.0)
Monocytes Absolute: 0.2 K/uL (ref 0.1–1.0)
Monocytes Relative: 9 %
Neutro Abs: 1.5 K/uL — ABNORMAL LOW (ref 1.7–7.7)
Neutrophils Relative %: 66 %
Platelet Count: 140 K/uL — ABNORMAL LOW (ref 150–400)
RBC: 3.49 MIL/uL — ABNORMAL LOW (ref 3.87–5.11)
RDW: 15 % (ref 11.5–15.5)
WBC Count: 2.4 K/uL — ABNORMAL LOW (ref 4.0–10.5)
nRBC: 0 % (ref 0.0–0.2)

## 2024-05-18 MED ORDER — SODIUM CHLORIDE 0.9% FLUSH
10.0000 mL | INTRAVENOUS | Status: DC | PRN
  Administered 2024-05-18: 10 mL

## 2024-05-18 MED ORDER — DEXTROSE 5 % IV SOLN: Freq: Once | INTRAVENOUS | Status: AC

## 2024-05-18 MED ORDER — HEPARIN SOD (PORK) LOCK FLUSH 100 UNIT/ML IV SOLN
500.0000 [IU] | Freq: Once | INTRAVENOUS | Status: AC | PRN
  Administered 2024-05-18: 500 [IU]

## 2024-05-18 MED ORDER — PALONOSETRON HCL INJECTION 0.25 MG/5ML
0.2500 mg | Freq: Once | INTRAVENOUS | Status: AC
  Administered 2024-05-18: 0.25 mg via INTRAVENOUS
  Filled 2024-05-18: qty 5

## 2024-05-18 MED ORDER — ACETAMINOPHEN 325 MG PO TABS
650.0000 mg | ORAL_TABLET | Freq: Once | ORAL | Status: AC
  Administered 2024-05-18: 650 mg via ORAL
  Filled 2024-05-18: qty 2

## 2024-05-18 MED ORDER — DEXAMETHASONE SODIUM PHOSPHATE 10 MG/ML IJ SOLN
10.0000 mg | Freq: Once | INTRAMUSCULAR | Status: AC
  Administered 2024-05-18: 10 mg via INTRAVENOUS
  Filled 2024-05-18: qty 1

## 2024-05-18 MED ORDER — DIPHENHYDRAMINE HCL 25 MG PO CAPS
50.0000 mg | ORAL_CAPSULE | Freq: Once | ORAL | Status: AC
Start: 2024-05-18 — End: 2024-05-18
  Administered 2024-05-18: 50 mg via ORAL
  Filled 2024-05-18: qty 2

## 2024-05-18 MED ORDER — FAM-TRASTUZUMAB DERUXTECAN-NXKI CHEMO 100 MG IV SOLR
3.6300 mg/kg | Freq: Once | INTRAVENOUS | Status: AC
  Administered 2024-05-18: 240 mg via INTRAVENOUS
  Filled 2024-05-18: qty 12

## 2024-05-18 NOTE — Patient Instructions (Signed)
 Fam-Trastuzumab Deruxtecan Injection What is this medication? FAM-TRASTUZUMAB DERUXTECAN (fam-tras TOOZ eu mab DER ux TEE kan) treats some types of cancer. It works by blocking a protein that causes cancer cells to grow and multiply. This helps to slow or stop the spread of cancer cells. This medicine may be used for other purposes; ask your health care provider or pharmacist if you have questions. COMMON BRAND NAME(S): ENHERTU What should I tell my care team before I take this medication? They need to know if you have any of these conditions: Heart disease Heart failure Infection, especially a viral infection, such as chickenpox, cold sores, or herpes Liver disease Lung or breathing disease, such as asthma or COPD An unusual or allergic reaction to fam-trastuzumab deruxtecan, other medications, foods, dyes, or preservatives Pregnant or trying to get pregnant Breast-feeding How should I use this medication? This medication is injected into a vein. It is given by your care team in a hospital or clinic setting. A special MedGuide will be given to you before each treatment. Be sure to read this information carefully each time. Talk to your care team about the use of this medication in children. Special care may be needed. Overdosage: If you think you have taken too much of this medicine contact a poison control center or emergency room at once. NOTE: This medicine is only for you. Do not share this medicine with others. What if I miss a dose? It is important not to miss your dose. Call your care team if you are unable to keep an appointment. What may interact with this medication? Interactions are not expected. This list may not describe all possible interactions. Give your health care provider a list of all the medicines, herbs, non-prescription drugs, or dietary supplements you use. Also tell them if you smoke, drink alcohol, or use illegal drugs. Some items may interact with your  medicine. What should I watch for while using this medication? Visit your care team for regular checks on your progress. Tell your care team if your symptoms do not start to get better or if they get worse. This medication may increase your risk of getting an infection. Call your care team for advice if you get a fever, chills, sore throat, or other symptoms of a cold or flu. Do not treat yourself. Try to avoid being around people who are sick. Avoid taking medications that contain aspirin, acetaminophen, ibuprofen, naproxen, or ketoprofen unless instructed by your care team. These medications may hide a fever. Be careful brushing or flossing your teeth or using a toothpick because you may get an infection or bleed more easily. If you have any dental work done, tell your dentist you are receiving this medication. This medication may cause dry eyes and blurred vision. If you wear contact lenses, you may feel some discomfort. Lubricating eye drops may help. See your care team if the problem does not go away or is severe. Talk to your care team if you may be pregnant. Serious birth defects can occur if you take this medication during pregnancy and for 7 months after the last dose. If your partner can get pregnant, use a condom during sex while taking this medication and for 4 months after the last dose. Do not breastfeed while taking this medication and for 7 months after the last dose. This medication may cause infertility. Talk to your care team if you are concerned about your fertility. What side effects may I notice from receiving this medication? Side effects  that you should report to your care team as soon as possible: Allergic reactions--skin rash, itching, hives, swelling of the face, lips, tongue, or throat Dry cough, shortness of breath or trouble breathing Infection--fever, chills, cough, sore throat, wounds that don't heal, pain or trouble when passing urine, general feeling of discomfort or  being unwell Heart failure--shortness of breath, swelling of the ankles, feet, or hands, sudden weight gain, unusual weakness or fatigue Unusual bruising or bleeding Side effects that usually do not require medical attention (report these to your care team if they continue or are bothersome): Constipation Diarrhea Hair loss Muscle pain Nausea Vomiting This list may not describe all possible side effects. Call your doctor for medical advice about side effects. You may report side effects to FDA at 1-800-FDA-1088. Where should I keep my medication? This medication is given in a hospital or clinic. It will not be stored at home. NOTE: This sheet is a summary. It may not cover all possible information. If you have questions about this medicine, talk to your doctor, pharmacist, or health care provider.  2024 Elsevier/Gold Standard (2023-06-14 00:00:00)

## 2024-05-19 ENCOUNTER — Other Ambulatory Visit: Payer: Self-pay

## 2024-05-19 NOTE — Progress Notes (Signed)
.  [facility] CSW Progress Note  Clinical Social Worker was consulted regarding status of [employer]. CSW reviewed documented. CSW did not receive [facility] Financial Assistance Application from patient's daughter. [facility] staff provided new application to patient's daughter and re-explained process. CSW sent referral to Patient Financial Specialist for ongoing monitoring.   Lizbeth Sprague, LCSW Clinical Social Worker [facility]

## 2024-05-26 ENCOUNTER — Telehealth: Payer: Self-pay | Admitting: Pharmacy Technician

## 2024-05-26 NOTE — Telephone Encounter (Signed)
 Spanish Interpretation provided by [employer], ID# [id number].  Interpreter called [phone] asked to speak with [name].  Person who answered phone stated that there was no one at the home by that name. Interpreter called the secondary number on file [phone] and was able to speak to patient.  Patient stated that the [phone] is the correct phone number.    Called patient to explain [place]'s financial assistance program.  Patient stated that she had already completed the application and her daughter returned it to [name] at [employer] along with the financial information.  I reached out to [place] and [employer] inquiring about the paperwork.  Neither [name] or [name] have received the financial application and supporting financial documents.  I made patient aware.  Patient requested that another financial application be mailed to her home.  I have mailed another Spanish application to patient to complete.  Patient also verbally acknowledged that she understood that she would need to provide a copy of her passport and notarized letter of support since she does not work or have any income.    After patient completes financial application, she is to return it along with requested financial documentation to [name] at [employer].    Teresa [name] Patient Services Navigator [facility]

## 2024-05-27 ENCOUNTER — Other Ambulatory Visit: Payer: Self-pay

## 2024-06-01 ENCOUNTER — Inpatient Hospital Stay: Payer: Self-pay | Admitting: Oncology

## 2024-06-01 ENCOUNTER — Inpatient Hospital Stay: Payer: Self-pay

## 2024-06-01 ENCOUNTER — Ambulatory Visit: Payer: Self-pay

## 2024-06-01 ENCOUNTER — Other Ambulatory Visit: Payer: Self-pay

## 2024-06-01 ENCOUNTER — Ambulatory Visit: Payer: Self-pay | Admitting: Oncology

## 2024-06-04 ENCOUNTER — Ambulatory Visit: Payer: Self-pay | Attending: Oncology

## 2024-06-04 ENCOUNTER — Telehealth: Payer: Self-pay | Admitting: Pharmacy Technician

## 2024-06-04 DIAGNOSIS — Z171 Estrogen receptor negative status [ER-]: Secondary | ICD-10-CM

## 2024-06-04 DIAGNOSIS — C50412 Malignant neoplasm of upper-outer quadrant of left female breast: Secondary | ICD-10-CM

## 2024-06-04 LAB — ECHOCARDIOGRAM COMPLETE
AR max vel: 2.05 cm2
AV Area VTI: 1.76 cm2
AV Area mean vel: 1.69 cm2
AV Mean grad: 5 mmHg
AV Peak grad: 7.2 mmHg
Ao pk vel: 1.34 m/s
Area-P 1/2: 2.62 cm2
MV VTI: 1.05 cm2
S' Lateral: 2.5 cm

## 2024-06-04 NOTE — Telephone Encounter (Signed)
 Patient provided [facility] financial application along with supporting documentation.  Sending to billing for processing.  Also, received signed and dated dated patient acknowledgement form.  Patient approved for the [employer].  Teresa [name] Patient Services Navigator [facility]

## 2024-06-04 NOTE — Telephone Encounter (Signed)
 Interpretation provided by [employer], ID# [id number] from [employer]. Made patient aware that she still has $890 left from [facility].  Educated patient on what classified as acceptable expenses, along with the availability of gas cards.  Per Interpreter-patient verbalized that she understood.  Dickey DOROTHA Fritter Patient Services Navigator [facility]

## 2024-06-07 NOTE — Progress Notes (Signed)
 [facility] at [facility] [address] [place],  [place]  [address] [phone]  Clinic Day:  06/08/2024  Referring physician: [facility]   HISTORY OF PRESENT ILLNESS:  The patient is a 73 y.o. 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  She also had a subcarinal lymph node for which scans have consistently shown a positive response to therapy. She comes in today to be evaluated before heading into her 52nd cycle of Enhertu .  The patient claims to have tolerated her 51st cycle of treatment fairly well.  Her main issue lately has been fatigue, which has been chronic in nature.  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  The patient received 6 cycles of Taxotere/Herceptin /Perjeta before being switched to maintenance Perjeta/Herceptin , for which she took 73 cycles of treatment.  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  This all happened in the setting of a prolonged treatment break from TDM-1.  Since this recurrence was found, the patient has been on Enhertu  since May 2022.   PHYSICAL EXAM:  Blood pressure 123/71, pulse 64, temperature 97.8 F (36.6 C), temperature source Oral, resp. rate 14, height 5' 1 (1.549 m), weight 151 lb 3.2 oz (68.6 kg), SpO2 98%. Wt Readings from Last 3 Encounters:  06/08/24 151 lb 3.2 oz (68.6 kg)  05/18/24 152 lb 4.8 oz (69.1 kg)  04/20/24 150 lb 11.2 oz (68.4 kg)   Body mass index is 28.57 kg/m. Performance status (ECOG): 1 - Symptomatic but completely ambulatory Physical  Exam Constitutional:      Appearance: Normal appearance. She is not ill-appearing.  HENT:     Right Ear: There is impacted cerumen (not severe).     Mouth/Throat:     Mouth: Mucous membranes are moist.     Pharynx: Oropharynx is clear. No oropharyngeal exudate or posterior oropharyngeal erythema.  Cardiovascular:     Rate and Rhythm: Normal rate and regular rhythm.     Heart sounds: No murmur heard.    No friction rub. No gallop.  Pulmonary:     Effort: Pulmonary effort is normal. No respiratory distress.     Breath sounds: Normal breath sounds. No wheezing, rhonchi or rales.  Abdominal:     General: Bowel sounds are normal. There is no distension.     Palpations: Abdomen is soft. There is no mass.     Tenderness: There is no abdominal tenderness.  Musculoskeletal:        General: No swelling.     Left upper arm: Swelling present.     Left forearm: Swelling present.     Right lower leg: No edema.     Left lower leg: No edema.  Lymphadenopathy:     Cervical: No cervical adenopathy.     Upper Body:     Right upper body: No supraclavicular or axillary adenopathy.     Left upper body: No supraclavicular or axillary adenopathy.     Lower Body: No right inguinal adenopathy. No left inguinal adenopathy.  Skin:    General: Skin is warm.     Coloration: Skin  is not jaundiced.     Findings: No lesion or rash.  Neurological:     General: No focal deficit present.     Mental Status: She is alert and oriented to person, place, and time. Mental status is at baseline.  Psychiatric:        Mood and Affect: Mood normal.        Behavior: Behavior normal.        Thought Content: Thought content normal.    LABS:      Latest Ref Rng & Units 06/08/2024    1:19 PM 05/18/2024    1:00 PM 04/20/2024    1:33 PM  CBC  WBC 4.0 - 10.5 K/uL 2.7  2.4  4.4   Hemoglobin 12.0 - 15.0 g/dL 89.0  89.1  88.7   Hematocrit 36.0 - 46.0 % 33.6  33.0  34.3   Platelets 150 - 400 K/uL 133  140  143        Latest Ref Rng & Units 06/08/2024    1:19 PM 05/18/2024    1:00 PM 04/20/2024    1:33 PM  CMP  Glucose 70 - 99 mg/dL 892  863  879   BUN 8 - 23 mg/dL 14  12  13    Creatinine 0.44 - 1.00 mg/dL 9.28  9.28  9.28   Sodium 135 - 145 mmol/L 140  140  140   Potassium 3.5 - 5.1 mmol/L 4.0  3.9  3.8   Chloride 98 - 111 mmol/L 108  108  108   CO2 22 - 32 mmol/L 22  23  23    Calcium 8.9 - 10.3 mg/dL 9.4  9.3  9.4   Total Protein 6.5 - 8.1 g/dL 7.3  7.2  7.3   Total Bilirubin 0.0 - 1.2 mg/dL 0.4  0.4  0.4   Alkaline Phos 38 - 126 U/L 119  118  142   AST 15 - 41 U/L 42  44  43   ALT 0 - 44 U/L 29  26  30      Latest Reference Range & Units 06/08/24 13:19  Iron 28 - 170 ug/dL 46  UIBC ug/dL 719  TIBC 749 - 549 ug/dL 673  Saturation Ratios 10.4 - 31.8 % 14  Ferritin 11 - 307 ng/mL 56  Folate >5.9 ng/mL 16.3  Vitamin B12 180 - 914 pg/mL 374   ASSESSMENT & PLAN:  Assessment/Plan:  A 74 y.o. 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  She will proceed with her 52nd cycle of Enhertu  today.  Her labs today show mild pancytopenia, which is likely related to her Enhertu .  Labs today do not show any nutritional deficiencies.  If her fatigue worsens, I would consider decreasing her Enhertu  by another 20-25%.  Clinically, she is doing well.  I will see her back in 3 weeks before she heads into her 53rd cycle of Enhertu .  The patient understands all the plans discussed today and is in agreement with them.     Hermen Mario DELENA Kerns, MD

## 2024-06-08 ENCOUNTER — Inpatient Hospital Stay: Payer: Self-pay

## 2024-06-08 ENCOUNTER — Other Ambulatory Visit: Payer: Self-pay | Admitting: Hematology and Oncology

## 2024-06-08 ENCOUNTER — Other Ambulatory Visit (HOSPITAL_BASED_OUTPATIENT_CLINIC_OR_DEPARTMENT_OTHER): Payer: Self-pay

## 2024-06-08 ENCOUNTER — Inpatient Hospital Stay: Payer: Self-pay | Attending: Oncology

## 2024-06-08 ENCOUNTER — Other Ambulatory Visit: Payer: Self-pay | Admitting: Oncology

## 2024-06-08 ENCOUNTER — Other Ambulatory Visit: Payer: Self-pay

## 2024-06-08 ENCOUNTER — Encounter: Payer: Self-pay | Admitting: Oncology

## 2024-06-08 ENCOUNTER — Inpatient Hospital Stay (HOSPITAL_BASED_OUTPATIENT_CLINIC_OR_DEPARTMENT_OTHER): Payer: Self-pay | Admitting: Oncology

## 2024-06-08 DIAGNOSIS — D61818 Other pancytopenia: Secondary | ICD-10-CM | POA: Insufficient documentation

## 2024-06-08 DIAGNOSIS — C50919 Malignant neoplasm of unspecified site of unspecified female breast: Secondary | ICD-10-CM | POA: Insufficient documentation

## 2024-06-08 DIAGNOSIS — C7931 Secondary malignant neoplasm of brain: Secondary | ICD-10-CM | POA: Insufficient documentation

## 2024-06-08 DIAGNOSIS — C7802 Secondary malignant neoplasm of left lung: Secondary | ICD-10-CM | POA: Insufficient documentation

## 2024-06-08 DIAGNOSIS — Z171 Estrogen receptor negative status [ER-]: Secondary | ICD-10-CM

## 2024-06-08 DIAGNOSIS — C770 Secondary and unspecified malignant neoplasm of lymph nodes of head, face and neck: Secondary | ICD-10-CM | POA: Insufficient documentation

## 2024-06-08 DIAGNOSIS — C7801 Secondary malignant neoplasm of right lung: Secondary | ICD-10-CM | POA: Insufficient documentation

## 2024-06-08 DIAGNOSIS — Z1731 Human epidermal growth factor receptor 2 positive status: Secondary | ICD-10-CM | POA: Insufficient documentation

## 2024-06-08 DIAGNOSIS — R5383 Other fatigue: Secondary | ICD-10-CM | POA: Insufficient documentation

## 2024-06-08 DIAGNOSIS — Z5112 Encounter for antineoplastic immunotherapy: Secondary | ICD-10-CM | POA: Insufficient documentation

## 2024-06-08 DIAGNOSIS — C50412 Malignant neoplasm of upper-outer quadrant of left female breast: Secondary | ICD-10-CM

## 2024-06-08 LAB — CBC WITH DIFFERENTIAL (CANCER CENTER ONLY)
Abs Immature Granulocytes: 0 K/uL (ref 0.00–0.07)
Basophils Absolute: 0 K/uL (ref 0.0–0.1)
Basophils Relative: 0 %
Eosinophils Absolute: 0.1 K/uL (ref 0.0–0.5)
Eosinophils Relative: 2 %
HCT: 33.6 % — ABNORMAL LOW (ref 36.0–46.0)
Hemoglobin: 10.9 g/dL — ABNORMAL LOW (ref 12.0–15.0)
Immature Granulocytes: 0 %
Lymphocytes Relative: 20 %
Lymphs Abs: 0.5 K/uL — ABNORMAL LOW (ref 0.7–4.0)
MCH: 30.7 pg (ref 26.0–34.0)
MCHC: 32.4 g/dL (ref 30.0–36.0)
MCV: 94.6 fL (ref 80.0–100.0)
Monocytes Absolute: 0.3 K/uL (ref 0.1–1.0)
Monocytes Relative: 10 %
Neutro Abs: 1.9 K/uL (ref 1.7–7.7)
Neutrophils Relative %: 68 %
Platelet Count: 133 K/uL — ABNORMAL LOW (ref 150–400)
RBC: 3.55 MIL/uL — ABNORMAL LOW (ref 3.87–5.11)
RDW: 14.9 % (ref 11.5–15.5)
WBC Count: 2.7 K/uL — ABNORMAL LOW (ref 4.0–10.5)
nRBC: 0 % (ref 0.0–0.2)

## 2024-06-08 LAB — CMP (CANCER CENTER ONLY)
ALT: 29 U/L (ref 0–44)
AST: 42 U/L — ABNORMAL HIGH (ref 15–41)
Albumin: 3.7 g/dL (ref 3.5–5.0)
Alkaline Phosphatase: 119 U/L (ref 38–126)
Anion gap: 10 (ref 5–15)
BUN: 14 mg/dL (ref 8–23)
CO2: 22 mmol/L (ref 22–32)
Calcium: 9.4 mg/dL (ref 8.9–10.3)
Chloride: 108 mmol/L (ref 98–111)
Creatinine: 0.71 mg/dL (ref 0.44–1.00)
GFR, Estimated: >60 mL/min (ref >60)
Glucose, Bld: 107 mg/dL — ABNORMAL HIGH (ref 70–99)
Potassium: 4 mmol/L (ref 3.5–5.1)
Sodium: 140 mmol/L (ref 135–145)
Total Bilirubin: 0.4 mg/dL (ref 0.0–1.2)
Total Protein: 7.3 g/dL (ref 6.5–8.1)

## 2024-06-08 LAB — IRON AND TIBC
Iron: 46 ug/dL (ref 28–170)
Saturation Ratios: 14 % (ref 10.4–31.8)
TIBC: 326 ug/dL (ref 250–450)
UIBC: 280 ug/dL

## 2024-06-08 LAB — FERRITIN: Ferritin: 56 ng/mL (ref 11–307)

## 2024-06-08 LAB — VITAMIN B12: Vitamin B-12: 374 pg/mL (ref 180–914)

## 2024-06-08 LAB — FOLATE: Folate: 16.3 ng/mL (ref >5.9)

## 2024-06-08 MED ORDER — FAM-TRASTUZUMAB DERUXTECAN-NXKI CHEMO 100 MG IV SOLR
3.6300 mg/kg | Freq: Once | INTRAVENOUS | Status: AC
  Administered 2024-06-08 (×2): 240 mg via INTRAVENOUS
  Filled 2024-06-08: qty 12

## 2024-06-08 MED ORDER — DIPHENHYDRAMINE HCL 25 MG PO CAPS
50.0000 mg | ORAL_CAPSULE | Freq: Once | ORAL | Status: AC
  Administered 2024-06-08 (×2): 50 mg via ORAL
  Filled 2024-06-08: qty 2

## 2024-06-08 MED ORDER — PALONOSETRON HCL INJECTION 0.25 MG/5ML
0.2500 mg | Freq: Once | INTRAVENOUS | Status: AC
  Administered 2024-06-08 (×2): 0.25 mg via INTRAVENOUS
  Filled 2024-06-08: qty 5

## 2024-06-08 MED ORDER — SODIUM CHLORIDE 0.9% FLUSH
10.0000 mL | INTRAVENOUS | Status: DC | PRN
Start: 2024-06-08 — End: 2024-06-08
  Administered 2024-06-08 (×2): 10 mL

## 2024-06-08 MED ORDER — DEXAMETHASONE SODIUM PHOSPHATE 10 MG/ML IJ SOLN
10.0000 mg | Freq: Once | INTRAMUSCULAR | Status: AC
  Administered 2024-06-08 (×2): 10 mg via INTRAVENOUS
  Filled 2024-06-08: qty 1

## 2024-06-08 MED ORDER — OMEPRAZOLE 40 MG PO CPDR
40.0000 mg | DELAYED_RELEASE_CAPSULE | Freq: Every morning | ORAL | 3 refills | Status: AC
  Filled 2024-06-08: qty 30, 30d supply, fill #0

## 2024-06-08 MED ORDER — DEXTROSE 5 % IV SOLN: Freq: Once | INTRAVENOUS | Status: AC

## 2024-06-08 MED ORDER — ACETAMINOPHEN 325 MG PO TABS
650.0000 mg | ORAL_TABLET | Freq: Once | ORAL | Status: AC
  Administered 2024-06-08 (×2): 650 mg via ORAL
  Filled 2024-06-08: qty 2

## 2024-06-08 NOTE — Patient Instructions (Signed)
 Fam-Trastuzumab  Deruxtecan Injection Qu es este medicamento? El FAM-TRASTUZUMAB  DERUXTECAN trata algunos tipos de cncer. Acta bloqueando una protena que hace que las clulas cancerosas crezcan y se multipliquen. Esto ayuda a Neurosurgeon propagacin de las clulas cancerosas. Este medicamento puede ser utilizado para otros usos; si tiene alguna pregunta consulte con su proveedor de atencin mdica o con su farmacutico. MARCAS COMUNES: ENHERTU  Qu le debo informar a mi profesional de la salud antes de tomar este medicamento? Necesitan saber si usted presenta alguno de los [employer] o situaciones: Enfermedad cardiaca Insuficiencia cardiaca Infeccin, especialmente infecciones virales, tales como varicela, fuegos labiales o herpes Enfermedad heptica Enfermedad pulmonar o respiratoria, tales como asma o EPOC Una reaccin alrgica o inusual al fam-trastuzumab  deruxtecan, a otros medicamentos, alimentos, colorantes o conservantes Si est embarazada o buscando quedar embarazada Si est amamantando a un beb Cmo debo utilizar este medicamento? Este medicamento se inyecta en una vena. Su equipo de atencin lo india en un hospital o en un entorno clnico. Se le entregar una Gua del medicamento (MedGuide, su nombre en ingls) especial antes de cada tratamiento. Asegrese de leer esta informacin cada vez cuidadosamente. Hable con su equipo de atencin sobre el uso de este medicamento en nios. Puede requerir atencin especial. Sobredosis: Pngase en contacto inmediatamente con un centro toxicolgico o una sala de urgencia si usted cree que haya tomado demasiado medicamento.<br>ATENCIN: [employer] es solo para usted. No comparta este medicamento con nadie. Qu sucede si me olvido de una dosis? Es importante no olvidar ninguna dosis. Llame a su equipo de atencin si no puede asistir a una cita. Qu puede interactuar con este medicamento? No se anticipan  interacciones. Puede ser que esta lista no menciona todas las posibles interacciones. Informe a su profesional de [employer] de [employer] productos a base de hierbas, medicamentos de [place] o suplementos nutritivos que est tomando. Si usted fuma, consume bebidas alcohlicas o si utiliza drogas ilegales, indqueselo tambin a su profesional de [employer]. Algunas sustancias pueden interactuar con su medicamento. A qu debo estar atento al usar [employer]? Visite a su equipo de atencin para que revise su evolucin peridicamente. Informe a su equipo de atencin si los sntomas no comienzan a mejorar o si empeoran. Este medicamento puede aumentar su riesgo de contraer una infeccin. Llame para pedir consejo a su equipo de atencin si tiene fiebre, escalofros, dolor de garganta o cualquier otro sntoma de resfriado o gripe. No se trate usted mismo. Trate de no acercarse a personas que estn enfermas. Evite usar medicamentos que contengan aspirina, acetaminofeno, ibuprofeno, naproxeno o ketoprofeno, a menos que as lo indique su equipo de atencin. Estos medicamentos pueden ocultar la fiebre. Proceda con cuidado al cepillar sus dientes, o al usar hilo dental o palillos para los dientes, ya que podra contraer una infeccin o Geophysicist/field seismologist con mayor facilidad. Si recibe algn tratamiento dental, informe a su dentista que est usando este medicamento. Este medicamento puede resecarle los ojos y provocar visin borrosa. Si usa  lentes de contacto, podra sentir ciertas molestias. Las gotas lubricantes para los ojos pueden ser tiles. Si el problema no desaparece o es grave, visite a su equipo de atencin. Hable con su equipo de atencin si podra estar en embarazo. Este medicamento puede causar defectos congnitos graves si se usa  durante el embarazo y por 7 meses despus de la ltima dosis. Si su pareja puede quedar embarazada, use un condn [employer] sexuales mientras est usando este medicamento  y  por 4 meses despus de la ltima dosis. No debe amamantar a un beb mientras usa  este medicamento y por 7 meses despus de la ltima dosis. Este medicamento podra causar infertilidad. Hable con su equipo de atencin si le preocupa su fertilidad. Qu efectos secundarios puedo tener al [employer] este medicamento? Efectos secundarios que debe informar a su equipo de atencin tan pronto como sea posible: Reacciones alrgicas: erupcin cutnea, comezn/picazn, urticaria, hinchazn de la cara, los labios, la lengua o la garganta Tos seca, falta de aire o problemas para respirar Infeccin: fiebre, escalofros, tos, dolor de garganta, heridas que no sanan, dolor o problemas para Geographical information systems officer, sensacin general de molestia o Sports administrator cardiaca: falta de aire, hinchazn de los tobillos, los pies o las [place], aumento de peso repentino, debilidad o fatiga inusuales Sangrado o moretones inusuales Efectos secundarios que generalmente no requieren atencin mdica (debe informarlos a su equipo de atencin si persisten o si son molestos): Estreimiento Diarrea Cada del cabello Dolor muscular Nuseas Vmito Puede ser que esta lista no menciona todos los posibles efectos secundarios. Comunquese a su mdico por asesoramiento mdico [employer]. Usted puede informar los efectos secundarios a la FDA por telfono al 1-800-FDA-1088. Dnde debo guardar mi medicina? Este medicamento se administra en hospitales o clnicas. No se guarda en su casa. ATENCIN: Este folleto es un resumen. Puede ser que no cubra toda la posible informacin. Si usted tiene preguntas acerca de esta medicina, consulte con su mdico, su farmacutico o su profesional de Radiographer, therapeutic.  2024 Elsevier/Gold Standard (2023-08-12 00:00:00)

## 2024-06-09 ENCOUNTER — Telehealth: Payer: Self-pay | Admitting: Oncology

## 2024-06-09 NOTE — Telephone Encounter (Signed)
 Patient has been scheduled for follow-up visit per 06/08/24 LOS.  Pt given an appt calendar with date and time.   Transportation request sent to [facility] FOR 06/30/24 and 07/20/24.

## 2024-06-22 ENCOUNTER — Ambulatory Visit: Payer: Self-pay | Admitting: Oncology

## 2024-06-22 ENCOUNTER — Other Ambulatory Visit: Payer: Self-pay

## 2024-06-22 ENCOUNTER — Ambulatory Visit: Payer: Self-pay

## 2024-06-29 NOTE — Progress Notes (Unsigned)
 [facility] [address] [place],  [place]  [address] [phone]  Clinic Day:  06/30/2024  Referring physician: Ezzard Valaria LABOR, MD   HISTORY OF PRESENT ILLNESS:  The patient is a 73 y.o. 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  She also had a subcarinal lymph node for which scans have consistently shown a positive response to therapy. She comes in today to be evaluated before heading into her 53rd cycle of Enhertu .  The patient claims to have tolerated her 52nd cycle of treatment fairly well.  She denies having any new symptoms or findings which concern her for disease recurrence.    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  The patient received 6 cycles of Taxotere/Herceptin /Perjeta before being switched to maintenance Perjeta/Herceptin , for which she took 73 cycles of treatment.  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  This all happened in the setting of a prolonged treatment break from TDM-1.  Since this recurrence was found, the patient has been on Enhertu  since May 2022.   PHYSICAL EXAM:  Blood pressure 116/83, pulse 66, temperature (!) 97.5 F (36.4 C), temperature source Oral, resp. rate 14, height 5' 1 (1.549 m), weight 153 lb (69.4 kg), SpO2 96%. Wt Readings from Last 3 Encounters:  06/30/24 153 lb (69.4 kg)  06/08/24 151 lb 3.2 oz (68.6 kg)  05/18/24 152 lb 4.8 oz (69.1 kg)   Body mass index is 28.91 kg/m. Performance status (ECOG): 1 - Symptomatic but completely ambulatory Physical  Exam Constitutional:      Appearance: Normal appearance. She is not ill-appearing.  HENT:     Right Ear: There is impacted cerumen (not severe).     Mouth/Throat:     Mouth: Mucous membranes are moist.     Pharynx: Oropharynx is clear. No oropharyngeal exudate or posterior oropharyngeal erythema.  Cardiovascular:     Rate and Rhythm: Normal rate and regular rhythm.     Heart sounds: No murmur heard.    No friction rub. No gallop.  Pulmonary:     Effort: Pulmonary effort is normal. No respiratory distress.     Breath sounds: Normal breath sounds. No wheezing, rhonchi or rales.  Abdominal:     General: Bowel sounds are normal. There is no distension.     Palpations: Abdomen is soft. There is no mass.     Tenderness: There is no abdominal tenderness.  Musculoskeletal:        General: No swelling.     Left upper arm: Swelling present.     Left forearm: Swelling present.     Right lower leg: No edema.     Left lower leg: No edema.  Lymphadenopathy:     Cervical: No cervical adenopathy.     Upper Body:     Right upper body: No supraclavicular or axillary adenopathy.     Left upper body: No supraclavicular or axillary adenopathy.     Lower Body: No right inguinal adenopathy. No left inguinal adenopathy.  Skin:    General: Skin is warm.     Coloration: Skin  is not jaundiced.     Findings: No lesion or rash.  Neurological:     General: No focal deficit present.     Mental Status: She is alert and oriented to person, place, and time. Mental status is at baseline.  Psychiatric:        Mood and Affect: Mood normal.        Behavior: Behavior normal.        Thought Content: Thought content normal.    LABS:      Latest Ref Rng & Units 06/30/2024    1:15 PM 06/08/2024    1:19 PM 05/18/2024    1:00 PM  CBC  WBC 4.0 - 10.5 K/uL 3.2  2.7  2.4   Hemoglobin 12.0 - 15.0 g/dL 88.4  89.0  89.1   Hematocrit 36.0 - 46.0 % 35.2  33.6  33.0   Platelets 150 - 400 K/uL 152  133  140        Latest Ref Rng & Units 06/30/2024    1:15 PM 06/08/2024    1:19 PM 05/18/2024    1:00 PM  CMP  Glucose 70 - 99 mg/dL 887  892  863   BUN 8 - 23 mg/dL 13  14  12    Creatinine 0.44 - 1.00 mg/dL 9.26  9.28  9.28   Sodium 135 - 145 mmol/L 140  140  140   Potassium 3.5 - 5.1 mmol/L 4.0  4.0  3.9   Chloride 98 - 111 mmol/L 107  108  108   CO2 22 - 32 mmol/L 24  22  23    Calcium 8.9 - 10.3 mg/dL 9.4  9.4  9.3   Total Protein 6.5 - 8.1 g/dL 7.4  7.3  7.2   Total Bilirubin 0.0 - 1.2 mg/dL 0.3  0.4  0.4   Alkaline Phos 38 - 126 U/L 116  119  118   AST 15 - 41 U/L 43  42  44   ALT 0 - 44 U/L 30  29  26      ASSESSMENT & PLAN:  Assessment/Plan:  A 73 y.o. 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  She will proceed with her 53rd cycle of Enhertu  today.  Clinically, she is doing well.  With respect to her persistent lymphedema, she will be referred to a local specialist to see if it can be improved.  Otherwise, I will see her back in 3 weeks before she heads into her 54th cycle of Enhertu .  The patient understands all the plans discussed today and is in agreement with them.     Keatyn Jawad DELENA Kerns, MD

## 2024-06-30 ENCOUNTER — Inpatient Hospital Stay (HOSPITAL_BASED_OUTPATIENT_CLINIC_OR_DEPARTMENT_OTHER): Payer: Self-pay | Admitting: Oncology

## 2024-06-30 ENCOUNTER — Inpatient Hospital Stay: Payer: Self-pay | Attending: Oncology

## 2024-06-30 ENCOUNTER — Encounter: Payer: Self-pay | Admitting: Oncology

## 2024-06-30 ENCOUNTER — Inpatient Hospital Stay: Payer: Self-pay

## 2024-06-30 VITALS — BP 116/83 | HR 66 | Temp 97.5°F | Resp 14 | Ht 61.0 in | Wt 153.0 lb

## 2024-06-30 DIAGNOSIS — C7931 Secondary malignant neoplasm of brain: Secondary | ICD-10-CM

## 2024-06-30 DIAGNOSIS — Z1731 Human epidermal growth factor receptor 2 positive status: Secondary | ICD-10-CM | POA: Insufficient documentation

## 2024-06-30 DIAGNOSIS — Z171 Estrogen receptor negative status [ER-]: Secondary | ICD-10-CM

## 2024-06-30 DIAGNOSIS — C50412 Malignant neoplasm of upper-outer quadrant of left female breast: Secondary | ICD-10-CM | POA: Insufficient documentation

## 2024-06-30 DIAGNOSIS — C50912 Malignant neoplasm of unspecified site of left female breast: Secondary | ICD-10-CM

## 2024-06-30 DIAGNOSIS — C770 Secondary and unspecified malignant neoplasm of lymph nodes of head, face and neck: Secondary | ICD-10-CM | POA: Insufficient documentation

## 2024-06-30 DIAGNOSIS — Z5112 Encounter for antineoplastic immunotherapy: Secondary | ICD-10-CM | POA: Insufficient documentation

## 2024-06-30 LAB — CMP (CANCER CENTER ONLY)
ALT: 30 U/L (ref 0–44)
AST: 43 U/L — ABNORMAL HIGH (ref 15–41)
Albumin: 3.8 g/dL (ref 3.5–5.0)
Alkaline Phosphatase: 116 U/L (ref 38–126)
Anion gap: 10 (ref 5–15)
BUN: 13 mg/dL (ref 8–23)
CO2: 24 mmol/L (ref 22–32)
Calcium: 9.4 mg/dL (ref 8.9–10.3)
Chloride: 107 mmol/L (ref 98–111)
Creatinine: 0.73 mg/dL (ref 0.44–1.00)
GFR, Estimated: >60 mL/min (ref >60)
Glucose, Bld: 112 mg/dL — ABNORMAL HIGH (ref 70–99)
Potassium: 4 mmol/L (ref 3.5–5.1)
Sodium: 140 mmol/L (ref 135–145)
Total Bilirubin: 0.3 mg/dL (ref 0.0–1.2)
Total Protein: 7.4 g/dL (ref 6.5–8.1)

## 2024-06-30 LAB — CBC WITH DIFFERENTIAL (CANCER CENTER ONLY)
Abs Immature Granulocytes: 0.01 K/uL (ref 0.00–0.07)
Basophils Absolute: 0 K/uL (ref 0.0–0.1)
Basophils Relative: 0 %
Eosinophils Absolute: 0.1 K/uL (ref 0.0–0.5)
Eosinophils Relative: 3 %
HCT: 35.2 % — ABNORMAL LOW (ref 36.0–46.0)
Hemoglobin: 11.5 g/dL — ABNORMAL LOW (ref 12.0–15.0)
Immature Granulocytes: 0 %
Lymphocytes Relative: 18 %
Lymphs Abs: 0.6 K/uL — ABNORMAL LOW (ref 0.7–4.0)
MCH: 30.5 pg (ref 26.0–34.0)
MCHC: 32.7 g/dL (ref 30.0–36.0)
MCV: 93.4 fL (ref 80.0–100.0)
Monocytes Absolute: 0.3 K/uL (ref 0.1–1.0)
Monocytes Relative: 10 %
Neutro Abs: 2.2 K/uL (ref 1.7–7.7)
Neutrophils Relative %: 69 %
Platelet Count: 152 K/uL (ref 150–400)
RBC: 3.77 MIL/uL — ABNORMAL LOW (ref 3.87–5.11)
RDW: 15.1 % (ref 11.5–15.5)
WBC Count: 3.2 K/uL — ABNORMAL LOW (ref 4.0–10.5)
nRBC: 0 % (ref 0.0–0.2)

## 2024-06-30 MED ORDER — DIPHENHYDRAMINE HCL 25 MG PO CAPS
50.0000 mg | ORAL_CAPSULE | Freq: Once | ORAL | Status: AC
  Administered 2024-06-30: 50 mg via ORAL
  Filled 2024-06-30: qty 2

## 2024-06-30 MED ORDER — PALONOSETRON HCL INJECTION 0.25 MG/5ML
0.2500 mg | Freq: Once | INTRAVENOUS | Status: AC
  Administered 2024-06-30: 0.25 mg via INTRAVENOUS
  Filled 2024-06-30: qty 5

## 2024-06-30 MED ORDER — DEXTROSE 5 % IV SOLN: Freq: Once | INTRAVENOUS | Status: AC

## 2024-06-30 MED ORDER — DEXAMETHASONE SODIUM PHOSPHATE 10 MG/ML IJ SOLN
10.0000 mg | Freq: Once | INTRAMUSCULAR | Status: AC
  Administered 2024-06-30: 10 mg via INTRAVENOUS
  Filled 2024-06-30: qty 1

## 2024-06-30 MED ORDER — ACETAMINOPHEN 325 MG PO TABS
650.0000 mg | ORAL_TABLET | Freq: Once | ORAL | Status: AC
  Administered 2024-06-30: 650 mg via ORAL
  Filled 2024-06-30: qty 2

## 2024-06-30 MED ORDER — FAM-TRASTUZUMAB DERUXTECAN-NXKI CHEMO 100 MG IV SOLR
3.6300 mg/kg | Freq: Once | INTRAVENOUS | Status: AC
  Administered 2024-06-30: 240 mg via INTRAVENOUS
  Filled 2024-06-30: qty 12

## 2024-06-30 NOTE — Patient Instructions (Signed)
 Fam-Trastuzumab  Deruxtecan Injection What is this medication? FAM-TRASTUZUMAB  DERUXTECAN (fam-tras TOOZ eu mab DER ux TEE kan) treats some types of cancer. It works by blocking a protein that causes cancer cells to grow and multiply. This helps to slow or stop the spread of cancer cells. This medicine may be used for other purposes; ask your health care provider or pharmacist if you have questions. COMMON BRAND NAME(S): ENHERTU  What should I tell my care team before I take this medication? They need to know if you have any of these conditions: Heart disease Heart failure Infection, especially a viral infection, such as chickenpox, cold sores, or herpes Liver disease Lung or breathing disease, such as asthma or COPD An unusual or allergic reaction to fam-trastuzumab  deruxtecan, other medications, foods, dyes, or preservatives Pregnant or trying to get pregnant Breast-feeding How should I use this medication? This medication is injected into a vein. It is given by your care team in a hospital or clinic setting. A special MedGuide will be given to you before each treatment. Be sure to read this information carefully each time. Talk to your care team about the use of this medication in children. Special care may be needed. Overdosage: If you think you have taken too much of this medicine contact a poison control center or emergency room at once. NOTE: This medicine is only for you. Do not share this medicine with others. What if I miss a dose? It is important not to miss your dose. Call your care team if you are unable to keep an appointment. What may interact with this medication? Interactions are not expected. This list may not describe all possible interactions. Give your health care provider a list of all the medicines, herbs, non-prescription drugs, or dietary supplements you use. Also tell them if you smoke, drink alcohol , or use illegal drugs. Some items may interact with your  medicine. What should I watch for while using this medication? Visit your care team for regular checks on your progress. Tell your care team if your symptoms do not start to get better or if they get worse. This medication may increase your risk of getting an infection. Call your care team for advice if you get a fever, chills, sore throat, or other symptoms of a cold or flu. Do not treat yourself. Try to avoid being around people who are sick. Avoid taking medications that contain aspirin, acetaminophen , ibuprofen, naproxen, or ketoprofen unless instructed by your care team. These medications may hide a fever. Be careful brushing or flossing your teeth or using a toothpick because you may get an infection or bleed more easily. If you have any dental work done, tell your dentist you are receiving this medication. This medication may cause dry eyes and blurred vision. If you wear contact lenses, you may feel some discomfort. Lubricating eye drops may help. See your care team if the problem does not go away or is severe. Talk to your care team if you may be pregnant. Serious birth defects can occur if you take this medication during pregnancy and for 7 months after the last dose. If your partner can get pregnant, use a condom during sex while taking this medication and for 4 months after the last dose. Do not breastfeed while taking this medication and for 7 months after the last dose. This medication may cause infertility. Talk to your care team if you are concerned about your fertility. What side effects may I notice from receiving this medication? Side effects  that you should report to your care team as soon as possible: Allergic reactions--skin rash, itching, hives, swelling of the face, lips, tongue, or throat Dry cough, shortness of breath or trouble breathing Infection--fever, chills, cough, sore throat, wounds that don't heal, pain or trouble when passing urine, general feeling of discomfort or  being unwell Heart failure--shortness of breath, swelling of the ankles, feet, or hands, sudden weight gain, unusual weakness or fatigue Unusual bruising or bleeding Side effects that usually do not require medical attention (report these to your care team if they continue or are bothersome): Constipation Diarrhea Hair loss Muscle pain Nausea Vomiting This list may not describe all possible side effects. Call your doctor for medical advice about side effects. You may report side effects to FDA at 1-800-FDA-1088. Where should I keep my medication? This medication is given in a hospital or clinic. It will not be stored at home. NOTE: This sheet is a summary. It may not cover all possible information. If you have questions about this medicine, talk to your doctor, pharmacist, or health care provider.  2024 Elsevier/Gold Standard (2023-06-14 00:00:00)

## 2024-07-19 ENCOUNTER — Other Ambulatory Visit: Payer: Self-pay

## 2024-07-19 NOTE — Progress Notes (Unsigned)
 [facility] at [facility] [address] [place],  [place]  [address] [phone]  Clinic Day:  07/20/2024  Referring physician: Ezzard Valaria LABOR, MD   HISTORY OF PRESENT ILLNESS:  The patient is a 73 y.o. 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  She also had a subcarinal lymph node for which scans have consistently shown a positive response to therapy. She comes in today to be evaluated before heading into her 54th cycle of Enhertu .  The patient claims to have tolerated her 53rd cycle of treatment fairly well.  However, the patient claims she has been dealing with an upper respiratory infection for the past 2+ weeks.  This includes her having a productive cough with green phlegm.  Despite using over-the-counter agents,  her upper respiratory symptoms have persisted.  As she does not feel particularly well today, she wishes to delay her 54th treatment for 1 week.   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  The patient received 6 cycles of Taxotere/Herceptin /Perjeta before being switched to maintenance Perjeta/Herceptin , for which she took 73 cycles of treatment.  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  This all happened in the setting of a prolonged treatment break from TDM-1.  Since this recurrence was found, the patient has been on Enhertu  since May 2022.   PHYSICAL EXAM:  Blood pressure 117/72, pulse 62, temperature 98.1 F (36.7 C), temperature source Oral, resp. rate 16, height 5' 1 (1.549 m), weight 149 lb 4.8 oz (67.7 kg), SpO2  95%. Wt Readings from Last 3 Encounters:  07/20/24 149 lb 4.8 oz (67.7 kg)  06/30/24 153 lb (69.4 kg)  06/08/24 151 lb 3.2 oz (68.6 kg)   Body mass index is 28.21 kg/m. Performance status (ECOG): 1 - Symptomatic but completely ambulatory Physical Exam Constitutional:      Appearance: Normal appearance. She is not ill-appearing.  HENT:     Right Ear: There is impacted cerumen (not severe).     Mouth/Throat:     Mouth: Mucous membranes are moist.     Pharynx: Oropharynx is clear. No oropharyngeal exudate or posterior oropharyngeal erythema.  Cardiovascular:     Rate and Rhythm: Normal rate and regular rhythm.     Heart sounds: No murmur heard.    No friction rub. No gallop.  Pulmonary:     Effort: Pulmonary effort is normal. No respiratory distress.     Breath sounds: Normal breath sounds. No wheezing, rhonchi or rales.  Abdominal:     General: Bowel sounds are normal. There is no distension.     Palpations: Abdomen is soft. There is no mass.     Tenderness: There is no abdominal tenderness.  Musculoskeletal:        General: No swelling.     Left upper arm: Swelling present.     Left forearm: Swelling present.     Right lower leg: No edema.     Left lower leg: No edema.  Lymphadenopathy:     Cervical: No cervical adenopathy.     Upper Body:     Right upper body:  No supraclavicular or axillary adenopathy.     Left upper body: No supraclavicular or axillary adenopathy.     Lower Body: No right inguinal adenopathy. No left inguinal adenopathy.  Skin:    General: Skin is warm.     Coloration: Skin is not jaundiced.     Findings: No lesion or rash.  Neurological:     General: No focal deficit present.     Mental Status: She is alert and oriented to person, place, and time. Mental status is at baseline.  Psychiatric:        Mood and Affect: Mood normal.        Behavior: Behavior normal.        Thought Content: Thought content normal.    LABS:      Latest Ref Rng &  Units 07/20/2024    1:11 PM 06/30/2024    1:15 PM 06/08/2024    1:19 PM  CBC  WBC 4.0 - 10.5 K/uL 2.4  3.2  2.7   Hemoglobin 12.0 - 15.0 g/dL 88.9  88.4  89.0   Hematocrit 36.0 - 46.0 % 33.8  35.2  33.6   Platelets 150 - 400 K/uL 190  152  133       Latest Ref Rng & Units 07/20/2024    1:11 PM 06/30/2024    1:15 PM 06/08/2024    1:19 PM  CMP  Glucose 70 - 99 mg/dL 94  887  892   BUN 8 - 23 mg/dL 14  13  14    Creatinine 0.44 - 1.00 mg/dL 9.25  9.26  9.28   Sodium 135 - 145 mmol/L 139  140  140   Potassium 3.5 - 5.1 mmol/L 3.8  4.0  4.0   Chloride 98 - 111 mmol/L 106  107  108   CO2 22 - 32 mmol/L 24  24  22    Calcium 8.9 - 10.3 mg/dL 9.4  9.4  9.4   Total Protein 6.5 - 8.1 g/dL 7.7  7.4  7.3   Total Bilirubin 0.0 - 1.2 mg/dL 0.3  0.3  0.4   Alkaline Phos 38 - 126 U/L 133  116  119   AST 15 - 41 U/L 41  43  42   ALT 0 - 44 U/L 29  30  29      ASSESSMENT & PLAN:  Assessment/Plan:  A 73 y.o. 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  Due to her upper respiratory infection, her 54th cycle of Enhertu  will be held.  I will prescribe her Levaquin  500 mg for 5 days to help clear her upper respiratory infection.  Tessalon  Perles will also be written to help ease her cough.  If she is doing better this time next week, she will proceed with her 54th cycle of Enhertu  at that time.  Otherwise, I will see her back 4 weeks from today before she heads into her 55th cycle of Enhertu .  The patient understands all the plans discussed today and is in agreement with them.     Nashton Belson DELENA Kerns, MD

## 2024-07-20 ENCOUNTER — Inpatient Hospital Stay: Payer: Self-pay

## 2024-07-20 ENCOUNTER — Encounter: Payer: Self-pay | Admitting: Oncology

## 2024-07-20 ENCOUNTER — Other Ambulatory Visit: Payer: Self-pay | Admitting: Oncology

## 2024-07-20 ENCOUNTER — Inpatient Hospital Stay (HOSPITAL_BASED_OUTPATIENT_CLINIC_OR_DEPARTMENT_OTHER): Payer: Self-pay | Admitting: Oncology

## 2024-07-20 ENCOUNTER — Telehealth: Payer: Self-pay | Admitting: Oncology

## 2024-07-20 VITALS — BP 117/72 | HR 62 | Temp 98.1°F | Resp 16 | Ht 61.0 in | Wt 149.3 lb

## 2024-07-20 DIAGNOSIS — C50412 Malignant neoplasm of upper-outer quadrant of left female breast: Secondary | ICD-10-CM

## 2024-07-20 DIAGNOSIS — Z170 Estrogen receptor positive status [ER+]: Secondary | ICD-10-CM

## 2024-07-20 LAB — CBC WITH DIFFERENTIAL (CANCER CENTER ONLY)
Abs Immature Granulocytes: 0.01 K/uL (ref 0.00–0.07)
Basophils Absolute: 0 K/uL (ref 0.0–0.1)
Basophils Relative: 0 %
Eosinophils Absolute: 0.1 K/uL (ref 0.0–0.5)
Eosinophils Relative: 6 %
HCT: 33.8 % — ABNORMAL LOW (ref 36.0–46.0)
Hemoglobin: 11 g/dL — ABNORMAL LOW (ref 12.0–15.0)
Immature Granulocytes: 0 %
Lymphocytes Relative: 27 %
Lymphs Abs: 0.6 K/uL — ABNORMAL LOW (ref 0.7–4.0)
MCH: 29.6 pg (ref 26.0–34.0)
MCHC: 32.5 g/dL (ref 30.0–36.0)
MCV: 91.1 fL (ref 80.0–100.0)
Monocytes Absolute: 0.4 K/uL (ref 0.1–1.0)
Monocytes Relative: 15 %
Neutro Abs: 1.2 K/uL — ABNORMAL LOW (ref 1.7–7.7)
Neutrophils Relative %: 52 %
Platelet Count: 190 K/uL (ref 150–400)
RBC: 3.71 MIL/uL — ABNORMAL LOW (ref 3.87–5.11)
RDW: 14.8 % (ref 11.5–15.5)
WBC Count: 2.4 K/uL — ABNORMAL LOW (ref 4.0–10.5)
nRBC: 0 % (ref 0.0–0.2)

## 2024-07-20 LAB — CMP (CANCER CENTER ONLY)
ALT: 29 U/L (ref 0–44)
AST: 41 U/L (ref 15–41)
Albumin: 3.4 g/dL — ABNORMAL LOW (ref 3.5–5.0)
Alkaline Phosphatase: 133 U/L — ABNORMAL HIGH (ref 38–126)
Anion gap: 10 (ref 5–15)
BUN: 14 mg/dL (ref 8–23)
CO2: 24 mmol/L (ref 22–32)
Calcium: 9.4 mg/dL (ref 8.9–10.3)
Chloride: 106 mmol/L (ref 98–111)
Creatinine: 0.74 mg/dL (ref 0.44–1.00)
GFR, Estimated: >60 mL/min (ref >60)
Glucose, Bld: 94 mg/dL (ref 70–99)
Potassium: 3.8 mmol/L (ref 3.5–5.1)
Sodium: 139 mmol/L (ref 135–145)
Total Bilirubin: 0.3 mg/dL (ref 0.0–1.2)
Total Protein: 7.7 g/dL (ref 6.5–8.1)

## 2024-07-20 MED ORDER — LEVOFLOXACIN 500 MG PO TABS: 500.0000 mg | ORAL_TABLET | Freq: Every day | ORAL | 0 refills | Status: AC

## 2024-07-20 MED ORDER — BENZONATATE 100 MG PO CAPS: 100.0000 mg | ORAL_CAPSULE | Freq: Three times a day (TID) | ORAL | 0 refills | Status: AC | PRN

## 2024-07-20 NOTE — Telephone Encounter (Signed)
 Patient has been scheduled for follow-up visit per 07/20/24 LOS.  Pt given an appt calendar with date and time.   Transportation request sent to [employer] via email for DOS 9/29 and 08/17/24.

## 2024-07-21 ENCOUNTER — Other Ambulatory Visit: Payer: Self-pay

## 2024-07-23 ENCOUNTER — Encounter: Payer: Self-pay | Admitting: Oncology

## 2024-07-27 ENCOUNTER — Encounter: Payer: Self-pay | Admitting: Oncology

## 2024-07-27 ENCOUNTER — Inpatient Hospital Stay: Payer: Self-pay

## 2024-07-27 ENCOUNTER — Other Ambulatory Visit: Payer: Self-pay

## 2024-07-27 VITALS — BP 122/67 | HR 67 | Temp 98.2°F | Resp 18 | Ht 61.0 in | Wt 150.1 lb

## 2024-07-27 DIAGNOSIS — Z171 Estrogen receptor negative status [ER-]: Secondary | ICD-10-CM

## 2024-07-27 LAB — CBC WITH DIFFERENTIAL (CANCER CENTER ONLY)
Abs Immature Granulocytes: 0.02 K/uL (ref 0.00–0.07)
Basophils Absolute: 0 K/uL (ref 0.0–0.1)
Basophils Relative: 0 %
Eosinophils Absolute: 0.2 K/uL (ref 0.0–0.5)
Eosinophils Relative: 5 %
HCT: 32.6 % — ABNORMAL LOW (ref 36.0–46.0)
Hemoglobin: 10.8 g/dL — ABNORMAL LOW (ref 12.0–15.0)
Immature Granulocytes: 1 %
Lymphocytes Relative: 15 %
Lymphs Abs: 0.6 K/uL — ABNORMAL LOW (ref 0.7–4.0)
MCH: 30.2 pg (ref 26.0–34.0)
MCHC: 33.1 g/dL (ref 30.0–36.0)
MCV: 91.1 fL (ref 80.0–100.0)
Monocytes Absolute: 0.3 K/uL (ref 0.1–1.0)
Monocytes Relative: 8 %
Neutro Abs: 2.8 K/uL (ref 1.7–7.7)
Neutrophils Relative %: 71 %
Platelet Count: 165 K/uL (ref 150–400)
RBC: 3.58 MIL/uL — ABNORMAL LOW (ref 3.87–5.11)
RDW: 15 % (ref 11.5–15.5)
WBC Count: 3.9 K/uL — ABNORMAL LOW (ref 4.0–10.5)
nRBC: 0 % (ref 0.0–0.2)

## 2024-07-27 MED ORDER — ACETAMINOPHEN 325 MG PO TABS
650.0000 mg | ORAL_TABLET | Freq: Once | ORAL | Status: AC
  Administered 2024-07-27: 650 mg via ORAL
  Filled 2024-07-27: qty 2

## 2024-07-27 MED ORDER — FAM-TRASTUZUMAB DERUXTECAN-NXKI CHEMO 100 MG IV SOLR
3.6300 mg/kg | Freq: Once | INTRAVENOUS | Status: AC
  Administered 2024-07-27: 240 mg via INTRAVENOUS
  Filled 2024-07-27: qty 12

## 2024-07-27 MED ORDER — SODIUM CHLORIDE 0.9% FLUSH: 10.0000 mL | INTRAVENOUS | Status: DC | PRN

## 2024-07-27 MED ORDER — DIPHENHYDRAMINE HCL 25 MG PO CAPS
50.0000 mg | ORAL_CAPSULE | Freq: Once | ORAL | Status: AC
  Administered 2024-07-27: 50 mg via ORAL
  Filled 2024-07-27: qty 2

## 2024-07-27 MED ORDER — DEXAMETHASONE SODIUM PHOSPHATE 10 MG/ML IJ SOLN
10.0000 mg | Freq: Once | INTRAMUSCULAR | Status: AC
  Administered 2024-07-27: 10 mg via INTRAVENOUS
  Filled 2024-07-27: qty 1

## 2024-07-27 MED ORDER — DEXTROSE 5 % IV SOLN: Freq: Once | INTRAVENOUS | Status: AC

## 2024-07-27 MED ORDER — PALONOSETRON HCL INJECTION 0.25 MG/5ML
0.2500 mg | Freq: Once | INTRAVENOUS | Status: AC
  Administered 2024-07-27: 0.25 mg via INTRAVENOUS
  Filled 2024-07-27: qty 5

## 2024-07-27 NOTE — Patient Instructions (Signed)
 Fam-Trastuzumab  Deruxtecan Injection What is this medication? FAM-TRASTUZUMAB  DERUXTECAN (fam-tras TOOZ eu mab DER ux TEE kan) treats some types of cancer. It works by blocking a protein that causes cancer cells to grow and multiply. This helps to slow or stop the spread of cancer cells. This medicine may be used for other purposes; ask your health care provider or pharmacist if you have questions. COMMON BRAND NAME(S): ENHERTU  What should I tell my care team before I take this medication? They need to know if you have any of these conditions: Heart disease Heart failure Infection, especially a viral infection, such as chickenpox, cold sores, or herpes Liver disease Lung or breathing disease, such as asthma or COPD An unusual or allergic reaction to fam-trastuzumab  deruxtecan, other medications, foods, dyes, or preservatives Pregnant or trying to get pregnant Breast-feeding How should I use this medication? This medication is injected into a vein. It is given by your care team in a hospital or clinic setting. A special MedGuide will be given to you before each treatment. Be sure to read this information carefully each time. Talk to your care team about the use of this medication in children. Special care may be needed. Overdosage: If you think you have taken too much of this medicine contact a poison control center or emergency room at once. NOTE: This medicine is only for you. Do not share this medicine with others. What if I miss a dose? It is important not to miss your dose. Call your care team if you are unable to keep an appointment. What may interact with this medication? Interactions are not expected. This list may not describe all possible interactions. Give your health care provider a list of all the medicines, herbs, non-prescription drugs, or dietary supplements you use. Also tell them if you smoke, drink alcohol , or use illegal drugs. Some items may interact with your  medicine. What should I watch for while using this medication? Visit your care team for regular checks on your progress. Tell your care team if your symptoms do not start to get better or if they get worse. This medication may increase your risk of getting an infection. Call your care team for advice if you get a fever, chills, sore throat, or other symptoms of a cold or flu. Do not treat yourself. Try to avoid being around people who are sick. Avoid taking medications that contain aspirin, acetaminophen , ibuprofen, naproxen, or ketoprofen unless instructed by your care team. These medications may hide a fever. Be careful brushing or flossing your teeth or using a toothpick because you may get an infection or bleed more easily. If you have any dental work done, tell your dentist you are receiving this medication. This medication may cause dry eyes and blurred vision. If you wear contact lenses, you may feel some discomfort. Lubricating eye drops may help. See your care team if the problem does not go away or is severe. Talk to your care team if you may be pregnant. Serious birth defects can occur if you take this medication during pregnancy and for 7 months after the last dose. If your partner can get pregnant, use a condom during sex while taking this medication and for 4 months after the last dose. Do not breastfeed while taking this medication and for 7 months after the last dose. This medication may cause infertility. Talk to your care team if you are concerned about your fertility. What side effects may I notice from receiving this medication? Side effects  that you should report to your care team as soon as possible: Allergic reactions--skin rash, itching, hives, swelling of the face, lips, tongue, or throat Dry cough, shortness of breath or trouble breathing Infection--fever, chills, cough, sore throat, wounds that don't heal, pain or trouble when passing urine, general feeling of discomfort or  being unwell Heart failure--shortness of breath, swelling of the ankles, feet, or hands, sudden weight gain, unusual weakness or fatigue Unusual bruising or bleeding Side effects that usually do not require medical attention (report these to your care team if they continue or are bothersome): Constipation Diarrhea Hair loss Muscle pain Nausea Vomiting This list may not describe all possible side effects. Call your doctor for medical advice about side effects. You may report side effects to FDA at 1-800-FDA-1088. Where should I keep my medication? This medication is given in a hospital or clinic. It will not be stored at home. NOTE: This sheet is a summary. It may not cover all possible information. If you have questions about this medicine, talk to your doctor, pharmacist, or health care provider.  2024 Elsevier/Gold Standard (2023-06-14 00:00:00)

## 2024-08-10 ENCOUNTER — Other Ambulatory Visit: Payer: Self-pay

## 2024-08-10 ENCOUNTER — Ambulatory Visit: Payer: Self-pay

## 2024-08-10 ENCOUNTER — Ambulatory Visit: Payer: Self-pay | Admitting: Oncology

## 2024-08-16 ENCOUNTER — Other Ambulatory Visit: Payer: Self-pay

## 2024-08-16 NOTE — Progress Notes (Unsigned)
 [facility] at [facility] [address] [place],  [place]  [address] [phone]  Clinic Day:  08/17/24  Referring physician: [facility]   HISTORY OF PRESENT ILLNESS:  The patient is a 73 y.o. 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  She also had a subcarinal lymph node for which scans have consistently shown a positive response to therapy. She comes in today to be evaluated before heading into her 55th cycle of Enhertu .  The patient claims to have tolerated her 54th cycle of treatment fairly well.  Her only complaint is progressive fatigue after each cycle of treatment.  She denies having any new symptoms/findings which concern her for disease recurrence.  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  The patient received 6 cycles of Taxotere/Herceptin /Perjeta before being switched to maintenance Perjeta/Herceptin , for which she took 73 cycles of treatment.  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  This happened in the setting of a prolonged treatment break from TDM-1.  Since this recurrence was found, the patient has been on Enhertu  since May 2022.   PHYSICAL EXAM:  Blood pressure 135/75, pulse 65, temperature (!) 97.5 F (36.4 C), temperature source Oral, resp. rate 16, height 5' 1 (1.549 m), weight 151 lb 11.2 oz (68.8 kg), SpO2 97%. Wt Readings from Last 3 Encounters:  08/17/24 151 lb 11.2 oz (68.8 kg)  07/27/24 150 lb 2 oz (68.1 kg)  07/20/24 149 lb 4.8 oz (67.7 kg)   Body mass index is 28.66 kg/m. Performance  status (ECOG): 1 - Symptomatic but completely ambulatory Physical Exam Constitutional:      Appearance: Normal appearance. She is not ill-appearing.  HENT:     Mouth/Throat:     Mouth: Mucous membranes are moist.     Pharynx: Oropharynx is clear. No oropharyngeal exudate or posterior oropharyngeal erythema.  Cardiovascular:     Rate and Rhythm: Normal rate and regular rhythm.     Heart sounds: No murmur heard.    No friction rub. No gallop.  Pulmonary:     Effort: Pulmonary effort is normal. No respiratory distress.     Breath sounds: Normal breath sounds. No wheezing, rhonchi or rales.  Abdominal:     General: Bowel sounds are normal. There is no distension.     Palpations: Abdomen is soft. There is no mass.     Tenderness: There is no abdominal tenderness.  Musculoskeletal:        General: No swelling.     Left upper arm: Swelling present.     Left forearm: Swelling present.     Right lower leg: No edema.     Left lower leg: No edema.  Lymphadenopathy:     Cervical: No cervical adenopathy.     Upper Body:     Right upper body: No supraclavicular or axillary adenopathy.     Left upper body: No supraclavicular or axillary adenopathy.     Lower Body: No right inguinal adenopathy. No left inguinal adenopathy.  Skin:    General: Skin is warm.     Coloration: Skin is  not jaundiced.     Findings: No lesion or rash.  Neurological:     General: No focal deficit present.     Mental Status: She is alert and oriented to person, place, and time. Mental status is at baseline.  Psychiatric:        Mood and Affect: Mood normal.        Behavior: Behavior normal.        Thought Content: Thought content normal.    LABS:      Latest Ref Rng & Units 08/17/2024    1:53 PM 07/27/2024    2:48 PM 07/20/2024    1:11 PM  CBC  WBC 4.0 - 10.5 K/uL 3.4  3.9  2.4   Hemoglobin 12.0 - 15.0 g/dL 88.5  89.1  88.9   Hematocrit 36.0 - 46.0 % 34.9  32.6  33.8   Platelets 150 - 400 K/uL 142  165  190        Latest Ref Rng & Units 08/17/2024    1:53 PM 07/20/2024    1:11 PM 06/30/2024    1:15 PM  CMP  Glucose 70 - 99 mg/dL 892  94  887   BUN 8 - 23 mg/dL 13  14  13    Creatinine 0.44 - 1.00 mg/dL 9.26  9.25  9.26   Sodium 135 - 145 mmol/L 140  139  140   Potassium 3.5 - 5.1 mmol/L 3.7  3.8  4.0   Chloride 98 - 111 mmol/L 108  106  107   CO2 22 - 32 mmol/L 24  24  24    Calcium 8.9 - 10.3 mg/dL 9.7  9.4  9.4   Total Protein 6.5 - 8.1 g/dL 7.6  7.7  7.4   Total Bilirubin 0.0 - 1.2 mg/dL 0.4  0.3  0.3   Alkaline Phos 38 - 126 U/L 114  133  116   AST 15 - 41 U/L 43  41  43   ALT 0 - 44 U/L 31  29  30      ASSESSMENT & PLAN:  Assessment/Plan:  A 73 y.o. 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  She will proceed with her 55th cycle Enhertu  today.  Clinically, the patient looks fine. I will see her back in 3 weeks to reassess her before she heads into a potential 56th cycle of Enhertu .  A repeat chest CT will be done before her next visit to ascertain her new disease baseline after 55 cycles of Enhertu .  The patient understands all the plans discussed today and is in agreement with them.     Kage Willmann DELENA Kerns, MD

## 2024-08-17 ENCOUNTER — Inpatient Hospital Stay: Payer: Self-pay | Attending: Oncology | Admitting: Oncology

## 2024-08-17 ENCOUNTER — Encounter: Payer: Self-pay | Admitting: Oncology

## 2024-08-17 ENCOUNTER — Inpatient Hospital Stay: Payer: Self-pay

## 2024-08-17 ENCOUNTER — Other Ambulatory Visit: Payer: Self-pay | Admitting: Oncology

## 2024-08-17 ENCOUNTER — Inpatient Hospital Stay: Payer: Self-pay | Attending: Oncology

## 2024-08-17 VITALS — BP 135/75 | HR 65 | Temp 97.5°F | Resp 16 | Ht 61.0 in | Wt 151.7 lb

## 2024-08-17 DIAGNOSIS — C7989 Secondary malignant neoplasm of other specified sites: Secondary | ICD-10-CM | POA: Insufficient documentation

## 2024-08-17 DIAGNOSIS — C7802 Secondary malignant neoplasm of left lung: Secondary | ICD-10-CM | POA: Insufficient documentation

## 2024-08-17 DIAGNOSIS — Z171 Estrogen receptor negative status [ER-]: Secondary | ICD-10-CM

## 2024-08-17 DIAGNOSIS — C7801 Secondary malignant neoplasm of right lung: Secondary | ICD-10-CM | POA: Insufficient documentation

## 2024-08-17 DIAGNOSIS — C50919 Malignant neoplasm of unspecified site of unspecified female breast: Secondary | ICD-10-CM | POA: Insufficient documentation

## 2024-08-17 DIAGNOSIS — Z1731 Human epidermal growth factor receptor 2 positive status: Secondary | ICD-10-CM | POA: Insufficient documentation

## 2024-08-17 DIAGNOSIS — Z5112 Encounter for antineoplastic immunotherapy: Secondary | ICD-10-CM | POA: Insufficient documentation

## 2024-08-17 DIAGNOSIS — Z170 Estrogen receptor positive status [ER+]: Secondary | ICD-10-CM

## 2024-08-17 DIAGNOSIS — C770 Secondary and unspecified malignant neoplasm of lymph nodes of head, face and neck: Secondary | ICD-10-CM | POA: Insufficient documentation

## 2024-08-17 DIAGNOSIS — C50412 Malignant neoplasm of upper-outer quadrant of left female breast: Secondary | ICD-10-CM

## 2024-08-17 LAB — CMP (CANCER CENTER ONLY)
ALT: 31 U/L (ref 0–44)
AST: 43 U/L — ABNORMAL HIGH (ref 15–41)
Albumin: 3.8 g/dL (ref 3.5–5.0)
Alkaline Phosphatase: 114 U/L (ref 38–126)
Anion gap: 8 (ref 5–15)
BUN: 13 mg/dL (ref 8–23)
CO2: 24 mmol/L (ref 22–32)
Calcium: 9.7 mg/dL (ref 8.9–10.3)
Chloride: 108 mmol/L (ref 98–111)
Creatinine: 0.73 mg/dL (ref 0.44–1.00)
GFR, Estimated: >60 mL/min (ref >60)
Glucose, Bld: 107 mg/dL — ABNORMAL HIGH (ref 70–99)
Potassium: 3.7 mmol/L (ref 3.5–5.1)
Sodium: 140 mmol/L (ref 135–145)
Total Bilirubin: 0.4 mg/dL (ref 0.0–1.2)
Total Protein: 7.6 g/dL (ref 6.5–8.1)

## 2024-08-17 LAB — CBC WITH DIFFERENTIAL (CANCER CENTER ONLY)
Abs Immature Granulocytes: 0 K/uL (ref 0.00–0.07)
Basophils Absolute: 0 K/uL (ref 0.0–0.1)
Basophils Relative: 0 %
Eosinophils Absolute: 0.1 K/uL (ref 0.0–0.5)
Eosinophils Relative: 3 %
HCT: 34.9 % — ABNORMAL LOW (ref 36.0–46.0)
Hemoglobin: 11.4 g/dL — ABNORMAL LOW (ref 12.0–15.0)
Immature Granulocytes: 0 %
Lymphocytes Relative: 18 %
Lymphs Abs: 0.6 K/uL — ABNORMAL LOW (ref 0.7–4.0)
MCH: 29.5 pg (ref 26.0–34.0)
MCHC: 32.7 g/dL (ref 30.0–36.0)
MCV: 90.4 fL (ref 80.0–100.0)
Monocytes Absolute: 0.3 K/uL (ref 0.1–1.0)
Monocytes Relative: 7 %
Neutro Abs: 2.5 K/uL (ref 1.7–7.7)
Neutrophils Relative %: 72 %
Platelet Count: 142 K/uL — ABNORMAL LOW (ref 150–400)
RBC: 3.86 MIL/uL — ABNORMAL LOW (ref 3.87–5.11)
RDW: 15.3 % (ref 11.5–15.5)
WBC Count: 3.4 K/uL — ABNORMAL LOW (ref 4.0–10.5)
nRBC: 0 % (ref 0.0–0.2)

## 2024-08-17 MED ORDER — DIPHENHYDRAMINE HCL 25 MG PO CAPS
50.0000 mg | ORAL_CAPSULE | Freq: Once | ORAL | Status: AC
  Administered 2024-08-17: 50 mg via ORAL
  Filled 2024-08-17: qty 2

## 2024-08-17 MED ORDER — DEXTROSE 5 % IV SOLN: Freq: Once | INTRAVENOUS | Status: AC

## 2024-08-17 MED ORDER — FAM-TRASTUZUMAB DERUXTECAN-NXKI CHEMO 100 MG IV SOLR
3.6300 mg/kg | Freq: Once | INTRAVENOUS | Status: AC
  Administered 2024-08-17: 240 mg via INTRAVENOUS
  Filled 2024-08-17: qty 12

## 2024-08-17 MED ORDER — DEXAMETHASONE SOD PHOSPHATE PF 10 MG/ML IJ SOLN
10.0000 mg | Freq: Once | INTRAMUSCULAR | Status: AC
  Administered 2024-08-17: 10 mg via INTRAVENOUS

## 2024-08-17 MED ORDER — ACETAMINOPHEN 325 MG PO TABS
650.0000 mg | ORAL_TABLET | Freq: Once | ORAL | Status: AC
  Administered 2024-08-17: 650 mg via ORAL
  Filled 2024-08-17: qty 2

## 2024-08-17 MED ORDER — PALONOSETRON HCL INJECTION 0.25 MG/5ML
0.2500 mg | Freq: Once | INTRAVENOUS | Status: AC
  Administered 2024-08-17: 0.25 mg via INTRAVENOUS
  Filled 2024-08-17: qty 5

## 2024-08-17 NOTE — Patient Instructions (Signed)
 Fam-Trastuzumab  Deruxtecan Injection What is this medication? FAM-TRASTUZUMAB  DERUXTECAN (fam-tras TOOZ eu mab DER ux TEE kan) treats some types of cancer. It works by blocking a protein that causes cancer cells to grow and multiply. This helps to slow or stop the spread of cancer cells. This medicine may be used for other purposes; ask your health care provider or pharmacist if you have questions. COMMON BRAND NAME(S): ENHERTU  What should I tell my care team before I take this medication? They need to know if you have any of these conditions: Heart disease Heart failure Infection, especially a viral infection, such as chickenpox, cold sores, or herpes Liver disease Lung or breathing disease, such as asthma or COPD An unusual or allergic reaction to fam-trastuzumab  deruxtecan, other medications, foods, dyes, or preservatives Pregnant or trying to get pregnant Breast-feeding How should I use this medication? This medication is injected into a vein. It is given by your care team in a hospital or clinic setting. A special MedGuide will be given to you before each treatment. Be sure to read this information carefully each time. Talk to your care team about the use of this medication in children. Special care may be needed. Overdosage: If you think you have taken too much of this medicine contact a poison control center or emergency room at once. NOTE: This medicine is only for you. Do not share this medicine with others. What if I miss a dose? It is important not to miss your dose. Call your care team if you are unable to keep an appointment. What may interact with this medication? Interactions are not expected. This list may not describe all possible interactions. Give your health care provider a list of all the medicines, herbs, non-prescription drugs, or dietary supplements you use. Also tell them if you smoke, drink alcohol , or use illegal drugs. Some items may interact with your  medicine. What should I watch for while using this medication? Visit your care team for regular checks on your progress. Tell your care team if your symptoms do not start to get better or if they get worse. This medication may increase your risk of getting an infection. Call your care team for advice if you get a fever, chills, sore throat, or other symptoms of a cold or flu. Do not treat yourself. Try to avoid being around people who are sick. Avoid taking medications that contain aspirin, acetaminophen , ibuprofen, naproxen, or ketoprofen unless instructed by your care team. These medications may hide a fever. Be careful brushing or flossing your teeth or using a toothpick because you may get an infection or bleed more easily. If you have any dental work done, tell your dentist you are receiving this medication. This medication may cause dry eyes and blurred vision. If you wear contact lenses, you may feel some discomfort. Lubricating eye drops may help. See your care team if the problem does not go away or is severe. Talk to your care team if you may be pregnant. Serious birth defects can occur if you take this medication during pregnancy and for 7 months after the last dose. If your partner can get pregnant, use a condom during sex while taking this medication and for 4 months after the last dose. Do not breastfeed while taking this medication and for 7 months after the last dose. This medication may cause infertility. Talk to your care team if you are concerned about your fertility. What side effects may I notice from receiving this medication? Side effects  that you should report to your care team as soon as possible: Allergic reactions--skin rash, itching, hives, swelling of the face, lips, tongue, or throat Dry cough, shortness of breath or trouble breathing Infection--fever, chills, cough, sore throat, wounds that don't heal, pain or trouble when passing urine, general feeling of discomfort or  being unwell Heart failure--shortness of breath, swelling of the ankles, feet, or hands, sudden weight gain, unusual weakness or fatigue Unusual bruising or bleeding Side effects that usually do not require medical attention (report these to your care team if they continue or are bothersome): Constipation Diarrhea Hair loss Muscle pain Nausea Vomiting This list may not describe all possible side effects. Call your doctor for medical advice about side effects. You may report side effects to FDA at 1-800-FDA-1088. Where should I keep my medication? This medication is given in a hospital or clinic. It will not be stored at home. NOTE: This sheet is a summary. It may not cover all possible information. If you have questions about this medicine, talk to your doctor, pharmacist, or health care provider.  2024 Elsevier/Gold Standard (2023-06-14 00:00:00)

## 2024-08-18 ENCOUNTER — Encounter: Payer: Self-pay | Admitting: Oncology

## 2024-08-19 ENCOUNTER — Telehealth: Payer: Self-pay | Admitting: Pharmacy Technician

## 2024-08-19 NOTE — Telephone Encounter (Signed)
 Submitted [facility] Financial application along with supporting financial documentation to billing for processing.  Patient will receive determination letter in the mail.  Teresa [name] Patient Pharmacologist [facility]

## 2024-08-20 ENCOUNTER — Telehealth: Payer: Self-pay | Admitting: Oncology

## 2024-08-20 ENCOUNTER — Encounter: Payer: Self-pay | Admitting: Oncology

## 2024-08-20 ENCOUNTER — Other Ambulatory Visit: Payer: Self-pay

## 2024-08-20 NOTE — Telephone Encounter (Signed)
 CT Chest has been scheduled for 09/04/24 @ 1 PM; Checking in @ 12:30 pm at [facility]   Notified pt of date,time, instructions and location.

## 2024-08-21 ENCOUNTER — Telehealth: Payer: Self-pay | Admitting: Oncology

## 2024-08-21 NOTE — Telephone Encounter (Signed)
 Transportation requests for DOS 09/04/24 and 09/07/24 sent to [employer] via email.

## 2024-09-04 ENCOUNTER — Inpatient Hospital Stay: Payer: Self-pay

## 2024-09-06 NOTE — Progress Notes (Unsigned)
 [facility] at [facility] [address] [place],  [place]  [address] [phone]  Clinic Day:  09/09/2024  Referring physician: [facility]   HISTORY OF PRESENT ILLNESS:  The patient is a 73 y.o. 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  She also had a subcarinal lymph node for which scans have consistently shown a positive response to therapy. She comes in today to go over her CT scans to ascertain her new disease baseline after receiving 55 cycles of Enhertu .  The patient claims to have tolerated her 54th cycle of treatment fairly well.  Her only complaint is progressive fatigue after each cycle of treatment.  She denies having any new symptoms/findings which concern her for disease recurrence.  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  The patient received 6 cycles of Taxotere/Herceptin /Perjeta before being switched to maintenance Perjeta/Herceptin , for which she took 73 cycles of treatment.  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  This happened in the setting of a prolonged treatment break from TDM-1.  Since this recurrence was found, the patient has been on Enhertu  since May 2022.   PHYSICAL EXAM:  Blood pressure 124/75, pulse 60, temperature (!) 97.4 F (36.3 C), temperature source Oral, resp. rate 14, height 5' 1 (1.549 m), weight 150 lb 14.4 oz (68.4 kg), SpO2 98%. Wt Readings from Last 3 Encounters:  09/07/24 150 lb (68 kg)  09/07/24 150 lb 14.4 oz (68.4 kg)  08/17/24 151 lb 11.2 oz (68.8 kg)   Body mass  index is 28.51 kg/m. Performance status (ECOG): 1 - Symptomatic but completely ambulatory Physical Exam Constitutional:      Appearance: Normal appearance. She is not ill-appearing.  HENT:     Mouth/Throat:     Mouth: Mucous membranes are moist.     Pharynx: Oropharynx is clear. No oropharyngeal exudate or posterior oropharyngeal erythema.  Cardiovascular:     Rate and Rhythm: Normal rate and regular rhythm.     Heart sounds: No murmur heard.    No friction rub. No gallop.  Pulmonary:     Effort: Pulmonary effort is normal. No respiratory distress.     Breath sounds: Normal breath sounds. No wheezing, rhonchi or rales.  Abdominal:     General: Bowel sounds are normal. There is no distension.     Palpations: Abdomen is soft. There is no mass.     Tenderness: There is no abdominal tenderness.  Musculoskeletal:        General: No swelling.     Left upper arm: Swelling present.     Left forearm: Swelling present.     Right lower leg: No edema.     Left lower leg: No edema.  Lymphadenopathy:     Cervical: No cervical adenopathy.     Upper Body:     Right upper body: No supraclavicular or axillary adenopathy.     Left upper body: No supraclavicular or axillary adenopathy.     Lower Body: No right inguinal adenopathy. No left inguinal adenopathy.  Skin:    General: Skin is warm.  Coloration: Skin is not jaundiced.     Findings: No lesion or rash.  Neurological:     General: No focal deficit present.     Mental Status: She is alert and oriented to person, place, and time. Mental status is at baseline.  Psychiatric:        Mood and Affect: Mood normal.        Behavior: Behavior normal.        Thought Content: Thought content normal.   SCANS:  Her chest CT from 09-04-2024 revealed the following:     LABS:      Latest Ref Rng & Units 09/07/2024    1:09 PM 08/17/2024    1:53 PM 07/27/2024    2:48 PM  CBC  WBC 4.0 - 10.5 K/uL 2.5  3.4  3.9   Hemoglobin 12.0 - 15.0 g/dL  88.8  88.5  89.1   Hematocrit 36.0 - 46.0 % 33.5  34.9  32.6   Platelets 150 - 400 K/uL 141  142  165       Latest Ref Rng & Units 09/07/2024    1:09 PM 08/17/2024    1:53 PM 07/20/2024    1:11 PM  CMP  Glucose 70 - 99 mg/dL 898  892  94   BUN 8 - 23 mg/dL 14  13  14    Creatinine 0.44 - 1.00 mg/dL 9.29  9.26  9.25   Sodium 135 - 145 mmol/L 141  140  139   Potassium 3.5 - 5.1 mmol/L 3.6  3.7  3.8   Chloride 98 - 111 mmol/L 108  108  106   CO2 22 - 32 mmol/L 24  24  24    Calcium 8.9 - 10.3 mg/dL 9.5  9.7  9.4   Total Protein 6.5 - 8.1 g/dL 7.3  7.6  7.7   Total Bilirubin 0.0 - 1.2 mg/dL 0.4  0.4  0.3   Alkaline Phos 38 - 126 U/L 109  114  133   AST 15 - 41 U/L 42  43  41   ALT 0 - 44 U/L 28  31  29      ASSESSMENT & PLAN:  Assessment/Plan:  A 73 y.o. 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  In clinic today, I went over all of her CT scan images with her, for which she could see that her disease remains under ideal control.  Based upon this, she will proceed with her 56th cycle Enhertu  today.  Clinically, the patient looks fine. I will see her back in 3 weeks to reassess her before she heads into a potential 57th cycle of Enhertu .  The patient understands all the plans discussed today and is in agreement with them.     Tyliah Schlereth DELENA Kerns, MD

## 2024-09-07 ENCOUNTER — Inpatient Hospital Stay: Payer: Self-pay

## 2024-09-07 ENCOUNTER — Inpatient Hospital Stay: Payer: Self-pay | Admitting: Hematology and Oncology

## 2024-09-07 ENCOUNTER — Inpatient Hospital Stay: Payer: Self-pay | Attending: Oncology | Admitting: Oncology

## 2024-09-07 ENCOUNTER — Telehealth: Payer: Self-pay | Admitting: Oncology

## 2024-09-07 ENCOUNTER — Encounter: Payer: Self-pay | Admitting: Oncology

## 2024-09-07 VITALS — BP 124/75 | HR 60 | Temp 97.4°F | Resp 14 | Ht 61.0 in | Wt 150.9 lb

## 2024-09-07 VITALS — BP 134/84 | HR 56 | Temp 98.0°F | Resp 18 | Ht 61.0 in | Wt 150.0 lb

## 2024-09-07 DIAGNOSIS — C50412 Malignant neoplasm of upper-outer quadrant of left female breast: Secondary | ICD-10-CM

## 2024-09-07 DIAGNOSIS — C7931 Secondary malignant neoplasm of brain: Secondary | ICD-10-CM | POA: Insufficient documentation

## 2024-09-07 DIAGNOSIS — C7802 Secondary malignant neoplasm of left lung: Secondary | ICD-10-CM | POA: Insufficient documentation

## 2024-09-07 DIAGNOSIS — C7801 Secondary malignant neoplasm of right lung: Secondary | ICD-10-CM | POA: Insufficient documentation

## 2024-09-07 DIAGNOSIS — Z171 Estrogen receptor negative status [ER-]: Secondary | ICD-10-CM | POA: Insufficient documentation

## 2024-09-07 DIAGNOSIS — Z5112 Encounter for antineoplastic immunotherapy: Secondary | ICD-10-CM | POA: Insufficient documentation

## 2024-09-07 DIAGNOSIS — Z1731 Human epidermal growth factor receptor 2 positive status: Secondary | ICD-10-CM | POA: Insufficient documentation

## 2024-09-07 DIAGNOSIS — Z170 Estrogen receptor positive status [ER+]: Secondary | ICD-10-CM

## 2024-09-07 DIAGNOSIS — R5383 Other fatigue: Secondary | ICD-10-CM | POA: Insufficient documentation

## 2024-09-07 LAB — CMP (CANCER CENTER ONLY)
ALT: 28 U/L (ref 0–44)
AST: 42 U/L — ABNORMAL HIGH (ref 15–41)
Albumin: 3.8 g/dL (ref 3.5–5.0)
Alkaline Phosphatase: 109 U/L (ref 38–126)
Anion gap: 9 (ref 5–15)
BUN: 14 mg/dL (ref 8–23)
CO2: 24 mmol/L (ref 22–32)
Calcium: 9.5 mg/dL (ref 8.9–10.3)
Chloride: 108 mmol/L (ref 98–111)
Creatinine: 0.7 mg/dL (ref 0.44–1.00)
GFR, Estimated: >60 mL/min (ref >60)
Glucose, Bld: 101 mg/dL — ABNORMAL HIGH (ref 70–99)
Potassium: 3.6 mmol/L (ref 3.5–5.1)
Sodium: 141 mmol/L (ref 135–145)
Total Bilirubin: 0.4 mg/dL (ref 0.0–1.2)
Total Protein: 7.3 g/dL (ref 6.5–8.1)

## 2024-09-07 LAB — CBC WITH DIFFERENTIAL (CANCER CENTER ONLY)
Abs Immature Granulocytes: 0.01 K/uL (ref 0.00–0.07)
Basophils Absolute: 0 K/uL (ref 0.0–0.1)
Basophils Relative: 0 %
Eosinophils Absolute: 0.1 K/uL (ref 0.0–0.5)
Eosinophils Relative: 3 %
HCT: 33.5 % — ABNORMAL LOW (ref 36.0–46.0)
Hemoglobin: 11.1 g/dL — ABNORMAL LOW (ref 12.0–15.0)
Immature Granulocytes: 0 %
Lymphocytes Relative: 25 %
Lymphs Abs: 0.6 K/uL — ABNORMAL LOW (ref 0.7–4.0)
MCH: 30 pg (ref 26.0–34.0)
MCHC: 33.1 g/dL (ref 30.0–36.0)
MCV: 90.5 fL (ref 80.0–100.0)
Monocytes Absolute: 0.2 K/uL (ref 0.1–1.0)
Monocytes Relative: 9 %
Neutro Abs: 1.5 K/uL — ABNORMAL LOW (ref 1.7–7.7)
Neutrophils Relative %: 63 %
Platelet Count: 141 K/uL — ABNORMAL LOW (ref 150–400)
RBC: 3.7 MIL/uL — ABNORMAL LOW (ref 3.87–5.11)
RDW: 15.7 % — ABNORMAL HIGH (ref 11.5–15.5)
WBC Count: 2.5 K/uL — ABNORMAL LOW (ref 4.0–10.5)
nRBC: 0 % (ref 0.0–0.2)

## 2024-09-07 MED ORDER — DEXTROSE 5 % IV SOLN: Freq: Once | INTRAVENOUS | Status: DC

## 2024-09-07 MED ORDER — ACETAMINOPHEN 325 MG PO TABS
650.0000 mg | ORAL_TABLET | Freq: Once | ORAL | Status: AC
  Administered 2024-09-07: 650 mg via ORAL
  Filled 2024-09-07: qty 2

## 2024-09-07 MED ORDER — DIPHENHYDRAMINE HCL 25 MG PO CAPS
50.0000 mg | ORAL_CAPSULE | Freq: Once | ORAL | Status: AC
  Administered 2024-09-07: 50 mg via ORAL
  Filled 2024-09-07: qty 2

## 2024-09-07 MED ORDER — FAM-TRASTUZUMAB DERUXTECAN-NXKI CHEMO 100 MG IV SOLR
3.6300 mg/kg | Freq: Once | INTRAVENOUS | Status: AC
  Administered 2024-09-07: 240 mg via INTRAVENOUS
  Filled 2024-09-07: qty 12

## 2024-09-07 MED ORDER — DEXAMETHASONE SOD PHOSPHATE PF 10 MG/ML IJ SOLN
10.0000 mg | Freq: Once | INTRAMUSCULAR | Status: AC
  Administered 2024-09-07: 10 mg via INTRAVENOUS

## 2024-09-07 MED ORDER — SODIUM CHLORIDE 0.9% FLUSH: 10.0000 mL | INTRAVENOUS | Status: DC | PRN

## 2024-09-07 MED ORDER — PALONOSETRON HCL INJECTION 0.25 MG/5ML
0.2500 mg | Freq: Once | INTRAVENOUS | Status: AC
  Administered 2024-09-07: 0.25 mg via INTRAVENOUS
  Filled 2024-09-07: qty 5

## 2024-09-07 NOTE — Telephone Encounter (Signed)
 Transporation request for DOS 09/28/24 has been sent via email to [employer].

## 2024-09-07 NOTE — Patient Instructions (Signed)
 Fam-Trastuzumab  Deruxtecan Injection What is this medication? FAM-TRASTUZUMAB  DERUXTECAN (fam-tras TOOZ eu mab DER ux TEE kan) treats some types of cancer. It works by blocking a protein that causes cancer cells to grow and multiply. This helps to slow or stop the spread of cancer cells. This medicine may be used for other purposes; ask your health care provider or pharmacist if you have questions. COMMON BRAND NAME(S): ENHERTU  What should I tell my care team before I take this medication? They need to know if you have any of these conditions: Heart disease Heart failure Infection, especially a viral infection, such as chickenpox, cold sores, or herpes Liver disease Lung or breathing disease, such as asthma or COPD An unusual or allergic reaction to fam-trastuzumab  deruxtecan, other medications, foods, dyes, or preservatives Pregnant or trying to get pregnant Breast-feeding How should I use this medication? This medication is injected into a vein. It is given by your care team in a hospital or clinic setting. A special MedGuide will be given to you before each treatment. Be sure to read this information carefully each time. Talk to your care team about the use of this medication in children. Special care may be needed. Overdosage: If you think you have taken too much of this medicine contact a poison control center or emergency room at once. NOTE: This medicine is only for you. Do not share this medicine with others. What if I miss a dose? It is important not to miss your dose. Call your care team if you are unable to keep an appointment. What may interact with this medication? Interactions are not expected. This list may not describe all possible interactions. Give your health care provider a list of all the medicines, herbs, non-prescription drugs, or dietary supplements you use. Also tell them if you smoke, drink alcohol , or use illegal drugs. Some items may interact with your  medicine. What should I watch for while using this medication? Visit your care team for regular checks on your progress. Tell your care team if your symptoms do not start to get better or if they get worse. This medication may increase your risk of getting an infection. Call your care team for advice if you get a fever, chills, sore throat, or other symptoms of a cold or flu. Do not treat yourself. Try to avoid being around people who are sick. Avoid taking medications that contain aspirin, acetaminophen , ibuprofen, naproxen, or ketoprofen unless instructed by your care team. These medications may hide a fever. Be careful brushing or flossing your teeth or using a toothpick because you may get an infection or bleed more easily. If you have any dental work done, tell your dentist you are receiving this medication. This medication may cause dry eyes and blurred vision. If you wear contact lenses, you may feel some discomfort. Lubricating eye drops may help. See your care team if the problem does not go away or is severe. Talk to your care team if you may be pregnant. Serious birth defects can occur if you take this medication during pregnancy and for 7 months after the last dose. If your partner can get pregnant, use a condom during sex while taking this medication and for 4 months after the last dose. Do not breastfeed while taking this medication and for 7 months after the last dose. This medication may cause infertility. Talk to your care team if you are concerned about your fertility. What side effects may I notice from receiving this medication? Side effects  that you should report to your care team as soon as possible: Allergic reactions--skin rash, itching, hives, swelling of the face, lips, tongue, or throat Dry cough, shortness of breath or trouble breathing Infection--fever, chills, cough, sore throat, wounds that don't heal, pain or trouble when passing urine, general feeling of discomfort or  being unwell Heart failure--shortness of breath, swelling of the ankles, feet, or hands, sudden weight gain, unusual weakness or fatigue Unusual bruising or bleeding Side effects that usually do not require medical attention (report these to your care team if they continue or are bothersome): Constipation Diarrhea Hair loss Muscle pain Nausea Vomiting This list may not describe all possible side effects. Call your doctor for medical advice about side effects. You may report side effects to FDA at 1-800-FDA-1088. Where should I keep my medication? This medication is given in a hospital or clinic. It will not be stored at home. NOTE: This sheet is a summary. It may not cover all possible information. If you have questions about this medicine, talk to your doctor, pharmacist, or health care provider.  2024 Elsevier/Gold Standard (2023-06-14 00:00:00)

## 2024-09-09 ENCOUNTER — Encounter: Payer: Self-pay | Admitting: Oncology

## 2024-09-09 ENCOUNTER — Other Ambulatory Visit: Payer: Self-pay

## 2024-09-11 ENCOUNTER — Other Ambulatory Visit: Payer: Self-pay

## 2024-09-23 ENCOUNTER — Encounter: Payer: Self-pay | Admitting: Oncology

## 2024-09-27 NOTE — Progress Notes (Unsigned)
 [facility] at [facility] [address] [place],  [place]  [address] [phone]  Clinic Day: 09/28/2024  Referring physician: [facility]   HISTORY OF PRESENT ILLNESS:  The patient is a 73 y.o. 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  She also had a subcarinal lymph node for which scans have consistently shown a complete response to therapy. She comes in today to be evaluated before heading into her 57th cycle of Enhertu .  The patient claims to have tolerated her 56th cycle of treatment fairly well.  She denies having any new symptoms/findings which concern her for disease recurrence.  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  The patient received 6 cycles of Taxotere/Herceptin /Perjeta before being switched to maintenance Perjeta/Herceptin , for which she took 73 cycles of treatment.  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  This happened in the setting of a prolonged treatment break from TDM-1.  Since this recurrence was found, the patient has been on Enhertu  since May 2022.   PHYSICAL EXAM:  Blood pressure 111/78, pulse 83, temperature 97.9 F (36.6 C), temperature source Oral, resp. rate 14, height 5' 1 (1.549 m), weight 151 lb 12.8 oz (68.9 kg), SpO2 96%. Wt Readings from Last 3 Encounters:  09/28/24 151 lb 12.8 oz (68.9 kg)  09/07/24 150 lb (68 kg)  09/07/24 150 lb 14.4 oz (68.4 kg)   Body mass index is 28.68 kg/m. Performance status (ECOG): 1 - Symptomatic but completely ambulatory Physical  Exam Constitutional:      Appearance: Normal appearance. She is not ill-appearing.  HENT:     Mouth/Throat:     Mouth: Mucous membranes are moist.     Pharynx: Oropharynx is clear. No oropharyngeal exudate or posterior oropharyngeal erythema.  Cardiovascular:     Rate and Rhythm: Normal rate and regular rhythm.     Heart sounds: No murmur heard.    No friction rub. No gallop.  Pulmonary:     Effort: Pulmonary effort is normal. No respiratory distress.     Breath sounds: Normal breath sounds. No wheezing, rhonchi or rales.  Abdominal:     General: Bowel sounds are normal. There is no distension.     Palpations: Abdomen is soft. There is no mass.     Tenderness: There is no abdominal tenderness.  Musculoskeletal:        General: No swelling.     Left upper arm: Swelling present.     Left forearm: Swelling present.     Right lower leg: No edema.     Left lower leg: No edema.  Lymphadenopathy:     Cervical: No cervical adenopathy.     Upper Body:     Right upper body: No supraclavicular or axillary adenopathy.     Left upper body: No supraclavicular or axillary adenopathy.     Lower Body: No right inguinal adenopathy. No left inguinal adenopathy.  Skin:    General: Skin is warm.     Coloration: Skin is not jaundiced.     Findings: No lesion or rash.  Neurological:  General: No focal deficit present.     Mental Status: She is alert and oriented to person, place, and time. Mental status is at baseline.  Psychiatric:        Mood and Affect: Mood normal.        Behavior: Behavior normal.        Thought Content: Thought content normal.    LABS:      Latest Ref Rng & Units 09/28/2024    1:46 PM 09/07/2024    1:09 PM 08/17/2024    1:53 PM  CBC  WBC 4.0 - 10.5 K/uL 2.8  2.5  3.4   Hemoglobin 12.0 - 15.0 g/dL 88.7  88.8  88.5   Hematocrit 36.0 - 46.0 % 34.1  33.5  34.9   Platelets 150 - 400 K/uL 149  141  142     Latest Reference Range & Units 09/28/24 13:46  Neutrophils  % 63  Lymphocytes % 23  Monocytes Relative % 10  Eosinophil % 4  Basophil % 0  Immature Granulocytes % 0  NEUT# 1.7 - 7.7 K/uL 1.8      Latest Ref Rng & Units 09/28/2024    1:46 PM 09/07/2024    1:09 PM 08/17/2024    1:53 PM  CMP  Glucose 70 - 99 mg/dL 888  898  892   BUN 8 - 23 mg/dL 12  14  13    Creatinine 0.44 - 1.00 mg/dL 9.22  9.29  9.26   Sodium 135 - 145 mmol/L 141  141  140   Potassium 3.5 - 5.1 mmol/L 3.8  3.6  3.7   Chloride 98 - 111 mmol/L 107  108  108   CO2 22 - 32 mmol/L 23  24  24    Calcium 8.9 - 10.3 mg/dL 9.3  9.5  9.7   Total Protein 6.5 - 8.1 g/dL 7.1  7.3  7.6   Total Bilirubin 0.0 - 1.2 mg/dL 0.5  0.4  0.4   Alkaline Phos 38 - 126 U/L 107  109  114   AST 15 - 41 U/L 47  42  43   ALT 0 - 44 U/L 30  28  31     ASSESSMENT & PLAN:  Assessment/Plan:  A 73 y.o. 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  She will proceed with her 57th cycle of Enhertu  today.  Clinically, the patient looks fine. I will see her back in 3 weeks to reassess her before she heads into her 58th cycle of Enhertu .  The patient understands all the plans discussed today and is in agreement with them.     Ellias Mcelreath DELENA Kerns, MD

## 2024-09-28 ENCOUNTER — Inpatient Hospital Stay: Payer: Self-pay

## 2024-09-28 ENCOUNTER — Inpatient Hospital Stay: Payer: Self-pay | Attending: Oncology | Admitting: Oncology

## 2024-09-28 ENCOUNTER — Telehealth: Payer: Self-pay | Admitting: Oncology

## 2024-09-28 ENCOUNTER — Encounter: Payer: Self-pay | Admitting: Oncology

## 2024-09-28 VITALS — BP 126/70 | HR 63 | Temp 98.0°F | Resp 18

## 2024-09-28 VITALS — BP 111/78 | HR 83 | Temp 97.9°F | Resp 14 | Ht 61.0 in | Wt 151.8 lb

## 2024-09-28 DIAGNOSIS — Z171 Estrogen receptor negative status [ER-]: Secondary | ICD-10-CM

## 2024-09-28 DIAGNOSIS — C7931 Secondary malignant neoplasm of brain: Secondary | ICD-10-CM | POA: Insufficient documentation

## 2024-09-28 DIAGNOSIS — Z5112 Encounter for antineoplastic immunotherapy: Secondary | ICD-10-CM | POA: Insufficient documentation

## 2024-09-28 DIAGNOSIS — C50919 Malignant neoplasm of unspecified site of unspecified female breast: Secondary | ICD-10-CM | POA: Insufficient documentation

## 2024-09-28 DIAGNOSIS — Z1731 Human epidermal growth factor receptor 2 positive status: Secondary | ICD-10-CM | POA: Insufficient documentation

## 2024-09-28 LAB — CMP (CANCER CENTER ONLY)
ALT: 30 U/L (ref 0–44)
AST: 47 U/L — ABNORMAL HIGH (ref 15–41)
Albumin: 3.5 g/dL (ref 3.5–5.0)
Alkaline Phosphatase: 107 U/L (ref 38–126)
Anion gap: 11 (ref 5–15)
BUN: 12 mg/dL (ref 8–23)
CO2: 23 mmol/L (ref 22–32)
Calcium: 9.3 mg/dL (ref 8.9–10.3)
Chloride: 107 mmol/L (ref 98–111)
Creatinine: 0.77 mg/dL (ref 0.44–1.00)
GFR, Estimated: >60 mL/min (ref >60)
Glucose, Bld: 111 mg/dL — ABNORMAL HIGH (ref 70–99)
Potassium: 3.8 mmol/L (ref 3.5–5.1)
Sodium: 141 mmol/L (ref 135–145)
Total Bilirubin: 0.5 mg/dL (ref 0.0–1.2)
Total Protein: 7.1 g/dL (ref 6.5–8.1)

## 2024-09-28 LAB — CBC WITH DIFFERENTIAL (CANCER CENTER ONLY)
Abs Immature Granulocytes: 0 K/uL (ref 0.00–0.07)
Basophils Absolute: 0 K/uL (ref 0.0–0.1)
Basophils Relative: 0 %
Eosinophils Absolute: 0.1 K/uL (ref 0.0–0.5)
Eosinophils Relative: 4 %
HCT: 34.1 % — ABNORMAL LOW (ref 36.0–46.0)
Hemoglobin: 11.2 g/dL — ABNORMAL LOW (ref 12.0–15.0)
Immature Granulocytes: 0 %
Lymphocytes Relative: 23 %
Lymphs Abs: 0.7 K/uL (ref 0.7–4.0)
MCH: 30.2 pg (ref 26.0–34.0)
MCHC: 32.8 g/dL (ref 30.0–36.0)
MCV: 91.9 fL (ref 80.0–100.0)
Monocytes Absolute: 0.3 K/uL (ref 0.1–1.0)
Monocytes Relative: 10 %
Neutro Abs: 1.8 K/uL (ref 1.7–7.7)
Neutrophils Relative %: 63 %
Platelet Count: 149 K/uL — ABNORMAL LOW (ref 150–400)
RBC: 3.71 MIL/uL — ABNORMAL LOW (ref 3.87–5.11)
RDW: 16.4 % — ABNORMAL HIGH (ref 11.5–15.5)
WBC Count: 2.8 K/uL — ABNORMAL LOW (ref 4.0–10.5)
nRBC: 0 % (ref 0.0–0.2)

## 2024-09-28 MED ORDER — FAM-TRASTUZUMAB DERUXTECAN-NXKI CHEMO 100 MG IV SOLR
3.6300 mg/kg | Freq: Once | INTRAVENOUS | Status: AC
  Administered 2024-09-28: 240 mg via INTRAVENOUS
  Filled 2024-09-28: qty 12

## 2024-09-28 MED ORDER — DEXAMETHASONE SOD PHOSPHATE PF 10 MG/ML IJ SOLN
10.0000 mg | Freq: Once | INTRAMUSCULAR | Status: AC
  Administered 2024-09-28: 10 mg via INTRAVENOUS

## 2024-09-28 MED ORDER — DEXTROSE 5 % IV SOLN: Freq: Once | INTRAVENOUS | Status: AC

## 2024-09-28 MED ORDER — DIPHENHYDRAMINE HCL 25 MG PO CAPS
50.0000 mg | ORAL_CAPSULE | Freq: Once | ORAL | Status: AC
  Administered 2024-09-28: 50 mg via ORAL
  Filled 2024-09-28: qty 2

## 2024-09-28 MED ORDER — PALONOSETRON HCL INJECTION 0.25 MG/5ML
0.2500 mg | Freq: Once | INTRAVENOUS | Status: AC
  Administered 2024-09-28: 0.25 mg via INTRAVENOUS
  Filled 2024-09-28: qty 5

## 2024-09-28 MED ORDER — ACETAMINOPHEN 325 MG PO TABS
650.0000 mg | ORAL_TABLET | Freq: Once | ORAL | Status: AC
  Administered 2024-09-28: 650 mg via ORAL
  Filled 2024-09-28: qty 2

## 2024-09-28 NOTE — Telephone Encounter (Signed)
 Patient has been scheduled for follow-up visit per 09/23/2024 LOS.  Pt given an appt calendar with date and time.

## 2024-09-28 NOTE — Patient Instructions (Signed)
 Fam-Trastuzumab  Deruxtecan Injection What is this medication? FAM-TRASTUZUMAB  DERUXTECAN (fam-tras TOOZ eu mab DER ux TEE kan) treats some types of cancer. It works by blocking a protein that causes cancer cells to grow and multiply. This helps to slow or stop the spread of cancer cells. This medicine may be used for other purposes; ask your health care provider or pharmacist if you have questions. COMMON BRAND NAME(S): ENHERTU  What should I tell my care team before I take this medication? They need to know if you have any of these conditions: Heart disease Heart failure Infection, especially a viral infection, such as chickenpox, cold sores, or herpes Liver disease Lung or breathing disease, such as asthma or COPD An unusual or allergic reaction to fam-trastuzumab  deruxtecan, other medications, foods, dyes, or preservatives Pregnant or trying to get pregnant Breast-feeding How should I use this medication? This medication is injected into a vein. It is given by your care team in a hospital or clinic setting. A special MedGuide will be given to you before each treatment. Be sure to read this information carefully each time. Talk to your care team about the use of this medication in children. Special care may be needed. Overdosage: If you think you have taken too much of this medicine contact a poison control center or emergency room at once. NOTE: This medicine is only for you. Do not share this medicine with others. What if I miss a dose? It is important not to miss your dose. Call your care team if you are unable to keep an appointment. What may interact with this medication? Interactions are not expected. This list may not describe all possible interactions. Give your health care provider a list of all the medicines, herbs, non-prescription drugs, or dietary supplements you use. Also tell them if you smoke, drink alcohol , or use illegal drugs. Some items may interact with your  medicine. What should I watch for while using this medication? Visit your care team for regular checks on your progress. Tell your care team if your symptoms do not start to get better or if they get worse. This medication may increase your risk of getting an infection. Call your care team for advice if you get a fever, chills, sore throat, or other symptoms of a cold or flu. Do not treat yourself. Try to avoid being around people who are sick. Avoid taking medications that contain aspirin, acetaminophen , ibuprofen, naproxen, or ketoprofen unless instructed by your care team. These medications may hide a fever. Be careful brushing or flossing your teeth or using a toothpick because you may get an infection or bleed more easily. If you have any dental work done, tell your dentist you are receiving this medication. This medication may cause dry eyes and blurred vision. If you wear contact lenses, you may feel some discomfort. Lubricating eye drops may help. See your care team if the problem does not go away or is severe. Talk to your care team if you may be pregnant. Serious birth defects can occur if you take this medication during pregnancy and for 7 months after the last dose. If your partner can get pregnant, use a condom during sex while taking this medication and for 4 months after the last dose. Do not breastfeed while taking this medication and for 7 months after the last dose. This medication may cause infertility. Talk to your care team if you are concerned about your fertility. What side effects may I notice from receiving this medication? Side effects  that you should report to your care team as soon as possible: Allergic reactions--skin rash, itching, hives, swelling of the face, lips, tongue, or throat Dry cough, shortness of breath or trouble breathing Infection--fever, chills, cough, sore throat, wounds that don't heal, pain or trouble when passing urine, general feeling of discomfort or  being unwell Heart failure--shortness of breath, swelling of the ankles, feet, or hands, sudden weight gain, unusual weakness or fatigue Unusual bruising or bleeding Side effects that usually do not require medical attention (report these to your care team if they continue or are bothersome): Constipation Diarrhea Hair loss Muscle pain Nausea Vomiting This list may not describe all possible side effects. Call your doctor for medical advice about side effects. You may report side effects to FDA at 1-800-FDA-1088. Where should I keep my medication? This medication is given in a hospital or clinic. It will not be stored at home. NOTE: This sheet is a summary. It may not cover all possible information. If you have questions about this medicine, talk to your doctor, pharmacist, or health care provider.  2024 Elsevier/Gold Standard (2023-06-14 00:00:00)

## 2024-09-29 ENCOUNTER — Other Ambulatory Visit: Payer: Self-pay

## 2024-09-29 ENCOUNTER — Ambulatory Visit: Payer: Self-pay | Attending: Oncology

## 2024-09-29 ENCOUNTER — Telehealth: Payer: Self-pay | Admitting: Oncology

## 2024-09-29 DIAGNOSIS — Z0189 Encounter for other specified special examinations: Secondary | ICD-10-CM

## 2024-09-29 DIAGNOSIS — Z171 Estrogen receptor negative status [ER-]: Secondary | ICD-10-CM

## 2024-09-29 DIAGNOSIS — C50412 Malignant neoplasm of upper-outer quadrant of left female breast: Secondary | ICD-10-CM

## 2024-09-29 LAB — ECHOCARDIOGRAM COMPLETE
Area-P 1/2: 3.73 cm2
MV M vel: 4.55 m/s
MV Peak grad: 82.8 mmHg
P 1/2 time: 575 ms
S' Lateral: 2.4 cm

## 2024-09-29 NOTE — Telephone Encounter (Signed)
 09/29/24 Sent transportation request to [place] [facility]for appts on 10/19/24.

## 2024-10-19 ENCOUNTER — Telehealth: Payer: Self-pay | Admitting: Hematology and Oncology

## 2024-10-19 ENCOUNTER — Inpatient Hospital Stay: Payer: Self-pay

## 2024-10-19 ENCOUNTER — Inpatient Hospital Stay: Payer: Self-pay | Admitting: Hematology and Oncology

## 2024-10-19 ENCOUNTER — Encounter: Payer: Self-pay | Admitting: Hematology and Oncology

## 2024-10-19 VITALS — BP 122/77 | HR 81 | Temp 97.6°F | Resp 18 | Ht 61.0 in | Wt 151.0 lb

## 2024-10-19 DIAGNOSIS — C50412 Malignant neoplasm of upper-outer quadrant of left female breast: Secondary | ICD-10-CM

## 2024-10-19 DIAGNOSIS — Z171 Estrogen receptor negative status [ER-]: Secondary | ICD-10-CM

## 2024-10-19 LAB — CMP (CANCER CENTER ONLY)
ALT: 29 U/L (ref 0–44)
AST: 46 U/L — ABNORMAL HIGH (ref 15–41)
Albumin: 3.8 g/dL (ref 3.5–5.0)
Alkaline Phosphatase: 120 U/L (ref 38–126)
Anion gap: 9 (ref 5–15)
BUN: 13 mg/dL (ref 8–23)
CO2: 24 mmol/L (ref 22–32)
Calcium: 9.6 mg/dL (ref 8.9–10.3)
Chloride: 108 mmol/L (ref 98–111)
Creatinine: 1.06 mg/dL — ABNORMAL HIGH (ref 0.44–1.00)
GFR, Estimated: 55 mL/min — ABNORMAL LOW
Glucose, Bld: 122 mg/dL — ABNORMAL HIGH (ref 70–99)
Potassium: 3.8 mmol/L (ref 3.5–5.1)
Sodium: 141 mmol/L (ref 135–145)
Total Bilirubin: 0.4 mg/dL (ref 0.0–1.2)
Total Protein: 7.2 g/dL (ref 6.5–8.1)

## 2024-10-19 LAB — CBC WITH DIFFERENTIAL (CANCER CENTER ONLY)
Abs Immature Granulocytes: 0 K/uL (ref 0.00–0.07)
Basophils Absolute: 0 K/uL (ref 0.0–0.1)
Basophils Relative: 0 %
Eosinophils Absolute: 0.2 K/uL (ref 0.0–0.5)
Eosinophils Relative: 5 %
HCT: 33.9 % — ABNORMAL LOW (ref 36.0–46.0)
Hemoglobin: 11.1 g/dL — ABNORMAL LOW (ref 12.0–15.0)
Immature Granulocytes: 0 %
Lymphocytes Relative: 19 %
Lymphs Abs: 0.6 K/uL — ABNORMAL LOW (ref 0.7–4.0)
MCH: 29.7 pg (ref 26.0–34.0)
MCHC: 32.7 g/dL (ref 30.0–36.0)
MCV: 90.6 fL (ref 80.0–100.0)
Monocytes Absolute: 0.3 K/uL (ref 0.1–1.0)
Monocytes Relative: 10 %
Neutro Abs: 2 K/uL (ref 1.7–7.7)
Neutrophils Relative %: 66 %
Platelet Count: 153 K/uL (ref 150–400)
RBC: 3.74 MIL/uL — ABNORMAL LOW (ref 3.87–5.11)
RDW: 16.1 % — ABNORMAL HIGH (ref 11.5–15.5)
WBC Count: 3 K/uL — ABNORMAL LOW (ref 4.0–10.5)
nRBC: 0 % (ref 0.0–0.2)

## 2024-10-19 NOTE — Telephone Encounter (Signed)
 Patient has been scheduled for follow-up visit per 10/19/2024 LOS.  Pt given an appt calendar with date and time.     Transportation requests have been sent via email to [employer] for the following DOS: 10/27/24 11/16/24

## 2024-10-19 NOTE — Progress Notes (Signed)
 " [facility] at [facility] [address] [place],  [place]  [address] [phone]  Clinic Day: 09/28/2024  Referring physician: [facility]   HISTORY OF PRESENT ILLNESS:  The patient is a 73 y.o. 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  She also had a subcarinal lymph node for which scans have consistently shown a complete response to therapy. She comes in today to be evaluated before heading into her 57th cycle of Enhertu .  The patient claims to have tolerated her 56th cycle of treatment fairly well.  She denies having any new symptoms/findings which concern her for disease recurrence.  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  The patient received 6 cycles of Taxotere/Herceptin /Perjeta before being switched to maintenance Perjeta/Herceptin , for which she took 73 cycles of treatment.  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  This happened in the setting of a prolonged treatment break from TDM-1.  Since this recurrence was found, the patient has been on Enhertu  since May 2022.   PHYSICAL EXAM:  Blood pressure 122/77, pulse 81, temperature 97.6 F (36.4 C), temperature source Oral, resp. rate 18, height 5' 1 (1.549 m), weight 151 lb (68.5 kg), SpO2 97%. Wt Readings from Last 3 Encounters:  10/19/24 151 lb (68.5 kg)  09/28/24 151 lb 12.8 oz (68.9 kg)  09/07/24 150 lb (68 kg)   Body mass index is 28.53 kg/m. Performance status (ECOG): 1 - Symptomatic but completely ambulatory Physical Exam Constitutional:       Appearance: Normal appearance. She is not ill-appearing.  HENT:     Mouth/Throat:     Mouth: Mucous membranes are moist.     Pharynx: Oropharynx is clear. No oropharyngeal exudate or posterior oropharyngeal erythema.  Cardiovascular:     Rate and Rhythm: Normal rate and regular rhythm.     Heart sounds: No murmur heard.    No friction rub. No gallop.  Pulmonary:     Effort: Pulmonary effort is normal. No respiratory distress.     Breath sounds: Normal breath sounds. No wheezing, rhonchi or rales.  Abdominal:     General: Bowel sounds are normal. There is no distension.     Palpations: Abdomen is soft. There is no mass.     Tenderness: There is no abdominal tenderness.  Musculoskeletal:        General: No swelling.     Left upper arm: Swelling present.     Left forearm: Swelling present.     Right lower leg: No edema.     Left lower leg: No edema.  Lymphadenopathy:     Cervical: No cervical adenopathy.     Upper Body:     Right upper body: No supraclavicular or axillary adenopathy.     Left upper body: No supraclavicular or axillary adenopathy.     Lower Body: No right inguinal adenopathy. No left inguinal adenopathy.  Skin:    General: Skin is warm.     Coloration: Skin is not jaundiced.     Findings: No lesion or rash.  Neurological:  General: No focal deficit present.     Mental Status: She is alert and oriented to person, place, and time. Mental status is at baseline.  Psychiatric:        Mood and Affect: Mood normal.        Behavior: Behavior normal.        Thought Content: Thought content normal.    LABS:      Latest Ref Rng & Units 10/19/2024    1:36 PM 09/28/2024    1:46 PM 09/07/2024    1:09 PM  CBC  WBC 4.0 - 10.5 K/uL 3.0  2.8  2.5   Hemoglobin 12.0 - 15.0 g/dL 88.8  88.7  88.8   Hematocrit 36.0 - 46.0 % 33.9  34.1  33.5   Platelets 150 - 400 K/uL 153  149  141     Latest Reference Range & Units 09/28/24 13:46  Neutrophils % 63  Lymphocytes %  23  Monocytes Relative % 10  Eosinophil % 4  Basophil % 0  Immature Granulocytes % 0  NEUT# 1.7 - 7.7 K/uL 1.8      Latest Ref Rng & Units 10/19/2024    1:36 PM 09/28/2024    1:46 PM 09/07/2024    1:09 PM  CMP  Glucose 70 - 99 mg/dL 877  888  898   BUN 8 - 23 mg/dL 13  12  14    Creatinine 0.44 - 1.00 mg/dL 8.93  9.22  9.29   Sodium 135 - 145 mmol/L 141  141  141   Potassium 3.5 - 5.1 mmol/L 3.8  3.8  3.6   Chloride 98 - 111 mmol/L 108  107  108   CO2 22 - 32 mmol/L 24  23  24    Calcium 8.9 - 10.3 mg/dL 9.6  9.3  9.5   Total Protein 6.5 - 8.1 g/dL 7.2  7.1  7.3   Total Bilirubin 0.0 - 1.2 mg/dL 0.4  0.5  0.4   Alkaline Phos 38 - 126 U/L 120  107  109   AST 15 - 41 U/L 46  47  42   ALT 0 - 44 U/L 29  30  28     ASSESSMENT & PLAN:  Assessment/Plan:  A 73 y.o. 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 Clinically, the patient looks fine; however, she would prefer to hold treatment until next week due to continued fatigue and headache from last treatment. CBC and CMP are unremarkable today. We will delay her treatment until next Tuesday.   The patient understands all the plans discussed today and is in agreement with them.     Eleanor DELENA Bach, NP       "

## 2024-10-20 ENCOUNTER — Encounter: Payer: Self-pay | Admitting: Oncology

## 2024-10-20 ENCOUNTER — Other Ambulatory Visit: Payer: Self-pay

## 2024-10-20 ENCOUNTER — Telehealth: Payer: Self-pay

## 2024-10-20 NOTE — Telephone Encounter (Signed)
 PAP enrollment in progress for Enhertu 

## 2024-10-26 ENCOUNTER — Other Ambulatory Visit: Payer: Self-pay | Admitting: Pharmacist

## 2024-10-26 NOTE — Progress Notes (Signed)
 Ok to use labs from 10/19/24 for treatment on 10/27/24 per Dr. Ezzard.

## 2024-10-27 ENCOUNTER — Other Ambulatory Visit: Payer: Self-pay | Admitting: Oncology

## 2024-10-27 ENCOUNTER — Inpatient Hospital Stay: Payer: Self-pay

## 2024-10-27 VITALS — BP 135/79 | HR 71 | Temp 97.8°F | Resp 20 | Ht 61.0 in | Wt 151.0 lb

## 2024-10-27 DIAGNOSIS — Z171 Estrogen receptor negative status [ER-]: Secondary | ICD-10-CM

## 2024-10-27 MED ORDER — DEXTROSE 5 % IV SOLN: Freq: Once | INTRAVENOUS | Status: AC

## 2024-10-27 MED ORDER — FAM-TRASTUZUMAB DERUXTECAN-NXKI CHEMO 100 MG IV SOLR
3.6300 mg/kg | Freq: Once | INTRAVENOUS | Status: AC
  Administered 2024-10-27: 240 mg via INTRAVENOUS
  Filled 2024-10-27: qty 12

## 2024-10-27 MED ORDER — ACETAMINOPHEN 325 MG PO TABS
650.0000 mg | ORAL_TABLET | Freq: Once | ORAL | Status: AC
  Administered 2024-10-27: 650 mg via ORAL
  Filled 2024-10-27: qty 2

## 2024-10-27 MED ORDER — DEXAMETHASONE SOD PHOSPHATE PF 10 MG/ML IJ SOLN
10.0000 mg | Freq: Once | INTRAMUSCULAR | Status: AC
  Administered 2024-10-27: 10 mg via INTRAVENOUS

## 2024-10-27 MED ORDER — DIPHENHYDRAMINE HCL 25 MG PO TABS
50.0000 mg | ORAL_TABLET | Freq: Once | ORAL | Status: AC
  Administered 2024-10-27: 50 mg via ORAL
  Filled 2024-10-27: qty 2

## 2024-10-27 MED ORDER — PALONOSETRON HCL INJECTION 0.25 MG/5ML
0.2500 mg | Freq: Once | INTRAVENOUS | Status: AC
  Administered 2024-10-27: 0.25 mg via INTRAVENOUS
  Filled 2024-10-27: qty 5

## 2024-10-27 NOTE — Patient Instructions (Signed)
 Fam-Trastuzumab  Deruxtecan Injection Qu es este medicamento? El FAM-TRASTUZUMAB  DERUXTECAN trata algunos tipos de cncer. Acta bloqueando una protena que hace que las clulas cancerosas crezcan y se multipliquen. Esto ayuda a Neurosurgeon propagacin de las clulas cancerosas. Este medicamento puede ser utilizado para otros usos; si tiene alguna pregunta consulte con su proveedor de atencin mdica o con su farmacutico. MARCAS COMUNES: ENHERTU  Qu le debo informar a mi profesional de la salud antes de tomar este medicamento? Necesitan saber si usted presenta alguno de los [employer] o situaciones: Enfermedad cardiaca Insuficiencia cardiaca Infeccin, especialmente infecciones virales, tales como varicela, fuegos labiales o herpes Enfermedad heptica Enfermedad pulmonar o respiratoria, tales como asma o EPOC Una reaccin alrgica o inusual al fam-trastuzumab  deruxtecan, a otros medicamentos, alimentos, colorantes o conservantes Si est embarazada o buscando quedar embarazada Si est amamantando a un beb Cmo debo utilizar este medicamento? Este medicamento se inyecta en una vena. Su equipo de atencin lo india en un hospital o en un entorno clnico. Se le entregar una Gua del medicamento (MedGuide, su nombre en ingls) especial antes de cada tratamiento. Asegrese de leer esta informacin cada vez cuidadosamente. Hable con su equipo de atencin sobre el uso de este medicamento en nios. Puede requerir atencin especial. Sobredosis: Pngase en contacto inmediatamente con un centro toxicolgico o una sala de urgencia si usted cree que haya tomado demasiado medicamento.<br>ATENCIN: [employer] es solo para usted. No comparta este medicamento con nadie. Qu sucede si me olvido de una dosis? Es importante no olvidar ninguna dosis. Llame a su equipo de atencin si no puede asistir a una cita. Qu puede interactuar con este medicamento? No se anticipan  interacciones. Puede ser que esta lista no menciona todas las posibles interacciones. Informe a su profesional de [employer] de [employer] productos a base de hierbas, medicamentos de [place] o suplementos nutritivos que est tomando. Si usted fuma, consume bebidas alcohlicas o si utiliza drogas ilegales, indqueselo tambin a su profesional de [employer]. Algunas sustancias pueden interactuar con su medicamento. A qu debo estar atento al usar [employer]? Visite a su equipo de atencin para que revise su evolucin peridicamente. Informe a su equipo de atencin si los sntomas no comienzan a mejorar o si empeoran. Este medicamento puede aumentar su riesgo de contraer una infeccin. Llame para pedir consejo a su equipo de atencin si tiene fiebre, escalofros, dolor de garganta o cualquier otro sntoma de resfriado o gripe. No se trate usted mismo. Trate de no acercarse a personas que estn enfermas. Evite usar medicamentos que contengan aspirina, acetaminofeno, ibuprofeno, naproxeno o ketoprofeno, a menos que as lo indique su equipo de atencin. Estos medicamentos pueden ocultar la fiebre. Proceda con cuidado al cepillar sus dientes, o al usar hilo dental o palillos para los dientes, ya que podra contraer una infeccin o Geophysicist/field seismologist con mayor facilidad. Si recibe algn tratamiento dental, informe a su dentista que est usando este medicamento. Este medicamento puede resecarle los ojos y provocar visin borrosa. Si usa  lentes de contacto, podra sentir ciertas molestias. Las gotas lubricantes para los ojos pueden ser tiles. Si el problema no desaparece o es grave, visite a su equipo de atencin. Hable con su equipo de atencin si podra estar en embarazo. Este medicamento puede causar defectos congnitos graves si se usa  durante el embarazo y por 7 meses despus de la ltima dosis. Si su pareja puede quedar embarazada, use un condn [employer] sexuales mientras est usando este medicamento  y  por 4 meses despus de la ltima dosis. No debe amamantar a un beb mientras usa  este medicamento y por 7 meses despus de la ltima dosis. Este medicamento podra causar infertilidad. Hable con su equipo de atencin si le preocupa su fertilidad. Qu efectos secundarios puedo tener al [employer] este medicamento? Efectos secundarios que debe informar a su equipo de atencin tan pronto como sea posible: Reacciones alrgicas: erupcin cutnea, comezn/picazn, urticaria, hinchazn de la cara, los labios, la lengua o la garganta Tos seca, falta de aire o problemas para respirar Infeccin: fiebre, escalofros, tos, dolor de garganta, heridas que no sanan, dolor o problemas para Geographical information systems officer, sensacin general de molestia o Sports administrator cardiaca: falta de aire, hinchazn de los tobillos, los pies o las [place], aumento de peso repentino, debilidad o fatiga inusuales Sangrado o moretones inusuales Efectos secundarios que generalmente no requieren atencin mdica (debe informarlos a su equipo de atencin si persisten o si son molestos): Estreimiento Diarrea Cada del cabello Dolor muscular Nuseas Vmito Puede ser que esta lista no menciona todos los posibles efectos secundarios. Comunquese a su mdico por asesoramiento mdico [employer]. Usted puede informar los efectos secundarios a la FDA por telfono al 1-800-FDA-1088. Dnde debo guardar mi medicina? Este medicamento se administra en hospitales o clnicas. No se guarda en su casa. ATENCIN: Este folleto es un resumen. Puede ser que no cubra toda la posible informacin. Si usted tiene preguntas acerca de esta medicina, consulte con su mdico, su farmacutico o su profesional de Radiographer, therapeutic.  2024 Elsevier/Gold Standard (2023-08-12 00:00:00)

## 2024-11-09 ENCOUNTER — Inpatient Hospital Stay: Payer: Self-pay | Admitting: Oncology

## 2024-11-09 ENCOUNTER — Inpatient Hospital Stay: Payer: Self-pay

## 2024-11-10 NOTE — Telephone Encounter (Signed)
 Patient is receiving Assistance Medication - Supplied Externally. Medication: Enhertu  Manufacturer: [employer] Approval Dates: Approved from 11/09/2024 until 10/28/2025. ID: [id number] Reason: Self-pay First DOS: 11/16/2024

## 2024-11-16 ENCOUNTER — Inpatient Hospital Stay: Payer: Self-pay

## 2024-11-16 ENCOUNTER — Inpatient Hospital Stay (HOSPITAL_BASED_OUTPATIENT_CLINIC_OR_DEPARTMENT_OTHER): Payer: Self-pay | Admitting: Oncology

## 2024-11-16 ENCOUNTER — Inpatient Hospital Stay: Payer: Self-pay | Attending: Oncology

## 2024-11-16 ENCOUNTER — Other Ambulatory Visit: Payer: Self-pay | Admitting: Oncology

## 2024-11-16 DIAGNOSIS — Z171 Estrogen receptor negative status [ER-]: Secondary | ICD-10-CM

## 2024-11-16 DIAGNOSIS — C50412 Malignant neoplasm of upper-outer quadrant of left female breast: Secondary | ICD-10-CM

## 2024-11-16 DIAGNOSIS — C7802 Secondary malignant neoplasm of left lung: Secondary | ICD-10-CM | POA: Insufficient documentation

## 2024-11-16 DIAGNOSIS — Z5112 Encounter for antineoplastic immunotherapy: Secondary | ICD-10-CM | POA: Insufficient documentation

## 2024-11-16 DIAGNOSIS — E611 Iron deficiency: Secondary | ICD-10-CM | POA: Insufficient documentation

## 2024-11-16 DIAGNOSIS — C7931 Secondary malignant neoplasm of brain: Secondary | ICD-10-CM | POA: Insufficient documentation

## 2024-11-16 DIAGNOSIS — Z1731 Human epidermal growth factor receptor 2 positive status: Secondary | ICD-10-CM | POA: Insufficient documentation

## 2024-11-16 DIAGNOSIS — C7801 Secondary malignant neoplasm of right lung: Secondary | ICD-10-CM | POA: Insufficient documentation

## 2024-11-16 DIAGNOSIS — C50919 Malignant neoplasm of unspecified site of unspecified female breast: Secondary | ICD-10-CM | POA: Insufficient documentation

## 2024-11-16 LAB — CMP (CANCER CENTER ONLY)
ALT: 26 U/L (ref 0–44)
AST: 46 U/L — ABNORMAL HIGH (ref 15–41)
Albumin: 3.5 g/dL (ref 3.5–5.0)
Alkaline Phosphatase: 112 U/L (ref 38–126)
Anion gap: 9 (ref 5–15)
BUN: 14 mg/dL (ref 8–23)
CO2: 23 mmol/L (ref 22–32)
Calcium: 9.5 mg/dL (ref 8.9–10.3)
Chloride: 109 mmol/L (ref 98–111)
Creatinine: 0.79 mg/dL (ref 0.44–1.00)
GFR, Estimated: >60 mL/min
Glucose, Bld: 119 mg/dL — ABNORMAL HIGH (ref 70–99)
Potassium: 3.6 mmol/L (ref 3.5–5.1)
Sodium: 142 mmol/L (ref 135–145)
Total Bilirubin: 0.4 mg/dL (ref 0.0–1.2)
Total Protein: 7.4 g/dL (ref 6.5–8.1)

## 2024-11-16 LAB — IRON AND TIBC
Iron: 32 ug/dL (ref 28–170)
Saturation Ratios: 10 % — ABNORMAL LOW (ref 10.4–31.8)
TIBC: 323 ug/dL (ref 250–450)
UIBC: 291 ug/dL

## 2024-11-16 LAB — CBC WITH DIFFERENTIAL (CANCER CENTER ONLY)
Abs Immature Granulocytes: 0.01 K/uL (ref 0.00–0.07)
Basophils Absolute: 0 K/uL (ref 0.0–0.1)
Basophils Relative: 0 %
Eosinophils Absolute: 0.2 K/uL (ref 0.0–0.5)
Eosinophils Relative: 5 %
HCT: 32.6 % — ABNORMAL LOW (ref 36.0–46.0)
Hemoglobin: 10.8 g/dL — ABNORMAL LOW (ref 12.0–15.0)
Immature Granulocytes: 0 %
Lymphocytes Relative: 10 %
Lymphs Abs: 0.3 K/uL — ABNORMAL LOW (ref 0.7–4.0)
MCH: 29 pg (ref 26.0–34.0)
MCHC: 33.1 g/dL (ref 30.0–36.0)
MCV: 87.6 fL (ref 80.0–100.0)
Monocytes Absolute: 0.3 K/uL (ref 0.1–1.0)
Monocytes Relative: 10 %
Neutro Abs: 2.5 K/uL (ref 1.7–7.7)
Neutrophils Relative %: 75 %
Platelet Count: 156 K/uL (ref 150–400)
RBC: 3.72 MIL/uL — ABNORMAL LOW (ref 3.87–5.11)
RDW: 15.8 % — ABNORMAL HIGH (ref 11.5–15.5)
WBC Count: 3.4 K/uL — ABNORMAL LOW (ref 4.0–10.5)
nRBC: 0 % (ref 0.0–0.2)

## 2024-11-16 LAB — FERRITIN: Ferritin: 33 ng/mL (ref 11–307)

## 2024-11-16 MED ORDER — FAM-TRASTUZUMAB DERUXTECAN-NXKI CHEMO 100 MG IV SOLR
3.6300 mg/kg | Freq: Once | INTRAVENOUS | Status: AC
  Administered 2024-11-16: 240 mg via INTRAVENOUS
  Filled 2024-11-16: qty 12

## 2024-11-16 MED ORDER — PALONOSETRON HCL INJECTION 0.25 MG/5ML
0.2500 mg | Freq: Once | INTRAVENOUS | Status: AC
  Administered 2024-11-16: 0.25 mg via INTRAVENOUS
  Filled 2024-11-16: qty 5

## 2024-11-16 MED ORDER — DEXTROSE 5 % IV SOLN: Freq: Once | INTRAVENOUS | Status: AC

## 2024-11-16 MED ORDER — DIPHENHYDRAMINE HCL 25 MG PO TABS
50.0000 mg | ORAL_TABLET | Freq: Once | ORAL | Status: AC
  Administered 2024-11-16: 50 mg via ORAL
  Filled 2024-11-16: qty 2

## 2024-11-16 MED ORDER — DEXAMETHASONE SOD PHOSPHATE PF 10 MG/ML IJ SOLN
10.0000 mg | Freq: Once | INTRAMUSCULAR | Status: AC
  Administered 2024-11-16: 10 mg via INTRAVENOUS
  Filled 2024-11-16: qty 1

## 2024-11-16 MED ORDER — ACETAMINOPHEN 325 MG PO TABS
650.0000 mg | ORAL_TABLET | Freq: Once | ORAL | Status: AC
  Administered 2024-11-16: 650 mg via ORAL
  Filled 2024-11-16: qty 2

## 2024-11-16 NOTE — Progress Notes (Signed)
 " [facility] at [facility] [address] [place],  [place]  [address] [phone]  Clinic Day: 11/16/2024  Referring physician: [facility]   HISTORY OF PRESENT ILLNESS:  The patient is a 74 y.o. 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  She also had a subcarinal lymph node for which scans have consistently shown a complete response to therapy. She comes in today to be evaluated before heading into her 59th cycle of Enhertu .  The patient claims to have tolerated her 58th cycle of treatment fairly well.  She denies having any new symptoms/findings which concern her for disease recurrence.  However, she has had increased fatigue.  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  The patient received 6 cycles of Taxotere/Herceptin /Perjeta before being switched to maintenance Perjeta/Herceptin , for which she took 73 cycles of treatment.  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  This happened in the setting of a prolonged treatment break from TDM-1.  Since this recurrence was found, the patient has been on Enhertu  since May 2022.   PHYSICAL EXAM:  Blood pressure 118/74, pulse 88, temperature 98.1 F (36.7 C), temperature source Oral, resp. rate 14, height 5' 1 (1.549 m), weight 150 lb 8 oz (68.3 kg), SpO2 95%. Wt Readings from Last 3 Encounters:  11/16/24 150 lb 8 oz (68.3 kg)  10/27/24 151 lb 0.6 oz (68.5 kg)  10/19/24 151 lb (68.5 kg)   Body mass index is 28.44 kg/m. Performance status (ECOG): 1 - Symptomatic but  completely ambulatory Physical Exam Constitutional:      Appearance: Normal appearance. She is not ill-appearing.  HENT:     Mouth/Throat:     Mouth: Mucous membranes are moist.     Pharynx: Oropharynx is clear. No oropharyngeal exudate or posterior oropharyngeal erythema.  Cardiovascular:     Rate and Rhythm: Normal rate and regular rhythm.     Heart sounds: No murmur heard.    No friction rub. No gallop.  Pulmonary:     Effort: Pulmonary effort is normal. No respiratory distress.     Breath sounds: Normal breath sounds. No wheezing, rhonchi or rales.  Abdominal:     General: Bowel sounds are normal. There is no distension.     Palpations: Abdomen is soft. There is no mass.     Tenderness: There is no abdominal tenderness.  Musculoskeletal:        General: No swelling.     Left upper arm: Swelling present.     Left forearm: Swelling present.     Right lower leg: No edema.     Left lower leg: No edema.  Lymphadenopathy:     Cervical: No cervical adenopathy.     Upper Body:     Right upper body: No supraclavicular or axillary adenopathy.     Left upper body: No supraclavicular or axillary adenopathy.     Lower Body: No right inguinal adenopathy. No left inguinal adenopathy.  Skin:    General: Skin is warm.     Coloration: Skin is not jaundiced.  Findings: No lesion or rash.  Neurological:     General: No focal deficit present.     Mental Status: She is alert and oriented to person, place, and time. Mental status is at baseline.  Psychiatric:        Mood and Affect: Mood normal.        Behavior: Behavior normal.        Thought Content: Thought content normal.    LABS:      Latest Ref Rng & Units 11/16/2024    1:42 PM 10/19/2024    1:36 PM 09/28/2024    1:46 PM  CBC  WBC 4.0 - 10.5 K/uL 3.4  3.0  2.8   Hemoglobin 12.0 - 15.0 g/dL 89.1  88.8  88.7   Hematocrit 36.0 - 46.0 % 32.6  33.9  34.1   Platelets 150 - 400 K/uL 156  153  149       Latest Ref Rng & Units  11/16/2024    1:42 PM 10/19/2024    1:36 PM 09/28/2024    1:46 PM  CMP  Glucose 70 - 99 mg/dL 880  877  888   BUN 8 - 23 mg/dL 14  13  12    Creatinine 0.44 - 1.00 mg/dL 9.20  8.93  9.22   Sodium 135 - 145 mmol/L 142  141  141   Potassium 3.5 - 5.1 mmol/L 3.6  3.8  3.8   Chloride 98 - 111 mmol/L 109  108  107   CO2 22 - 32 mmol/L 23  24  23    Calcium 8.9 - 10.3 mg/dL 9.5  9.6  9.3   Total Protein 6.5 - 8.1 g/dL 7.4  7.2  7.1   Total Bilirubin 0.0 - 1.2 mg/dL 0.4  0.4  0.5   Alkaline Phos 38 - 126 U/L 112  120  107   AST 15 - 41 U/L 46  46  47   ALT 0 - 44 U/L 26  29  30      Latest Reference Range & Units 11/16/24 13:42  Iron 28 - 170 ug/dL 32  UIBC ug/dL 708  TIBC 749 - 549 ug/dL 676  Saturation Ratios 10.4 - 31.8 % 10 (L)  Ferritin 11 - 307 ng/mL 33  (L): Data is abnormally low  ASSESSMENT & PLAN:  Assessment/Plan:  A 74 y.o. 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  She will proceed with her 59th cycle of Enhertu  today.  As her hemoglobin is mildly low, her iron parameters were checked today, which are trending downwards.  Based upon this, I will arrange for her to receive IV iron over these next few weeks to replenish her iron stores and improve her hemoglobin.  Overall, the patient appears to be doing fairly well.  I will see her back in 3 weeks before she heads into her 60th cycle of Enhertu .  The patient understands all the plans discussed today and is in agreement with them.     Bracken Moffa DELENA Kerns, MD       "

## 2024-11-16 NOTE — Patient Instructions (Signed)
 Fam-Trastuzumab  Deruxtecan Injection Qu es este medicamento? El FAM-TRASTUZUMAB  DERUXTECAN trata algunos tipos de cncer. Acta bloqueando una protena que hace que las clulas cancerosas crezcan y se multipliquen. Esto ayuda a Neurosurgeon propagacin de las clulas cancerosas. Este medicamento puede ser utilizado para otros usos; si tiene alguna pregunta consulte con su proveedor de atencin mdica o con su farmacutico. MARCAS COMUNES: ENHERTU  Qu le debo informar a mi profesional de la salud antes de tomar este medicamento? Necesitan saber si usted presenta alguno de los [employer] o situaciones: Enfermedad cardiaca Insuficiencia cardiaca Infeccin, especialmente infecciones virales, tales como varicela, fuegos labiales o herpes Enfermedad heptica Enfermedad pulmonar o respiratoria, tales como asma o EPOC Una reaccin alrgica o inusual al fam-trastuzumab  deruxtecan, a otros medicamentos, alimentos, colorantes o conservantes Si est embarazada o buscando quedar embarazada Si est amamantando a un beb Cmo debo utilizar este medicamento? Este medicamento se inyecta en una vena. Su equipo de atencin lo india en un hospital o en un entorno clnico. Se le entregar una Gua del medicamento (MedGuide, su nombre en ingls) especial antes de cada tratamiento. Asegrese de leer esta informacin cada vez cuidadosamente. Hable con su equipo de atencin sobre el uso de este medicamento en nios. Puede requerir atencin especial. Sobredosis: Pngase en contacto inmediatamente con un centro toxicolgico o una sala de urgencia si usted cree que haya tomado demasiado medicamento.<br>ATENCIN: [employer] es solo para usted. No comparta este medicamento con nadie. Qu sucede si me olvido de una dosis? Es importante no olvidar ninguna dosis. Llame a su equipo de atencin si no puede asistir a una cita. Qu puede interactuar con este medicamento? No se anticipan  interacciones. Puede ser que esta lista no menciona todas las posibles interacciones. Informe a su profesional de [employer] de [employer] productos a base de hierbas, medicamentos de [place] o suplementos nutritivos que est tomando. Si usted fuma, consume bebidas alcohlicas o si utiliza drogas ilegales, indqueselo tambin a su profesional de [employer]. Algunas sustancias pueden interactuar con su medicamento. A qu debo estar atento al usar [employer]? Visite a su equipo de atencin para que revise su evolucin peridicamente. Informe a su equipo de atencin si los sntomas no comienzan a mejorar o si empeoran. Este medicamento puede aumentar su riesgo de contraer una infeccin. Llame para pedir consejo a su equipo de atencin si tiene fiebre, escalofros, dolor de garganta o cualquier otro sntoma de resfriado o gripe. No se trate usted mismo. Trate de no acercarse a personas que estn enfermas. Evite usar medicamentos que contengan aspirina, acetaminofeno, ibuprofeno, naproxeno o ketoprofeno, a menos que as lo indique su equipo de atencin. Estos medicamentos pueden ocultar la fiebre. Proceda con cuidado al cepillar sus dientes, o al usar hilo dental o palillos para los dientes, ya que podra contraer una infeccin o Geophysicist/field seismologist con mayor facilidad. Si recibe algn tratamiento dental, informe a su dentista que est usando este medicamento. Este medicamento puede resecarle los ojos y provocar visin borrosa. Si usa  lentes de contacto, podra sentir ciertas molestias. Las gotas lubricantes para los ojos pueden ser tiles. Si el problema no desaparece o es grave, visite a su equipo de atencin. Hable con su equipo de atencin si podra estar en embarazo. Este medicamento puede causar defectos congnitos graves si se usa  durante el embarazo y por 7 meses despus de la ltima dosis. Si su pareja puede quedar embarazada, use un condn [employer] sexuales mientras est usando este medicamento  y  por 4 meses despus de la ltima dosis. No debe amamantar a un beb mientras usa  este medicamento y por 7 meses despus de la ltima dosis. Este medicamento podra causar infertilidad. Hable con su equipo de atencin si le preocupa su fertilidad. Qu efectos secundarios puedo tener al [employer] este medicamento? Efectos secundarios que debe informar a su equipo de atencin tan pronto como sea posible: Reacciones alrgicas: erupcin cutnea, comezn/picazn, urticaria, hinchazn de la cara, los labios, la lengua o la garganta Tos seca, falta de aire o problemas para respirar Infeccin: fiebre, escalofros, tos, dolor de garganta, heridas que no sanan, dolor o problemas para Geographical information systems officer, sensacin general de molestia o Sports administrator cardiaca: falta de aire, hinchazn de los tobillos, los pies o las [place], aumento de peso repentino, debilidad o fatiga inusuales Sangrado o moretones inusuales Efectos secundarios que generalmente no requieren atencin mdica (debe informarlos a su equipo de atencin si persisten o si son molestos): Estreimiento Diarrea Cada del cabello Dolor muscular Nuseas Vmito Puede ser que esta lista no menciona todos los posibles efectos secundarios. Comunquese a su mdico por asesoramiento mdico [employer]. Usted puede informar los efectos secundarios a la FDA por telfono al 1-800-FDA-1088. Dnde debo guardar mi medicina? Este medicamento se administra en hospitales o clnicas. No se guarda en su casa. ATENCIN: Este folleto es un resumen. Puede ser que no cubra toda la posible informacin. Si usted tiene preguntas acerca de esta medicina, consulte con su mdico, su farmacutico o su profesional de Radiographer, therapeutic.  2024 Elsevier/Gold Standard (2023-08-12 00:00:00)

## 2024-11-17 ENCOUNTER — Telehealth: Payer: Self-pay

## 2024-11-17 ENCOUNTER — Encounter: Payer: Self-pay | Admitting: Oncology

## 2024-11-17 ENCOUNTER — Other Ambulatory Visit: Payer: Self-pay

## 2024-11-17 NOTE — Telephone Encounter (Signed)
 Dr Ezzard: let pt know her iron levels are trending lower; please arrange for IV iron to be given over these next few weeks.

## 2024-11-18 ENCOUNTER — Other Ambulatory Visit: Payer: Self-pay

## 2024-11-19 ENCOUNTER — Inpatient Hospital Stay: Payer: Self-pay

## 2024-11-19 ENCOUNTER — Telehealth: Payer: Self-pay

## 2024-11-19 NOTE — Telephone Encounter (Signed)
 Patient is receiving Replacement Medication. Medication: Injectafer   Manufacturer: [employer] Approval Dates: Approved from 11/19/2024 until 09/30/2025. ID: [id number] Reason: Self-pay First DOS: 12/02/2024

## 2024-11-19 NOTE — Telephone Encounter (Signed)
 PAP in progress for Injectafer 

## 2024-11-29 ENCOUNTER — Other Ambulatory Visit: Payer: Self-pay

## 2024-12-02 ENCOUNTER — Telehealth: Payer: Self-pay | Admitting: Oncology

## 2024-12-02 ENCOUNTER — Inpatient Hospital Stay: Payer: Self-pay

## 2024-12-02 NOTE — Telephone Encounter (Signed)
 12/02/24 Sent request for Transportation to [facility]for appts on 12/07/24.

## 2024-12-07 ENCOUNTER — Inpatient Hospital Stay: Payer: Self-pay

## 2024-12-07 ENCOUNTER — Inpatient Hospital Stay: Payer: Self-pay | Admitting: Oncology

## 2024-12-07 ENCOUNTER — Inpatient Hospital Stay: Payer: Self-pay | Attending: Oncology

## 2024-12-14 ENCOUNTER — Inpatient Hospital Stay: Payer: Self-pay
